# Patient Record
Sex: Female | Born: 1965
Health system: Southern US, Community
[De-identification: ages and names within clinical notes are randomized; demographics above are authoritative.]

## PROBLEM LIST (undated history)

## (undated) DIAGNOSIS — R079 Chest pain, unspecified: Secondary | ICD-10-CM

## (undated) DIAGNOSIS — M722 Plantar fascial fibromatosis: Secondary | ICD-10-CM

## (undated) DIAGNOSIS — N8110 Cystocele, unspecified: Secondary | ICD-10-CM

## (undated) DIAGNOSIS — R011 Cardiac murmur, unspecified: Secondary | ICD-10-CM

## (undated) DIAGNOSIS — G8929 Other chronic pain: Secondary | ICD-10-CM

## (undated) DIAGNOSIS — M549 Dorsalgia, unspecified: Secondary | ICD-10-CM

## (undated) DIAGNOSIS — I10 Essential (primary) hypertension: Secondary | ICD-10-CM

## (undated) DIAGNOSIS — K219 Gastro-esophageal reflux disease without esophagitis: Secondary | ICD-10-CM

## (undated) DIAGNOSIS — N393 Stress incontinence (female) (male): Secondary | ICD-10-CM

## (undated) DIAGNOSIS — F329 Major depressive disorder, single episode, unspecified: Secondary | ICD-10-CM

## (undated) DIAGNOSIS — F419 Anxiety disorder, unspecified: Secondary | ICD-10-CM

## (undated) DIAGNOSIS — D219 Benign neoplasm of connective and other soft tissue, unspecified: Secondary | ICD-10-CM

## (undated) DIAGNOSIS — E876 Hypokalemia: Secondary | ICD-10-CM

## (undated) DIAGNOSIS — Z87898 Personal history of other specified conditions: Secondary | ICD-10-CM

## (undated) DIAGNOSIS — Z5189 Encounter for other specified aftercare: Secondary | ICD-10-CM

## (undated) DIAGNOSIS — R51 Headache: Secondary | ICD-10-CM

## (undated) DIAGNOSIS — Z973 Presence of spectacles and contact lenses: Secondary | ICD-10-CM

## (undated) DIAGNOSIS — N816 Rectocele: Secondary | ICD-10-CM

## (undated) DIAGNOSIS — G4730 Sleep apnea, unspecified: Secondary | ICD-10-CM

## (undated) DIAGNOSIS — M1990 Unspecified osteoarthritis, unspecified site: Secondary | ICD-10-CM

## (undated) DIAGNOSIS — E559 Vitamin D deficiency, unspecified: Principal | ICD-10-CM

## (undated) HISTORY — DX: Anxiety disorder, unspecified: F41.9

## (undated) HISTORY — PX: KNEE SURGERY: SHX244

## (undated) HISTORY — DX: Chest pain, unspecified: R07.9

## (undated) HISTORY — DX: Major depressive disorder, single episode, unspecified: F32.9

## (undated) HISTORY — PX: OTHER SURGICAL HISTORY: SHX169

## (undated) HISTORY — PX: BILATERAL OOPHORECTOMY: SHX1221

## (undated) HISTORY — DX: Encounter for other specified aftercare: Z51.89

## (undated) HISTORY — DX: Benign neoplasm of connective and other soft tissue, unspecified: D21.9

## (undated) HISTORY — PX: HERNIA REPAIR: SHX51

## (undated) HISTORY — DX: Vitamin D deficiency, unspecified: E55.9

---

## 1988-03-03 HISTORY — PX: TUBAL LIGATION: SHX77

## 2000-03-03 HISTORY — PX: UMBILICAL HERNIA REPAIR: SHX196

## 2002-03-03 HISTORY — PX: LAPAROSCOPIC OOPHORECTOMY: SUR783

## 2005-02-19 ENCOUNTER — Inpatient Hospital Stay (HOSPITAL_COMMUNITY): Admission: AD | Admit: 2005-02-19 | Discharge: 2005-02-20 | Payer: Self-pay | Admitting: Obstetrics and Gynecology

## 2005-09-26 ENCOUNTER — Emergency Department (HOSPITAL_COMMUNITY): Admission: EM | Admit: 2005-09-26 | Discharge: 2005-09-26 | Payer: Self-pay | Admitting: Emergency Medicine

## 2006-06-16 ENCOUNTER — Encounter: Admission: RE | Admit: 2006-06-16 | Discharge: 2006-06-16 | Payer: Self-pay | Admitting: Obstetrics and Gynecology

## 2006-07-22 ENCOUNTER — Inpatient Hospital Stay (HOSPITAL_COMMUNITY): Admission: AD | Admit: 2006-07-22 | Discharge: 2006-07-22 | Payer: Self-pay | Admitting: Obstetrics and Gynecology

## 2007-08-16 ENCOUNTER — Emergency Department (HOSPITAL_COMMUNITY): Admission: EM | Admit: 2007-08-16 | Discharge: 2007-08-16 | Payer: Self-pay | Admitting: Emergency Medicine

## 2008-03-03 HISTORY — PX: KNEE ARTHROSCOPY: SUR90

## 2008-12-09 ENCOUNTER — Emergency Department (HOSPITAL_COMMUNITY): Admission: EM | Admit: 2008-12-09 | Discharge: 2008-12-09 | Payer: Self-pay | Admitting: Emergency Medicine

## 2009-01-29 ENCOUNTER — Emergency Department (HOSPITAL_COMMUNITY): Admission: EM | Admit: 2009-01-29 | Discharge: 2009-01-29 | Payer: Self-pay | Admitting: Family Medicine

## 2009-06-06 ENCOUNTER — Emergency Department (HOSPITAL_COMMUNITY): Admission: EM | Admit: 2009-06-06 | Discharge: 2009-06-07 | Payer: Self-pay | Admitting: Emergency Medicine

## 2009-12-21 ENCOUNTER — Emergency Department (HOSPITAL_COMMUNITY): Admission: EM | Admit: 2009-12-21 | Discharge: 2009-12-21 | Payer: Self-pay | Admitting: Family Medicine

## 2010-01-30 ENCOUNTER — Emergency Department (HOSPITAL_COMMUNITY)
Admission: EM | Admit: 2010-01-30 | Discharge: 2010-01-30 | Payer: Self-pay | Source: Home / Self Care | Admitting: Family Medicine

## 2010-03-22 ENCOUNTER — Emergency Department (HOSPITAL_COMMUNITY)
Admission: EM | Admit: 2010-03-22 | Discharge: 2010-03-22 | Payer: Self-pay | Source: Home / Self Care | Admitting: Family Medicine

## 2010-04-26 ENCOUNTER — Inpatient Hospital Stay (INDEPENDENT_AMBULATORY_CARE_PROVIDER_SITE_OTHER)
Admission: RE | Admit: 2010-04-26 | Discharge: 2010-04-26 | Disposition: A | Discharge status: Home / Self Care | Payer: Self-pay | Source: Ambulatory Visit | Attending: Family Medicine | Admitting: Family Medicine

## 2010-04-26 DIAGNOSIS — M519 Unspecified thoracic, thoracolumbar and lumbosacral intervertebral disc disorder: Secondary | ICD-10-CM

## 2010-04-26 DIAGNOSIS — I10 Essential (primary) hypertension: Secondary | ICD-10-CM

## 2010-05-15 LAB — POCT I-STAT, CHEM 8
BUN: 17 mg/dL (ref 6–23)
Chloride: 106 mEq/L (ref 96–112)
Potassium: 4.3 mEq/L (ref 3.5–5.1)
Sodium: 143 mEq/L (ref 135–145)

## 2010-05-22 LAB — COMPREHENSIVE METABOLIC PANEL
ALT: 16 U/L (ref 0–35)
Albumin: 3.4 g/dL — ABNORMAL LOW (ref 3.5–5.2)
Alkaline Phosphatase: 88 U/L (ref 39–117)
BUN: 14 mg/dL (ref 6–23)
Chloride: 102 mEq/L (ref 96–112)
GFR calc Af Amer: 59 mL/min — ABNORMAL LOW (ref >60)
Total Bilirubin: 0.6 mg/dL (ref 0.3–1.2)

## 2010-05-22 LAB — URINALYSIS, ROUTINE W REFLEX MICROSCOPIC
Bilirubin Urine: NEGATIVE
Glucose, UA: NEGATIVE mg/dL
Nitrite: NEGATIVE
pH: 6 (ref 5.0–8.0)

## 2010-05-22 LAB — CBC
HCT: 35.7 % — ABNORMAL LOW (ref 36.0–46.0)
Hemoglobin: 12.1 g/dL (ref 12.0–15.0)
MCHC: 34 g/dL (ref 30.0–36.0)
RBC: 3.64 MIL/uL — ABNORMAL LOW (ref 3.87–5.11)
WBC: 5.6 10*3/uL (ref 4.0–10.5)

## 2010-05-22 LAB — DIFFERENTIAL
Basophils Absolute: 0.3 10*3/uL — ABNORMAL HIGH (ref 0.0–0.1)
Basophils Relative: 5 % — ABNORMAL HIGH (ref 0–1)
Lymphs Abs: 2.4 10*3/uL (ref 0.7–4.0)
Monocytes Absolute: 0.6 10*3/uL (ref 0.1–1.0)
Neutro Abs: 2.2 10*3/uL (ref 1.7–7.7)
Neutrophils Relative %: 39 % — ABNORMAL LOW (ref 43–77)

## 2010-07-20 ENCOUNTER — Inpatient Hospital Stay (INDEPENDENT_AMBULATORY_CARE_PROVIDER_SITE_OTHER)
Admission: RE | Admit: 2010-07-20 | Discharge: 2010-07-20 | Disposition: A | Discharge status: Home / Self Care | Payer: Self-pay | Source: Ambulatory Visit | Attending: Family Medicine | Admitting: Family Medicine

## 2010-07-20 DIAGNOSIS — I10 Essential (primary) hypertension: Secondary | ICD-10-CM

## 2010-07-20 LAB — POCT I-STAT, CHEM 8
BUN: 15 mg/dL (ref 6–23)
Creatinine, Ser: 1 mg/dL (ref 0.4–1.2)
Glucose, Bld: 83 mg/dL (ref 70–99)
HCT: 39 % (ref 36.0–46.0)
Hemoglobin: 13.3 g/dL (ref 12.0–15.0)
TCO2: 27 mmol/L (ref 0–100)

## 2010-08-12 ENCOUNTER — Other Ambulatory Visit (HOSPITAL_COMMUNITY): Payer: Self-pay | Admitting: Family Medicine

## 2010-08-12 DIAGNOSIS — Z1231 Encounter for screening mammogram for malignant neoplasm of breast: Secondary | ICD-10-CM

## 2010-08-20 ENCOUNTER — Ambulatory Visit (HOSPITAL_COMMUNITY)
Admission: RE | Admit: 2010-08-20 | Discharge: 2010-08-20 | Disposition: A | Discharge status: Home / Self Care | Payer: Self-pay | Source: Ambulatory Visit | Attending: Family Medicine | Admitting: Family Medicine

## 2010-08-20 DIAGNOSIS — Z1231 Encounter for screening mammogram for malignant neoplasm of breast: Secondary | ICD-10-CM | POA: Insufficient documentation

## 2011-04-27 ENCOUNTER — Emergency Department (INDEPENDENT_AMBULATORY_CARE_PROVIDER_SITE_OTHER)
Admission: EM | Admit: 2011-04-27 | Discharge: 2011-04-27 | Disposition: A | Discharge status: Home / Self Care | Payer: Self-pay | Source: Home / Self Care | Attending: Family Medicine | Admitting: Family Medicine

## 2011-04-27 ENCOUNTER — Encounter (HOSPITAL_COMMUNITY): Payer: Self-pay | Admitting: *Deleted

## 2011-04-27 DIAGNOSIS — K029 Dental caries, unspecified: Secondary | ICD-10-CM

## 2011-04-27 DIAGNOSIS — K0500 Acute gingivitis, plaque induced: Secondary | ICD-10-CM

## 2011-04-27 HISTORY — DX: Essential (primary) hypertension: I10

## 2011-04-27 MED ORDER — CLINDAMYCIN HCL 150 MG PO CAPS: 300.0000 mg | ORAL_CAPSULE | Freq: Three times a day (TID) | ORAL | Status: AC

## 2011-04-27 MED ORDER — HYDROCODONE-ACETAMINOPHEN 5-325 MG PO TABS: 1.0000 | ORAL_TABLET | Freq: Three times a day (TID) | ORAL | Status: AC | PRN

## 2011-04-27 NOTE — Discharge Instructions (Signed)
Dental Caries  Tooth decay (dental caries, cavities) is the most common of all oral diseases. It occurs in all ages but is more common in children and young adults.  CAUSES  Bacteria in your mouth combine with foods (particularly sugars and starches) to produce plaque. Plaque is a substance that sticks to the hard surfaces of teeth. The bacteria in the plaque produce acids that attack the enamel of teeth. Repeated acid attacks dissolve the enamel and create holes in the teeth. Root surfaces of teeth may also get these holes.  Other contributing factors include:   Frequent snacking and drinking of cavity-producing foods and liquids.   Poor oral hygiene.   Dry mouth.   Substance abuse such as methamphetamine.   Broken or poor fitting dental restorations.   Eating disorders.   Gastroesophageal reflux disease (GERD).   Certain radiation treatments to the head and neck.  SYMPTOMS  At first, dental decay appears as white, chalky areas on the enamel. In this early stage, symptoms are seldom present. As the decay progresses, pits and holes may appear on the enamel surfaces. Progression of the decay will lead to softening of the hard layers of the tooth. At this point you may experience some pain or achy feeling after sweet, hot, or cold foods or drinks are consumed. If left untreated, the decay will reach the internal structures of the tooth and produce severe pain. Extensive dental treatment, such as root canal therapy, may be needed to save the tooth at this late stage of decay development.  DIAGNOSIS  Most cavities will be detected during regular check-ups. A thorough medical and dental history will be taken by the dentist. The dentist will use instruments to check the surfaces of your teeth for any breakdown or discoloration. Some dentists have special instruments, such as lasers, that detect tooth decay. Dental X-rays may also show some cavities that are not visible to the eye (such as between  the contact areas of the teeth). TREATMENT  Treatment involves removal of the tooth decay and replacement with a restorative material such as silver, gold, or composite (white) material. However, if the decay involves a large area of the tooth and there is little remaining healthy tooth structure, a cap (crown) will be fitted over the remaining structure. If the decay involves the center part of the tooth (pulp), root canal treatment will be needed before any type of dental restoration is placed. If the tooth is severely destroyed by the decay process, leaving the remaining tooth structures unrestorable, the tooth will need to be pulled (extracted). Some early tooth decay may be reversed by fluoride treatments and thorough brushing and flossing at home. PREVENTION   Eat healthy foods. Restrict the amount of sugary, starchy foods and liquids you consume. Avoid frequent snacking and drinking of unhealthy foods and liquids.   Sealants can help with prevention of cavities. Sealants are composite resins applied onto the biting surfaces of teeth at risk for decay. They smooth out the pits and grooves and prevent food from being trapped in them. This is done in early childhood before tooth decay has started.   Fluoride tablets may also be prescribed to children between 6 months and 10 years of age if your drinking water is not fluoridated. The fluoride absorbed by the tooth enamel makes teeth less susceptible to decay. Thorough daily cleaning with a toothbrush and dental floss is the best way to prevent cavities. Use of a fluoride toothpaste is highly recommended. Fluoride mouth rinses   may be used in specific cases.   Topical application of fluoride by your dentist is important in children.   Regular visits with a dentist for checkups and cleanings are also important.  SEEK IMMEDIATE DENTAL CARE IF:  You have a fever.   You develop redness and swelling of your face, jaw, or neck.   You develop swelling  around a tooth.   You are unable to open your mouth or cannot swallow.   You have severe pain uncontrolled by pain medicine.  Document Released: 11/09/2001 Document Revised: 10/30/2010 Document Reviewed: 07/25/2010 [facility] Patient Information 2012 [place], [place].Dental Pain Toothache is pain in or around a tooth. It may get worse with chewing or with cold or heat.  HOME CARE  Your dentist may use a numbing medicine during treatment. If so, you may need to avoid eating until the medicine wears off. Ask your dentist about this.   Only take medicine as told by your dentist or doctor.   Avoid chewing food near the painful tooth until after all treatment is done. Ask your dentist about this.  GET HELP RIGHT AWAY IF:   The problem gets worse or new problems appear.   You have a fever.   There is redness and puffiness (swelling) of the face, jaw, or neck.   You cannot open your mouth.   There is pain in the jaw.   There is very bad pain that is not helped by medicine.  MAKE SURE YOU:   Understand these instructions.   Will watch your condition.   Will get help right away if you are not doing well or get worse.  Document Released: 08/06/2007 Document Revised: 10/30/2010 Document Reviewed: 08/06/2007 [facility] Patient Information 2012 [place], [place].Dental Pain A tooth ache may be caused by cavities (tooth decay). Cavities expose the nerve of the tooth to air and hot or cold temperatures. It may come from an infection or abscess (also called a boil or furuncle) around your tooth. It is also often caused by dental caries (tooth decay). This causes the pain you are having. DIAGNOSIS  Your caregiver can diagnose this problem by exam. TREATMENT   If caused by an infection, it may be treated with medications which kill germs (antibiotics) and pain medications as prescribed by your caregiver. Take medications as directed.   Only take over-the-counter or prescription medicines for pain,  discomfort, or fever as directed by your caregiver.   Whether the tooth ache today is caused by infection or dental disease, you should see your dentist as soon as possible for further care.  SEEK MEDICAL CARE IF: The exam and treatment you received today has been provided on an emergency basis only. This is not a substitute for complete medical or dental care. If your problem worsens or new problems (symptoms) appear, and you are unable to meet with your dentist, call or return to this location. SEEK IMMEDIATE MEDICAL CARE IF:   You have a fever.   You develop redness and swelling of your face, jaw, or neck.   You are unable to open your mouth.   You have severe pain uncontrolled by pain medicine.  MAKE SURE YOU:   Understand these instructions.   Will watch your condition.   Will get help right away if you are not doing well or get worse.  Document Released: 02/17/2005 Document Revised: 10/30/2010 Document Reviewed: 10/06/2007 [facility] Patient Information 2012 [place], [place].Gingivitis Gingivitis is a form of gum (periodontal) disease that causes redness, soreness, and  swelling (inflammation) of your gums. CAUSES The most common cause of gingivitis is poor oral hygiene. A sticky substance made of bacteria, mucus, and food particles (plaque), is deposited on the exposed part of teeth. As plaque builds up, it reacts with the saliva in your mouth to form something called  tartar. Tartar is a hard deposit that becomes trapped around the base of the tooth. Plaque and tartar irritate the gums, leading to the formation of gingivitis. Other factors that increase your risk for gingivitis include:   Tobacco use.   Diabetes.   Older age.   Certain medications.   Certain viral or fungal infections.   Dry mouth.   Hormonal changes such as during pregnancy.   Poor nutrition.   Substance abuse.   Poor fitting dental restorations or appliances.  SYMPTOMS You may notice inflammation  of the soft tissue (gingiva) around the teeth. When these tissues become inflamed, they bleed easily, especially during flossing or brushing. The gums may also be:   Tender to the touch.   Bright red, purple red, or have a shiny appearance.   Swollen.   Wearing away from the teeth (receding), which exposes more of the tooth.  Bad breath is often present. Continued infection around teeth can eventually cause cavities and loosen teeth. This may lead to eventual tooth loss. DIAGNOSIS A medical and dental history will be taken. Your mouth, teeth, and gums will be examined. Your dentist will look for soft, swollen purple-red, irritated gums. There may be deposits of plaque and tartar at the base of the teeth. Your gums will be looked at for the degree of redness, puffiness, and bleeding tendencies. Your dentist will see if any of the teeth are loose. X-rays may be taken to see if the inflammation has spread to the supporting structures of the teeth. TREATMENT The goal is to reduce and reverse the inflammation. Proper treatment can usually reverse the symptoms of gingivitis and prevent further progression of the disease. Have your teeth cleaned. During the cleaning, all plaque and tartar will be removed. Instruction for proper home care will be given. You will need regular professional cleanings and check-ups in the future. HOME CARE INSTRUCTIONS  Brush your teeth twice a day and floss at least once per day. When flossing, it is best to floss first then brush.   Limit sugar between meals and maintain a well-balanced diet.   Even the best dental hygiene will not prevent plaque from developing. It is necessary for you to see your dentist on a regular basis for cleaning and regular checkups.   Your dentist can recommend proper oral hygiene and mouth care and suggest special toothpastes or mouth rinses.   Stop smoking.  SEEK DENTAL OR MEDICAL CARE IF:  You have painful, reddened tissue around your  teeth, or you have puffy swollen gums.   You have difficulty chewing.   You notice any loose or infected teeth.   You have swollen glands.   Your gums bleed easily when you brush your teeth or are very tender to the touch.  Document Released: 08/13/2000 Document Revised: 10/30/2010 Document Reviewed: 05/24/2010 [facility] Patient Information 2012 [place], [place].

## 2011-04-27 NOTE — ED Notes (Signed)
Left upper toothache onset Friday night - increasingly worse

## 2011-04-27 NOTE — ED Provider Notes (Signed)
History     CSN: [id number]  Arrival date & time 04/27/11  1029   First MD Initiated Contact with Patient 04/27/11 1046      Chief Complaint  Patient presents with  . Dental Pain    (Consider location/radiation/quality/duration/timing/severity/associated sxs/prior treatment) Patient is a 46 y.o. female presenting with tooth pain. The history is provided by the patient.  Dental PainThe primary symptoms include mouth pain. The symptoms began more than 1 month ago. The symptoms are worsening. The symptoms occur constantly.  Additional symptoms include: dental sensitivity to temperature, gum swelling, gum tenderness, jaw pain, facial swelling, excessive salivation and ear pain. Additional symptoms do not include: trouble swallowing, pain with swallowing, dry mouth, smell disturbance, hearing loss, swollen glands, goiter and fatigue. Medical issues include: periodontal disease. Medical issues do not include: alcohol problem, smoking, chewing tobacco, immunosuppression and cancer.    Past Medical History  Diagnosis Date  . Hypertension     Past Surgical History  Procedure Date  . Cesarean section   . Hernia repair   . Bilateral oophorectomy   . Knee surgery     History reviewed. No pertinent family history.  History  Substance Use Topics  . Smoking status: Never Smoker   . Smokeless tobacco: Not on file  . Alcohol Use: No    OB History    Grav Para Term Preterm Abortions TAB SAB Ect Mult Living                  Review of Systems  Constitutional: Negative for fatigue.  HENT: Positive for ear pain and facial swelling. Negative for hearing loss and trouble swallowing.   All other systems reviewed and are negative.    Allergies  Penicillins  Home Medications   Current Outpatient Rx  Name Route Sig Dispense Refill  . IBUPROFEN 800 MG PO TABS Oral Take 800 mg by mouth every 8 (eight) hours as needed.    Marland Kitchen LISINOPRIL PO Oral Take by mouth.      BP 132/89  Pulse  62  Temp(Src) 98.8 F (37.1 C) (Oral)  Resp 18  SpO2 98%  Physical Exam  Constitutional: She is oriented to person, place, and time. She appears well-nourished.  HENT:  Head: Normocephalic.  Right Ear: External ear normal.  Left Ear: External ear normal.  Mouth/Throat: Oral lesions present. Abnormal dentition. Dental abscesses and dental caries present.         Multiple dental caries in the upper L gum  Neck: Neck supple.  Musculoskeletal: Normal range of motion.  Lymphadenopathy:    She has cervical adenopathy.       Right cervical: Superficial cervical adenopathy present.       Left cervical: Superficial cervical adenopathy present.  Neurological: She is alert and oriented to person, place, and time.  Psychiatric: She has a normal mood and affect.    ED Course  Procedures (including critical care time)   Dental carries w/gingivitis  MDM          Hassan Rowan, MD 04/27/11 2156

## 2011-05-08 ENCOUNTER — Encounter (HOSPITAL_COMMUNITY): Payer: Self-pay

## 2011-05-08 ENCOUNTER — Other Ambulatory Visit: Payer: Self-pay | Admitting: Obstetrics and Gynecology

## 2011-05-09 ENCOUNTER — Encounter (HOSPITAL_COMMUNITY)
Admission: RE | Admit: 2011-05-09 | Discharge: 2011-05-09 | Disposition: A | Discharge status: Home / Self Care | Payer: BC Managed Care – PPO | Source: Ambulatory Visit | Attending: Obstetrics and Gynecology | Admitting: Obstetrics and Gynecology

## 2011-05-09 ENCOUNTER — Other Ambulatory Visit: Payer: Self-pay

## 2011-05-09 ENCOUNTER — Encounter (HOSPITAL_COMMUNITY): Payer: Self-pay

## 2011-05-09 DIAGNOSIS — Z01818 Encounter for other preprocedural examination: Secondary | ICD-10-CM | POA: Insufficient documentation

## 2011-05-09 HISTORY — DX: Encounter for other specified aftercare: Z51.89

## 2011-05-09 HISTORY — DX: Sleep apnea, unspecified: G47.30

## 2011-05-09 HISTORY — DX: Headache: R51

## 2011-05-09 HISTORY — DX: Plantar fascial fibromatosis: M72.2

## 2011-05-09 HISTORY — DX: Unspecified osteoarthritis, unspecified site: M19.90

## 2011-05-09 LAB — CBC
Hemoglobin: 12.1 g/dL (ref 12.0–15.0)
Platelets: 283 10*3/uL (ref 150–400)
RDW: 13 % (ref 11.5–15.5)
WBC: 5.2 10*3/uL (ref 4.0–10.5)

## 2011-05-09 LAB — BASIC METABOLIC PANEL
Calcium: 8.9 mg/dL (ref 8.4–10.5)
Chloride: 100 mEq/L (ref 96–112)
Creatinine, Ser: 0.92 mg/dL (ref 0.50–1.10)
GFR calc non Af Amer: 73 mL/min — ABNORMAL LOW (ref >90)
Potassium: 3.6 mEq/L (ref 3.5–5.1)

## 2011-05-09 NOTE — Pre-Procedure Instructions (Signed)
OK to see patient DOS.  Patient instructed to bring CPAP with her DOS.

## 2011-05-09 NOTE — Patient Instructions (Addendum)
   Your procedure is scheduled on: Friday, March 15th  Enter through the Main Entrance of [facility] at: 11:30am Pick up the phone at the desk and dial [phone] and inform us of your arrival.  Please call this number if you have any problems the morning of surgery: [phone]  Remember: Do not eat food after midnight: Thursday Do not drink clear liquids after: 9am Friday Take these medicines the morning of surgery with a SIP OF WATER: lisinopril-hydrochlorothiazide   Do not wear jewelry, make-up, or FINGER nail polish Do not wear lotions, powders, perfumes or deodorant. Do not shave 48 hours prior to surgery. Do not bring valuables to the hospital. Contacts, dentures or bridgework may not be worn into surgery.   Patients discharged on the day of surgery will not be allowed to drive home.  Home with Sister Valentina Gu or Daughter Candee Furbish    Remember to use your hibiclens as instructed.Please shower with 1/2 bottle the evening before your surgery and the other 1/2 bottle the morning of surgery. Neck down avoiding private area.

## 2011-05-09 NOTE — Pre-Procedure Instructions (Signed)
Informed Sangita in lab at [facility] of patient's history of blood transfusion of 5 units in [place], [place] with C/S surgery-1990.

## 2011-05-16 ENCOUNTER — Encounter (HOSPITAL_COMMUNITY): Admission: RE | Payer: Self-pay | Source: Ambulatory Visit

## 2011-05-16 ENCOUNTER — Ambulatory Visit (HOSPITAL_COMMUNITY)
Admission: RE | Admit: 2011-05-16 | Payer: BC Managed Care – PPO | Source: Ambulatory Visit | Admitting: Obstetrics and Gynecology

## 2011-05-16 SURGERY — DILATATION & CURETTAGE/HYSTEROSCOPY WITH VERSAPOINT RESECTION
Anesthesia: General

## 2011-09-09 ENCOUNTER — Emergency Department (HOSPITAL_COMMUNITY): Payer: BC Managed Care – PPO

## 2011-09-09 ENCOUNTER — Emergency Department (HOSPITAL_COMMUNITY)
Admission: EM | Admit: 2011-09-09 | Discharge: 2011-09-09 | Disposition: A | Discharge status: Home / Self Care | Payer: BC Managed Care – PPO | Attending: Emergency Medicine | Admitting: Emergency Medicine

## 2011-09-09 ENCOUNTER — Encounter (HOSPITAL_COMMUNITY): Payer: Self-pay | Admitting: Emergency Medicine

## 2011-09-09 DIAGNOSIS — I10 Essential (primary) hypertension: Secondary | ICD-10-CM | POA: Insufficient documentation

## 2011-09-09 DIAGNOSIS — M129 Arthropathy, unspecified: Secondary | ICD-10-CM | POA: Insufficient documentation

## 2011-09-09 DIAGNOSIS — R51 Headache: Secondary | ICD-10-CM

## 2011-09-09 DIAGNOSIS — R5383 Other fatigue: Secondary | ICD-10-CM | POA: Insufficient documentation

## 2011-09-09 DIAGNOSIS — Z79899 Other long term (current) drug therapy: Secondary | ICD-10-CM | POA: Insufficient documentation

## 2011-09-09 DIAGNOSIS — R29898 Other symptoms and signs involving the musculoskeletal system: Secondary | ICD-10-CM | POA: Insufficient documentation

## 2011-09-09 DIAGNOSIS — R5381 Other malaise: Secondary | ICD-10-CM | POA: Insufficient documentation

## 2011-09-09 DIAGNOSIS — R209 Unspecified disturbances of skin sensation: Secondary | ICD-10-CM | POA: Insufficient documentation

## 2011-09-09 LAB — COMPREHENSIVE METABOLIC PANEL
AST: 24 U/L (ref 0–37)
Albumin: 3.3 g/dL — ABNORMAL LOW (ref 3.5–5.2)
Alkaline Phosphatase: 83 U/L (ref 39–117)
BUN: 14 mg/dL (ref 6–23)
CO2: 27 mEq/L (ref 19–32)
Chloride: 107 mEq/L (ref 96–112)
GFR calc non Af Amer: 65 mL/min — ABNORMAL LOW (ref >90)
Potassium: 4 mEq/L (ref 3.5–5.1)
Total Bilirubin: 0.3 mg/dL (ref 0.3–1.2)

## 2011-09-09 LAB — CBC WITH DIFFERENTIAL/PLATELET
Basophils Absolute: 0 10*3/uL (ref 0.0–0.1)
Eosinophils Absolute: 0.2 10*3/uL (ref 0.0–0.7)
Eosinophils Relative: 4 % (ref 0–5)
MCH: 31.5 pg (ref 26.0–34.0)
MCV: 95.9 fL (ref 78.0–100.0)
Platelets: 236 10*3/uL (ref 150–400)
RDW: 13.2 % (ref 11.5–15.5)
WBC: 5 10*3/uL (ref 4.0–10.5)

## 2011-09-09 LAB — URINALYSIS, ROUTINE W REFLEX MICROSCOPIC
Ketones, ur: NEGATIVE mg/dL
Leukocytes, UA: NEGATIVE
Nitrite: NEGATIVE
Protein, ur: NEGATIVE mg/dL
Urobilinogen, UA: 0.2 mg/dL (ref 0.0–1.0)

## 2011-09-09 MED ORDER — LISINOPRIL-HYDROCHLOROTHIAZIDE 20-25 MG PO TABS: 1.0000 | ORAL_TABLET | Freq: Every day | ORAL | Status: DC

## 2011-09-09 NOTE — ED Notes (Signed)
Patient states have not taken blood pressure medication because ran out and does not have a primary Doctor.  Developed increased edema bilateral lower extremities +3 pedal pulses +1 bilateral skin warm dry full sensation.  States right upper extremity weakness 3 days ago and a headache today 3/10 achy dull throbbing currently. Clear speech answering and following commands appropriate.  Ax4.

## 2011-09-09 NOTE — ED Provider Notes (Signed)
History     CSN: [id number]  Arrival date & time 09/09/11  1438   First MD Initiated Contact with Patient 09/09/11 1545      Chief Complaint  Patient presents with  . Weakness  . Headache     HPI Pt has been having swelling in her legs for a while and today she developed a headache and noticed some tingling in her right arm.  Pt is concerned because she has not been on her medications because she has not been able to find a primary doctor because of insurance.  The headache was in the front and back.  She has history of headaches.  No history of stroke.  NO fever.  No trouble with her speech or coordination.  The headache has resolved but she still does have some stiffness in her neck.  The leg swelling has been off and on for a week.  No chest pain or shortness of breath.  No history of DVT. She has had trouble with leg swelling in the past.  Past Medical History  Diagnosis Date  . Hypertension   . Sleep apnea     CPAP  . Blood transfusion 1990    In [place], [place] with C/S Surgery - 5 units transfused  . Headache     otc med prn  . Plantar fasciitis of left foot   . Arthritis     osteoarthritis - knees    Past Surgical History  Procedure Date  . Cesarean section 1990    x 1  . Hernia repair   . Bilateral oophorectomy   . Knee surgery     right knee  . Svd     x 2    History reviewed. No pertinent family history.  History  Substance Use Topics  . Smoking status: Never Smoker   . Smokeless tobacco: Never Used  . Alcohol Use: No    OB History    Grav Para Term Preterm Abortions TAB SAB Ect Mult Living                  Review of Systems  Allergies  Penicillins  Home Medications   Current Outpatient Rx  Name Route Sig Dispense Refill  . LISINOPRIL-HYDROCHLOROTHIAZIDE 20-25 MG PO TABS Oral Take 1 tablet by mouth daily.    . MELOXICAM 7.5 MG PO TABS Oral Take 7.5 mg by mouth daily.      BP 133/89  Pulse 66  Temp 98.7 F (37.1 C) (Oral)  Resp 16   SpO2 100%  Physical Exam  Nursing note and vitals reviewed. Constitutional: She is oriented to person, place, and time. She appears well-developed and well-nourished. No distress.  HENT:  Head: Normocephalic and atraumatic.  Right Ear: External ear normal.  Left Ear: External ear normal.  Mouth/Throat: Oropharynx is clear and moist.  Eyes: Conjunctivae are normal. Right eye exhibits no discharge. Left eye exhibits no discharge. No scleral icterus.  Neck: Neck supple. No tracheal deviation present.       Neck supple without meningeal signs  Cardiovascular: Normal rate, regular rhythm and intact distal pulses.   Pulmonary/Chest: Effort normal and breath sounds normal. No stridor. No respiratory distress. She has no wheezes. She has no rales.  Abdominal: Soft. Bowel sounds are normal. She exhibits no distension. There is no tenderness. There is no rebound and no guarding.  Musculoskeletal: She exhibits no edema and no tenderness.  Neurological: She is alert and oriented to person, place, and time. She  has normal strength. No cranial nerve deficit ( no gross defecits noted) or sensory deficit. She exhibits normal muscle tone. She displays no seizure activity. Coordination normal.       No pronator drift bilateral upper extrem, able to hold both legs off bed for 5 seconds, sensation intact in all extremities, no visual field cuts, no left or right sided neglect  Skin: Skin is warm and dry. No rash noted.  Psychiatric: She has a normal mood and affect.    ED Course  Procedures (including critical care time)  Labs Reviewed  COMPREHENSIVE METABOLIC PANEL - Abnormal; Notable for the following:    Albumin 3.3 (*)     GFR calc non Af Amer 65 (*)     GFR calc Af Amer 76 (*)     All other components within normal limits  CBC WITH DIFFERENTIAL - Abnormal; Notable for the following:    RBC 3.68 (*)     Hemoglobin 11.6 (*)     HCT 35.3 (*)     Neutrophils Relative 38 (*)     Lymphocytes Relative  48 (*)     All other components within normal limits  URINALYSIS, ROUTINE W REFLEX MICROSCOPIC   Dg Chest 2 View  09/09/2011  *RADIOLOGY REPORT*  Clinical Data: Weakness and headache  CHEST - 2 VIEW  Comparison: Chest radiograph 06/06/2009  Findings: Normal mediastinum and cardiac silhouette.  Normal pulmonary  vasculature.  No evidence of effusion, infiltrate, or pneumothorax.  No acute bony abnormality.  IMPRESSION: No acute cardiopulmonary process.  Original Report Authenticated By: Genevive Bi, M.D.   Ct Head Wo Contrast  09/09/2011  *RADIOLOGY REPORT*  Clinical Data: Frontal headache.  Intermittent right arm weakness.  CT HEAD WITHOUT CONTRAST  Technique:  Contiguous axial images were obtained from the base of the skull through the vertex without contrast.  Comparison: 08/16/2007 There is no evidence for acute infarction, intracranial hemorrhage, mass lesion, hydrocephalus, or extra-axial fluid.  Findings: There is no evidence for acute infarction, intracranial hemorrhage, mass lesion, hydrocephalus, or extra-axial fluid. There may be slight cerebellar atrophy, nonspecific given the appearance of the superior vermian cistern which appears to be moderately prominent.  No cerebral cortical atrophy.  No significant white matter disease.  Calvarium intact.  Clear sinuses and mastoids.  Similar appearance to 2009.  IMPRESSION: Question slight cerebellar atrophy, otherwise negative.  This appearance is stable from priors.  Original Report Authenticated By: Elsie Stain, M.D.       MDM  Doubt CVA, SAH.  Symptoms are mild.  .  No neuro deficits on exam.  ? Paresthesia or nerve impingement. She does have history of headaches.  Pt is feeling well at this timeWill refill her BP medications.   Referral to PCP.  Pt encouraged to return for worsening symptoms, fever, neurologic symptoms.       Celene Kras, MD 09/09/11 1726

## 2011-09-09 NOTE — ED Notes (Signed)
Called for pt in waiting room for placement in a room with no response 3x.

## 2011-09-09 NOTE — ED Notes (Signed)
Pt c/o frontal HA and pain in neck; pt c/o intermittent right arm weakness x several days; mild weaker grip strength in right arm noted; not other sx noted; pt sts out of htn meds x 2.5 weeks

## 2012-05-26 ENCOUNTER — Other Ambulatory Visit (HOSPITAL_COMMUNITY): Payer: Self-pay | Admitting: Internal Medicine

## 2012-05-26 ENCOUNTER — Other Ambulatory Visit: Payer: Self-pay | Admitting: Physical Therapy

## 2012-05-26 DIAGNOSIS — Z1231 Encounter for screening mammogram for malignant neoplasm of breast: Secondary | ICD-10-CM

## 2012-06-01 ENCOUNTER — Ambulatory Visit (HOSPITAL_COMMUNITY)
Admission: RE | Admit: 2012-06-01 | Discharge: 2012-06-01 | Disposition: A | Discharge status: Home / Self Care | Payer: BC Managed Care – PPO | Source: Ambulatory Visit | Attending: Internal Medicine | Admitting: Internal Medicine

## 2012-06-01 DIAGNOSIS — Z1231 Encounter for screening mammogram for malignant neoplasm of breast: Secondary | ICD-10-CM | POA: Insufficient documentation

## 2012-07-17 ENCOUNTER — Emergency Department (HOSPITAL_COMMUNITY)
Admission: EM | Admit: 2012-07-17 | Discharge: 2012-07-17 | Disposition: A | Discharge status: Home / Self Care | Payer: BC Managed Care – PPO | Attending: Emergency Medicine | Admitting: Emergency Medicine

## 2012-07-17 ENCOUNTER — Encounter (HOSPITAL_COMMUNITY): Payer: Self-pay | Admitting: *Deleted

## 2012-07-17 DIAGNOSIS — IMO0002 Reserved for concepts with insufficient information to code with codable children: Secondary | ICD-10-CM | POA: Insufficient documentation

## 2012-07-17 DIAGNOSIS — Z9889 Other specified postprocedural states: Secondary | ICD-10-CM | POA: Insufficient documentation

## 2012-07-17 DIAGNOSIS — I10 Essential (primary) hypertension: Secondary | ICD-10-CM | POA: Insufficient documentation

## 2012-07-17 DIAGNOSIS — Z79899 Other long term (current) drug therapy: Secondary | ICD-10-CM | POA: Insufficient documentation

## 2012-07-17 DIAGNOSIS — G8929 Other chronic pain: Secondary | ICD-10-CM | POA: Insufficient documentation

## 2012-07-17 DIAGNOSIS — Z8669 Personal history of other diseases of the nervous system and sense organs: Secondary | ICD-10-CM | POA: Insufficient documentation

## 2012-07-17 DIAGNOSIS — Z8739 Personal history of other diseases of the musculoskeletal system and connective tissue: Secondary | ICD-10-CM | POA: Insufficient documentation

## 2012-07-17 DIAGNOSIS — R52 Pain, unspecified: Secondary | ICD-10-CM | POA: Insufficient documentation

## 2012-07-17 DIAGNOSIS — M1710 Unilateral primary osteoarthritis, unspecified knee: Secondary | ICD-10-CM | POA: Insufficient documentation

## 2012-07-17 DIAGNOSIS — G4730 Sleep apnea, unspecified: Secondary | ICD-10-CM | POA: Insufficient documentation

## 2012-07-17 DIAGNOSIS — M549 Dorsalgia, unspecified: Secondary | ICD-10-CM | POA: Insufficient documentation

## 2012-07-17 DIAGNOSIS — Z880 Allergy status to penicillin: Secondary | ICD-10-CM | POA: Insufficient documentation

## 2012-07-17 HISTORY — DX: Dorsalgia, unspecified: M54.9

## 2012-07-17 HISTORY — DX: Other chronic pain: G89.29

## 2012-07-17 MED ORDER — CYCLOBENZAPRINE HCL 10 MG PO TABS
10.0000 mg | ORAL_TABLET | Freq: Two times a day (BID) | ORAL | Status: DC | PRN
Start: 2012-07-17 — End: 2013-09-30

## 2012-07-17 MED ORDER — KETOROLAC TROMETHAMINE 60 MG/2ML IM SOLN
60.0000 mg | Freq: Once | INTRAMUSCULAR | Status: AC
  Administered 2012-07-17: 60 mg via INTRAMUSCULAR
  Filled 2012-07-17: qty 2

## 2012-07-17 MED ORDER — HYDROCODONE-ACETAMINOPHEN 5-325 MG PO TABS
2.0000 | ORAL_TABLET | Freq: Once | ORAL | Status: AC
  Administered 2012-07-17: 2 via ORAL
  Filled 2012-07-17: qty 2

## 2012-07-17 MED ORDER — HYDROCODONE-ACETAMINOPHEN 5-325 MG PO TABS: 2.0000 | ORAL_TABLET | ORAL | Status: DC | PRN

## 2012-07-17 MED ORDER — DIAZEPAM 5 MG/ML IJ SOLN
5.0000 mg | Freq: Once | INTRAMUSCULAR | Status: AC
  Administered 2012-07-17: 5 mg via INTRAMUSCULAR
  Filled 2012-07-17: qty 2

## 2012-07-17 NOTE — ED Notes (Signed)
PT reports increased pain to lower back and Rt hip that started today . Pt has a Hx of chronic back pain due to DDD and bulging  Disc  In back.

## 2012-07-17 NOTE — ED Provider Notes (Signed)
History     CSN: [id number]  Arrival date & time 07/17/12  1238   First MD Initiated Contact with Patient 07/17/12 1242      No chief complaint on file.   (Consider location/radiation/quality/duration/timing/severity/associated sxs/prior treatment) HPI Comments: Patient presents to the ER for evaluation of back pain. Patient reports a history of chronic back pain secondary to "3 bulging discs". She says that she was previously treated by a back surgeon with injections and pain medicine, but lost her job and insurance, has not followed up. Over the last week to week and a half she has had increasing pain in the left side of her back with intermittent spasms. Pain is significantly worsened by movement of the back. Pain does not radiate to the leg. No change in bowel or bladder function. No weakness, numbness or tingling.   Past Medical History  Diagnosis Date  . Hypertension   . Sleep apnea     CPAP  . Blood transfusion 1990    In [place], [place] with C/S Surgery - 5 units transfused  . Headache     otc med prn  . Plantar fasciitis of left foot   . Arthritis     osteoarthritis - knees    Past Surgical History  Procedure Laterality Date  . Cesarean section  1990    x 1  . Hernia repair    . Bilateral oophorectomy    . Knee surgery      right knee  . Svd      x 2    No family history on file.  History  Substance Use Topics  . Smoking status: Never Smoker   . Smokeless tobacco: Never Used  . Alcohol Use: No    OB History   Grav Para Term Preterm Abortions TAB SAB Ect Mult Living                  Review of Systems  Genitourinary: Negative.   Musculoskeletal: Positive for back pain.  Neurological: Negative.     Allergies  Penicillins  Home Medications   Current Outpatient Rx  Name  Route  Sig  Dispense  Refill  . lisinopril-hydrochlorothiazide (PRINZIDE,ZESTORETIC) 20-25 MG per tablet   Oral   Take 1 tablet by mouth daily.   30 tablet   3   .  meloxicam (MOBIC) 7.5 MG tablet   Oral   Take 7.5 mg by mouth daily.           BP 94/65  Pulse 86  Temp(Src) 98 F (36.7 C) (Oral)  Resp 16  SpO2 99%  Physical Exam  Constitutional: She is oriented to person, place, and time. She appears well-developed and well-nourished. No distress.  HENT:  Head: Normocephalic and atraumatic.  Right Ear: Hearing normal.  Left Ear: Hearing normal.  Nose: Nose normal.  Mouth/Throat: Oropharynx is clear and moist and mucous membranes are normal.  Eyes: Conjunctivae and EOM are normal. Pupils are equal, round, and reactive to light.  Neck: Normal range of motion. Neck supple.  Cardiovascular: Regular rhythm, S1 normal and S2 normal.  Exam reveals no gallop and no friction rub.   No murmur heard. Pulmonary/Chest: Effort normal and breath sounds normal. No respiratory distress. She exhibits no tenderness.  Abdominal: Soft. Normal appearance and bowel sounds are normal. There is no hepatosplenomegaly. There is no tenderness. There is no rebound, no guarding, no tenderness at McBurney's point and negative Murphy's sign. No hernia.  Musculoskeletal: Normal range of motion.  Neurological: She is alert and oriented to person, place, and time. She has normal strength. No cranial nerve deficit or sensory deficit. Coordination normal. GCS eye subscore is 4. GCS verbal subscore is 5. GCS motor subscore is 6.  Reflex Scores:      Patellar reflexes are 0 on the right side and 0 on the left side. Skin: Skin is warm, dry and intact. No rash noted. No cyanosis.  Psychiatric: She has a normal mood and affect. Her speech is normal and behavior is normal. Thought content normal.    ED Course  Procedures (including critical care time)  Labs Reviewed - No data to display No results found.   Diagnosis: Acute exacerbation of chronic back pain    MDM  Patient presents to the ER with musculoskeletal back pain. Examination reveals back tenderness without any  associated neurologic findings. Patient's strength, sensation and reflexes were normal. As such, patient did not require any imaging or further studies. Patient was treated with analgesia.   Gilda Crease, MD 07/17/12 1254

## 2013-05-16 ENCOUNTER — Emergency Department (HOSPITAL_COMMUNITY)
Admission: EM | Admit: 2013-05-16 | Discharge: 2013-05-16 | Disposition: A | Discharge status: Home / Self Care | Payer: BC Managed Care – PPO | Attending: Emergency Medicine | Admitting: Emergency Medicine

## 2013-05-16 ENCOUNTER — Encounter (HOSPITAL_COMMUNITY): Payer: Self-pay | Admitting: Emergency Medicine

## 2013-05-16 DIAGNOSIS — Z79899 Other long term (current) drug therapy: Secondary | ICD-10-CM | POA: Insufficient documentation

## 2013-05-16 DIAGNOSIS — G8929 Other chronic pain: Secondary | ICD-10-CM | POA: Insufficient documentation

## 2013-05-16 DIAGNOSIS — Z880 Allergy status to penicillin: Secondary | ICD-10-CM | POA: Insufficient documentation

## 2013-05-16 DIAGNOSIS — Z9889 Other specified postprocedural states: Secondary | ICD-10-CM | POA: Insufficient documentation

## 2013-05-16 DIAGNOSIS — G4730 Sleep apnea, unspecified: Secondary | ICD-10-CM | POA: Insufficient documentation

## 2013-05-16 DIAGNOSIS — IMO0002 Reserved for concepts with insufficient information to code with codable children: Secondary | ICD-10-CM | POA: Insufficient documentation

## 2013-05-16 DIAGNOSIS — Z791 Long term (current) use of non-steroidal anti-inflammatories (NSAID): Secondary | ICD-10-CM | POA: Insufficient documentation

## 2013-05-16 DIAGNOSIS — M5417 Radiculopathy, lumbosacral region: Secondary | ICD-10-CM

## 2013-05-16 DIAGNOSIS — M1710 Unilateral primary osteoarthritis, unspecified knee: Secondary | ICD-10-CM | POA: Insufficient documentation

## 2013-05-16 DIAGNOSIS — I10 Essential (primary) hypertension: Secondary | ICD-10-CM | POA: Insufficient documentation

## 2013-05-16 LAB — URINALYSIS, ROUTINE W REFLEX MICROSCOPIC
Bilirubin Urine: NEGATIVE
Glucose, UA: NEGATIVE mg/dL
Hgb urine dipstick: NEGATIVE
Ketones, ur: NEGATIVE mg/dL
Leukocytes, UA: NEGATIVE
Nitrite: NEGATIVE
Protein, ur: NEGATIVE mg/dL
Specific Gravity, Urine: 1.022 (ref 1.005–1.030)
Urobilinogen, UA: 0.2 mg/dL (ref 0.0–1.0)
pH: 5.5 (ref 5.0–8.0)

## 2013-05-16 MED ORDER — DEXAMETHASONE SODIUM PHOSPHATE 10 MG/ML IJ SOLN
10.0000 mg | Freq: Once | INTRAMUSCULAR | Status: AC
Start: 2013-05-16 — End: 2013-05-16
  Administered 2013-05-16: 10 mg via INTRAMUSCULAR
  Filled 2013-05-16: qty 1

## 2013-05-16 MED ORDER — OXYCODONE-ACETAMINOPHEN 5-325 MG PO TABS: 1.0000 | ORAL_TABLET | ORAL | Status: DC | PRN

## 2013-05-16 MED ORDER — CYCLOBENZAPRINE HCL 10 MG PO TABS: 10.0000 mg | ORAL_TABLET | Freq: Two times a day (BID) | ORAL | Status: DC | PRN

## 2013-05-16 MED ORDER — NAPROXEN 500 MG PO TABS: 500.0000 mg | ORAL_TABLET | Freq: Two times a day (BID) | ORAL | Status: DC

## 2013-05-16 MED ORDER — OXYCODONE-ACETAMINOPHEN 5-325 MG PO TABS
1.0000 | ORAL_TABLET | Freq: Once | ORAL | Status: AC
  Administered 2013-05-16: 1 via ORAL
  Filled 2013-05-16: qty 1

## 2013-05-16 NOTE — ED Notes (Signed)
Per pt, c/o back pain to lower back.  Pt states feels numb to touch.  Pt states no difficulty urinating.

## 2013-05-16 NOTE — ED Provider Notes (Signed)
Medical screening examination/treatment/procedure(s) were performed by non-physician practitioner and as supervising physician I was immediately available for consultation/collaboration.   EKG Interpretation None        Saddie Benders. Dorna Mai, MD 05/16/13 2307

## 2013-05-16 NOTE — ED Provider Notes (Signed)
CSN: [id number]     Arrival date & time 05/16/13  1733 History   First MD Initiated Contact with Patient 05/16/13 2106     Chief Complaint  Patient presents with  . Back Pain     (Consider location/radiation/quality/duration/timing/severity/associated sxs/prior Treatment) HPI Hailey Vance is a 48 y.o. female who presents to emergency department complaining of left lower back pain. Patient states she has history of lower back pain, with 3 herniated discs. She has been seen by her doctor and has been getting injections into her spine for pain management. Last injection was more than 6 months ago. Patient states she does money for the injections to that doctor and they will no longer see her. She reports that in the last several days her pain worsened. She states pain is in the left lower back, it does not radiate, it feels "burning." She states that the skin over that area feels numb. She denies any fever or chills. She denies any urinary symptoms. She denies abdominal pain. There is no numbness or weakness in her extremities. There is no loss of bowels or urinary incontinence or retention. There is no numbness sensation over her perineum. She states she currently does not have any pain medications to take at home. She is requesting a referral to a neurosurgeon.   Past Medical History  Diagnosis Date  . Hypertension   . Sleep apnea     CPAP  . Blood transfusion 1990    In [place], [place] with C/S Surgery - 5 units transfused  . Headache(784.0)     otc med prn  . Plantar fasciitis of left foot   . Arthritis     osteoarthritis - knees  . Chronic back pain    Past Surgical History  Procedure Laterality Date  . Cesarean section  1990    x 1  . Hernia repair    . Bilateral oophorectomy    . Knee surgery      right knee  . Svd      x 2   History reviewed. No pertinent family history. History  Substance Use Topics  . Smoking status: Never Smoker   . Smokeless tobacco: Never Used  .  Alcohol Use: No   OB History   Grav Para Term Preterm Abortions TAB SAB Ect Mult Living                 Review of Systems  Constitutional: Negative for fever and chills.  Respiratory: Negative for cough and chest tightness.   Gastrointestinal: Negative for nausea, vomiting, abdominal pain and diarrhea.  Genitourinary: Negative for dysuria, urgency, frequency, flank pain and pelvic pain.  Musculoskeletal: Positive for back pain. Negative for neck pain.  Skin: Negative for rash.  Neurological: Negative for dizziness, weakness and numbness.  All other systems reviewed and are negative.      Allergies  Penicillins  Home Medications   Current Outpatient Rx  Name  Route  Sig  Dispense  Refill  . aspirin-acetaminophen-caffeine (EXCEDRIN MIGRAINE) 250-250-65 MG per tablet   Oral   Take 2 tablets by mouth every 6 (six) hours as needed for headache or migraine.         . cyclobenzaprine (FLEXERIL) 10 MG tablet   Oral   Take 1 tablet (10 mg total) by mouth 2 (two) times daily as needed for muscle spasms.   20 tablet   0   . ibuprofen (ADVIL,MOTRIN) 200 MG tablet   Oral   Take 800-1,000 mg  by mouth every 8 (eight) hours as needed for moderate pain.         Marland Kitchen lisinopril-hydrochlorothiazide (PRINZIDE,ZESTORETIC) 20-25 MG per tablet   Oral   Take 1 tablet by mouth daily.   30 tablet   3   . meloxicam (MOBIC) 7.5 MG tablet   Oral   Take 7.5 mg by mouth daily.         . Vitamin D, Ergocalciferol, (DRISDOL) 50000 UNITS CAPS   Oral   Take 50,000 Units by mouth 2 (two) times a week. Sunday and Wednesday          BP 147/88  Pulse 66  Temp(Src) 98.1 F (36.7 C) (Oral)  Resp 18  SpO2 98% Physical Exam  Nursing note and vitals reviewed. Constitutional: She appears well-developed and well-nourished. No distress.  HENT:  Head: Normocephalic.  Eyes: Conjunctivae are normal.  Neck: Neck supple.  Cardiovascular: Normal rate, regular rhythm and normal heart sounds.    Dorsal pedal pulses intact bilaterally  Pulmonary/Chest: Effort normal and breath sounds normal. No respiratory distress. She has no wheezes. She has no rales.  Abdominal: Soft. Bowel sounds are normal. She exhibits no distension. There is no tenderness. There is no rebound.  Musculoskeletal: She exhibits no edema.  Tenderness over left lower paravertebral lumbar spine area. Tenderness in the left SI joint. Patient reports decreased sensation but soft touch over the left SI and buttock area. Pain with left straight leg raise. Nonpitting edema of bilateral lower extremities.  Neurological: She is alert.  5/5 and equal lower extremity strength. 2+ and equal patellar reflexes bilaterally. Pt able to dorsiflex bilateral toes and feet with good strength against resistance. Equal sensation bilaterally over thighs and lower legs.   Skin: Skin is warm and dry.  Psychiatric: She has a normal mood and affect. Her behavior is normal.    ED Course  Procedures (including critical care time) Labs Review Labs Reviewed  URINALYSIS, ROUTINE W REFLEX MICROSCOPIC   Imaging Review No results found.   EKG Interpretation None      MDM   Final diagnoses:  Lumbosacral radiculopathy    patient with nontraumatic increased pain in her chronic lower back pain. Her tenderness is mainly in the left SI joint, with some decreased sensation over the skin in that area. There is no red flags to suggest cauda equina at this time. There is no sound of seizure, no urinary retention or changes in bowels. UA obtained and is normal. There is no neuro deficits on her exam except for the decreased sensation. Dorsal pedal pulses intact bilaterally.at this time I believe this is exacerbation of chronic pain. Patient requesting a referral to neurosurgery, will refer. Patient given a Decadron injection in emergency department, will be discharged home with Flexeril, Naprosyn, and Percocet for pain. At this time I do not think any  further imaging is indicated an emergent basis. Followup instructed. Return precautions given.   Filed Vitals:   05/16/13 1833  BP: 147/88  Pulse: 66  Temp: 98.1 F (36.7 C)  TempSrc: Oral  Resp: 18  SpO2: 98%       Renold Genta, PA-C 05/16/13 2303

## 2013-05-16 NOTE — Discharge Instructions (Signed)
Take pain medications as prescribed. Please followup with a primary care doctor or neurosurgery for further treatment of your back pain. Try heating pads. Gentle stretches. Back exercises.  Lumbosacral Radiculopathy Lumbosacral radiculopathy is a pinched nerve or nerves in the low back (lumbosacral area). When this happens you may have weakness in your legs and may not be able to stand on your toes. You may have pain going down into your legs. There may be difficulties with walking normally. There are many causes of this problem. Sometimes this may happen from an injury, or simply from arthritis or boney problems. It may also be caused by other illnesses such as diabetes. If there is no improvement after treatment, further studies may be done to find the exact cause. DIAGNOSIS  X-rays may be needed if the problems become long standing. Electromyograms may be done. This study is one in which the working of nerves and muscles is studied. HOME CARE INSTRUCTIONS   Applications of ice packs may be helpful. Ice can be used in a plastic bag with a towel around it to prevent frostbite to skin. This may be used every 2 hours for 20 to 30 minutes, or as needed, while awake, or as directed by your caregiver.  Only take over-the-counter or prescription medicines for pain, discomfort, or fever as directed by your caregiver.  If physical therapy was prescribed, follow your caregiver's directions. SEEK IMMEDIATE MEDICAL CARE IF:   You have pain not controlled with medications.  You seem to be getting worse rather than better.  You develop increasing weakness in your legs.  You develop loss of bowel or bladder control.  You have difficulty with walking or balance, or develop clumsiness in the use of your legs.  You have a fever. MAKE SURE YOU:   Understand these instructions.  Will watch your condition.  Will get help right away if you are not doing well or get worse. Document Released: 02/17/2005  Document Revised: 05/12/2011 Document Reviewed: 10/08/2007 [facility] Patient Information 2014 [place].  Back Exercises Back exercises help treat and prevent back injuries. The goal of back exercises is to increase the strength of your abdominal and back muscles and the flexibility of your back. These exercises should be started when you no longer have back pain. Back exercises include:  Pelvic Tilt. Lie on your back with your knees bent. Tilt your pelvis until the lower part of your back is against the floor. Hold this position 5 to 10 sec and repeat 5 to 10 times.  Knee to Chest. Pull first 1 knee up against your chest and hold for 20 to 30 seconds, repeat this with the other knee, and then both knees. This may be done with the other leg straight or bent, whichever feels better.  Sit-Ups or Curl-Ups. Bend your knees 90 degrees. Start with tilting your pelvis, and do a partial, slow sit-up, lifting your trunk only 30 to 45 degrees off the floor. Take at least 2 to 3 seconds for each sit-up. Do not do sit-ups with your knees out straight. If partial sit-ups are difficult, simply do the above but with only tightening your abdominal muscles and holding it as directed.  Hip-Lift. Lie on your back with your knees flexed 90 degrees. Push down with your feet and shoulders as you raise your hips a couple inches off the floor; hold for 10 seconds, repeat 5 to 10 times.  Back arches. Lie on your stomach, propping yourself up on bent elbows. Slowly  press on your hands, causing an arch in your low back. Repeat 3 to 5 times. Any initial stiffness and discomfort should lessen with repetition over time.  Shoulder-Lifts. Lie face down with arms beside your body. Keep hips and torso pressed to floor as you slowly lift your head and shoulders off the floor. Do not overdo your exercises, especially in the beginning. Exercises may cause you some mild back discomfort which lasts for a few minutes; however, if the  pain is more severe, or lasts for more than 15 minutes, do not continue exercises until you see your caregiver. Improvement with exercise therapy for back problems is slow.  See your caregivers for assistance with developing a proper back exercise program. Document Released: 03/27/2004 Document Revised: 05/12/2011 Document Reviewed: 12/19/2010 [facility] Patient Information 2014 [place].

## 2013-06-17 ENCOUNTER — Other Ambulatory Visit: Payer: Self-pay | Admitting: Neurosurgery

## 2013-06-17 DIAGNOSIS — M545 Low back pain, unspecified: Secondary | ICD-10-CM

## 2013-06-29 ENCOUNTER — Ambulatory Visit
Admission: RE | Admit: 2013-06-29 | Discharge: 2013-06-29 | Disposition: A | Discharge status: Home / Self Care | Payer: BC Managed Care – PPO | Source: Ambulatory Visit | Attending: Neurosurgery | Admitting: Neurosurgery

## 2013-06-29 DIAGNOSIS — M545 Low back pain, unspecified: Secondary | ICD-10-CM

## 2013-07-13 ENCOUNTER — Other Ambulatory Visit: Payer: Self-pay | Admitting: Neurosurgery

## 2013-07-13 DIAGNOSIS — M545 Low back pain, unspecified: Secondary | ICD-10-CM

## 2013-07-18 ENCOUNTER — Ambulatory Visit
Admission: RE | Admit: 2013-07-18 | Discharge: 2013-07-18 | Disposition: A | Discharge status: Home / Self Care | Payer: BC Managed Care – PPO | Source: Ambulatory Visit | Attending: Neurosurgery | Admitting: Neurosurgery

## 2013-07-18 DIAGNOSIS — M545 Low back pain, unspecified: Secondary | ICD-10-CM

## 2013-09-30 ENCOUNTER — Encounter: Payer: Self-pay | Admitting: Obstetrics and Gynecology

## 2013-09-30 ENCOUNTER — Ambulatory Visit (INDEPENDENT_AMBULATORY_CARE_PROVIDER_SITE_OTHER): Payer: BC Managed Care – PPO | Admitting: Obstetrics and Gynecology

## 2013-09-30 VITALS — BP 122/82 | HR 64 | Resp 18 | Ht 65.0 in | Wt 314.8 lb

## 2013-09-30 DIAGNOSIS — N950 Postmenopausal bleeding: Secondary | ICD-10-CM

## 2013-09-30 MED ORDER — VENLAFAXINE HCL 37.5 MG PO TABS: 37.5000 mg | ORAL_TABLET | Freq: Two times a day (BID) | ORAL | Status: DC

## 2013-09-30 NOTE — Progress Notes (Signed)
Patient ID: Hailey Vance, female   DOB: 09-Jul-1965, 48 y.o.   MRN: [id number] GYNECOLOGY VISIT  PCP: Cooper Render, MD  Referring provider:   HPI: 48 y.o.   Divorced  [place] American  female   [phone] with Patient's last menstrual period was 10/02/2007.   here for evaluation of postmenopausal bleeding.  Began bleeding 08/08/13 got 2 weeks.  Then bled 08/29/13 off and on with occasional clots. Also had bleeding once 18 months ago and saw Dr. Garwin Brothers and was told she had fibroids.  Did an endometrial biopsy as well.  Was planning on a dilation and curettage and did not proceed with this.  ?intracavitary fibroid?  Having mood swings and anger episodes.  Crying episodes. Not sleeping due to hot flashes.  Up to bathroom and then cannot sleep again.  Some depression.  Not suicidal.  No anxiety or anxiety attacks.  Took Paxil in past.  Did not work so she stopped taking it.   Hgb:     Urine:    GYNECOLOGIC HISTORY: Patient's last menstrual period was 10/02/2007. Sexually active:  no Partner preference: female Contraception:  Tubal  Menopausal hormone therapy: no DES exposure:   no Blood transfusions:  1990 after a c-section.  Post partum hemorrhage due to uterine atony.  Sexually transmitted diseases:  no  GYN procedures and prior surgeries: C-section, Tubal ligation, Left oophorectomy and right partial oophorectom(per pt.) - 2004 - [place], [place].  Last mammogram:  06-01-12 wnl:[facility]               Last pap and high risk HPV testing:  06/2012 normal:?HPV testing  History of abnormal pap smear: no    OB History   Grav Para Term Preterm Abortions TAB SAB Ect Mult Living   3 3 3       3        LIFESTYLE: Exercise:    no           Tobacco:   no Alcohol:   no Drug use:  no  Patient Active Problem List   Diagnosis Date Noted  . Chronic back pain     Past Medical History  Diagnosis Date  . Hypertension   . Sleep apnea     CPAP  . Blood transfusion 1990    In [name],  [place] with C/S Surgery - 5 units transfused  . Headache(784.0)     otc med prn  . Plantar fasciitis of left foot   . Arthritis     osteoarthritis - knees  . Chronic back pain   . Blood transfusion without reported diagnosis 1990    after a c/section  . Anxiety   . Depression   . Endometriosis   . Fibroid     Past Surgical History  Procedure Laterality Date  . Cesarean section  1990    x 1  . Hernia repair    . Bilateral oophorectomy    . Knee surgery      right knee  . Svd      x 2  . Tubal ligation  1990    Current Outpatient Prescriptions  Medication Sig Dispense Refill  . aspirin-acetaminophen-caffeine (EXCEDRIN MIGRAINE) 250-250-65 MG per tablet Take 2 tablets by mouth every 6 (six) hours as needed for headache or migraine.      Marland Kitchen ibuprofen (ADVIL,MOTRIN) 200 MG tablet Take 800-1,000 mg by mouth every 8 (eight) hours as needed for moderate pain.      Marland Kitchen lisinopril-hydrochlorothiazide (PRINZIDE,ZESTORETIC) 20-25 MG per tablet  Take 1 tablet by mouth daily.  30 tablet  3  . meloxicam (MOBIC) 7.5 MG tablet Take 7.5 mg by mouth daily.      . Vitamin D, Ergocalciferol, (DRISDOL) 50000 UNITS CAPS Take 50,000 Units by mouth 2 (two) times a week. Sunday and Wednesday      . [DISCONTINUED] LISINOPRIL PO Take by mouth.       No current facility-administered medications for this visit.     ALLERGIES: Penicillins  Family History  Problem Relation Age of Onset  . Lung cancer Mother 62    dec  . Cancer Mother   . Hypertension Mother   . Cancer Father 53    prostate  . Hypertension Father   . Leukemia Sister   . Hypertension Sister   . Hypertension Brother   . Breast cancer Paternal Aunt   . Diabetes Maternal Grandmother   . Stroke Maternal Grandmother   . Stroke Paternal Grandmother   . Multiple sclerosis Sister   . Hypertension Sister   . Hypertension Sister   . Hypertension Brother   . Hypertension Brother     History   Social History  . Marital Status: Divorced     Spouse Name: N/A    Number of Children: N/A  . Years of Education: N/A   Occupational History  . Not on file.   Social History Main Topics  . Smoking status: Never Smoker   . Smokeless tobacco: Never Used  . Alcohol Use: No  . Drug Use: No  . Sexual Activity: Not Currently    Partners: Male    Birth Control/ Protection: Surgical     Comment: Tubal   Other Topics Concern  . Not on file   Social History Narrative  . No narrative on file    ROS:  Pertinent items are noted in HPI.  PHYSICAL EXAMINATION:    BP 122/82  Pulse 64  Resp 18  Ht 5\' 5"  (1.651 m)  Wt 314 lb 12.8 oz (142.792 kg)  BMI 52.39 kg/m2  LMP 10/02/2007   Wt Readings from Last 3 Encounters:  09/30/13 314 lb 12.8 oz (142.792 kg)  05/09/11 282 lb (127.914 kg)     Ht Readings from Last 3 Encounters:  09/30/13 5\' 5"  (1.651 m)  05/09/11 5' 3.75" (1.619 m)    General appearance: alert, cooperative and appears stated age Head: Normocephalic, without obvious abnormality, atraumatic Neck: no adenopathy, supple, symmetrical, trachea midline and thyroid not enlarged, symmetric, no tenderness/mass/nodules Lungs: clear to auscultation bilaterally Breasts: Inspection negative, No nipple retraction or dimpling, No nipple discharge or bleeding, No axillary or supraclavicular adenopathy, Normal to palpation without dominant masses Heart: regular rate and rhythm Abdomen: obese, Pfannenstiel incision, soft, non-tender; no masses,  no organomegaly Extremities: extremities normal, atraumatic, no cyanosis or edema Skin: Skin color, texture, turgor normal. No rashes or lesions Lymph nodes: Cervical, supraclavicular, and axillary nodes normal. No abnormal inguinal nodes palpated Neurologic: Grossly normal  Pelvic: External genitalia:  no lesions              Urethra:  normal appearing urethra with no masses, tenderness or lesions              Bartholins and Skenes: normal                 Vagina: normal appearing  vagina with normal color and discharge, no lesions.  Blood noted.               Cervix:  normal appearance                 Bimanual Exam:  Uterus:  uterus is normal size, shape, consistency and nontender.  Exam limited by body habitus.                                       Adnexa: normal adnexa in size, nontender and no masses                                      Rectovaginal: Confirms                                      Anus:  normal sphincter tone, no lesions  ASSESSMENT  Postmenopausal bleeding.  History of uterine fibroid.  Status post left oophorectomy and partial right oophorectomy.  History of Cesarean Section and postpartum hemorrhage.  Depression.  Anger.  PLAN  Check TSH, prolactin, FSH. LH, estradiol. Get records from Dr. Garwin Brothers and from prior oophorectomy.  Return for sonohysterogram and EMB. Start Effexor 37.5 mg po bid.  See Epic orders. Referral to Marya Amsler, counselor. Patient will call for appointment.   An After Visit Summary was printed and given to the patient.  45 minutes face to face time of which over 50% was spent in counseling.

## 2013-09-30 NOTE — Patient Instructions (Signed)
Venlafaxine tablets What is this medicine? VENLAFAXINE (VEN la fax een) is used to treat depression, anxiety and panic disorder. This medicine may be used for other purposes; ask your health care provider or pharmacist if you have questions. COMMON BRAND NAME(S): Effexor What should I tell my health care provider before I take this medicine? They need to know if you have any of these conditions: -bleeding disorders -glaucoma -heart disease -high blood pressure -high cholesterol -kidney disease -liver disease -low levels of sodium in the blood -mania or bipolar disorder -seizures -suicidal thoughts, plans, or attempt; a previous suicide attempt by you or a family -take medicines that treat or prevent blood clots -thyroid disease -an unusual or allergic reaction to venlafaxine, desvenlafaxine, other medicines, foods, dyes, or preservatives -pregnant or trying to get pregnant -breast-feeding How should I use this medicine? Take this medicine by mouth with a glass of water. Follow the directions on the prescription label. Take it with food. Take your medicine at regular intervals. Do not take your medicine more often than directed. Do not stop taking this medicine suddenly except upon the advice of your doctor. Stopping this medicine too quickly may cause serious side effects or your condition may worsen. A special MedGuide will be given to you by the pharmacist with each prescription and refill. Be sure to read this information carefully each time. Talk to your pediatrician regarding the use of this medicine in children. Special care may be needed. Overdosage: If you think you have taken too much of this medicine contact a poison control center or emergency room at once. NOTE: This medicine is only for you. Do not share this medicine with others. What if I miss a dose? If you miss a dose, take it as soon as you can. If it is almost time for your next dose, take only that dose. Do not take  double or extra doses. What may interact with this medicine? Do not take this medicine with any of the following medications: -certain medicines for fungal infections like fluconazole, itraconazole, ketoconazole, posaconazole, voriconazole -cisapride -desvenlafaxine -dofetilide -dronedarone -duloxetine -levomilnacipran -linezolid -MAOIs like Carbex, Eldepryl, Marplan, Nardil, and Parnate -methylene blue (injected into a vein) -milnacipran -pimozide -thioridazine -ziprasidone This medicine may also interact with the following medications: -aspirin and aspirin-like medicines -certain medicines for depression, anxiety, or psychotic disturbances -certain medicines for migraine headaches like almotriptan, eletriptan, frovatriptan, naratriptan, rizatriptan, sumatriptan, zolmitriptan -certain medicines for sleep -certain medicines that treat or prevent blood clots like dalteparin, enoxaparin, warfarin -cimetidine -clozapine -diuretics -fentanyl -furazolidone -indinavir -isoniazid -lithium -metoprolol -NSAIDS, medicines for pain and inflammation, like ibuprofen or naproxen -other medicines that prolong the QT interval (cause an abnormal heart rhythm) -procarbazine -rasagiline -supplements like St. John's wort, kava kava, valerian -tramadol -tryptophan This list may not describe all possible interactions. Give your health care provider a list of all the medicines, herbs, non-prescription drugs, or dietary supplements you use. Also tell them if you smoke, drink alcohol, or use illegal drugs. Some items may interact with your medicine. What should I watch for while using this medicine? Tell your doctor if your symptoms do not get better or if they get worse. Visit your doctor or health care professional for regular checks on your progress. Because it may take several weeks to see the full effects of this medicine, it is important to continue your treatment as prescribed by your  doctor. Patients and their families should watch out for new or worsening thoughts of suicide or depression. Also watch   out for sudden changes in feelings such as feeling anxious, agitated, panicky, irritable, hostile, aggressive, impulsive, severely restless, overly excited and hyperactive, or not being able to sleep. If this happens, especially at the beginning of treatment or after a change in dose, call your health care professional. This medicine can cause an increase in blood pressure. Check with your doctor for instructions on monitoring your blood pressure while taking this medicine. You may get drowsy or dizzy. Do not drive, use machinery, or do anything that needs mental alertness until you know how this medicine affects you. Do not stand or sit up quickly, especially if you are an older patient. This reduces the risk of dizzy or fainting spells. Alcohol may interfere with the effect of this medicine. Avoid alcoholic drinks. Your mouth may get dry. Chewing sugarless gum, sucking hard candy and drinking plenty of water will help. Contact your doctor if the problem does not go away or is severe. What side effects may I notice from receiving this medicine? Side effects that you should report to your doctor or health care professional as soon as possible: -allergic reactions like skin rash, itching or hives, swelling of the face, lips, or tongue -breathing problems -changes in vision -seizures -suicidal thoughts or other mood changes -trouble passing urine or change in the amount of urine -unusual bleeding or bruising Side effects that usually do not require medical attention (report to your doctor or health care professional if they continue or are bothersome): -change in sex drive or performance -constipation -increased sweating -loss of appetite -nausea -tremors -weight loss This list may not describe all possible side effects. Call your doctor for medical advice about side effects.  You may report side effects to FDA at 1-800-FDA-1088. Where should I keep my medicine? Keep out of the reach of children. Store at a controlled temperature between 20 and 25 degrees C (68 and 77 degrees F), in a dry place. Throw away any unused medicine after the expiration date. NOTE: This sheet is a summary. It may not cover all possible information. If you have questions about this medicine, talk to your doctor, pharmacist, or health care provider.  2015, [employer]vier/Gold Standard. (2012-09-14 12:43:55)  

## 2013-10-01 LAB — PROLACTIN: Prolactin: 3.8 ng/mL

## 2013-10-01 LAB — FSH/LH
FSH: 62.6 m[IU]/mL
LH: 30.2 m[IU]/mL

## 2013-10-01 LAB — TSH: TSH: 1.932 u[IU]/mL (ref 0.350–4.500)

## 2013-10-01 LAB — ESTRADIOL: Estradiol: 28.9 pg/mL

## 2013-10-06 ENCOUNTER — Telehealth: Payer: Self-pay

## 2013-10-06 NOTE — Telephone Encounter (Signed)
Message copied by Lowella Fairy on Thu Oct 06, 2013 10:27 AM ------      Message from: [place], [place]: Mon Oct 03, 2013  9:46 AM       Labs do look like menopause.       TSH is normal.             I recommend proceeding with the sonohysterogram and endometrial biopsy as planned. ------

## 2013-10-06 NOTE — Telephone Encounter (Signed)
LMOVM [phone] to call office (for lab results).

## 2013-10-10 ENCOUNTER — Telehealth: Payer: Self-pay | Admitting: Obstetrics and Gynecology

## 2013-10-10 DIAGNOSIS — N950 Postmenopausal bleeding: Secondary | ICD-10-CM

## 2013-10-10 NOTE — Telephone Encounter (Signed)
Left message for patient to call back. Need to go over benefits and schedule SHGM/EMB °

## 2013-10-11 NOTE — Telephone Encounter (Signed)
Left message for patient to call back  

## 2013-10-11 NOTE — Telephone Encounter (Signed)
Please contact patient regarding benefits for EMB and sonohysterogram.   Thanks.   Cc- Denyse Dago

## 2013-10-11 NOTE — Telephone Encounter (Signed)
Pt informed of labs results. Pt voiced understanding. She would like to proceed with the sonohysterogram and endometrial biopsy.

## 2013-10-12 NOTE — Telephone Encounter (Signed)
Patient notified of lab results--see phone note of 10-11-13.

## 2013-10-13 NOTE — Telephone Encounter (Addendum)
Pt is returning a call to [place] pt is not sure.

## 2013-10-13 NOTE — Telephone Encounter (Signed)
Spoke with patient. Advised that per benefit quote received, she will be responsible for $406.31 when she comes in for SHGM/EMB.  I advised patient that payment is due at the time of service. Patient states that she cannot afford to have this procedure performed.  Is there an alternative procedure?

## 2013-10-18 NOTE — Telephone Encounter (Signed)
Return call to patietn, LMTCB.

## 2013-10-19 NOTE — Telephone Encounter (Signed)
Returned patient's call on [phone] and LMOVM to call me back.  (we did not do pap this year--pt had pap last year which was normal.)

## 2013-10-19 NOTE — Telephone Encounter (Signed)
Via MyChart, patient calls, :Patient thought she had a pap done at her last office visit? Patient is asking if it was done?

## 2013-10-20 NOTE — Telephone Encounter (Signed)
Pelvic ultrasound and endometrial biopsy could be a good option as a starting point.  I agree with your recommendations.

## 2013-10-20 NOTE — Telephone Encounter (Signed)
Call to patient regarding scheduling of recommended PUS/SHGM/Endo BX. Patient states she works part time and this amount of $406 would be her whole check. Advised that estimate is not required to schedule/ just due at time of service. Can refer to [facility] however may not be able to provide much assistance since she does have private insurance. Offered to refer to practice of choice that has payment options. Stressed important that she be evaluated and we are happy to facilitate that if she cant not come here. Advised these tests are essential to rule out precancerous and or cancerous cells as cause for PMB. Stressed that this should be done as soon possible. Discussed starting with PUS and Endo BX. Can discuss with Dr Quincy Simmonds delay of [facility] pending results of PUS and endo BX. Patient would like to know cost difference. Advised will have Sabrina call her with payment. Patient states can leave message.   Dr Quincy Simmonds, is this agreeable? Any other suggestions?

## 2013-10-27 NOTE — Telephone Encounter (Signed)
Spoke with patient. Advised that per benefit quote received, she will be responsible for $258.97 for PUS/EMB. Patient agrees that this is an amount that she should be able to afford. States that she will not be able to do it for the next couple of weeks because something has come up. Scheduled PUS/EMB for 09.17.2015. Advised patient of 72 hour cancellation policy and $553 cancellation fee. Patient agreeable.

## 2013-10-27 NOTE — Telephone Encounter (Signed)
Order entered for PUS/EMB.  Routing to provider for final review. Patient agreeable to disposition. Will close encounter

## 2013-11-17 ENCOUNTER — Other Ambulatory Visit: Payer: BC Managed Care – PPO | Admitting: Obstetrics and Gynecology

## 2013-11-17 ENCOUNTER — Telehealth: Payer: Self-pay | Admitting: Obstetrics and Gynecology

## 2013-11-17 ENCOUNTER — Other Ambulatory Visit: Payer: BC Managed Care – PPO

## 2013-11-17 ENCOUNTER — Encounter: Payer: Self-pay | Admitting: Obstetrics and Gynecology

## 2013-11-17 NOTE — Telephone Encounter (Signed)
I do recommend rescheduling the appointment.  Patient has postmenopausal bleeding.

## 2013-11-17 NOTE — Telephone Encounter (Signed)
Patient dnka her PUS/ENDO BX appointment today, letter mailed, fee applied. Please call patient to reschedule.  To triage to reschedule.

## 2013-11-18 NOTE — Telephone Encounter (Signed)
Left message to call Chanc Kervin at [phone]. 

## 2013-11-28 NOTE — Telephone Encounter (Signed)
Left message to call Hailey Vance at [phone]. 

## 2013-12-06 NOTE — Telephone Encounter (Signed)
Tried to reach patient at number provided [phone]. There was no answer and the voicemail box is currently full.

## 2013-12-09 NOTE — Telephone Encounter (Signed)
Dr. Quincy Simmonds,  Triage has attempted to reach patient x 3 without success.   How to proceed? Letter via [place] mail or certified? Recall?

## 2013-12-11 NOTE — Telephone Encounter (Signed)
I recommend a letter through regular mail and certified letter as well.  Needs follow up for bleeding and starting Effexor. Thank you!

## 2013-12-21 ENCOUNTER — Encounter: Payer: Self-pay | Admitting: *Deleted

## 2013-12-22 NOTE — Telephone Encounter (Signed)
Letter to your desk for review. Please advise.

## 2013-12-23 NOTE — Telephone Encounter (Signed)
Thank you.  I have signed the letter.

## 2013-12-26 NOTE — Telephone Encounter (Signed)
Letter mailed today, certified and regular mail. Will hold for chart for 30 days to see if patient responds. Current encounter closed.

## 2014-01-02 ENCOUNTER — Encounter: Payer: Self-pay | Admitting: Obstetrics and Gynecology

## 2014-05-05 ENCOUNTER — Other Ambulatory Visit: Payer: Self-pay | Admitting: Obstetrics and Gynecology

## 2014-05-15 ENCOUNTER — Emergency Department (HOSPITAL_COMMUNITY)
Admission: EM | Admit: 2014-05-15 | Discharge: 2014-05-15 | Disposition: A | Discharge status: Home / Self Care | Payer: BLUE CROSS/BLUE SHIELD | Attending: Emergency Medicine | Admitting: Emergency Medicine

## 2014-05-15 ENCOUNTER — Encounter (HOSPITAL_COMMUNITY): Payer: Self-pay | Admitting: *Deleted

## 2014-05-15 DIAGNOSIS — I10 Essential (primary) hypertension: Secondary | ICD-10-CM | POA: Insufficient documentation

## 2014-05-15 DIAGNOSIS — R05 Cough: Secondary | ICD-10-CM | POA: Diagnosis present

## 2014-05-15 DIAGNOSIS — Z79899 Other long term (current) drug therapy: Secondary | ICD-10-CM | POA: Diagnosis not present

## 2014-05-15 DIAGNOSIS — Z9981 Dependence on supplemental oxygen: Secondary | ICD-10-CM | POA: Insufficient documentation

## 2014-05-15 DIAGNOSIS — F419 Anxiety disorder, unspecified: Secondary | ICD-10-CM | POA: Insufficient documentation

## 2014-05-15 DIAGNOSIS — G4730 Sleep apnea, unspecified: Secondary | ICD-10-CM | POA: Insufficient documentation

## 2014-05-15 DIAGNOSIS — M170 Bilateral primary osteoarthritis of knee: Secondary | ICD-10-CM | POA: Diagnosis not present

## 2014-05-15 DIAGNOSIS — Z791 Long term (current) use of non-steroidal anti-inflammatories (NSAID): Secondary | ICD-10-CM | POA: Diagnosis not present

## 2014-05-15 DIAGNOSIS — Z8742 Personal history of other diseases of the female genital tract: Secondary | ICD-10-CM | POA: Diagnosis not present

## 2014-05-15 DIAGNOSIS — M542 Cervicalgia: Secondary | ICD-10-CM | POA: Insufficient documentation

## 2014-05-15 DIAGNOSIS — J069 Acute upper respiratory infection, unspecified: Secondary | ICD-10-CM

## 2014-05-15 DIAGNOSIS — G8929 Other chronic pain: Secondary | ICD-10-CM | POA: Diagnosis not present

## 2014-05-15 DIAGNOSIS — Z880 Allergy status to penicillin: Secondary | ICD-10-CM | POA: Insufficient documentation

## 2014-05-15 DIAGNOSIS — F329 Major depressive disorder, single episode, unspecified: Secondary | ICD-10-CM | POA: Diagnosis not present

## 2014-05-15 MED ORDER — ALBUTEROL SULFATE HFA 108 (90 BASE) MCG/ACT IN AERS: 1.0000 | INHALATION_SPRAY | Freq: Four times a day (QID) | RESPIRATORY_TRACT | Status: DC | PRN

## 2014-05-15 MED ORDER — HYDROCOD POLST-CHLORPHEN POLST 10-8 MG/5ML PO LQCR: 5.0000 mL | Freq: Two times a day (BID) | ORAL | Status: DC | PRN

## 2014-05-15 NOTE — ED Provider Notes (Signed)
CSN: [id number]     Arrival date & time 05/15/14  1205 History  This chart was scribed for non-physician practitioner, Comer Locket, PA-C working with Orpah Greek, MD by Einar Pheasant, Medical Scribe. This patient was seen in room WTR7/WTR7 and the patient's care was started at 3:10 PM.    Chief Complaint  Patient presents with  . Cough  . Generalized Body Aches  . Neck Pain   The history is provided by the patient and medical records. No language interpreter was used.   HPI Comments: Hailey Vance is a 49 y.o. female with PMhx of HTN presents to the Emergency Department complaining of gradual onset persistent generalized myalgias that started 1 week ago. Pt is also complaining of associated cough which is worse at night, sinus pressure, decreased appetite, and neck pain that started after stretching to get something. Neck pain is only exacerbated by leaning her head to the right side. She endorses a subjective fever 1 week ago but it resolved on its own after 2 days. Current ED temperature is 98.5. She reports taking theraflu and sudafed with some moderate relief. Pt denies sore throat, visual disturbance, CP, SOB, abdominal pain, nausea, emesis, diarrhea, urinary symptoms, back pain, HA, weakness, numbness and rash as associated symptoms.    Past Medical History  Diagnosis Date  . Hypertension   . Sleep apnea     CPAP  . Blood transfusion 1990    In [place], [place] with C/S Surgery - 5 units transfused  . Headache(784.0)     otc med prn  . Plantar fasciitis of left foot   . Arthritis     osteoarthritis - knees  . Chronic back pain   . Blood transfusion without reported diagnosis 1990    after a c/section  . Anxiety   . Depression   . Endometriosis   . Fibroid    Past Surgical History  Procedure Laterality Date  . Cesarean section  1990    x 1  . Hernia repair    . Bilateral oophorectomy    . Knee surgery      right knee  . Svd      x 2  . Tubal ligation  1990    Family History  Problem Relation Age of Onset  . Lung cancer Mother 12    dec  . Cancer Mother   . Hypertension Mother   . Cancer Father 20    prostate  . Hypertension Father   . Leukemia Sister   . Hypertension Sister   . Hypertension Brother   . Breast cancer Paternal Aunt   . Diabetes Maternal Grandmother   . Stroke Maternal Grandmother   . Stroke Paternal Grandmother   . Multiple sclerosis Sister   . Hypertension Sister   . Hypertension Sister   . Hypertension Brother   . Hypertension Brother    History  Substance Use Topics  . Smoking status: Never Smoker   . Smokeless tobacco: Never Used  . Alcohol Use: No   OB History    Gravida Para Term Preterm AB TAB SAB Ectopic Multiple Living   3 3 3       3      Review of Systems  Constitutional: Positive for fever, chills and appetite change.  HENT: Positive for sinus pressure. Negative for congestion and sore throat.   Eyes: Negative for visual disturbance.  Respiratory: Positive for cough. Negative for shortness of breath.   Cardiovascular: Negative for chest pain and leg  swelling.  Gastrointestinal: Negative for nausea, vomiting, abdominal pain and diarrhea.  Genitourinary: Negative for dysuria.  Musculoskeletal: Positive for myalgias and neck pain. Negative for back pain.  Skin: Negative for rash.  Neurological: Negative for headaches.  All other systems reviewed and are negative.  Allergies  Penicillins  Home Medications   Prior to Admission medications   Medication Sig Start Date End Date Taking? Authorizing Provider  albuterol (PROVENTIL HFA;VENTOLIN HFA) 108 (90 BASE) MCG/ACT inhaler Inhale 1-2 puffs into the lungs every 6 (six) hours as needed for wheezing or shortness of breath. 05/15/14   Comer Locket, PA-C  aspirin-acetaminophen-caffeine (EXCEDRIN MIGRAINE) [phone] MG per tablet Take 2 tablets by mouth every 6 (six) hours as needed for headache or migraine.    Historical Provider, MD   chlorpheniramine-HYDROcodone (TUSSIONEX PENNKINETIC ER) 10-8 MG/5ML LQCR Take 5 mLs by mouth every 12 (twelve) hours as needed for cough (Cough). 05/15/14   Comer Locket, PA-C  ibuprofen (ADVIL,MOTRIN) 200 MG tablet Take 800-1,000 mg by mouth every 8 (eight) hours as needed for moderate pain.    Historical Provider, MD  lisinopril-hydrochlorothiazide (PRINZIDE,ZESTORETIC) 20-25 MG per tablet Take 1 tablet by mouth daily. 09/09/11   Dorie Rank, MD  meloxicam (MOBIC) 7.5 MG tablet Take 7.5 mg by mouth daily.    Historical Provider, MD  venlafaxine (EFFEXOR) 37.5 MG tablet Take 1 tablet (37.5 mg total) by mouth 2 (two) times daily with a meal. 09/30/13   Brook Oletta Lamas, MD  Vitamin D, Ergocalciferol, (DRISDOL) 50000 UNITS CAPS Take 50,000 Units by mouth 2 (two) times a week. Sunday and Wednesday    Historical Provider, MD   BP 142/93 mmHg  Pulse 74  Temp(Src) 98.5 F (36.9 C) (Oral)  SpO2 98%  LMP 10/02/2007  Physical Exam  Constitutional: She is oriented to person, place, and time. She appears well-developed and well-nourished. No distress.  HENT:  Head: Normocephalic and atraumatic.  Mouth/Throat: Oropharynx is clear and moist. No oropharyngeal exudate.  Eyes: Conjunctivae and EOM are normal. Right eye exhibits no discharge. Left eye exhibits no discharge.  Neck: Normal range of motion. Neck supple. No thyromegaly present.  No meningeal signs, no nuchal rigidity  Cardiovascular: Normal rate, regular rhythm, normal heart sounds and intact distal pulses.  Exam reveals no gallop and no friction rub.   No murmur heard. Pulmonary/Chest: Effort normal and breath sounds normal. No respiratory distress. She has no wheezes. She has no rales.  Abdominal: Soft. She exhibits no distension. There is no tenderness.  Musculoskeletal: Normal range of motion. She exhibits no edema or tenderness.  Lymphadenopathy:    She has no cervical adenopathy.  Neurological: She is alert and oriented to  person, place, and time.  Skin: Skin is warm and dry. She is not diaphoretic.  Psychiatric: She has a normal mood and affect. Her behavior is normal. Thought content normal.  Nursing note and vitals reviewed.   ED Course  Procedures (including critical care time)  DIAGNOSTIC STUDIES: Oxygen Saturation is 98% on RA, normal by my interpretation.    COORDINATION OF CARE: 3:19 PM- return precautions discussed with the pt. Pt advised of plan for treatment and pt agrees.  Labs Review Labs Reviewed - No data to display  Imaging Review No results found.   EKG Interpretation None     Meds given in ED:  Medications - No data to display  Discharge Medication List as of 05/15/2014  3:28 PM    START taking these medications  Details  albuterol (PROVENTIL HFA;VENTOLIN HFA) 108 (90 BASE) MCG/ACT inhaler Inhale 1-2 puffs into the lungs every 6 (six) hours as needed for wheezing or shortness of breath., Starting 05/15/2014, Until Discontinued, Print    chlorpheniramine-HYDROcodone (TUSSIONEX PENNKINETIC ER) 10-8 MG/5ML LQCR Take 5 mLs by mouth every 12 (twelve) hours as needed for cough (Cough)., Starting 05/15/2014, Until Discontinued, Print       Filed Vitals:   05/15/14 1233  BP: 142/93  Pulse: 74  Temp: 98.5 F (36.9 C)  TempSrc: Oral  SpO2: 98%    MDM  Vitals stable - WNL -afebrile Pt resting comfortably in ED. PE--benign lung exam, no meningeal signs, no nuchal rigidity.  DDX--likely viral URI. Encouraged continued use of Sudafed. Will give cough suppressants with pain medicines as well as bronchodilators. Instructions to follow-up with PCP for worsening symptoms.  I discussed all relevant lab findings and imaging results with pt and they verbalized understanding. Discussed f/u with PCP within 48 hrs and return precautions, pt very amenable to plan.  Final diagnoses:  URI (upper respiratory infection)    I personally performed the services described in this  documentation, which was scribed in my presence. The recorded information has been reviewed and is accurate.    Comer Locket, PA-C 05/15/14 [place], MD 05/16/14 [phone]

## 2014-05-15 NOTE — ED Notes (Signed)
Pt complains of cough, neck pain pain, body aches for 1 week. Pt states she initially had a fever. Pt states she has tried theraflu, sudafed, which she states has provided moderate relief.

## 2014-05-15 NOTE — Discharge Instructions (Signed)
Cough, Adult ° A cough is a reflex that helps clear your throat and airways. It can help heal the body or may be a reaction to an irritated airway. A cough may only last 2 or 3 weeks (acute) or may last more than 8 weeks (chronic).  °CAUSES °Acute cough: °· Viral or bacterial infections. °Chronic cough: °· Infections. °· Allergies. °· Asthma. °· Post-nasal drip. °· Smoking. °· Heartburn or acid reflux. °· Some medicines. °· Chronic lung problems (COPD). °· Cancer. °SYMPTOMS  °· Cough. °· Fever. °· Chest pain. °· Increased breathing rate. °· High-pitched whistling sound when breathing (wheezing). °· Colored mucus that you cough up (sputum). °TREATMENT  °· A bacterial cough may be treated with antibiotic medicine. °· A viral cough must run its course and will not respond to antibiotics. °· Your caregiver may recommend other treatments if you have a chronic cough. °HOME CARE INSTRUCTIONS  °· Only take over-the-counter or prescription medicines for pain, discomfort, or fever as directed by your caregiver. Use cough suppressants only as directed by your caregiver. °· Use a cold steam vaporizer or humidifier in your bedroom or home to help loosen secretions. °· Sleep in a semi-upright position if your cough is worse at night. °· Rest as needed. °· Stop smoking if you smoke. °SEEK IMMEDIATE MEDICAL CARE IF:  °· You have pus in your sputum. °· Your cough starts to worsen. °· You cannot control your cough with suppressants and are losing sleep. °· You begin coughing up blood. °· You have difficulty breathing. °· You develop pain which is getting worse or is uncontrolled with medicine. °· You have a fever. °MAKE SURE YOU:  °· Understand these instructions. °· Will watch your condition. °· Will get help right away if you are not doing well or get worse. °Document Released: 08/16/2010 Document Revised: 05/12/2011 Document Reviewed: 08/16/2010 °ExitCare® Patient Information ©2015 [employer]. This information is not intended  to replace advice given to you by your health care provider. Make sure you discuss any questions you have with your health care provider. °Upper Respiratory Infection, Adult °An upper respiratory infection (URI) is also sometimes known as the common cold. The upper respiratory tract includes the nose, sinuses, throat, trachea, and bronchi. Bronchi are the airways leading to the lungs. Most people improve within 1 week, but symptoms can last up to 2 weeks. A residual cough may last even longer.  °CAUSES °Many different viruses can infect the tissues lining the upper respiratory tract. The tissues become irritated and inflamed and often become very moist. Mucus production is also common. A cold is contagious. You can easily spread the virus to others by oral contact. This includes kissing, sharing a glass, coughing, or sneezing. Touching your mouth or nose and then touching a surface, which is then touched by another person, can also spread the virus. °SYMPTOMS  °Symptoms typically develop 1 to 3 days after you come in contact with a cold virus. Symptoms vary from person to person. They may include: °· Runny nose. °· Sneezing. °· Nasal congestion. °· Sinus irritation. °· Sore throat. °· Loss of voice (laryngitis). °· Cough. °· Fatigue. °· Muscle aches. °· Loss of appetite. °· Headache. °· Low-grade fever. °DIAGNOSIS  °You might diagnose your own cold based on familiar symptoms, since most people get a cold 2 to 3 times a year. Your caregiver can confirm this based on your exam. Most importantly, your caregiver can check that your symptoms are not due to another disease such   as strep throat, sinusitis, pneumonia, asthma, or epiglottitis. Blood tests, throat tests, and X-rays are not necessary to diagnose a common cold, but they may sometimes be helpful in excluding other more serious diseases. Your caregiver will decide if any further tests are required. °RISKS AND COMPLICATIONS  °You may be at risk for a more severe  case of the common cold if you smoke cigarettes, have chronic heart disease (such as heart failure) or lung disease (such as asthma), or if you have a weakened immune system. The very young and very old are also at risk for more serious infections. Bacterial sinusitis, middle ear infections, and bacterial pneumonia can complicate the common cold. The common cold can worsen asthma and chronic obstructive pulmonary disease (COPD). Sometimes, these complications can require emergency medical care and may be life-threatening. °PREVENTION  °The best way to protect against getting a cold is to practice good hygiene. Avoid oral or hand contact with people with cold symptoms. Wash your hands often if contact occurs. There is no clear evidence that vitamin C, vitamin E, echinacea, or exercise reduces the chance of developing a cold. However, it is always recommended to get plenty of rest and practice good nutrition. °TREATMENT  °Treatment is directed at relieving symptoms. There is no cure. Antibiotics are not effective, because the infection is caused by a virus, not by bacteria. Treatment may include: °· Increased fluid intake. Sports drinks offer valuable electrolytes, sugars, and fluids. °· Breathing heated mist or steam (vaporizer or shower). °· Eating chicken soup or other clear broths, and maintaining good nutrition. °· Getting plenty of rest. °· Using gargles or lozenges for comfort. °· Controlling fevers with ibuprofen or acetaminophen as directed by your caregiver. °· Increasing usage of your inhaler if you have asthma. °Zinc gel and zinc lozenges, taken in the first 24 hours of the common cold, can shorten the duration and lessen the severity of symptoms. Pain medicines may help with fever, muscle aches, and throat pain. A variety of non-prescription medicines are available to treat congestion and runny nose. Your caregiver can make recommendations and may suggest nasal or lung inhalers for other symptoms.  °HOME  CARE INSTRUCTIONS  °· Only take over-the-counter or prescription medicines for pain, discomfort, or fever as directed by your caregiver. °· Use a warm mist humidifier or inhale steam from a shower to increase air moisture. This may keep secretions moist and make it easier to breathe. °· Drink enough water and fluids to keep your urine clear or pale yellow. °· Rest as needed. °· Return to work when your temperature has returned to normal or as your caregiver advises. You may need to stay home longer to avoid infecting others. You can also use a face mask and careful hand washing to prevent spread of the virus. °SEEK MEDICAL CARE IF:  °· After the first few days, you feel you are getting worse rather than better. °· You need your caregiver's advice about medicines to control symptoms. °· You develop chills, worsening shortness of breath, or brown or red sputum. These may be signs of pneumonia. °· You develop yellow or brown nasal discharge or pain in the face, especially when you bend forward. These may be signs of sinusitis. °· You develop a fever, swollen neck glands, pain with swallowing, or white areas in the back of your throat. These may be signs of strep throat. °SEEK IMMEDIATE MEDICAL CARE IF:  °· You have a fever. °· You develop severe or persistent headache, ear   pain, sinus pain, or chest pain. °· You develop wheezing, a prolonged cough, cough up blood, or have a change in your usual mucus (if you have chronic lung disease). °· You develop sore muscles or a stiff neck. °Document Released: 08/13/2000 Document Revised: 05/12/2011 Document Reviewed: 05/25/2013 °ExitCare® Patient Information ©2015 [employer]. This information is not intended to replace advice given to you by your health care provider. Make sure you discuss any questions you have with your health care provider. ° °

## 2014-05-19 ENCOUNTER — Encounter: Payer: Self-pay | Admitting: Obstetrics and Gynecology

## 2014-08-01 ENCOUNTER — Telehealth: Payer: Self-pay | Admitting: Nurse Practitioner

## 2014-08-01 NOTE — Telephone Encounter (Signed)
Patient sent email requesting a new patient appointment. Called patient. Mailbox full, could not leave message.

## 2014-08-13 ENCOUNTER — Emergency Department (HOSPITAL_COMMUNITY)
Admission: EM | Admit: 2014-08-13 | Discharge: 2014-08-13 | Disposition: A | Discharge status: Home / Self Care | Payer: BLUE CROSS/BLUE SHIELD | Attending: Emergency Medicine | Admitting: Emergency Medicine

## 2014-08-13 ENCOUNTER — Encounter (HOSPITAL_COMMUNITY): Payer: Self-pay

## 2014-08-13 DIAGNOSIS — Z79899 Other long term (current) drug therapy: Secondary | ICD-10-CM | POA: Diagnosis not present

## 2014-08-13 DIAGNOSIS — Z9119 Patient's noncompliance with other medical treatment and regimen: Secondary | ICD-10-CM | POA: Insufficient documentation

## 2014-08-13 DIAGNOSIS — Z8742 Personal history of other diseases of the female genital tract: Secondary | ICD-10-CM | POA: Insufficient documentation

## 2014-08-13 DIAGNOSIS — G8929 Other chronic pain: Secondary | ICD-10-CM | POA: Diagnosis not present

## 2014-08-13 DIAGNOSIS — M170 Bilateral primary osteoarthritis of knee: Secondary | ICD-10-CM | POA: Insufficient documentation

## 2014-08-13 DIAGNOSIS — Z9981 Dependence on supplemental oxygen: Secondary | ICD-10-CM | POA: Insufficient documentation

## 2014-08-13 DIAGNOSIS — R05 Cough: Secondary | ICD-10-CM | POA: Insufficient documentation

## 2014-08-13 DIAGNOSIS — I10 Essential (primary) hypertension: Secondary | ICD-10-CM

## 2014-08-13 DIAGNOSIS — Z8679 Personal history of other diseases of the circulatory system: Secondary | ICD-10-CM | POA: Diagnosis not present

## 2014-08-13 DIAGNOSIS — R0602 Shortness of breath: Secondary | ICD-10-CM | POA: Diagnosis not present

## 2014-08-13 DIAGNOSIS — R059 Cough, unspecified: Secondary | ICD-10-CM

## 2014-08-13 DIAGNOSIS — Z86018 Personal history of other benign neoplasm: Secondary | ICD-10-CM | POA: Diagnosis not present

## 2014-08-13 DIAGNOSIS — F419 Anxiety disorder, unspecified: Secondary | ICD-10-CM | POA: Diagnosis not present

## 2014-08-13 DIAGNOSIS — G4730 Sleep apnea, unspecified: Secondary | ICD-10-CM | POA: Diagnosis not present

## 2014-08-13 DIAGNOSIS — R600 Localized edema: Secondary | ICD-10-CM | POA: Diagnosis not present

## 2014-08-13 DIAGNOSIS — F329 Major depressive disorder, single episode, unspecified: Secondary | ICD-10-CM | POA: Diagnosis not present

## 2014-08-13 DIAGNOSIS — M7989 Other specified soft tissue disorders: Secondary | ICD-10-CM | POA: Diagnosis present

## 2014-08-13 DIAGNOSIS — Z880 Allergy status to penicillin: Secondary | ICD-10-CM | POA: Insufficient documentation

## 2014-08-13 LAB — CBC
HEMATOCRIT: 36.8 % (ref 36.0–46.0)
HEMOGLOBIN: 11.7 g/dL — AB (ref 12.0–15.0)
MCH: 31.3 pg (ref 26.0–34.0)
MCHC: 31.8 g/dL (ref 30.0–36.0)
MCV: 98.4 fL (ref 78.0–100.0)
Platelets: 240 10*3/uL (ref 150–400)
RBC: 3.74 MIL/uL — ABNORMAL LOW (ref 3.87–5.11)
RDW: 13.5 % (ref 11.5–15.5)
WBC: 5.6 10*3/uL (ref 4.0–10.5)

## 2014-08-13 LAB — I-STAT TROPONIN, ED: Troponin i, poc: 0 ng/mL (ref 0.00–0.08)

## 2014-08-13 LAB — URINALYSIS, ROUTINE W REFLEX MICROSCOPIC
BILIRUBIN URINE: NEGATIVE
Glucose, UA: NEGATIVE mg/dL
Hgb urine dipstick: NEGATIVE
Ketones, ur: NEGATIVE mg/dL
Leukocytes, UA: NEGATIVE
Nitrite: NEGATIVE
PH: 6.5 (ref 5.0–8.0)
PROTEIN: NEGATIVE mg/dL
Specific Gravity, Urine: 1.024 (ref 1.005–1.030)
UROBILINOGEN UA: 1 mg/dL (ref 0.0–1.0)

## 2014-08-13 LAB — COMPREHENSIVE METABOLIC PANEL
ALBUMIN: 3.7 g/dL (ref 3.5–5.0)
ALT: 21 U/L (ref 14–54)
ANION GAP: 8 (ref 5–15)
AST: 28 U/L (ref 15–41)
Alkaline Phosphatase: 87 U/L (ref 38–126)
BILIRUBIN TOTAL: 0.4 mg/dL (ref 0.3–1.2)
BUN: 17 mg/dL (ref 6–20)
CALCIUM: 9.1 mg/dL (ref 8.9–10.3)
CHLORIDE: 105 mmol/L (ref 101–111)
CO2: 28 mmol/L (ref 22–32)
CREATININE: 1.26 mg/dL — AB (ref 0.44–1.00)
GFR calc Af Amer: 57 mL/min — ABNORMAL LOW (ref >60)
GFR, EST NON AFRICAN AMERICAN: 49 mL/min — AB (ref >60)
GLUCOSE: 96 mg/dL (ref 65–99)
Potassium: 3.9 mmol/L (ref 3.5–5.1)
Sodium: 141 mmol/L (ref 135–145)
Total Protein: 7 g/dL (ref 6.5–8.1)

## 2014-08-13 MED ORDER — HYDROCHLOROTHIAZIDE 12.5 MG PO CAPS
25.0000 mg | ORAL_CAPSULE | Freq: Once | ORAL | Status: AC
  Administered 2014-08-13: 25 mg via ORAL
  Filled 2014-08-13: qty 2

## 2014-08-13 MED ORDER — LISINOPRIL 20 MG PO TABS
20.0000 mg | ORAL_TABLET | Freq: Once | ORAL | Status: AC
  Administered 2014-08-13: 20 mg via ORAL
  Filled 2014-08-13: qty 1

## 2014-08-13 MED ORDER — VENLAFAXINE HCL 37.5 MG PO TABS: 37.5000 mg | ORAL_TABLET | Freq: Two times a day (BID) | ORAL | Status: DC

## 2014-08-13 MED ORDER — LISINOPRIL-HYDROCHLOROTHIAZIDE 20-25 MG PO TABS: 1.0000 | ORAL_TABLET | Freq: Every day | ORAL | Status: DC

## 2014-08-13 NOTE — Discharge Instructions (Signed)
If you were given medicines take as directed.  If you are on coumadin or contraceptives realize their levels and effectiveness is altered by many different medicines.  If you have any reaction (rash, tongues swelling, other) to the medicines stop taking and see a physician.    If your blood pressure was elevated in the ER make sure you follow up for management with a primary doctor or return for chest pain, shortness of breath or stroke symptoms.  Please follow up as directed and return to the ER or see a physician for new or worsening symptoms.  Thank you. Filed Vitals:   08/13/14 1754  BP: 174/92  Pulse: 85  Temp: 98.4 F (36.9 C)  Resp: 16  Height: 5\' 4"  (1.626 m)  Weight: 290 lb (131.543 kg)  SpO2: 99%

## 2014-08-13 NOTE — ED Notes (Signed)
Patient states she has had bilateral leg swelling x 1 1/2 weeks. Patient states that her legs were swelling and the swelling would go down every morning, but now the swelling is all the time for the past 3 days. Patient states she has had increased SOB. Patient also has had vaginal bleeding with clots. Patient states her last period was in 2009.

## 2014-08-13 NOTE — ED Provider Notes (Signed)
CSN: [id number]     Arrival date & time 08/13/14  1734 History   First MD Initiated Contact with Patient 08/13/14 1924     Chief Complaint  Patient presents with  . Leg Swelling  . Hypertension     (Consider location/radiation/quality/duration/timing/severity/associated sxs/prior Treatment) HPI Comments: 49 year old female presents with bilateral leg swelling gradual worsening from [place] [redacted] weeks along with mild shortness of breath. Patient has been out of her blood pressure meds for this time, patient has follow-up appointment scheduled however needs refill of her medications. Currently no shortness of breath. No congestive heart failure history. Mild unknown amount of weight gain. No chest pain no cardiac history. No fevers or chills. No blood clot history, no recent surgery, no active cancer.  The history is provided by the patient.    Past Medical History  Diagnosis Date  . Hypertension   . Sleep apnea     CPAP  . Blood transfusion 1990    In [place], [place] with C/S Surgery - 5 units transfused  . Headache(784.0)     otc med prn  . Plantar fasciitis of left foot   . Arthritis     osteoarthritis - knees  . Chronic back pain   . Blood transfusion without reported diagnosis 1990    after a c/section  . Anxiety   . Depression   . Endometriosis   . Fibroid    Past Surgical History  Procedure Laterality Date  . Cesarean section  1990    x 1  . Hernia repair    . Bilateral oophorectomy    . Knee surgery      right knee  . Svd      x 2  . Tubal ligation  1990   Family History  Problem Relation Age of Onset  . Lung cancer Mother 65    dec  . Cancer Mother   . Hypertension Mother   . Cancer Father 38    prostate  . Hypertension Father   . Leukemia Sister   . Hypertension Sister   . Hypertension Brother   . Breast cancer Paternal Aunt   . Diabetes Maternal Grandmother   . Stroke Maternal Grandmother   . Stroke Paternal Grandmother   . Multiple sclerosis Sister   .  Hypertension Sister   . Hypertension Sister   . Hypertension Brother   . Hypertension Brother    History  Substance Use Topics  . Smoking status: Never Smoker   . Smokeless tobacco: Never Used  . Alcohol Use: No   OB History    Gravida Para Term Preterm AB TAB SAB Ectopic Multiple Living   3 3 3       3      Review of Systems  Constitutional: Negative for fever and chills.  HENT: Negative for congestion.   Eyes: Negative for visual disturbance.  Respiratory: Positive for shortness of breath.   Cardiovascular: Positive for leg swelling. Negative for chest pain.  Gastrointestinal: Negative for vomiting and abdominal pain.  Genitourinary: Negative for dysuria and flank pain.  Musculoskeletal: Negative for back pain, neck pain and neck stiffness.  Skin: Negative for rash.  Neurological: Negative for light-headedness and headaches.      Allergies  Penicillins  Home Medications   Prior to Admission medications   Medication Sig Start Date End Date Taking? Authorizing Provider  albuterol (PROVENTIL HFA;VENTOLIN HFA) 108 (90 BASE) MCG/ACT inhaler Inhale 1-2 puffs into the lungs every 6 (six) hours as needed  for wheezing or shortness of breath. 05/15/14  Yes Comer Locket, PA-C  aspirin-acetaminophen-caffeine (EXCEDRIN MIGRAINE) [phone] MG per tablet Take 2 tablets by mouth every 6 (six) hours as needed for headache or migraine.   Yes Historical Provider, MD  ibuprofen (ADVIL,MOTRIN) 200 MG tablet Take 800-1,000 mg by mouth every 8 (eight) hours as needed for moderate pain.   Yes Historical Provider, MD  lisinopril-hydrochlorothiazide (PRINZIDE,ZESTORETIC) 20-25 MG per tablet Take 1 tablet by mouth daily. 09/09/11  Yes Dorie Rank, MD  venlafaxine [facility]) 37.5 MG tablet Take 1 tablet (37.5 mg total) by mouth 2 (two) times daily with a meal. 09/30/13  Yes Brook E Yisroel Ramming, MD  Vitamin D, Ergocalciferol, (DRISDOL) 50000 UNITS CAPS Take 50,000 Units by mouth 2 (two) times a  week. Sunday and Wednesday   Yes Historical Provider, MD  chlorpheniramine-HYDROcodone (TUSSIONEX PENNKINETIC ER) 10-8 MG/5ML LQCR Take 5 mLs by mouth every 12 (twelve) hours as needed for cough (Cough). Patient not taking: Reported on 08/13/2014 05/15/14   Comer Locket, PA-C  lisinopril-hydrochlorothiazide (PRINZIDE,ZESTORETIC) 20-25 MG per tablet Take 1 tablet by mouth daily. 08/13/14   Elnora Morrison, MD  venlafaxine (EFFEXOR) 37.5 MG tablet Take 1 tablet (37.5 mg total) by mouth 2 (two) times daily with a meal. 08/13/14   Elnora Morrison, MD   BP 181/113 mmHg  Pulse 67  Temp(Src) 98.4 F (36.9 C)  Resp 18  Ht 5\' 4"  (1.626 m)  Wt 290 lb (131.543 kg)  BMI 49.75 kg/m2  SpO2 100%  LMP 10/02/2007 Physical Exam  Constitutional: She is oriented to person, place, and time. She appears well-developed and well-nourished.  HENT:  Head: Normocephalic and atraumatic.  Eyes: Conjunctivae are normal. Right eye exhibits no discharge. Left eye exhibits no discharge.  Neck: Normal range of motion. Neck supple. No tracheal deviation present.  Cardiovascular: Normal rate and regular rhythm.   Pulmonary/Chest: Effort normal and breath sounds normal.  Abdominal: Soft. She exhibits no distension. There is no tenderness. There is no guarding.  Musculoskeletal: She exhibits edema (mild lower extremity is bilateral nontender).  Neurological: She is alert and oriented to person, place, and time. No cranial nerve deficit.  Skin: Skin is warm. No rash noted.  Psychiatric: She has a normal mood and affect.  Nursing note and vitals reviewed.   ED Course  Procedures (including critical care time) Labs Review Labs Reviewed  COMPREHENSIVE METABOLIC PANEL - Abnormal; Notable for the following:    Creatinine, Ser 1.26 (*)    GFR calc non Af Amer 49 (*)    GFR calc Af Amer 57 (*)    All other components within normal limits  CBC - Abnormal; Notable for the following:    RBC 3.74 (*)    Hemoglobin 11.7 (*)     All other components within normal limits  URINALYSIS, ROUTINE W REFLEX MICROSCOPIC (NOT AT [facility])  I-STAT TROPOININ, ED    Imaging Review No results found.   EKG Interpretation   Date/Time:  Sunday August 13 2014 18:02:16 EDT Ventricular Rate:  77 PR Interval:  119 QRS Duration: 77 QT Interval:  412 QTC Calculation: 466 R Axis:   60 Text Interpretation:  Sinus rhythm Borderline short PR interval Low  voltage, precordial leads since last tracing no significant change  Confirmed by Sabra Heck  MD, BRIAN ([id number]) on 08/13/2014 6:11:34 PM Also  confirmed by Reather Converse  MD, Hortense Cantrall ([id number])  on 08/13/2014 8:11:33 PM      MDM   Final diagnoses:  Essential  hypertension  Bilateral leg edema  Cough   Well-appearing patient with uncontrolled high blood pressure due to noncompliance with medications secondary to no primary physician. No signs of acute pulmonary edema on exam. Mild worsening kidney function. EKG no acute findings. Patient comfortable with refill of blood pressure prescription and close follow-up with her new primary doctor. Strict reasons to return given. Currently no chest pain or shortness of breath. Results and differential diagnosis were discussed with the patient/parent/guardian. Close follow up outpatient was discussed, comfortable with the plan.   Medications  lisinopril (PRINIVIL,ZESTRIL) tablet 20 mg (20 mg Oral Given 08/13/14 2133)  hydrochlorothiazide (MICROZIDE) capsule 25 mg (25 mg Oral Given 08/13/14 2131)    Filed Vitals:   08/13/14 1754 08/13/14 2134  BP: 174/92 181/113  Pulse: 85 67  Temp: 98.4 F (36.9 C)   TempSrc:  Oral  Resp: 16 18  Height: 5\' 4"  (1.626 m)   Weight: 290 lb (131.543 kg)   SpO2: 99% 100%    Final diagnoses:  Essential hypertension  Bilateral leg edema  Cough       Elnora Morrison, MD 08/13/14 2346

## 2014-09-01 ENCOUNTER — Telehealth: Payer: Self-pay | Admitting: *Deleted

## 2014-09-01 NOTE — Telephone Encounter (Signed)
Unable to reach patient at time of Pre-Visit Call.  Left message for patient to return call when available.    

## 2014-09-05 ENCOUNTER — Telehealth: Payer: Self-pay | Admitting: Family

## 2014-09-05 ENCOUNTER — Encounter: Payer: Self-pay | Admitting: Family

## 2014-09-05 ENCOUNTER — Ambulatory Visit (INDEPENDENT_AMBULATORY_CARE_PROVIDER_SITE_OTHER): Payer: BLUE CROSS/BLUE SHIELD | Admitting: Family

## 2014-09-05 ENCOUNTER — Telehealth: Payer: Self-pay | Admitting: *Deleted

## 2014-09-05 VITALS — BP 120/84 | HR 67 | Temp 98.3°F | Resp 16 | Ht 64.25 in | Wt 304.8 lb

## 2014-09-05 DIAGNOSIS — R002 Palpitations: Secondary | ICD-10-CM | POA: Diagnosis not present

## 2014-09-05 DIAGNOSIS — I10 Essential (primary) hypertension: Secondary | ICD-10-CM

## 2014-09-05 DIAGNOSIS — N950 Postmenopausal bleeding: Secondary | ICD-10-CM

## 2014-09-05 DIAGNOSIS — R0600 Dyspnea, unspecified: Secondary | ICD-10-CM

## 2014-09-05 DIAGNOSIS — G8929 Other chronic pain: Secondary | ICD-10-CM

## 2014-09-05 DIAGNOSIS — R079 Chest pain, unspecified: Secondary | ICD-10-CM

## 2014-09-05 DIAGNOSIS — F329 Major depressive disorder, single episode, unspecified: Secondary | ICD-10-CM

## 2014-09-05 DIAGNOSIS — E559 Vitamin D deficiency, unspecified: Secondary | ICD-10-CM

## 2014-09-05 DIAGNOSIS — G4733 Obstructive sleep apnea (adult) (pediatric): Secondary | ICD-10-CM

## 2014-09-05 DIAGNOSIS — F419 Anxiety disorder, unspecified: Secondary | ICD-10-CM

## 2014-09-05 DIAGNOSIS — F32A Depression, unspecified: Secondary | ICD-10-CM

## 2014-09-05 DIAGNOSIS — R413 Other amnesia: Secondary | ICD-10-CM

## 2014-09-05 DIAGNOSIS — M549 Dorsalgia, unspecified: Secondary | ICD-10-CM

## 2014-09-05 DIAGNOSIS — F418 Other specified anxiety disorders: Secondary | ICD-10-CM

## 2014-09-05 DIAGNOSIS — M1990 Unspecified osteoarthritis, unspecified site: Secondary | ICD-10-CM

## 2014-09-05 MED ORDER — NYSTATIN 100000 UNIT/GM EX POWD: CUTANEOUS | Status: DC

## 2014-09-05 MED ORDER — VENLAFAXINE HCL ER 75 MG PO CP24: 75.0000 mg | ORAL_CAPSULE | Freq: Every day | ORAL | Status: DC

## 2014-09-05 MED ORDER — MELOXICAM 7.5 MG PO TABS: 7.5000 mg | ORAL_TABLET | Freq: Every day | ORAL | Status: DC

## 2014-09-05 NOTE — Progress Notes (Signed)
Pre visit review using our clinic review tool, if applicable. No additional management support is needed unless otherwise documented below in the visit note. 
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
## 2014-09-05 NOTE — Telephone Encounter (Signed)
Could you please contact the sleep center in [place] and request sleep study report (2009 and 2010).

## 2014-09-05 NOTE — Telephone Encounter (Signed)
Pt reported as she was leaving from office visit today that she forgot to mention memory loss that has been worsening over the last 6 months. Reports that she will be talking to someone she has known for years and suddenly forget their name. Reported concern to PCP and received verbal to add vitamin b12 and RPR lab tests for further evaluation. Lab orders entered.

## 2014-09-05 NOTE — Patient Instructions (Addendum)
Please complete lab work prior to leaving.  Try adding claritin 10mg  once daily.  You will be contacted about your stress test and echocardiogram. Please follow up in 1 month for a complete physical. Welcome to [facility] [place]!

## 2014-09-06 ENCOUNTER — Encounter: Payer: Self-pay | Admitting: Family

## 2014-09-06 DIAGNOSIS — G4733 Obstructive sleep apnea (adult) (pediatric): Secondary | ICD-10-CM

## 2014-09-06 DIAGNOSIS — F411 Generalized anxiety disorder: Secondary | ICD-10-CM | POA: Insufficient documentation

## 2014-09-06 DIAGNOSIS — M1990 Unspecified osteoarthritis, unspecified site: Secondary | ICD-10-CM | POA: Insufficient documentation

## 2014-09-06 DIAGNOSIS — E559 Vitamin D deficiency, unspecified: Secondary | ICD-10-CM

## 2014-09-06 DIAGNOSIS — F419 Anxiety disorder, unspecified: Secondary | ICD-10-CM

## 2014-09-06 DIAGNOSIS — N950 Postmenopausal bleeding: Secondary | ICD-10-CM | POA: Insufficient documentation

## 2014-09-06 DIAGNOSIS — I10 Essential (primary) hypertension: Secondary | ICD-10-CM | POA: Insufficient documentation

## 2014-09-06 DIAGNOSIS — F329 Major depressive disorder, single episode, unspecified: Secondary | ICD-10-CM | POA: Insufficient documentation

## 2014-09-06 HISTORY — DX: Obstructive sleep apnea (adult) (pediatric): G47.33

## 2014-09-06 HISTORY — DX: Vitamin D deficiency, unspecified: E55.9

## 2014-09-06 HISTORY — DX: Generalized anxiety disorder: F41.1

## 2014-09-06 HISTORY — DX: Essential (primary) hypertension: I10

## 2014-09-06 LAB — BASIC METABOLIC PANEL
BUN: 16 mg/dL (ref 6–23)
CO2: 28 meq/L (ref 19–32)
Calcium: 9.4 mg/dL (ref 8.4–10.5)
Chloride: 101 mEq/L (ref 96–112)
Creatinine, Ser: 1.03 mg/dL (ref 0.40–1.20)
GFR: 72.78 mL/min (ref >60.00)
Glucose, Bld: 79 mg/dL (ref 70–99)
Potassium: 3.5 mEq/L (ref 3.5–5.1)
SODIUM: 138 meq/L (ref 135–145)

## 2014-09-06 LAB — CBC WITH DIFFERENTIAL/PLATELET
BASOS PCT: 2.5 % (ref 0.0–3.0)
Basophils Absolute: 0.1 10*3/uL (ref 0.0–0.1)
Eosinophils Absolute: 0.1 10*3/uL (ref 0.0–0.7)
Eosinophils Relative: 2.5 % (ref 0.0–5.0)
HCT: 39.1 % (ref 36.0–46.0)
Hemoglobin: 12.9 g/dL (ref 12.0–15.0)
Lymphocytes Relative: 60.1 % — ABNORMAL HIGH (ref 12.0–46.0)
Lymphs Abs: 2.7 10*3/uL (ref 0.7–4.0)
MCHC: 33 g/dL (ref 30.0–36.0)
MCV: 96.6 fl (ref 78.0–100.0)
MONO ABS: 0.4 10*3/uL (ref 0.1–1.0)
Monocytes Relative: 9.9 % (ref 3.0–12.0)
Neutro Abs: 1.1 10*3/uL — ABNORMAL LOW (ref 1.4–7.7)
Neutrophils Relative %: 25 % — ABNORMAL LOW (ref 43.0–77.0)
PLATELETS: 222 10*3/uL (ref 150.0–400.0)
RBC: 4.05 Mil/uL (ref 3.87–5.11)
RDW: 13.4 % (ref 11.5–15.5)
WBC: 4.5 10*3/uL (ref 4.0–10.5)

## 2014-09-06 LAB — VITAMIN B12: Vitamin B-12: 501 pg/mL (ref 211–911)

## 2014-09-06 LAB — TSH: TSH: 2.03 u[IU]/mL (ref 0.35–4.50)

## 2014-09-06 LAB — VITAMIN D 25 HYDROXY (VIT D DEFICIENCY, FRACTURES): VITD: 15.27 ng/mL — AB (ref 30.00–100.00)

## 2014-09-06 NOTE — Assessment & Plan Note (Signed)
Resume meloxicam.  

## 2014-09-06 NOTE — Assessment & Plan Note (Signed)
Refer to GYN, need to rule out underlying malignancy and I explained this to the patient.

## 2014-09-06 NOTE — Telephone Encounter (Signed)
-----   Message from Synthia Innocent sent at 09/06/2014  9:30 AM EDT ----- Insurance did not approve Stress test, insurance requiring peer to peer review. Please call [facility] at [phone], insurance ID [id number] W. Thanks

## 2014-09-06 NOTE — Assessment & Plan Note (Signed)
BP stable on hctz, continue same, obtain follow up bmet.

## 2014-09-06 NOTE — Telephone Encounter (Signed)
Also, Vitamin D level is low.  Advise patient to begin vit D 50000 units once weekly for 12 weeks, then repeat vit D level (dx Vit D deficiency).    Other labs look good.

## 2014-09-06 NOTE — Telephone Encounter (Signed)
Notified pt of below results and cardiology referral. Pt voices understanding and wants to know what was abnormal about her EKG.  OK to leave detailed message on cell#.  Please advise. Lab appt scheduled for 11/29/14 at 9am and future lab order entered.

## 2014-09-06 NOTE — Assessment & Plan Note (Signed)
Will request previous sleep study and then request home CPAP once I see recommended settings.

## 2014-09-06 NOTE — Assessment & Plan Note (Signed)
rx meloxicam. Discussed importance of weight loss in helping with her OA pain. Discussed regular exercise- stationary bike, water aerobics, diet.

## 2014-09-06 NOTE — Assessment & Plan Note (Signed)
Fair control.  Continue effexor. Consider adding SSRI next visit if she continues to have c/o irritability.

## 2014-09-06 NOTE — Telephone Encounter (Signed)
I reviewed EKG as well, mildly abnormal. I will send to cardiology and will defer further work up/stress test to them.

## 2014-09-07 NOTE — Telephone Encounter (Signed)
Attempted to reach pt on cell# but voice mailbox is full. Will try again later.

## 2014-09-07 NOTE — Telephone Encounter (Signed)
Notified pt and she voices understanding. 

## 2014-09-07 NOTE — Telephone Encounter (Signed)
She is noted to have "short PR" interval which has to do with the electrical conduction in her heart. This is unlikely to be an issue for her but I wanted the cardiologist to review.

## 2014-09-07 NOTE — Telephone Encounter (Signed)
Error

## 2014-09-10 ENCOUNTER — Telehealth: Payer: Self-pay | Admitting: Family

## 2014-09-10 NOTE — Telephone Encounter (Signed)
It looks like the lab did not process rpr. Is it possible to add on please?

## 2014-09-11 NOTE — Telephone Encounter (Signed)
Yes pls

## 2014-09-11 NOTE — Telephone Encounter (Signed)
Test cannot be added as [facility] runs these and they state they never received specimen. Pt has f/u on 10/10/14.  Do we want to do it then?

## 2014-09-14 ENCOUNTER — Telehealth (HOSPITAL_COMMUNITY): Payer: Self-pay

## 2014-09-14 NOTE — Telephone Encounter (Signed)
Unable to Left message on voicemail in reference to upcoming appointment scheduled for 09-19-2014 due to mailbox full. Phone number given for a call back so details instructions can be given. Oletta Lamas, Montrail Mehrer A

## 2014-09-18 ENCOUNTER — Telehealth (HOSPITAL_COMMUNITY): Payer: Self-pay

## 2014-09-18 NOTE — Telephone Encounter (Signed)
Patient given detailed instructions per Myocardial Perfusion Study Information Sheet for test on 09-19-2014 at 11:45. Patient Notified to arrive 15 minutes early, and that it is imperative to arrive on time for appointment to keep from having the test rescheduled. Patient verbalized understanding. Hailey Vance, Antjuan Rothe A

## 2014-09-19 ENCOUNTER — Ambulatory Visit (HOSPITAL_COMMUNITY): Payer: BLUE CROSS/BLUE SHIELD | Attending: Cardiology

## 2014-09-19 DIAGNOSIS — R001 Bradycardia, unspecified: Secondary | ICD-10-CM | POA: Diagnosis not present

## 2014-09-19 DIAGNOSIS — R079 Chest pain, unspecified: Secondary | ICD-10-CM | POA: Insufficient documentation

## 2014-09-19 DIAGNOSIS — I10 Essential (primary) hypertension: Secondary | ICD-10-CM | POA: Insufficient documentation

## 2014-09-19 DIAGNOSIS — R0609 Other forms of dyspnea: Secondary | ICD-10-CM | POA: Diagnosis not present

## 2014-09-19 LAB — MYOCARDIAL PERFUSION IMAGING
CHL CUP NUCLEAR SRS: 1
CHL CUP RESTING HR STRESS: 57 {beats}/min
CSEPPHR: 97 {beats}/min
LV sys vol: 27 mL
LVDIAVOL: 92 mL
NUC STRESS TID: 0.97
RATE: 0.28
SDS: 4
SSS: 5

## 2014-09-19 MED ORDER — REGADENOSON 0.4 MG/5ML IV SOLN
0.4000 mg | Freq: Once | INTRAVENOUS | Status: AC
  Administered 2014-09-19: 0.4 mg via INTRAVENOUS

## 2014-09-19 MED ORDER — TECHNETIUM TC 99M SESTAMIBI GENERIC - CARDIOLITE
31.4000 | Freq: Once | INTRAVENOUS | Status: AC | PRN
  Administered 2014-09-19: 31 via INTRAVENOUS

## 2014-09-19 MED ORDER — TECHNETIUM TC 99M SESTAMIBI GENERIC - CARDIOLITE
10.7000 | Freq: Once | INTRAVENOUS | Status: AC | PRN
  Administered 2014-09-19: 11 via INTRAVENOUS

## 2014-09-20 ENCOUNTER — Telehealth: Payer: Self-pay | Admitting: Family

## 2014-09-20 ENCOUNTER — Ambulatory Visit (HOSPITAL_COMMUNITY): Payer: BLUE CROSS/BLUE SHIELD

## 2014-09-20 NOTE — Telephone Encounter (Signed)
pre visit letter mailed 09/19/14

## 2014-09-21 ENCOUNTER — Encounter: Payer: Self-pay | Admitting: Family

## 2014-10-05 ENCOUNTER — Encounter: Payer: Self-pay | Admitting: Family

## 2014-10-05 DIAGNOSIS — N939 Abnormal uterine and vaginal bleeding, unspecified: Secondary | ICD-10-CM

## 2014-10-05 NOTE — Telephone Encounter (Signed)
See mychart.  

## 2014-10-09 ENCOUNTER — Encounter: Payer: Self-pay | Admitting: *Deleted

## 2014-10-09 ENCOUNTER — Telehealth: Payer: Self-pay | Admitting: *Deleted

## 2014-10-09 NOTE — Telephone Encounter (Signed)
Pre-Visit Call completed with patient and chart updated.   Pre-Visit Info documented in Specialty Comments under SnapShot.    

## 2014-10-10 ENCOUNTER — Ambulatory Visit (INDEPENDENT_AMBULATORY_CARE_PROVIDER_SITE_OTHER): Payer: BLUE CROSS/BLUE SHIELD | Admitting: Family

## 2014-10-10 ENCOUNTER — Encounter: Payer: Self-pay | Admitting: Family

## 2014-10-10 ENCOUNTER — Telehealth: Payer: Self-pay | Admitting: *Deleted

## 2014-10-10 VITALS — BP 124/86 | HR 77 | Temp 98.1°F | Resp 16 | Ht 64.25 in | Wt 303.0 lb

## 2014-10-10 DIAGNOSIS — Z0000 Encounter for general adult medical examination without abnormal findings: Secondary | ICD-10-CM | POA: Diagnosis not present

## 2014-10-10 DIAGNOSIS — Z23 Encounter for immunization: Secondary | ICD-10-CM | POA: Diagnosis not present

## 2014-10-10 LAB — URINALYSIS, ROUTINE W REFLEX MICROSCOPIC
Bilirubin Urine: NEGATIVE
HGB URINE DIPSTICK: NEGATIVE
Ketones, ur: NEGATIVE
Leukocytes, UA: NEGATIVE
Nitrite: NEGATIVE
PH: 6.5 (ref 5.0–8.0)
RBC / HPF: NONE SEEN (ref 0–?)
Specific Gravity, Urine: 1.02 (ref 1.000–1.030)
TOTAL PROTEIN, URINE-UPE24: NEGATIVE
Urine Glucose: NEGATIVE
Urobilinogen, UA: 0.2 (ref 0.0–1.0)

## 2014-10-10 LAB — LIPID PANEL
Cholesterol: 195 mg/dL (ref 0–200)
HDL: 75.4 mg/dL (ref >39.00)
LDL Cholesterol: 106 mg/dL — ABNORMAL HIGH (ref 0–99)
NonHDL: 119.14
TRIGLYCERIDES: 66 mg/dL (ref 0.0–149.0)
Total CHOL/HDL Ratio: 3
VLDL: 13.2 mg/dL (ref 0.0–40.0)

## 2014-10-10 MED ORDER — VITAMIN D (ERGOCALCIFEROL) 1.25 MG (50000 UNIT) PO CAPS: 50000.0000 [IU] | ORAL_CAPSULE | ORAL | Status: DC

## 2014-10-10 NOTE — Telephone Encounter (Signed)
Received fax from [facility] wanting clarification of Vitamin D directions. Rx says take 1 capsule by mouth every 7 days.  Sunday and Wednesday.  Pharmacy wants to know which is correct?

## 2014-10-10 NOTE — Patient Instructions (Addendum)
Download My Fitness Pal App to help you count calories and set up a diet plan with goal weight loss of 1-2 pounds/week.   Follow up in 3 months for vitamin D level (lab visit) Follow up in 6 months.

## 2014-10-10 NOTE — Progress Notes (Signed)
Subjective:    Patient ID: Hailey Vance, female    DOB: 1966/01/26, 49 y.o.   MRN: [id number]  HPI  Hailey Vance is a 49 yr old female who presents today for cpx.    Immunizations: due for Tdap Diet: could be better, eats late Exercise: not exercising due to joint pain- considering pool Colonoscopy: due Pap Smear: 2013- [facility]- believes that she had one last week  Mammogram:  Up to date Wt Readings from Last 3 Encounters:  10/10/14 303 lb (137.44 kg)  09/19/14 304 lb (137.893 kg)  09/05/14 304 lb 12.8 oz (138.256 kg)      Review of Systems  Constitutional: Negative for unexpected weight change.  HENT: Negative for hearing loss and rhinorrhea.   Eyes: Negative for visual disturbance.  Respiratory: Negative for cough.   Cardiovascular:       Mild LE edema  Gastrointestinal: Negative for nausea, diarrhea and constipation.  Genitourinary: Negative for dysuria and frequency.       Menstrual bleeding stopped  Musculoskeletal: Positive for arthralgias. Negative for myalgias.       Bilateral knees, ankles, back pain- chronic  Skin: Negative for rash.  Neurological: Negative for headaches.  Hematological: Negative for adenopathy.  Psychiatric/Behavioral: Negative for dysphoric mood and agitation.   Past Medical History  Diagnosis Date  . Hypertension   . Sleep apnea     CPAP  . Blood transfusion 1990    [place] - 5 units transfused  . Headache(784.0)     otc med prn  . Plantar fasciitis of left foot   . Arthritis     osteoarthritis - knees  . Chronic back pain   . Blood transfusion without reported diagnosis 1990    after a c/section  . Anxiety   . Depression   . Endometriosis   . Fibroid   . Vitamin D deficiency 09/06/2014    History   Social History  . Marital Status: Divorced    Spouse Name: N/A  . Number of Children: N/A  . Years of Education: N/A   Occupational History  . Not on file.   Social History Main Topics  .  Smoking status: Never Smoker   . Smokeless tobacco: Never Used  . Alcohol Use: No  . Drug Use: No  . Sexual Activity:    Partners: Male    Birth Control/ Protection: Surgical     Comment: Tubal   Other Topics Concern  . Not on file   Social History Narrative   Divorced   Lives with son and grand-daughter   Hailey Vance (lives in [place])   Hailey Vance lives with patient   14- Daughter Hailey Vance (lives in [place])   Works at [employer]   Completed some college   Careers adviser       Past Surgical History  Procedure Laterality Date  . Cesarean section  1990    x 1  . Hernia repair    . Bilateral oophorectomy      Complete removal of left ovary and partial removal of right ovary.  . Knee surgery      right knee  . Svd      x 2  . Tubal ligation  1990    Family History  Problem Relation Age of Onset  . Lung cancer Mother 70    dec  . Cancer Mother   . Hypertension Mother   . Arthritis Mother   .  Cancer Father 4    prostate  . Hypertension Father   . Leukemia Sister   . Hypertension Sister   . Arthritis Sister   . Hyperlipidemia Sister   . Hypertension Brother   . Arthritis Brother   . Breast cancer Paternal Aunt   . Diabetes Maternal Grandmother   . Stroke Maternal Grandmother   . Heart attack Paternal Grandmother   . Multiple sclerosis Sister   . Hypertension Sister   . Hypertension Sister   . Hypertension Brother   . Hypertension Brother     Allergies  Allergen Reactions  . Penicillins Itching    Current Outpatient Prescriptions on File Prior to Visit  Medication Sig Dispense Refill  . albuterol (PROVENTIL HFA;VENTOLIN HFA) 108 (90 BASE) MCG/ACT inhaler Inhale 1-2 puffs into the lungs every 6 (six) hours as needed for wheezing or shortness of breath. 1 Inhaler 0  . aspirin-acetaminophen-caffeine (EXCEDRIN MIGRAINE) 818-299-37 MG per tablet Take 2 tablets by mouth every 6 (six) hours as needed for headache or  migraine.    Marland Kitchen ibuprofen (ADVIL,MOTRIN) 200 MG tablet Take 800-1,000 mg by mouth every 8 (eight) hours as needed for moderate pain.    Marland Kitchen lisinopril-hydrochlorothiazide (PRINZIDE,ZESTORETIC) 20-25 MG per tablet Take 1 tablet by mouth daily. 30 tablet 3  . meloxicam (MOBIC) 7.5 MG tablet Take 1 tablet (7.5 mg total) by mouth daily. 30 tablet 2  . nystatin (MYCOSTATIN/NYSTOP) 100000 UNIT/GM POWD Apply twice daily to affected area as needed 30 g 1  . venlafaxine XR (EFFEXOR-XR) 75 MG 24 hr capsule Take 1 capsule (75 mg total) by mouth daily with breakfast. 60 capsule 1  . [DISCONTINUED] LISINOPRIL PO Take by mouth.     No current facility-administered medications on file prior to visit.    BP 124/86 mmHg  Pulse 77  Temp(Src) 98.1 F (36.7 C) (Oral)  Resp 16  Ht 5' 4.25" (1.632 m)  Wt 303 lb (137.44 kg)  BMI 51.60 kg/m2  SpO2 98%  LMP 10/02/2007       Objective:   Physical Exam Physical Exam  Constitutional: She is oriented to person, place, and time. She appears well-developed and well-nourished. No distress.  HENT:  Head: Normocephalic and atraumatic.  Right Ear: Tympanic membrane and ear canal normal.  Left Ear: Tympanic membrane and ear canal normal.  Mouth/Throat: Oropharynx is clear and moist.  Eyes: Pupils are equal, round, and reactive to light. No scleral icterus.  Neck: Normal range of motion. No thyromegaly present.  Cardiovascular: Normal rate and regular rhythm.   No murmur heard. Pulmonary/Chest: Effort normal and breath sounds normal. No respiratory distress. He has no wheezes. She has no rales. She exhibits no tenderness.  Abdominal: Soft. Bowel sounds are normal. He exhibits no distension and no mass. There is no tenderness. There is no rebound and no guarding.  Musculoskeletal: She exhibits no edema.  Lymphadenopathy:    She has no cervical adenopathy.  Neurological: She is alert and oriented to person, place, and time. She exhibits normal muscle tone.  Coordination normal.  Skin: Skin is warm and dry.  Psychiatric: She has a normal mood and affect. Her behavior is normal. Judgment and thought content normal.  Breasts: Examined lying Right: Without masses, retractions, discharge or axillary adenopathy.  Left: Without masses, retractions, discharge or axillary adenopathy.         Assessment & Plan:          Assessment & Plan:

## 2014-10-10 NOTE — Telephone Encounter (Signed)
Once every 7 days please.

## 2014-10-10 NOTE — Telephone Encounter (Signed)
Notified Pam at [facility], med list updated.

## 2014-10-10 NOTE — Assessment & Plan Note (Addendum)
Immunizations reviewed- Tdap today.  Continue healthy diet, exercise.  Obtain routine labs.  Refer for mammogram. Discussed diet/exercise/weight loss.

## 2014-10-10 NOTE — Progress Notes (Signed)
Pre visit review using our clinic review tool, if applicable. No additional management support is needed unless otherwise documented below in the visit note. 

## 2014-10-12 ENCOUNTER — Encounter: Payer: Self-pay | Admitting: Family

## 2014-10-13 ENCOUNTER — Ambulatory Visit (HOSPITAL_BASED_OUTPATIENT_CLINIC_OR_DEPARTMENT_OTHER)
Admission: RE | Admit: 2014-10-13 | Discharge: 2014-10-13 | Disposition: A | Discharge status: Home / Self Care | Payer: BLUE CROSS/BLUE SHIELD | Source: Ambulatory Visit | Attending: Family | Admitting: Family

## 2014-10-13 ENCOUNTER — Ambulatory Visit: Payer: BC Managed Care – PPO | Admitting: Obstetrics and Gynecology

## 2014-10-13 DIAGNOSIS — Z1231 Encounter for screening mammogram for malignant neoplasm of breast: Secondary | ICD-10-CM | POA: Diagnosis present

## 2014-10-13 DIAGNOSIS — Z0000 Encounter for general adult medical examination without abnormal findings: Secondary | ICD-10-CM

## 2014-10-16 MED ORDER — LISINOPRIL-HYDROCHLOROTHIAZIDE 20-25 MG PO TABS: 1.0000 | ORAL_TABLET | Freq: Every day | ORAL | Status: DC

## 2014-10-25 ENCOUNTER — Ambulatory Visit (INDEPENDENT_AMBULATORY_CARE_PROVIDER_SITE_OTHER): Payer: BLUE CROSS/BLUE SHIELD | Admitting: Cardiovascular Disease

## 2014-10-25 ENCOUNTER — Encounter: Payer: Self-pay | Admitting: Cardiovascular Disease

## 2014-10-25 VITALS — BP 122/98 | HR 75 | Ht 64.0 in | Wt 302.8 lb

## 2014-10-25 DIAGNOSIS — I10 Essential (primary) hypertension: Secondary | ICD-10-CM

## 2014-10-25 DIAGNOSIS — R079 Chest pain, unspecified: Secondary | ICD-10-CM

## 2014-10-25 DIAGNOSIS — R0602 Shortness of breath: Secondary | ICD-10-CM | POA: Diagnosis not present

## 2014-10-25 NOTE — Assessment & Plan Note (Addendum)
History of atypical chest pain with a recent Myoview that showed breast attenuation artifact without evidence of ischemia and normal LV function. I reassured Hailey Vance that it's unlikely that her pain is related to her heart.

## 2014-10-25 NOTE — Patient Instructions (Signed)
  We will see you back in follow up only as needed.   Dr Gwenlyn Found has ordered:  Echocardiogram. Echocardiography is a painless test that uses sound waves to create images of your heart. It provides your doctor with information about the size and shape of your heart and how well your heart's chambers and valves are working. This procedure takes approximately one hour. There are no restrictions for this procedure. This test is performed at [address] floor.

## 2014-10-25 NOTE — Progress Notes (Signed)
10/25/2014 Hailey Vance   [birth year]  [id number]  Primary Physician Nance Pear., NP Primary Cardiologist: Lorretta Harp MD Renae Gloss    HPI:  [name] is a 49 year old severely overweight divorced African-American female mother of 3 children who works at the [employer]. She was referred by Dr. Inda Castle for atypical chest pain. Her only cardiovascular risk factor is treated hypertension. She does have obstructive sleep apnea on C Pap in the past. She has developed atypical chest pain which last for hours at a time. She also has some shortness of breath. A recent Myoview stress test performed 09/19/14 showed what appeared to be breast attenuation artifact without evidence of ischemia and normal systolic function.   Current Outpatient Prescriptions  Medication Sig Dispense Refill  . albuterol (PROVENTIL HFA;VENTOLIN HFA) 108 (90 BASE) MCG/ACT inhaler Inhale 1-2 puffs into the lungs every 6 (six) hours as needed for wheezing or shortness of breath. 1 Inhaler 0  . aspirin-acetaminophen-caffeine (EXCEDRIN MIGRAINE) 540-086-76 MG per tablet Take 2 tablets by mouth every 6 (six) hours as needed for headache or migraine.    Marland Kitchen ibuprofen (ADVIL,MOTRIN) 200 MG tablet Take 800-1,000 mg by mouth every 8 (eight) hours as needed for moderate pain.    Marland Kitchen lisinopril-hydrochlorothiazide (PRINZIDE,ZESTORETIC) 20-25 MG per tablet Take 1 tablet by mouth daily. 30 tablet 6  . meloxicam (MOBIC) 7.5 MG tablet Take 1 tablet (7.5 mg total) by mouth daily. 30 tablet 2  . nystatin (MYCOSTATIN/NYSTOP) 100000 UNIT/GM POWD Apply twice daily to affected area as needed 30 g 1  . venlafaxine XR (EFFEXOR-XR) 75 MG 24 hr capsule Take 1 capsule (75 mg total) by mouth daily with breakfast. 60 capsule 1  . Vitamin D, Ergocalciferol, (DRISDOL) 50000 UNITS CAPS capsule Take 1 capsule (50,000 Units total) by mouth every 7 (seven) days. Sunday and Wednesday (Patient taking differently: Take  50,000 Units by mouth every 7 (seven) days. ) 12 capsule 0  . [DISCONTINUED] LISINOPRIL PO Take by mouth.     No current facility-administered medications for this visit.    Allergies  Allergen Reactions  . Penicillins Itching    Social History   Social History  . Marital Status: Divorced    Spouse Name: N/A  . Number of Children: N/A  . Years of Education: N/A   Occupational History  . Not on file.   Social History Main Topics  . Smoking status: Never Smoker   . Smokeless tobacco: Never Used  . Alcohol Use: No  . Drug Use: No  . Sexual Activity:    Partners: Male    Birth Control/ Protection: Surgical     Comment: Tubal   Other Topics Concern  . Not on file   Social History Narrative   Divorced   Lives with son and grand-daughter   [place] (lives in [place])   [place] lives with patient   73- Daughter Hailey Vance (lives in [place])   Works at [employer]   Completed some college   Careers adviser        Review of Systems: General: negative for chills, fever, night sweats or weight changes.  Cardiovascular: negative for chest pain, dyspnea on exertion, edema, orthopnea, palpitations, paroxysmal nocturnal dyspnea or shortness of breath Dermatological: negative for rash Respiratory: negative for cough or wheezing Urologic: negative for hematuria Abdominal: negative for nausea, vomiting, diarrhea, bright red blood per rectum, melena, or hematemesis Neurologic: negative for visual changes, syncope, or dizziness All  other systems reviewed and are otherwise negative except as noted above.    Blood pressure 122/98, pulse 75, height 5\' 4"  (1.626 m), weight 302 lb 12.8 oz (137.349 kg), last menstrual period 10/02/2007.  General appearance: alert and no distress Neck: no adenopathy, no carotid bruit, no JVD, supple, symmetrical, trachea midline and thyroid not enlarged, symmetric, no tenderness/mass/nodules Lungs: clear to  auscultation bilaterally Heart: regular rate and rhythm, S1, S2 normal, no murmur, click, rub or gallop Extremities: extremities normal, atraumatic, no cyanosis or edema  EKG normal sinus rhythm at 75 without ST or T-wave changes. I personally reviewed his EKG  ASSESSMENT AND PLAN:   HTN (hypertension) History of hypertension with blood pressure measured at 122/98. She is on lisinopril and hydrochlorothiazide. She has not taken her medicines for the last 2 days  Chest pain History of atypical chest pain with a recent Myoview that showed breast attenuation artifact without evidence of ischemia and normal LV function. I reassured Mrs. Hage that it's unlikely that her pain is related to her heart.      Lorretta Harp MD FACP,FACC,FAHA, [facility] 10/25/2014 12:57 PM

## 2014-10-25 NOTE — Assessment & Plan Note (Signed)
History of hypertension with blood pressure measured at 122/98. She is on lisinopril and hydrochlorothiazide. She has not taken her medicines for the last 2 days

## 2014-10-31 ENCOUNTER — Other Ambulatory Visit (HOSPITAL_COMMUNITY): Payer: BLUE CROSS/BLUE SHIELD

## 2014-11-22 ENCOUNTER — Other Ambulatory Visit: Payer: Self-pay | Admitting: Family

## 2014-11-29 ENCOUNTER — Other Ambulatory Visit (INDEPENDENT_AMBULATORY_CARE_PROVIDER_SITE_OTHER): Payer: BLUE CROSS/BLUE SHIELD

## 2014-11-29 DIAGNOSIS — E559 Vitamin D deficiency, unspecified: Secondary | ICD-10-CM | POA: Diagnosis not present

## 2014-11-29 LAB — VITAMIN D 25 HYDROXY (VIT D DEFICIENCY, FRACTURES): VITD: 22.94 ng/mL — AB (ref 30.00–100.00)

## 2014-11-30 ENCOUNTER — Telehealth: Payer: Self-pay | Admitting: Family

## 2014-11-30 DIAGNOSIS — E559 Vitamin D deficiency, unspecified: Secondary | ICD-10-CM

## 2014-11-30 NOTE — Telephone Encounter (Signed)
See mychart.  

## 2014-12-01 MED ORDER — VITAMIN D (ERGOCALCIFEROL) 1.25 MG (50000 UNIT) PO CAPS: ORAL_CAPSULE | ORAL | Status: DC

## 2014-12-01 NOTE — Telephone Encounter (Signed)
See above mychart message from pt. I will contact OB/GYN to find out what surgery and when. Pt had CPE with [place] 10/2014.  Will we still need to do an office visit for surgical clearance?

## 2014-12-01 NOTE — Addendum Note (Signed)
Addended by: Kelle Darting A on: 12/01/2014 04:17 PM   Modules accepted: Orders

## 2014-12-01 NOTE — Telephone Encounter (Signed)
Mychart message sent to pt.  I will call GYN [phone]) on Monday for additional information re: surgery.

## 2014-12-01 NOTE — Telephone Encounter (Signed)
Yes, pt will need surgical clearance visit.  Please increase vit D to twice weekly x 12 weeks, then repeat vit D level. Dx vit D def.

## 2014-12-04 ENCOUNTER — Telehealth: Payer: Self-pay | Admitting: Family

## 2014-12-04 NOTE — Telephone Encounter (Signed)
See 11/30/14 patient email.

## 2014-12-04 NOTE — Telephone Encounter (Signed)
Left message with Seth Bake to have nurse return my call re: type of surgery and date?Marland Kitchen

## 2014-12-04 NOTE — Telephone Encounter (Signed)
Attempted to reach [place] at below # and left message to return my call. Asked her to leave detailed information with receptionist if I am unavailable as to the type of surgery pt is needing and date of surgery if already scheduled?    Margot Ables at 12/04/2014 9:04 AM     Status: Signed       Expand All Collapse All   Returning you call for preop on pt.  Please call Caryl Pina @ [phone]

## 2014-12-04 NOTE — Telephone Encounter (Signed)
Returning you call for preop on pt.  Please call Caryl Pina @ [phone] x [phone]

## 2014-12-04 NOTE — Telephone Encounter (Signed)
Received call from [place] at [facility] stating pt is needing a hysteroscopy D & C dor post menopausal bleeding. Date has not been scheduled yet.

## 2014-12-20 ENCOUNTER — Ambulatory Visit (HOSPITAL_BASED_OUTPATIENT_CLINIC_OR_DEPARTMENT_OTHER)
Admission: RE | Admit: 2014-12-20 | Discharge: 2014-12-20 | Disposition: A | Discharge status: Home / Self Care | Payer: BLUE CROSS/BLUE SHIELD | Source: Ambulatory Visit | Attending: Family | Admitting: Family

## 2014-12-20 ENCOUNTER — Encounter: Payer: Self-pay | Admitting: Family

## 2014-12-20 ENCOUNTER — Telehealth: Payer: Self-pay | Admitting: *Deleted

## 2014-12-20 ENCOUNTER — Telehealth: Payer: Self-pay | Admitting: Family

## 2014-12-20 ENCOUNTER — Ambulatory Visit (INDEPENDENT_AMBULATORY_CARE_PROVIDER_SITE_OTHER): Payer: BLUE CROSS/BLUE SHIELD | Admitting: Family

## 2014-12-20 VITALS — BP 132/90 | HR 66 | Temp 98.5°F | Ht 64.0 in | Wt 302.8 lb

## 2014-12-20 DIAGNOSIS — Z01818 Encounter for other preprocedural examination: Secondary | ICD-10-CM

## 2014-12-20 DIAGNOSIS — I10 Essential (primary) hypertension: Secondary | ICD-10-CM | POA: Insufficient documentation

## 2014-12-20 DIAGNOSIS — G4733 Obstructive sleep apnea (adult) (pediatric): Secondary | ICD-10-CM

## 2014-12-20 LAB — BASIC METABOLIC PANEL
BUN: 20 mg/dL (ref 6–23)
CHLORIDE: 103 meq/L (ref 96–112)
CO2: 32 meq/L (ref 19–32)
Calcium: 9.2 mg/dL (ref 8.4–10.5)
Creatinine, Ser: 1.02 mg/dL (ref 0.40–1.20)
GFR: 73.52 mL/min (ref >60.00)
Glucose, Bld: 95 mg/dL (ref 70–99)
POTASSIUM: 3.9 meq/L (ref 3.5–5.1)
SODIUM: 140 meq/L (ref 135–145)

## 2014-12-20 LAB — CBC WITH DIFFERENTIAL/PLATELET
BASOS PCT: 0.6 % (ref 0.0–3.0)
Basophils Absolute: 0 10*3/uL (ref 0.0–0.1)
EOS ABS: 0.2 10*3/uL (ref 0.0–0.7)
EOS PCT: 4.5 % (ref 0.0–5.0)
HEMATOCRIT: 37.7 % (ref 36.0–46.0)
HEMOGLOBIN: 12.3 g/dL (ref 12.0–15.0)
LYMPHS PCT: 54 % — AB (ref 12.0–46.0)
Lymphs Abs: 2.5 10*3/uL (ref 0.7–4.0)
MCHC: 32.8 g/dL (ref 30.0–36.0)
MCV: 97.1 fl (ref 78.0–100.0)
MONOS PCT: 10.9 % (ref 3.0–12.0)
Monocytes Absolute: 0.5 10*3/uL (ref 0.1–1.0)
NEUTROS ABS: 1.4 10*3/uL (ref 1.4–7.7)
Neutrophils Relative %: 30 % — ABNORMAL LOW (ref 43.0–77.0)
PLATELETS: 220 10*3/uL (ref 150.0–400.0)
RBC: 3.88 Mil/uL (ref 3.87–5.11)
RDW: 14.3 % (ref 11.5–15.5)
WBC: 4.6 10*3/uL (ref 4.0–10.5)

## 2014-12-20 LAB — URINALYSIS, ROUTINE W REFLEX MICROSCOPIC
BILIRUBIN URINE: NEGATIVE
Ketones, ur: NEGATIVE
Leukocytes, UA: NEGATIVE
Nitrite: NEGATIVE
Specific Gravity, Urine: >=1.03 — AB (ref 1.000–1.030)
URINE GLUCOSE: NEGATIVE
UROBILINOGEN UA: 0.2 (ref 0.0–1.0)
pH: 5.5 (ref 5.0–8.0)

## 2014-12-20 LAB — PROTIME-INR
INR: 1 ratio (ref 0.8–1.0)
Prothrombin Time: 11.3 s (ref 9.6–13.1)

## 2014-12-20 LAB — HEPATIC FUNCTION PANEL
ALT: 18 U/L (ref 0–35)
AST: 22 U/L (ref 0–37)
Albumin: 3.6 g/dL (ref 3.5–5.2)
Alkaline Phosphatase: 86 U/L (ref 39–117)
BILIRUBIN DIRECT: 0.1 mg/dL (ref 0.0–0.3)
BILIRUBIN TOTAL: 0.4 mg/dL (ref 0.2–1.2)
Total Protein: 7 g/dL (ref 6.0–8.3)

## 2014-12-20 NOTE — Telephone Encounter (Signed)
Opened in error

## 2014-12-20 NOTE — Progress Notes (Addendum)
Subjective:    Patient ID: Hailey Vance, female    DOB: [birth year], 49 y.o.   MRN: [id number]  HPI  Hailey Vance is a 49 yr old female who presents today for pre-operative evaluation.  Dr. Drucie Ip. She is scheduled for D and C with hysteroscopy on 01/11/15 with Dr. Jerelyn Charles.  Pt reports questionable pathology results from her recent ? Endometrial biopsy?Marland Kitchen  Records are currently unavailable.   OSA- used CPAP 3 yrs ago, stopped using due to the moving and some dental issue.  Reports her last sleep study was in 2010. Sleep study was done in [place] on [address].     Review of Systems  Constitutional: Negative for unexpected weight change.  HENT: Negative for hearing loss and rhinorrhea.   Eyes: Negative for visual disturbance.  Respiratory: Negative for cough and shortness of breath.   Cardiovascular: Negative for chest pain.  Gastrointestinal: Negative for diarrhea and constipation.  Genitourinary: Negative for dysuria and frequency.  Musculoskeletal: Negative for myalgias and arthralgias.  Skin: Negative for rash.  Neurological:       Reports HA's often in the AM  Hematological: Negative for adenopathy.  Psychiatric/Behavioral:       Denies depression/anxiety   Past Medical History  Diagnosis Date  . Hypertension   . Sleep apnea     CPAP  . Blood transfusion 1990    In [place], [place] with C/S Surgery - 5 units transfused  . Headache(784.0)     otc med prn  . Plantar fasciitis of left foot   . Arthritis     osteoarthritis - knees  . Chronic back pain   . Blood transfusion without reported diagnosis 1990    after a c/section  . Anxiety   . Depression   . Endometriosis   . Fibroid   . Vitamin D deficiency 09/06/2014  . Chest pain     Social History   Social History  . Marital Status: Divorced    Spouse Name: N/A  . Number of Children: N/A  . Years of Education: N/A   Occupational History  . Not on file.   Social History Main Topics  . Smoking status: Never  Smoker   . Smokeless tobacco: Never Used  . Alcohol Use: No  . Drug Use: No  . Sexual Activity:    Partners: Male    Birth Control/ Protection: Surgical     Comment: Tubal   Other Topics Concern  . Not on file   Social History Narrative   Divorced   Lives with son and grand-daughter   Hailey Vance (lives in [place])   [place] lives with patient   8- Daughter [name] (lives in [place])   Works at [employer]   Completed some college   Careers adviser       Past Surgical History  Procedure Laterality Date  . Cesarean section  1990    x 1  . Hernia repair    . Bilateral oophorectomy      Complete removal of left ovary and partial removal of right ovary.  . Knee surgery      right knee  . Svd      x 2  . Tubal ligation  1990    Family History  Problem Relation Age of Onset  . Lung cancer Mother 39    dec  . Cancer Mother   . Hypertension Mother   . Arthritis Mother   . Cancer  Father 67    prostate  . Hypertension Father   . Leukemia Sister   . Hypertension Sister   . Arthritis Sister   . Hyperlipidemia Sister   . Hypertension Brother   . Arthritis Brother   . Breast cancer Paternal Aunt   . Diabetes Maternal Grandmother   . Stroke Maternal Grandmother   . Heart attack Paternal Grandmother   . Multiple sclerosis Sister   . Hypertension Sister   . Hypertension Sister   . Hypertension Brother   . Hypertension Brother     Allergies  Allergen Reactions  . Penicillins Itching    Current Outpatient Prescriptions on File Prior to Visit  Medication Sig Dispense Refill  . albuterol (PROVENTIL HFA;VENTOLIN HFA) 108 (90 BASE) MCG/ACT inhaler Inhale 1-2 puffs into the lungs every 6 (six) hours as needed for wheezing or shortness of breath. 1 Inhaler 0  . aspirin-acetaminophen-caffeine (EXCEDRIN MIGRAINE) [phone] MG per tablet Take 2 tablets by mouth every 6 (six) hours as needed for headache or migraine.    Marland Kitchen  ibuprofen (ADVIL,MOTRIN) 200 MG tablet Take 800-1,000 mg by mouth every 8 (eight) hours as needed for moderate pain.    Marland Kitchen lisinopril-hydrochlorothiazide (PRINZIDE,ZESTORETIC) 20-25 MG per tablet Take 1 tablet by mouth daily. 30 tablet 6  . meloxicam (MOBIC) 7.5 MG tablet Take 1 tablet (7.5 mg total) by mouth daily. 30 tablet 2  . nystatin (MYCOSTATIN/NYSTOP) 100000 UNIT/GM POWD Apply twice daily to affected area as needed 30 g 1  . venlafaxine XR (EFFEXOR-XR) 75 MG 24 hr capsule Take 1 capsule (75 mg total) by mouth daily with breakfast. 60 capsule 1  . Vitamin D, Ergocalciferol, (DRISDOL) 50000 UNITS CAPS capsule Take 2 capsules by mouth weekly for 12 weeks. 24 capsule 0  . [DISCONTINUED] LISINOPRIL PO Take by mouth.     No current facility-administered medications on file prior to visit.    BP 132/90 mmHg  Pulse 66  Temp(Src) 98.5 F (36.9 C) (Oral)  Ht 5\' 4"  (1.626 m)  Wt 302 lb 12.8 oz (137.349 kg)  BMI 51.95 kg/m2  SpO2 97%  LMP 10/02/2007       Objective:   Physical Exam  Physical Exam  Constitutional: She is oriented to person, place, and time. She appears well-developed and well-nourished. No distress.  HENT:  Head: Normocephalic and atraumatic.  Right Ear: Tympanic membrane and ear canal normal.  Left Ear: Tympanic membrane and ear canal normal.  Mouth/Throat: Oropharynx is clear and moist.  Eyes: Pupils are equal, round, and reactive to light. No scleral icterus.  Neck: Normal range of motion. No thyromegaly present.  Cardiovascular: Normal rate and regular rhythm.   No murmur heard. Pulmonary/Chest: Effort normal and breath sounds normal. No respiratory distress. He has no wheezes. She has no rales. She exhibits no tenderness.  Abdominal: Soft. Bowel sounds are normal. He exhibits no distension and no mass. There is no tenderness. There is no rebound and no guarding.  Musculoskeletal: She exhibits no edema.  Lymphadenopathy:    She has no cervical adenopathy.    Neurological: She is alert and oriented to person, place, and time. Difficulty obtaining patellar reflexes due to overlying adipose tissue She exhibits normal muscle tone. Coordination normal.  Skin: Skin is warm and dry.  Psychiatric: She has a normal mood and affect. Her behavior is normal. Judgment and thought content normal.          Assessment & Plan:  Pre-operative evaluation- will obtain routine lab work, EKG and  CXR today. Advised pt that she will need to resume CPAP. Will request her old sleep study 2010 and will plan to repeat a split night study to obtain optimal settings. She will need close post-operative monitoring due to hx of OSA.  HA's likely due to untreated OSA.        Assessment & Plan:  EKG tracing is personally reviewed.  EKG notes NSR.  No acute changes.

## 2014-12-20 NOTE — Patient Instructions (Addendum)
Please complete lab work prior to leaving. Complete chest x ray on the first floor.  You will be contacted about your referral for sleep study.

## 2014-12-20 NOTE — Telephone Encounter (Signed)
-----   Message from Debbrah Alar, NP sent at 12/20/2014  9:23 AM EDT ----- Pt had sleep study 2010 [address] in [place], ? Sleep center. Could you please request report

## 2014-12-20 NOTE — Progress Notes (Signed)
Pre visit review using our clinic review tool, if applicable. No additional management support is needed unless otherwise documented below in the visit note. 

## 2014-12-20 NOTE — Telephone Encounter (Signed)
Noted  

## 2014-12-20 NOTE — Telephone Encounter (Signed)
[facility] has no record of ever doing sleep study for pt. Left message with [facility] [phone]) to verify if pt had study there.

## 2014-12-20 NOTE — Telephone Encounter (Signed)
[name], please advise:  Per Lauren at [facility], no record of pt having sleep study with their facility.  Also spoke with [facility] and they have no record pt having sleep study with them.

## 2014-12-25 ENCOUNTER — Telehealth: Payer: Self-pay | Admitting: Family

## 2014-12-25 NOTE — Telephone Encounter (Signed)
Caller name: Mickel Baas from Dr. Carlis Abbott / [facility] Call back number: [phone] or fax [phone]    Reason for call:  Dr. Carlis Abbott from [facility] requesting office notes clearing patient for surgery.

## 2014-12-25 NOTE — Telephone Encounter (Signed)
Spoke with Caryl Pina and she states papers have not been received but I will fax them to [phone].

## 2014-12-26 NOTE — Telephone Encounter (Signed)
Received confirmation fax on 12/25/14.

## 2015-01-01 ENCOUNTER — Other Ambulatory Visit: Payer: Self-pay | Admitting: Family

## 2015-01-01 NOTE — Telephone Encounter (Signed)
Last refill 12/01/14.  Please advise:  Name from pharmacy:  In chart as:  MELOXICAM 7.5MG  TAB meloxicam (MOBIC) 7.5 MG tablet     Sig: TAKE ONE TABLET BY MOUTH ONCE DAILY    Dispense: 30 tablet   Refills: 0   Start: 01/01/2015   Class: Normal    Requested on: 09/05/2014    Originally ordered on: 09/05/2014 12/01/2014

## 2015-01-10 ENCOUNTER — Encounter (HOSPITAL_COMMUNITY): Payer: Self-pay | Admitting: Anesthesiology

## 2015-01-10 ENCOUNTER — Other Ambulatory Visit: Payer: BLUE CROSS/BLUE SHIELD

## 2015-01-10 NOTE — Anesthesia Preprocedure Evaluation (Addendum)
Anesthesia Evaluation  Patient identified by MRN, date of birth, ID band Patient awake    Reviewed: Allergy & Precautions, NPO status , Patient's Chart, lab work & pertinent test results  Airway Mallampati: III  TM Distance: >3 FB Neck ROM: Full    Dental no notable dental hx. (+) Teeth Intact   Pulmonary sleep apnea and Continuous Positive Airway Pressure Ventilation ,    Pulmonary exam normal breath sounds clear to auscultation       Cardiovascular hypertension, Pt. on medications Normal cardiovascular exam Rhythm:Regular Rate:Normal     Neuro/Psych  Headaches, PSYCHIATRIC DISORDERS Anxiety Depression    GI/Hepatic negative GI ROS, Neg liver ROS,   Endo/Other  negative endocrine ROS  Renal/GU negative Renal ROS  negative genitourinary   Musculoskeletal  (+) Arthritis ,   Abdominal (+) + obese,   Peds  Hematology negative hematology ROS (+)   Anesthesia Other Findings   Reproductive/Obstetrics PMB                            Anesthesia Physical Anesthesia Plan  ASA: III  Anesthesia Plan: General   Post-op Pain Management:    Induction: Intravenous  Airway Management Planned: LMA  Additional Equipment:   Intra-op Plan:   Post-operative Plan: Extubation in OR  Informed Consent: I have reviewed the patients History and Physical, chart, labs and discussed the procedure including the risks, benefits and alternatives for the proposed anesthesia with the patient or authorized representative who has indicated his/her understanding and acceptance.   Dental advisory given  Plan Discussed with: CRNA, Anesthesiologist and Surgeon  Anesthesia Plan Comments:        Anesthesia Quick Evaluation

## 2015-01-11 ENCOUNTER — Ambulatory Visit (HOSPITAL_COMMUNITY): Payer: BLUE CROSS/BLUE SHIELD | Admitting: Anesthesiology

## 2015-01-11 ENCOUNTER — Encounter (HOSPITAL_COMMUNITY): Payer: Self-pay | Admitting: Obstetrics

## 2015-01-11 ENCOUNTER — Ambulatory Visit (HOSPITAL_COMMUNITY)
Admission: RE | Admit: 2015-01-11 | Discharge: 2015-01-11 | Disposition: A | Discharge status: Home / Self Care | Payer: BLUE CROSS/BLUE SHIELD | Source: Ambulatory Visit | Attending: Obstetrics | Admitting: Obstetrics

## 2015-01-11 ENCOUNTER — Encounter (HOSPITAL_COMMUNITY)
Admission: RE | Disposition: A | Discharge status: Home / Self Care | Payer: Self-pay | Source: Ambulatory Visit | Attending: Obstetrics

## 2015-01-11 DIAGNOSIS — Z880 Allergy status to penicillin: Secondary | ICD-10-CM | POA: Diagnosis not present

## 2015-01-11 DIAGNOSIS — D259 Leiomyoma of uterus, unspecified: Secondary | ICD-10-CM | POA: Insufficient documentation

## 2015-01-11 DIAGNOSIS — N858 Other specified noninflammatory disorders of uterus: Secondary | ICD-10-CM | POA: Insufficient documentation

## 2015-01-11 DIAGNOSIS — F329 Major depressive disorder, single episode, unspecified: Secondary | ICD-10-CM | POA: Insufficient documentation

## 2015-01-11 DIAGNOSIS — M170 Bilateral primary osteoarthritis of knee: Secondary | ICD-10-CM | POA: Diagnosis not present

## 2015-01-11 DIAGNOSIS — N950 Postmenopausal bleeding: Secondary | ICD-10-CM

## 2015-01-11 DIAGNOSIS — G8929 Other chronic pain: Secondary | ICD-10-CM | POA: Diagnosis not present

## 2015-01-11 DIAGNOSIS — M549 Dorsalgia, unspecified: Secondary | ICD-10-CM | POA: Insufficient documentation

## 2015-01-11 DIAGNOSIS — G4730 Sleep apnea, unspecified: Secondary | ICD-10-CM | POA: Insufficient documentation

## 2015-01-11 DIAGNOSIS — I10 Essential (primary) hypertension: Secondary | ICD-10-CM | POA: Insufficient documentation

## 2015-01-11 DIAGNOSIS — Z6841 Body Mass Index (BMI) 40.0 and over, adult: Secondary | ICD-10-CM | POA: Insufficient documentation

## 2015-01-11 DIAGNOSIS — F419 Anxiety disorder, unspecified: Secondary | ICD-10-CM | POA: Insufficient documentation

## 2015-01-11 DIAGNOSIS — E559 Vitamin D deficiency, unspecified: Secondary | ICD-10-CM | POA: Diagnosis not present

## 2015-01-11 HISTORY — PX: HYSTEROSCOPY WITH D & C: SHX1775

## 2015-01-11 SURGERY — DILATATION AND CURETTAGE /HYSTEROSCOPY
Anesthesia: General | Site: Vagina

## 2015-01-11 MED ORDER — LIDOCAINE HCL (CARDIAC) 20 MG/ML IV SOLN
INTRAVENOUS | Status: DC | PRN
  Administered 2015-01-11: 100 mg via INTRAVENOUS

## 2015-01-11 MED ORDER — PROPOFOL 10 MG/ML IV BOLUS
INTRAVENOUS | Status: AC
  Filled 2015-01-11: qty 20

## 2015-01-11 MED ORDER — LACTATED RINGERS IV SOLN
INTRAVENOUS | Status: DC
  Administered 2015-01-11 (×2): via INTRAVENOUS

## 2015-01-11 MED ORDER — MIDAZOLAM HCL 2 MG/2ML IJ SOLN
INTRAMUSCULAR | Status: DC | PRN
  Administered 2015-01-11: 1 mg via INTRAVENOUS

## 2015-01-11 MED ORDER — DEXAMETHASONE SODIUM PHOSPHATE 4 MG/ML IJ SOLN
INTRAMUSCULAR | Status: AC
  Filled 2015-01-11: qty 1

## 2015-01-11 MED ORDER — GLYCINE 1.5 % IR SOLN
Status: DC | PRN
  Administered 2015-01-11: 3000 mL

## 2015-01-11 MED ORDER — LIDOCAINE HCL 1 % IJ SOLN
INTRAMUSCULAR | Status: DC | PRN
  Administered 2015-01-11: 7 mL

## 2015-01-11 MED ORDER — PROPOFOL 10 MG/ML IV BOLUS
INTRAVENOUS | Status: DC | PRN
  Administered 2015-01-11: 200 mg via INTRAVENOUS

## 2015-01-11 MED ORDER — SCOPOLAMINE 1 MG/3DAYS TD PT72
1.0000 | MEDICATED_PATCH | Freq: Once | TRANSDERMAL | Status: DC
  Administered 2015-01-11: 1.5 mg via TRANSDERMAL

## 2015-01-11 MED ORDER — KETOROLAC TROMETHAMINE 30 MG/ML IJ SOLN
INTRAMUSCULAR | Status: AC
  Filled 2015-01-11: qty 1

## 2015-01-11 MED ORDER — EPHEDRINE SULFATE 50 MG/ML IJ SOLN
INTRAMUSCULAR | Status: DC | PRN
  Administered 2015-01-11 (×3): 10 mg via INTRAVENOUS

## 2015-01-11 MED ORDER — LIDOCAINE HCL 1 % IJ SOLN
INTRAMUSCULAR | Status: AC
  Filled 2015-01-11: qty 20

## 2015-01-11 MED ORDER — PHENYLEPHRINE HCL 10 MG/ML IJ SOLN
INTRAMUSCULAR | Status: DC | PRN
  Administered 2015-01-11: 80 ug via INTRAVENOUS
  Administered 2015-01-11: 40 ug via INTRAVENOUS
  Administered 2015-01-11: 80 ug via INTRAVENOUS

## 2015-01-11 MED ORDER — SCOPOLAMINE 1 MG/3DAYS TD PT72
MEDICATED_PATCH | TRANSDERMAL | Status: AC
  Administered 2015-01-11: 1.5 mg via TRANSDERMAL
  Filled 2015-01-11: qty 1

## 2015-01-11 MED ORDER — MIDAZOLAM HCL 2 MG/2ML IJ SOLN
INTRAMUSCULAR | Status: AC
  Filled 2015-01-11: qty 2

## 2015-01-11 MED ORDER — ONDANSETRON HCL 4 MG/2ML IJ SOLN
INTRAMUSCULAR | Status: AC
  Filled 2015-01-11: qty 2

## 2015-01-11 MED ORDER — IBUPROFEN 600 MG PO TABS: 600.0000 mg | ORAL_TABLET | Freq: Four times a day (QID) | ORAL | Status: DC | PRN

## 2015-01-11 MED ORDER — LIDOCAINE HCL (CARDIAC) 20 MG/ML IV SOLN
INTRAVENOUS | Status: AC
  Filled 2015-01-11: qty 5

## 2015-01-11 MED ORDER — EPHEDRINE 5 MG/ML INJ
INTRAVENOUS | Status: AC
  Filled 2015-01-11: qty 10

## 2015-01-11 MED ORDER — HYDROCODONE-ACETAMINOPHEN 5-325 MG PO TABS: 1.0000 | ORAL_TABLET | Freq: Four times a day (QID) | ORAL | Status: DC | PRN

## 2015-01-11 MED ORDER — FENTANYL CITRATE (PF) 100 MCG/2ML IJ SOLN
INTRAMUSCULAR | Status: DC | PRN
  Administered 2015-01-11: 75 ug via INTRAVENOUS
  Administered 2015-01-11: 25 ug via INTRAVENOUS

## 2015-01-11 MED ORDER — FENTANYL CITRATE (PF) 100 MCG/2ML IJ SOLN
INTRAMUSCULAR | Status: AC
  Filled 2015-01-11: qty 2

## 2015-01-11 MED ORDER — DEXAMETHASONE SODIUM PHOSPHATE 10 MG/ML IJ SOLN
INTRAMUSCULAR | Status: DC | PRN
  Administered 2015-01-11: 4 mg via INTRAVENOUS

## 2015-01-11 MED ORDER — ONDANSETRON HCL 4 MG/2ML IJ SOLN
INTRAMUSCULAR | Status: DC | PRN
  Administered 2015-01-11: 4 mg via INTRAVENOUS

## 2015-01-11 MED ORDER — KETOROLAC TROMETHAMINE 30 MG/ML IJ SOLN
INTRAMUSCULAR | Status: DC | PRN
  Administered 2015-01-11: 30 mg via INTRAVENOUS

## 2015-01-11 SURGICAL SUPPLY — 16 items
CATH ROBINSON RED A/P 16FR (CATHETERS) ×2 IMPLANT
CLOTH BEACON ORANGE TIMEOUT ST (SAFETY) ×2 IMPLANT
CONTAINER PREFILL 10% NBF 60ML (FORM) ×4 IMPLANT
GLOVE BIO SURGEON STRL SZ 6 (GLOVE) ×2 IMPLANT
GLOVE BIOGEL PI IND STRL 6.5 (GLOVE) ×1 IMPLANT
GLOVE BIOGEL PI IND STRL 7.0 (GLOVE) ×1 IMPLANT
GLOVE BIOGEL PI INDICATOR 6.5 (GLOVE) ×1
GLOVE BIOGEL PI INDICATOR 7.0 (GLOVE) ×1
GOWN STRL REUS W/TWL LRG LVL3 (GOWN DISPOSABLE) ×4 IMPLANT
LOOP ANGLED CUTTING 22FR (CUTTING LOOP) IMPLANT
PACK VAGINAL MINOR WOMEN LF (CUSTOM PROCEDURE TRAY) ×2 IMPLANT
PAD OB MATERNITY 4.3X12.25 (PERSONAL CARE ITEMS) ×2 IMPLANT
TOWEL OR 17X24 6PK STRL BLUE (TOWEL DISPOSABLE) ×4 IMPLANT
TUBING AQUILEX INFLOW (TUBING) ×2 IMPLANT
TUBING AQUILEX OUTFLOW (TUBING) ×2 IMPLANT
WATER STERILE IRR 1000ML POUR (IV SOLUTION) ×2 IMPLANT

## 2015-01-11 NOTE — Discharge Instructions (Addendum)
Pelvic rest x 2 weeks (no intercourse or tampons).  No tub baths or swimming for two weeks.    Call your doctor if you have heavy vaginal bleeding (soaking through a pad an hour or more for >2 hours in a row), temperature >101F, severe nausea, vomiting, severe or worsening abdominal pain, dizziness, shortness of breath, chest pain or any other concerns.  Please take motrin every 6 hours.  Do not take motrin with mobic (meloxicam).  Add norco if your pain is not controlled by motrin alone. Marland Kitchen   Hysteroscopy Hysteroscopy is a procedure used for looking inside the womb (uterus). It may be done for various reasons, including:  To evaluate abnormal bleeding, fibroid (benign, noncancerous) tumors, polyps, scar tissue (adhesions), and possibly cancer of the uterus.  To look for lumps (tumors) and other uterine growths.  To look for causes of why a woman cannot get pregnant (infertility), causes of recurrent loss of pregnancy (miscarriages), or a lost intrauterine device (IUD).  To perform a sterilization by blocking the fallopian tubes from inside the uterus. In this procedure, a thin, flexible tube with a tiny light and camera on the end of it (hysteroscope) is used to look inside the uterus. A hysteroscopy should be done right after a menstrual period to be sure you are not pregnant. LET [facility] CARE PROVIDER KNOW ABOUT:   Any allergies you have.  All medicines you are taking, including vitamins, herbs, eye drops, creams, and over-the-counter medicines.  Previous problems you or members of your family have had with the use of anesthetics.  Any blood disorders you have.  Previous surgeries you have had.  Medical conditions you have. RISKS AND COMPLICATIONS  Generally, this is a safe procedure. However, as with any procedure, complications can occur. Possible complications include:  Putting a hole in the uterus.  Excessive bleeding.  Infection.  Damage to the cervix.  Injury to  other organs.  Allergic reaction to medicines.  Too much fluid used in the uterus for the procedure. BEFORE THE PROCEDURE   Ask your health care provider about changing or stopping any regular medicines.  Do not take aspirin or blood thinners for 1 week before the procedure, or as directed by your health care provider. These can cause bleeding.  If you smoke, do not smoke for 2 weeks before the procedure.  In some cases, a medicine is placed in the cervix the day before the procedure. This medicine makes the cervix have a larger opening (dilate). This makes it easier for the instrument to be inserted into the uterus during the procedure.  Do not eat or drink anything for at least 8 hours before the surgery.  Arrange for someone to take you home after the procedure. PROCEDURE   You may be given a medicine to relax you (sedative). You may also be given one of the following:  A medicine that numbs the area around the cervix (local anesthetic).  A medicine that makes you sleep through the procedure (general anesthetic).  The hysteroscope is inserted through the vagina into the uterus. The camera on the hysteroscope sends a picture to a TV screen. This gives the surgeon a good view inside the uterus.  During the procedure, air or a liquid is put into the uterus, which allows the surgeon to see better.  Sometimes, tissue is gently scraped from inside the uterus. These tissue samples are sent to a lab for testing. AFTER THE PROCEDURE   If you had a general  anesthetic, you may be groggy for a couple hours after the procedure.  If you had a local anesthetic, you will be able to go home as soon as you are stable and feel ready.  You may have some cramping. This normally lasts for a couple days.  You may have bleeding, which varies from light spotting for a few days to menstrual-like bleeding for 3-7 days. This is normal.  If your test results are not back during the visit, make an  appointment with your health care provider to find out the results.   This information is not intended to replace advice given to you by your health care provider. Make sure you discuss any questions you have with your health care provider.   Document Released: 05/26/2000 Document Revised: 12/08/2012 Document Reviewed: 09/16/2012 Elsevier Interactive Patient Education 2016 [facility] Instructions  Activity: Get plenty of rest for the remainder of the day. A responsible adult should stay with you for 24 hours following the procedure.  For the next 24 hours, DO NOT: -Drive a car -Paediatric nurse -Drink alcoholic beverages -Take any medication unless instructed by your physician -Make any legal decisions or sign important papers.  Meals: Start with liquid foods such as gelatin or soup. Progress to regular foods as tolerated. Avoid greasy, spicy, heavy foods. If nausea and/or vomiting occur, drink only clear liquids until the nausea and/or vomiting subsides. Call your physician if vomiting continues.  Special Instructions/Symptoms: Your throat may feel dry or sore from the anesthesia or the breathing tube placed in your throat during surgery. If this causes discomfort, gargle with warm salt water. The discomfort should disappear within 24 hours.  If you had a scopolamine patch placed behind your ear for the management of post- operative nausea and/or vomiting:  1. The medication in the patch is effective for 72 hours, after which it should be removed.  Wrap patch in a tissue and discard in the trash. Wash hands thoroughly with soap and water. 2. You may remove the patch earlier than 72 hours if you experience unpleasant side effects which may include dry mouth, dizziness or visual disturbances. 3. Avoid touching the patch. Wash your hands with soap and water after contact with the patch.

## 2015-01-11 NOTE — Anesthesia Procedure Notes (Signed)
Procedure Name: LMA Insertion Date/Time: 01/11/2015 12:13 PM Performed by: Casimer Lanius A Pre-anesthesia Checklist: Patient being monitored, Patient identified, Emergency Drugs available and Suction available Patient Re-evaluated:Patient Re-evaluated prior to inductionOxygen Delivery Method: Circle system utilized Preoxygenation: Pre-oxygenation with 100% oxygen Intubation Type: IV induction and Inhalational induction Ventilation: Mask ventilation without difficulty LMA: LMA inserted LMA Size: 4.0 Number of attempts: 1 Dental Injury: Teeth and Oropharynx as per pre-operative assessment

## 2015-01-11 NOTE — Brief Op Note (Signed)
01/11/2015  12:40 PM  PATIENT:  Hailey Vance  49 y.o. female  PRE-OPERATIVE DIAGNOSIS:  Postmenopausal BLEEDING  POST-OPERATIVE DIAGNOSIS:  Postmenopausal BLEEDING  PROCEDURE:  Procedure(s): DILATATION AND CURETTAGE /HYSTEROSCOPY (N/A)  SURGEON:  Surgeon(s) and Role:    * Jerelyn Charles, MD - Primary  ANESTHESIA:   general  EBL:  Total I/O In: -  Out: 30 [Urine:30]  BLOOD ADMINISTERED:none  DRAINS: none   LOCAL MEDICATIONS USED:  LIDOCAINE   SPECIMEN:  Source of Specimen:  endometrial curettings  DISPOSITION OF SPECIMEN:  PATHOLOGY  COUNTS:  YES  TOURNIQUET:  * No tourniquets in log *  DICTATION: .Note written in EPIC  PLAN OF CARE: Discharge to home after PACU  PATIENT DISPOSITION:  PACU - hemodynamically stable.   Delay start of Pharmacological VTE agent (>24hrs) due to surgical blood loss or risk of bleeding: not applicable

## 2015-01-11 NOTE — Anesthesia Postprocedure Evaluation (Signed)
  Anesthesia Post-op Note  Patient: Hailey Vance  Procedure(s) Performed: Procedure(s) (LRB): DILATATION AND CURETTAGE /HYSTEROSCOPY (N/A)  Patient Location: PACU  Anesthesia Type: General  Level of Consciousness: awake and alert   Airway and Oxygen Therapy: Patient Spontanous Breathing  Post-op Pain: mild  Post-op Assessment: Post-op Vital signs reviewed, Patient's Cardiovascular Status Stable, Respiratory Function Stable, Patent Airway and No signs of Nausea or vomiting  Last Vitals:  Filed Vitals:   01/11/15 1300  BP: 119/72  Pulse: 83  Temp:   Resp: 18    Post-op Vital Signs: stable   Complications: No apparent anesthesia complications

## 2015-01-11 NOTE — H&P (Signed)
49 y.o. [id number] morbidly obese female presents for hysteroscopy, dilation and curettage for post menopausal bleeding.  She presented with a 2 year history of intermittent PMB.  She reported multiple episodes of bleeding, sometimes heavy.  She had a history of uterine fibroids.  She had an endometrial biopsy performed in the office that showed benign endometrium; however, minimal tissue was obtained.  [place] with 6 cm submucosal fibroid.  Given morbid obesity and high risk of endometrial hyperplasia or cancer, she agrees to better sampling in the operating room.   Past Medical History  Diagnosis Date  . Hypertension   . Sleep apnea     CPAP  . Blood transfusion 1990    In [place], [place] with C/S Surgery - 5 units transfused  . Headache(784.0)     otc med prn  . Plantar fasciitis of left foot   . Arthritis     osteoarthritis - knees  . Chronic back pain   . Blood transfusion without reported diagnosis 1990    after a c/section  . Anxiety   . Depression   . Fibroid   . Vitamin D deficiency 09/06/2014  . Chest pain     Past Surgical History  Procedure Laterality Date  . Cesarean section  1990    x 1  . Hernia repair    . Bilateral oophorectomy      Complete removal of left ovary and partial removal of right ovary.  . Knee surgery      right knee  . Tubal ligation  1990    OB History  Gravida Para Term Preterm AB SAB TAB Ectopic Multiple Living  3 3 3       3     # Outcome Date GA Lbr Len/2nd Weight Sex Delivery Anes PTL Lv  3 Term           2 Term           1 Term               Social History   Social History  . Marital Status: Divorced    Spouse Name: N/A  . Number of Children: N/A  . Years of Education: N/A   Occupational History  . Not on file.   Social History Main Topics  . Smoking status: Never Smoker   . Smokeless tobacco: Never Used  . Alcohol Use: No  . Drug Use: No  . Sexual Activity:    Partners: Male    Birth Control/ Protection: Surgical     Comment:  Tubal   Other Topics Concern  . 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  [id number]  Morbidly obese female with persistent post menopausal bleeding.   EMB in office with poor sampling of the cavity.  We discussed more complete sampling in to the operating room with hysteroscopy and dilation and curettage.   Patient has seen her PCP and is felt  to be medically optimized for this procedure.   We discussed the risks to include infection, bleeding, damage to surrounding structures (including but not limited to vagina, cervix, bladder, uterus), uterine perforation, need for additional procedures.  All questions answered and patient elects to proceed.    [place]

## 2015-01-11 NOTE — Transfer of Care (Signed)
Immediate Anesthesia Transfer of Care Note  Patient: Hailey Vance  Procedure(s) Performed: Procedure(s): DILATATION AND CURETTAGE /HYSTEROSCOPY (N/A)  Patient Location: PACU  Anesthesia Type:General  Level of Consciousness: sedated  Airway & Oxygen Therapy: Patient Spontanous Breathing and Patient connected to nasal cannula oxygen  Post-op Assessment: Report given to RN  Post vital signs: Reviewed and stable  Last Vitals:  Filed Vitals:   01/11/15 1042  BP: 139/88  Pulse: 68  Temp: 36.7 C  Resp: 16    Complications: No apparent anesthesia complications

## 2015-01-12 ENCOUNTER — Encounter (HOSPITAL_COMMUNITY): Payer: Self-pay | Admitting: Obstetrics

## 2015-01-12 NOTE — Op Note (Signed)
Operative Note  Pre-operative Diagnosis: postmenopausal bleeding  Post-operative Diagnosis: postmenopausal bleeding  Surgeon: Jerelyn Charles, MD  Assistants: n/a  Procedure: hysteroscopy, dilation and curettage  Anesthesia: general  Estimated Blood Loss: Minimal         Drains: none         Specimens: endometrial curettings to pathology         Findings: Uterus sounded to 10 cm.  The anterior and left aspect of the endometrial cavity were distorted by a large fibroid.  The endometrium was otherwise thin and atrophic   Description of Procedure:         After adequate anesthesia was achieved, the patient placed in the dorsal lithotomy position in [place].  She was prepped and draped in the usual sterile fashion.  The bladder was drained with a catheter.  A bimanual exam revealed an anteverted uterus.  The speculum was placed in the vagina and the anterior lip of the cervix grasped with a single-tooth tenaculum. 10 cc of 1% lidocaine were injected for a paracervical block.  The cervix was serially dilated with Hanks dilators to 5mm.  Under direct visualization, the hysteroscope was advanced.  The uterine cavity was surveyed. The anterior and left aspect of the endometrial cavity were distorted by a large fibroid.  The left tubal ostia was unable to be identified.  The endometrium was otherwise thin and atrophic.  The hysteroscope was removed.  A gentle sharp curettage was performed in all four quadrants.  The specimen was sent to pathology.  All the vaginal instruments were removed.  Counts were correct.  The patient was awakened from anesthesia and transferred to PACU in stable condition.    [place], [facility]

## 2015-01-24 ENCOUNTER — Encounter (HOSPITAL_BASED_OUTPATIENT_CLINIC_OR_DEPARTMENT_OTHER): Payer: BLUE CROSS/BLUE SHIELD

## 2015-02-14 ENCOUNTER — Other Ambulatory Visit: Payer: Self-pay

## 2015-02-14 ENCOUNTER — Other Ambulatory Visit: Payer: Self-pay | Admitting: Family

## 2015-02-14 NOTE — Telephone Encounter (Signed)
Please advise below request:   Name from pharmacy:  In chart as:  MELOXICAM 7.5MG  TAB meloxicam (MOBIC) 7.5 MG tablet    The source prescription has been discontinued.    Sig: TAKE ONE TABLET BY MOUTH ONCE DAILY    Dispense: 30 tablet   Refills: 0   Start: 02/14/2015   Class: Normal    Requested on: 01/02/2015    Originally ordered on: 09/05/2014 01/02/2015

## 2015-03-07 ENCOUNTER — Other Ambulatory Visit: Payer: Self-pay | Admitting: Family

## 2015-03-07 NOTE — Telephone Encounter (Signed)
Left detailed message on pt's cell# that she must repeat her vitamin D level before we can send refills to pharmacy. Future lab order is still in system. Will await Vitamin D result before sending refill.

## 2015-03-09 NOTE — Telephone Encounter (Signed)
My chart message sent to pt.

## 2015-04-09 ENCOUNTER — Ambulatory Visit (HOSPITAL_BASED_OUTPATIENT_CLINIC_OR_DEPARTMENT_OTHER): Payer: BLUE CROSS/BLUE SHIELD | Attending: Family | Admitting: Radiology

## 2015-04-09 DIAGNOSIS — R51 Headache: Secondary | ICD-10-CM | POA: Diagnosis not present

## 2015-04-09 DIAGNOSIS — R5383 Other fatigue: Secondary | ICD-10-CM | POA: Diagnosis not present

## 2015-04-09 DIAGNOSIS — G4733 Obstructive sleep apnea (adult) (pediatric): Secondary | ICD-10-CM | POA: Diagnosis not present

## 2015-04-09 DIAGNOSIS — Z6841 Body Mass Index (BMI) 40.0 and over, adult: Secondary | ICD-10-CM | POA: Diagnosis not present

## 2015-04-09 DIAGNOSIS — E669 Obesity, unspecified: Secondary | ICD-10-CM | POA: Insufficient documentation

## 2015-04-09 DIAGNOSIS — I10 Essential (primary) hypertension: Secondary | ICD-10-CM | POA: Diagnosis not present

## 2015-04-09 DIAGNOSIS — R0683 Snoring: Secondary | ICD-10-CM | POA: Diagnosis not present

## 2015-04-09 DIAGNOSIS — G4719 Other hypersomnia: Secondary | ICD-10-CM | POA: Insufficient documentation

## 2015-04-11 ENCOUNTER — Ambulatory Visit: Payer: BLUE CROSS/BLUE SHIELD | Admitting: Family

## 2015-04-17 ENCOUNTER — Ambulatory Visit: Payer: BLUE CROSS/BLUE SHIELD | Admitting: Family

## 2015-04-18 ENCOUNTER — Telehealth: Payer: Self-pay | Admitting: Family

## 2015-04-18 DIAGNOSIS — G4733 Obstructive sleep apnea (adult) (pediatric): Secondary | ICD-10-CM | POA: Diagnosis not present

## 2015-04-18 NOTE — Progress Notes (Signed)
Patient Name: Hailey Vance, Hailey Vance Study Date: 04/09/2015 Gender: Female D.O.B: 01/31/1966 Age (years): 49 Referring Provider: [name] OSullivan Height (inches): 64 Interpreting Physician: Rakesh Alva MD, ABSM Weight (lbs): 304 RPSGT: Spruill, Vicki BMI: 52 MRN: [id number] Neck Size: 14.00   CLINICAL INFORMATION Sleep Study Type: Split Night CPAP Indication for sleep study: Excessive Daytime Sleepiness, Fatigue, Hypertension, Morning Headaches, Obesity, OSA, Snoring Epworth Sleepiness Score: 14   SLEEP STUDY TECHNIQUE As per the AASM Manual for the Scoring of Sleep and Associated Events v2.3 (April 2016) with a hypopnea requiring 4% desaturations. The channels recorded and monitored were frontal, central and occipital EEG, electrooculogram (EOG), submentalis EMG (chin), nasal and oral airflow, thoracic and abdominal wall motion, anterior tibialis EMG, snore microphone, electrocardiogram, and pulse oximetry. Continuous positive airway pressure (CPAP) was initiated when the patient met split night criteria and was titrated according to treat sleep-disordered breathing.   MEDICATIONS Medications administered by patient during sleep study : No sleep medicine administered.   RESPIRATORY PARAMETERS Diagnostic Total AHI (/hr): 55.0 RDI (/hr): 57.0 OA Index (/hr): - CA Index (/hr): 0.0 REM AHI (/hr): N/A NREM AHI (/hr): 55.0 Supine AHI (/hr): 31.0 Non-supine AHI (/hr): 69.14 Min O2 Sat (%): 81.00 Mean O2 (%): 92.88 Time below 88% (min): 15.9   Titration Optimal Pressure (cm): 13 AHI at Optimal Pressure (/hr): 0.0 Min O2 at Optimal Pressure (%): 96.0 Supine % at Optimal (%): 0 Sleep % at Optimal (%): 98     SLEEP ARCHITECTURE The recording time for the entire night was 375.5 minutes. During a baseline period of 212.7 minutes, the patient slept for 125.4 minutes in REM and nonREM, yielding a sleep efficiency of 58.9%. Sleep onset after lights out was 34.3 minutes with a REM latency of N/A  minutes. The patient spent 9.57% of the night in stage N1 sleep, 90.43% in stage N2 sleep, 0.00% in stage N3 and 0.00% in REM. During the titration period of 162.0 minutes, the patient slept for 130.8 minutes in REM and nonREM, yielding a sleep efficiency of 80.8%. Sleep onset after CPAP initiation was 0.0 minutes with a REM latency of 74.3 minutes. The patient spent 17.58% of the night in stage N1 sleep, 67.13% in stage N2 sleep, 0.00% in stage N3 and 15.29% in REM.   CARDIAC DATA The 2 lead EKG demonstrated sinus rhythm. The mean heart rate was 69.65 beats per minute. Other EKG findings include: None.   LEG MOVEMENT DATA The total Periodic Limb Movements of Sleep (PLMS) were 51. The PLMS index was 11.91 .   IMPRESSIONS - Severe obstructive sleep apnea occurred during the diagnostic portion of the study (AHI = 55.0/hour). An optimal PAP pressure was selected for this patient ( 13 cm of water) - No significant central sleep apnea occurred during the diagnostic portion of the study (CAI = 0.0/hour). - Severe oxygen desaturation was noted during the diagnostic portion of the study (Min O2 = 81.00%). - The patient snored with Moderate snoring volume during the diagnostic portion of the study. - No cardiac abnormalities were noted during this study. - Mild periodic limb movements of sleep occurred during the study.   DIAGNOSIS - Obstructive Sleep Apnea (327.23 [G47.33 ICD-10])   RECOMMENDATIONS - Trial of CPAP therapy on 13 cm H2O with a Medium size Resmed Full Face Mask AirFit F20 mask and heated humidification. - Avoid alcohol, sedatives and other CNS depressants that may worsen sleep apnea and disrupt normal sleep architecture. - Sleep hygiene should be reviewed to assess factors   that may improve sleep quality. - Weight management and regular exercise should be initiated or continued. - Return to [facility] for re-evaluation after 4 weeks of therapy   Rakesh Alva MD. FCCP. [place]  [facility]    

## 2015-04-18 NOTE — Telephone Encounter (Signed)
Please advise pt that sleep study does show OSA.  I would recommend that she start CPAP- we will arrange.  Follow up 1 month after starting

## 2015-04-18 NOTE — Telephone Encounter (Signed)
Attempted to reach pt by phone and unable to leave message as mailbox is full. Sent [employer].

## 2015-05-02 ENCOUNTER — Encounter: Payer: Self-pay | Admitting: Family

## 2015-05-02 ENCOUNTER — Ambulatory Visit (INDEPENDENT_AMBULATORY_CARE_PROVIDER_SITE_OTHER): Payer: BLUE CROSS/BLUE SHIELD | Admitting: Family

## 2015-05-02 VITALS — BP 100/70 | HR 69 | Temp 98.5°F | Resp 18 | Ht 64.25 in | Wt 302.6 lb

## 2015-05-02 DIAGNOSIS — Z0000 Encounter for general adult medical examination without abnormal findings: Secondary | ICD-10-CM

## 2015-05-02 DIAGNOSIS — E559 Vitamin D deficiency, unspecified: Secondary | ICD-10-CM

## 2015-05-02 DIAGNOSIS — F419 Anxiety disorder, unspecified: Secondary | ICD-10-CM

## 2015-05-02 DIAGNOSIS — F32A Depression, unspecified: Secondary | ICD-10-CM

## 2015-05-02 DIAGNOSIS — I10 Essential (primary) hypertension: Secondary | ICD-10-CM

## 2015-05-02 DIAGNOSIS — Z1159 Encounter for screening for other viral diseases: Secondary | ICD-10-CM

## 2015-05-02 DIAGNOSIS — F418 Other specified anxiety disorders: Secondary | ICD-10-CM | POA: Diagnosis not present

## 2015-05-02 DIAGNOSIS — F329 Major depressive disorder, single episode, unspecified: Secondary | ICD-10-CM

## 2015-05-02 LAB — BASIC METABOLIC PANEL
BUN: 19 mg/dL (ref 6–23)
CALCIUM: 9.3 mg/dL (ref 8.4–10.5)
CHLORIDE: 101 meq/L (ref 96–112)
CO2: 32 meq/L (ref 19–32)
Creatinine, Ser: 1 mg/dL (ref 0.40–1.20)
GFR: 75.11 mL/min (ref >60.00)
GLUCOSE: 95 mg/dL (ref 70–99)
Potassium: 3.3 mEq/L — ABNORMAL LOW (ref 3.5–5.1)
SODIUM: 139 meq/L (ref 135–145)

## 2015-05-02 LAB — VITAMIN D 25 HYDROXY (VIT D DEFICIENCY, FRACTURES): VITD: 20.51 ng/mL — ABNORMAL LOW (ref 30.00–100.00)

## 2015-05-02 LAB — HEPATITIS C ANTIBODY: HCV Ab: NEGATIVE

## 2015-05-02 MED ORDER — MELOXICAM 7.5 MG PO TABS: 7.5000 mg | ORAL_TABLET | Freq: Every day | ORAL | Status: DC

## 2015-05-02 MED ORDER — LISINOPRIL-HYDROCHLOROTHIAZIDE 20-25 MG PO TABS: 0.5000 | ORAL_TABLET | Freq: Every day | ORAL | Status: DC

## 2015-05-02 NOTE — Progress Notes (Signed)
Pre visit review using our clinic review tool, if applicable. No additional management support is needed unless otherwise documented below in the visit note. 

## 2015-05-02 NOTE — Progress Notes (Addendum)
Subjective:    Patient ID: Hailey Vance, female    DOB: 08-02-1965, 50 y.o.   MRN: [id number]  HPI  Hailey Vance is a 50 yr old female who presents today for follow up.  1) HTN- on lisinopril-hctz.   Denies dizziness.  Does not check pressure at home.  BP Readings from Last 3 Encounters:  05/02/15 100/70  01/11/15 141/91  12/20/14 132/90   2) Anxiety/Depression- Pt's GYN stopped venlafaxine. Reports that she had spinning sensation after she ran out x 1 week. She was placed pt on prozac. She reports mood and anxiety is OK.   4) Vit D deficiency- not currently on vit D supplement.   Review of Systems  Respiratory: Negative for shortness of breath.   Cardiovascular: Negative for chest pain and leg swelling.      see HPI  Past Medical History  Diagnosis Date  . Hypertension   . Sleep apnea     CPAP  . Blood transfusion 1990    In [place], [place] with C/S Surgery - 5 units transfused  . Headache(784.0)     otc med prn  . Plantar fasciitis of left foot   . Arthritis     osteoarthritis - knees  . Chronic back pain   . Blood transfusion without reported diagnosis 1990    after a c/section  . Anxiety   . Depression   . Fibroid   . Vitamin D deficiency 09/06/2014  . Chest pain     Social History   Social History  . Marital Status: Divorced    Spouse Name: N/A  . Number of Children: N/A  . Years of Education: N/A   Occupational History  . Not on file.   Social History Main Topics  . Smoking status: Never Smoker   . Smokeless tobacco: Never Used  . Alcohol Use: No  . Drug Use: No  . Sexual Activity:    Partners: Male    Birth Control/ Protection: Surgical     Comment: Tubal   Other Topics Concern  . Not on file   Social History Narrative   Divorced   Lives with son and grand-daughter   [place] (lives in [place])   [place] lives with patient   16- Daughter [name] (lives in [place])   Works at [employer]   Completed  some college   Careers adviser       Past Surgical History  Procedure Laterality Date  . Cesarean section  1990    x 1  . Hernia repair    . Bilateral oophorectomy      Complete removal of left ovary and partial removal of right ovary.  . Knee surgery      right knee  . Tubal ligation  1990  . Hysteroscopy w/d&c N/A 01/11/2015    Procedure: DILATATION AND CURETTAGE /HYSTEROSCOPY;  Surgeon: Jerelyn Charles, MD;  Location: [facility];  Service: Gynecology;  Laterality: N/A;    Family History  Problem Relation Age of Onset  . Lung cancer Mother 63    dec  . Cancer Mother   . Hypertension Mother   . Arthritis Mother   . Cancer Father 10    prostate  . Hypertension Father   . Leukemia Sister   . Hypertension Sister   . Arthritis Sister   . Hyperlipidemia Sister   . Hypertension Brother   . Arthritis Brother   . Breast cancer Paternal Aunt   . Diabetes  Maternal Grandmother   . Stroke Maternal Grandmother   . Heart attack Paternal Grandmother   . Multiple sclerosis Sister   . Hypertension Sister   . Hypertension Sister   . Hypertension Brother   . Hypertension Brother     Allergies  Allergen Reactions  . Penicillins Itching  . Venlafaxine Other (See Comments)    "felt spacey"    Current Outpatient Prescriptions on File Prior to Visit  Medication Sig Dispense Refill  . albuterol (PROVENTIL HFA;VENTOLIN HFA) 108 (90 BASE) MCG/ACT inhaler Inhale 1-2 puffs into the lungs every 6 (six) hours as needed for wheezing or shortness of breath. 1 Inhaler 0  . aspirin-acetaminophen-caffeine (EXCEDRIN MIGRAINE) [id number] MG per tablet Take 2 tablets by mouth every 6 (six) hours as needed for headache or migraine.    Mar[place] Kitchen HYDROcodone-acetaminophen (NORCO) 5-325 MG tablet Take 1 tablet by mouth every 6 (six) hours as needed for severe pain. 10 tablet 0  . ibuprofen (ADVIL,MOTRIN) 600 MG tablet Take 1 tablet (600 mg total) by mouth every 6 (six) hours as needed for moderate pain or  cramping. 30 tablet 0  . lisinopril-hydrochlorothiazide (PRINZIDE,ZESTORETIC) 20-25 MG per tablet Take 1 tablet by mouth daily. 30 tablet 6  . meloxicam (MOBIC) 7.5 MG tablet TAKE ONE TABLET BY MOUTH ONCE DAILY 30 tablet 0  . nystatin (MYCOSTATIN/NYSTOP) 100000 UNIT/GM POWD Apply twice daily to affected area as needed 30 g 1  . Vitamin D, Ergocalciferol, (DRISDOL) 50000 UNITS CAPS capsule Take 2 capsules by mouth weekly for 12 weeks. 24 capsule 0  . [DISCONTINUED] LISINOPRIL PO Take by mouth.     No current facility-administered medications on file prior to visit.    BP 100/70 mmHg  Pulse 69  Temp(Src) 98.5 F (36.9 C) (Oral)  Resp 18  Ht 5' 4.25" (1.632 m)  Wt 302 lb 9.6 oz (137.258 kg)  BMI 51.53 kg/m2  SpO2 100%  LMP 10/02/2007    Objective:   Physical Exam  Constitutional: She is oriented to person, place, and time. She appears well-developed and well-nourished.  Eyes: No scleral icterus.  Cardiovascular: Normal rate, regular rhythm and normal heart sounds.   No murmur heard. Pulmonary/Chest: Effort normal and breath sounds normal. No respiratory distress. She has no wheezes.  Musculoskeletal: She exhibits no edema.  Neurological: She is alert and oriented to person, place, and time.  Psychiatric: She has a normal mood and affect. Her behavior is normal. Judgment and thought content normal.          Assessment & Plan:

## 2015-05-02 NOTE — Patient Instructions (Signed)
Please complete lab work prior to leaving. Cut lisinopril hctz in half and take 1/2 tab once daily.

## 2015-05-03 ENCOUNTER — Other Ambulatory Visit: Payer: Self-pay | Admitting: Family

## 2015-05-03 DIAGNOSIS — E876 Hypokalemia: Secondary | ICD-10-CM

## 2015-05-03 DIAGNOSIS — E559 Vitamin D deficiency, unspecified: Secondary | ICD-10-CM

## 2015-05-03 NOTE — Telephone Encounter (Signed)
Vitamin D level is low.  Advise patient to begin vit D 50000 units once weekly for 12 weeks, then repeat vit D level (dx Vit D deficiency).    K+ is low.  Add Kdur 45meq  once daily.  Repeat bmet in 1 week.

## 2015-05-04 MED ORDER — VITAMIN D (ERGOCALCIFEROL) 1.25 MG (50000 UNIT) PO CAPS: ORAL_CAPSULE | ORAL | Status: DC

## 2015-05-04 MED ORDER — POTASSIUM CHLORIDE CRYS ER 20 MEQ PO TBCR: 20.0000 meq | EXTENDED_RELEASE_TABLET | Freq: Every day | ORAL | Status: DC

## 2015-05-04 NOTE — Assessment & Plan Note (Signed)
Follow up vit D level is low.  Start weekly vit D 50000 x 12 weeks, see phone note.

## 2015-05-04 NOTE — Assessment & Plan Note (Signed)
Appears overtreated. Decrease lisinopril-hctz, cut in half.  Follow up for nurse visit BP recheck.

## 2015-05-04 NOTE — Telephone Encounter (Signed)
Detailed message left on pt's cell#, Rxs sent to [facility] and advised pt to call on Monday to schedule below lab appts. Future orders entered.

## 2015-05-04 NOTE — Assessment & Plan Note (Signed)
Stable on prozac, continue same. 

## 2015-05-08 ENCOUNTER — Other Ambulatory Visit: Payer: Self-pay | Admitting: Obstetrics and Gynecology

## 2015-05-10 ENCOUNTER — Encounter: Payer: Self-pay | Admitting: Gastroenterology

## 2015-05-16 ENCOUNTER — Other Ambulatory Visit: Payer: BLUE CROSS/BLUE SHIELD

## 2015-05-16 ENCOUNTER — Ambulatory Visit (INDEPENDENT_AMBULATORY_CARE_PROVIDER_SITE_OTHER): Payer: BLUE CROSS/BLUE SHIELD | Admitting: Family

## 2015-05-16 VITALS — BP 118/74 | HR 85

## 2015-05-16 DIAGNOSIS — E876 Hypokalemia: Secondary | ICD-10-CM

## 2015-05-16 DIAGNOSIS — I10 Essential (primary) hypertension: Secondary | ICD-10-CM | POA: Diagnosis not present

## 2015-05-16 NOTE — Progress Notes (Signed)
Reviewed

## 2015-05-16 NOTE — Progress Notes (Signed)
Pre visit review using our clinic review tool, if applicable. No additional management support is needed unless otherwise documented below in the visit note.  Patient presents in office today for a blood pressure check per OV note 05/02/15. Verified medication with the patient. Reading was as follow: BP 118/74 P 85.  Per Inda Castle, NP: Continue with current medication regimen and have BMET lab drawn today. Informed patient of the provider's instructions. She understood and did not have any concerns prior to leaving the nurse visit.

## 2015-05-16 NOTE — Patient Instructions (Signed)
Per Inda Castle, NP: Continue with current medication regimen and have BMET lab drawn today.

## 2015-05-17 ENCOUNTER — Other Ambulatory Visit: Payer: BLUE CROSS/BLUE SHIELD

## 2015-05-17 ENCOUNTER — Encounter (HOSPITAL_COMMUNITY): Payer: Self-pay

## 2015-05-17 ENCOUNTER — Encounter (HOSPITAL_COMMUNITY)
Admission: RE | Admit: 2015-05-17 | Discharge: 2015-05-17 | Disposition: A | Discharge status: Home / Self Care | Payer: BLUE CROSS/BLUE SHIELD | Source: Ambulatory Visit | Attending: Obstetrics and Gynecology | Admitting: Obstetrics and Gynecology

## 2015-05-17 DIAGNOSIS — Z01812 Encounter for preprocedural laboratory examination: Secondary | ICD-10-CM | POA: Diagnosis not present

## 2015-05-17 HISTORY — DX: Hypokalemia: E87.6

## 2015-05-17 LAB — BASIC METABOLIC PANEL
ANION GAP: 6 (ref 5–15)
BUN: 22 mg/dL (ref 6–23)
BUN: 20 mg/dL (ref 6–20)
CALCIUM: 9.2 mg/dL (ref 8.4–10.5)
CALCIUM: 8.8 mg/dL — AB (ref 8.9–10.3)
CO2: 30 mEq/L (ref 19–32)
CO2: 27 mmol/L (ref 22–32)
Chloride: 103 mEq/L (ref 96–112)
Chloride: 105 mmol/L (ref 101–111)
Creatinine, Ser: 1.16 mg/dL (ref 0.40–1.20)
Creatinine, Ser: 1.02 mg/dL — ABNORMAL HIGH (ref 0.44–1.00)
GFR: 63.28 mL/min (ref >60.00)
GLUCOSE: 100 mg/dL — AB (ref 70–99)
GLUCOSE: 104 mg/dL — AB (ref 65–99)
POTASSIUM: 3.5 meq/L (ref 3.5–5.1)
POTASSIUM: 3.8 mmol/L (ref 3.5–5.1)
SODIUM: 140 meq/L (ref 135–145)
SODIUM: 138 mmol/L (ref 135–145)

## 2015-05-17 LAB — ABO/RH: ABO/RH(D): O POS

## 2015-05-17 LAB — TYPE AND SCREEN
ABO/RH(D): O POS
ANTIBODY SCREEN: NEGATIVE

## 2015-05-17 LAB — CBC
HCT: 37.1 % (ref 36.0–46.0)
Hemoglobin: 12.3 g/dL (ref 12.0–15.0)
MCH: 31.7 pg (ref 26.0–34.0)
MCHC: 33.2 g/dL (ref 30.0–36.0)
MCV: 95.6 fL (ref 78.0–100.0)
PLATELETS: 219 10*3/uL (ref 150–400)
RBC: 3.88 MIL/uL (ref 3.87–5.11)
RDW: 13.7 % (ref 11.5–15.5)
WBC: 5.2 10*3/uL (ref 4.0–10.5)

## 2015-05-17 NOTE — Patient Instructions (Signed)
Your procedure is scheduled on:  Wednesday, May 23, 2015  Enter through the [employer] [facility] at:  6:00 AM  Pick up the phone at the desk and dial [phone].  Call this number if you have problems the morning of surgery: [phone].  Remember: Do NOT eat food or drink after:  Midnight Tuesday  Take these medicines the morning of surgery with a SIP OF WATER:  Lisinopril, Potassium, Prozac  Bring Asthma inhaler day of surgery.  Do NOT wear jewelry (body piercing), metal hair clips/bobby pins, make-up, or nail polish. Do NOT wear lotions, powders, or perfumes.  You may wear deoderant. Do NOT shave for 48 hours prior to surgery. Do NOT bring valuables to the hospital. Contacts, dentures, or bridgework may not be worn into surgery.  Leave suitcase in car.  After surgery it may be brought to your room.  For patients admitted to the hospital, checkout time is 11:00 AM the day of discharge.

## 2015-05-18 ENCOUNTER — Telehealth: Payer: Self-pay

## 2015-05-18 NOTE — Telephone Encounter (Signed)
Attempt home and work numbers. Home number not working and vm not available on cell number. PV and procedure canceled due to BMI 51.6. Pt needs new appt date and time.

## 2015-05-18 NOTE — Telephone Encounter (Signed)
Pt has a BMI of 51.6 and is scheduled for a colonoscopy at [facility] 06-14-15. Do you prefer an OV 1st or a direct colon at [facility]? Please advise.  Thanks, Terrius Gentile-PV

## 2015-05-18 NOTE — Telephone Encounter (Signed)
Mailed patient letter explaining new appointment date and time.

## 2015-05-18 NOTE — Telephone Encounter (Signed)
Please schedule OV next available. Thanks

## 2015-05-21 NOTE — Telephone Encounter (Signed)
Left message for patient to phone in to schedule office visit. Informed both PV and colonoscopy appointments had been cancelled.

## 2015-05-22 MED ORDER — CIPROFLOXACIN IN D5W 400 MG/200ML IV SOLN
400.0000 mg | INTRAVENOUS | Status: AC
  Administered 2015-05-23: 400 mg via INTRAVENOUS
  Filled 2015-05-22: qty 200

## 2015-05-22 MED ORDER — METRONIDAZOLE IN NACL 5-0.79 MG/ML-% IV SOLN
500.0000 mg | INTRAVENOUS | Status: AC
  Administered 2015-05-23: 500 mg via INTRAVENOUS
  Filled 2015-05-22: qty 100

## 2015-05-22 NOTE — Telephone Encounter (Signed)
-----   Message from Darlina Guys sent at 05/22/2015 10:38 AM EDT ----- Good morning.  Wanted to let you know that we have attempted to contact patient multiple times to set up CPAP. We've also mailed a letter to her home with our contact information.  If we can do anything else, please let me know.  Thank you Lenna Sciara  [phone]

## 2015-05-22 NOTE — Anesthesia Preprocedure Evaluation (Addendum)
Anesthesia Evaluation  Patient identified by MRN, date of birth, ID band Patient awake    Reviewed: Allergy & Precautions, NPO status , Patient's Chart, lab work & pertinent test results  Airway Mallampati: III  TM Distance: >3 FB Neck ROM: Full    Dental no notable dental hx. (+) Teeth Intact   Pulmonary sleep apnea and Continuous Positive Airway Pressure Ventilation ,    Pulmonary exam normal breath sounds clear to auscultation       Cardiovascular hypertension, Pt. on medications Normal cardiovascular exam Rhythm:Regular Rate:Normal  09/2014 stress test with EF 65%, low risk stress study   Neuro/Psych  Headaches, PSYCHIATRIC DISORDERS Anxiety Depression    GI/Hepatic negative GI ROS, Neg liver ROS,   Endo/Other  negative endocrine ROS  Renal/GU negative Renal ROS  negative genitourinary   Musculoskeletal  (+) Arthritis ,   Abdominal (+) + obese,   Peds  Hematology negative hematology ROS (+)   Anesthesia Other Findings   Reproductive/Obstetrics PMB                            Anesthesia Physical  Anesthesia Plan  ASA: III  Anesthesia Plan: General   Post-op Pain Management:    Induction: Intravenous  Airway Management Planned: Oral ETT  Additional Equipment:   Intra-op Plan:   Post-operative Plan: Extubation in OR  Informed Consent: I have reviewed the patients History and Physical, chart, labs and discussed the procedure including the risks, benefits and alternatives for the proposed anesthesia with the patient or authorized representative who has indicated his/her understanding and acceptance.   Dental advisory given  Plan Discussed with: CRNA, Anesthesiologist and Surgeon  Anesthesia Plan Comments:         Anesthesia Quick Evaluation

## 2015-05-22 NOTE — H&P (Signed)
50 y.o. yo complains of continued bleeding after D&C, hysteroscopy and a fibroid uterus.  Pt had been off and on bleeding since Novasure. EM was benign on D&C.  Pt  Had hernia repair in naval- no mesh. She had not been on hydrocodone in a while and was not using meloxicam right now- out. Off effexor since out as well. I refilled effexor and meloxicam while waiting for surgery.  She was only having SUI with full bladder. Only leaking if urge in am or during day if can't get to bathroom. Now having hot flashes and night sweats. She had her LO removed for "a spot." Pt has breast ca in family; no ov or endometrial ca. Pt has hx of endometriosis and is having pain as well as continued heavy bleeding.   Past Medical History  Diagnosis Date  . Hypertension   . Sleep apnea     CPAP  . Blood transfusion 1990    In [place], [place] with C/S Surgery - 5 units transfused  . Headache(784.0)     otc med prn  . Plantar fasciitis of left foot   . Arthritis     osteoarthritis - knees  . Chronic back pain   . Blood transfusion without reported diagnosis 1990    after a c/section  . Anxiety   . Depression   . Fibroid   . Vitamin D deficiency 09/06/2014  . Chest pain   . Low blood potassium    Past Surgical History  Procedure Laterality Date  . Cesarean section  1990    x 1  . Hernia repair    . Bilateral oophorectomy      Complete removal of left ovary and partial removal of right ovary.  . Knee surgery      right knee  . Tubal ligation  1990  . Hysteroscopy w/d&c N/A 01/11/2015    Procedure: DILATATION AND CURETTAGE /HYSTEROSCOPY;  Surgeon: Jerelyn Charles, MD;  Location: [facility];  Service: Gynecology;  Laterality: N/A;    Social History   Social History  . Marital Status: Divorced    Spouse Name: N/A  . Number of Children: N/A  . Years of Education: N/A   Occupational History  . Not on file.   Social History Main Topics  . Smoking status: Never Smoker   . Smokeless tobacco: Never Used  .  Alcohol Use: No  . Drug Use: No  . Sexual Activity:    Partners: Male    Birth Control/ Protection: Surgical     Comment: Tubal   Other Topics Concern  . Not on file   Social History Narrative   Divorced   Lives with son and grand-daughter   [place] (lives in [place])   [place] lives with patient   50- Daughter [name] (lives in [place])   Works at [employer]   Completed some college   Careers adviser       No current facility-administered medications on file prior to encounter.   Current Outpatient Prescriptions on File Prior to Encounter  Medication Sig Dispense Refill  . aspirin-acetaminophen-caffeine (EXCEDRIN MIGRAINE) 250-250-65 MG per tablet Take 2 tablets by mouth every 6 (six) hours as needed for headache or migraine.    Marland Kitchen albuterol (PROVENTIL HFA;VENTOLIN HFA) 108 (90 BASE) MCG/ACT inhaler Inhale 1-2 puffs into the lungs every 6 (six) hours as needed for wheezing or shortness of breath. 1 Inhaler 0  . HYDROcodone-acetaminophen (NORCO) 5-325 MG tablet Take 1  tablet by mouth every 6 (six) hours as needed for severe pain. (Patient not taking: Reported on 05/17/2015) 10 tablet 0  . ibuprofen (ADVIL,MOTRIN) 600 MG tablet Take 1 tablet (600 mg total) by mouth every 6 (six) hours as needed for moderate pain or cramping. (Patient not taking: Reported on 05/17/2015) 30 tablet 0  . nystatin (MYCOSTATIN/NYSTOP) 100000 UNIT/GM POWD Apply twice daily to affected area as needed 30 g 1  . [DISCONTINUED] LISINOPRIL PO Take by mouth.      Allergies  Allergen Reactions  . Penicillins Itching    Has patient had a PCN reaction causing immediate rash, facial/tongue/throat swelling, SOB or lightheadedness with hypotension: No Has patient had a PCN reaction causing severe rash involving mucus membranes or skin necrosis: No Has patient had a PCN reaction that required hospitalization No Has patient had a PCN reaction occurring within the last 10  years: No If all of the above answers are "NO", then may proceed with Cephalosporin use.   . Venlafaxine Other (See Comments)    "felt spacey"    @VITALS2 @  Lungs: clear to ascultation Cor:  RRR Abdomen:  soft, nontender, nondistended. Ex:  no cords, erythema Pelvic:  Normal EFG, normal s/s mobile uterus without masses.  [place] 10x6x6 with 6 cm anterior fibroid.    A:  50 yo with symptomatic fibroid uterus, perimenopausal bleeding, some pain and risk of future endometrial hyperplasia secondary habitus.  For Robotic TLH/RSO/salpingectomy/cautery of endometriosis and cystoscopy.   P: All risks, benefits and alternatives d/w patient and she desires to proceed.  Patient has undergone a modified bowel prep and will receive preop antibiotics and SCDs during the operation.     Hailey Vance A

## 2015-05-22 NOTE — Telephone Encounter (Signed)
Is out of work until 07-02-2015 and wanted to have procedure done while she was out on Medical Leave. Upset her appointment for today was cancelled and she was not told. I advised her we tried many times to reach her unsuccesfully. Want to speak to nurse about moving appointment sooner.

## 2015-05-23 ENCOUNTER — Encounter (HOSPITAL_COMMUNITY): Payer: Self-pay | Admitting: Emergency Medicine

## 2015-05-23 ENCOUNTER — Encounter (HOSPITAL_COMMUNITY)
Admission: RE | Disposition: A | Discharge status: Home / Self Care | Payer: Self-pay | Source: Ambulatory Visit | Attending: Obstetrics and Gynecology

## 2015-05-23 ENCOUNTER — Ambulatory Visit (HOSPITAL_COMMUNITY)
Admission: RE | Admit: 2015-05-23 | Discharge: 2015-05-24 | Disposition: A | Discharge status: Home / Self Care | Payer: BLUE CROSS/BLUE SHIELD | Source: Ambulatory Visit | Attending: Obstetrics and Gynecology | Admitting: Obstetrics and Gynecology

## 2015-05-23 ENCOUNTER — Ambulatory Visit (HOSPITAL_COMMUNITY): Payer: BLUE CROSS/BLUE SHIELD | Admitting: Anesthesiology

## 2015-05-23 DIAGNOSIS — F418 Other specified anxiety disorders: Secondary | ICD-10-CM | POA: Insufficient documentation

## 2015-05-23 DIAGNOSIS — I10 Essential (primary) hypertension: Secondary | ICD-10-CM | POA: Diagnosis not present

## 2015-05-23 DIAGNOSIS — M170 Bilateral primary osteoarthritis of knee: Secondary | ICD-10-CM | POA: Insufficient documentation

## 2015-05-23 DIAGNOSIS — Z7982 Long term (current) use of aspirin: Secondary | ICD-10-CM | POA: Insufficient documentation

## 2015-05-23 DIAGNOSIS — D259 Leiomyoma of uterus, unspecified: Secondary | ICD-10-CM | POA: Diagnosis present

## 2015-05-23 DIAGNOSIS — Z9889 Other specified postprocedural states: Secondary | ICD-10-CM

## 2015-05-23 DIAGNOSIS — N800 Endometriosis of uterus: Secondary | ICD-10-CM | POA: Insufficient documentation

## 2015-05-23 DIAGNOSIS — Z880 Allergy status to penicillin: Secondary | ICD-10-CM | POA: Diagnosis not present

## 2015-05-23 DIAGNOSIS — M79605 Pain in left leg: Secondary | ICD-10-CM

## 2015-05-23 DIAGNOSIS — E559 Vitamin D deficiency, unspecified: Secondary | ICD-10-CM | POA: Insufficient documentation

## 2015-05-23 DIAGNOSIS — G4730 Sleep apnea, unspecified: Secondary | ICD-10-CM | POA: Diagnosis not present

## 2015-05-23 DIAGNOSIS — N950 Postmenopausal bleeding: Secondary | ICD-10-CM | POA: Diagnosis not present

## 2015-05-23 DIAGNOSIS — G8918 Other acute postprocedural pain: Secondary | ICD-10-CM

## 2015-05-23 HISTORY — PX: ROBOTIC ASSISTED TOTAL HYSTERECTOMY WITH SALPINGECTOMY: SHX6679

## 2015-05-23 HISTORY — PX: CYSTOSCOPY: SHX5120

## 2015-05-23 LAB — PREGNANCY, URINE: PREG TEST UR: NEGATIVE

## 2015-05-23 SURGERY — ROBOTIC ASSISTED TOTAL HYSTERECTOMY WITH SALPINGECTOMY
Anesthesia: General

## 2015-05-23 MED ORDER — MIDAZOLAM HCL 2 MG/2ML IJ SOLN
INTRAMUSCULAR | Status: AC
  Filled 2015-05-23: qty 2

## 2015-05-23 MED ORDER — HYDROMORPHONE HCL 1 MG/ML IJ SOLN
INTRAMUSCULAR | Status: AC
  Filled 2015-05-23: qty 1

## 2015-05-23 MED ORDER — POTASSIUM CHLORIDE CRYS ER 20 MEQ PO TBCR
20.0000 meq | EXTENDED_RELEASE_TABLET | Freq: Every day | ORAL | Status: DC
  Administered 2015-05-23 – 2015-05-24 (×2): 20 meq via ORAL
  Filled 2015-05-23 (×4): qty 1

## 2015-05-23 MED ORDER — EPHEDRINE SULFATE 50 MG/ML IJ SOLN
INTRAMUSCULAR | Status: DC | PRN
  Administered 2015-05-23: 5 mg via INTRAVENOUS
  Administered 2015-05-23: 10 mg via INTRAVENOUS
  Administered 2015-05-23 (×3): 5 mg via INTRAVENOUS

## 2015-05-23 MED ORDER — SUGAMMADEX SODIUM 200 MG/2ML IV SOLN
INTRAVENOUS | Status: DC | PRN
  Administered 2015-05-23: 275.8 mg via INTRAVENOUS

## 2015-05-23 MED ORDER — STERILE WATER FOR IRRIGATION IR SOLN
Status: DC | PRN
  Administered 2015-05-23: 1000 mL

## 2015-05-23 MED ORDER — SUGAMMADEX SODIUM 500 MG/5ML IV SOLN
INTRAVENOUS | Status: AC
  Filled 2015-05-23: qty 5

## 2015-05-23 MED ORDER — PROPOFOL 10 MG/ML IV BOLUS
INTRAVENOUS | Status: DC | PRN
  Administered 2015-05-23: 200 mg via INTRAVENOUS

## 2015-05-23 MED ORDER — ROCURONIUM BROMIDE 100 MG/10ML IV SOLN
INTRAVENOUS | Status: DC | PRN
  Administered 2015-05-23: 50 mg via INTRAVENOUS
  Administered 2015-05-23 (×2): 10 mg via INTRAVENOUS
  Administered 2015-05-23: 50 mg via INTRAVENOUS

## 2015-05-23 MED ORDER — EPHEDRINE 5 MG/ML INJ
INTRAVENOUS | Status: AC
  Filled 2015-05-23: qty 10

## 2015-05-23 MED ORDER — ROPIVACAINE HCL 5 MG/ML IJ SOLN
INTRAMUSCULAR | Status: AC
  Filled 2015-05-23: qty 30

## 2015-05-23 MED ORDER — ARTIFICIAL TEARS OP OINT
TOPICAL_OINTMENT | OPHTHALMIC | Status: AC
  Filled 2015-05-23: qty 3.5

## 2015-05-23 MED ORDER — FENTANYL CITRATE (PF) 250 MCG/5ML IJ SOLN
INTRAMUSCULAR | Status: AC
  Filled 2015-05-23: qty 5

## 2015-05-23 MED ORDER — FENTANYL CITRATE (PF) 100 MCG/2ML IJ SOLN
INTRAMUSCULAR | Status: DC | PRN
  Administered 2015-05-23: 100 ug via INTRAVENOUS
  Administered 2015-05-23: 150 ug via INTRAVENOUS

## 2015-05-23 MED ORDER — KETOROLAC TROMETHAMINE 30 MG/ML IJ SOLN: 30.0000 mg | Freq: Four times a day (QID) | INTRAMUSCULAR | Status: DC

## 2015-05-23 MED ORDER — OXYCODONE HCL 5 MG PO TABS: 5.0000 mg | ORAL_TABLET | Freq: Once | ORAL | Status: DC | PRN

## 2015-05-23 MED ORDER — ALBUTEROL SULFATE (2.5 MG/3ML) 0.083% IN NEBU: 3.0000 mL | INHALATION_SOLUTION | Freq: Four times a day (QID) | RESPIRATORY_TRACT | Status: DC | PRN

## 2015-05-23 MED ORDER — LISINOPRIL-HYDROCHLOROTHIAZIDE 20-25 MG PO TABS: 0.5000 | ORAL_TABLET | Freq: Every day | ORAL | Status: DC

## 2015-05-23 MED ORDER — DEXAMETHASONE SODIUM PHOSPHATE 10 MG/ML IJ SOLN
INTRAMUSCULAR | Status: DC | PRN
  Administered 2015-05-23: 4 mg via INTRAVENOUS

## 2015-05-23 MED ORDER — MENTHOL 3 MG MT LOZG: 1.0000 | LOZENGE | OROMUCOSAL | Status: DC | PRN

## 2015-05-23 MED ORDER — MIDAZOLAM HCL 2 MG/2ML IJ SOLN
INTRAMUSCULAR | Status: DC | PRN
  Administered 2015-05-23: 2 mg via INTRAVENOUS

## 2015-05-23 MED ORDER — NEOSTIGMINE METHYLSULFATE 10 MG/10ML IV SOLN
INTRAVENOUS | Status: AC
Start: 2015-05-23 — End: 2015-05-23
  Filled 2015-05-23: qty 1

## 2015-05-23 MED ORDER — HYDROCHLOROTHIAZIDE 12.5 MG PO CAPS
12.5000 mg | ORAL_CAPSULE | Freq: Every day | ORAL | Status: DC
  Filled 2015-05-23 (×2): qty 1

## 2015-05-23 MED ORDER — HYDROMORPHONE HCL 1 MG/ML IJ SOLN
0.2500 mg | INTRAMUSCULAR | Status: DC | PRN
  Administered 2015-05-23 (×4): 0.25 mg via INTRAVENOUS

## 2015-05-23 MED ORDER — IBUPROFEN 800 MG PO TABS
800.0000 mg | ORAL_TABLET | Freq: Three times a day (TID) | ORAL | Status: DC | PRN
  Administered 2015-05-24: 800 mg via ORAL
  Filled 2015-05-23: qty 1

## 2015-05-23 MED ORDER — HYDROMORPHONE HCL 1 MG/ML IJ SOLN
INTRAMUSCULAR | Status: AC
  Administered 2015-05-23: 0.25 mg via INTRAVENOUS
  Filled 2015-05-23: qty 1

## 2015-05-23 MED ORDER — KETOROLAC TROMETHAMINE 30 MG/ML IJ SOLN
INTRAMUSCULAR | Status: DC | PRN
  Administered 2015-05-23: 30 mg via INTRAVENOUS

## 2015-05-23 MED ORDER — ROPIVACAINE HCL 5 MG/ML IJ SOLN
INTRAMUSCULAR | Status: DC | PRN
  Administered 2015-05-23: 60 mL

## 2015-05-23 MED ORDER — PROMETHAZINE HCL 25 MG/ML IJ SOLN: 6.2500 mg | INTRAMUSCULAR | Status: DC | PRN

## 2015-05-23 MED ORDER — LACTATED RINGERS IR SOLN
Status: DC | PRN
  Administered 2015-05-23: 3000 mL

## 2015-05-23 MED ORDER — ONDANSETRON HCL 4 MG/2ML IJ SOLN
INTRAMUSCULAR | Status: DC | PRN
  Administered 2015-05-23: 4 mg via INTRAVENOUS

## 2015-05-23 MED ORDER — SCOPOLAMINE 1 MG/3DAYS TD PT72
1.0000 | MEDICATED_PATCH | Freq: Once | TRANSDERMAL | Status: DC
  Administered 2015-05-23: 1.5 mg via TRANSDERMAL

## 2015-05-23 MED ORDER — ONDANSETRON HCL 4 MG PO TABS: 4.0000 mg | ORAL_TABLET | Freq: Four times a day (QID) | ORAL | Status: DC | PRN

## 2015-05-23 MED ORDER — ONDANSETRON HCL 4 MG/2ML IJ SOLN
INTRAMUSCULAR | Status: AC
  Filled 2015-05-23: qty 2

## 2015-05-23 MED ORDER — SODIUM CHLORIDE 0.9 % IJ SOLN
INTRAMUSCULAR | Status: AC
Start: 2015-05-23 — End: 2015-05-23
  Filled 2015-05-23: qty 50

## 2015-05-23 MED ORDER — PROPOFOL 10 MG/ML IV BOLUS
INTRAVENOUS | Status: AC
  Filled 2015-05-23: qty 20

## 2015-05-23 MED ORDER — GLYCOPYRROLATE 0.2 MG/ML IJ SOLN
INTRAMUSCULAR | Status: AC
  Filled 2015-05-23: qty 3

## 2015-05-23 MED ORDER — OXYCODONE-ACETAMINOPHEN 5-325 MG PO TABS
1.0000 | ORAL_TABLET | ORAL | Status: DC | PRN
  Administered 2015-05-23: 1 via ORAL
  Administered 2015-05-24: 2 via ORAL
  Filled 2015-05-23: qty 2
  Filled 2015-05-23: qty 1

## 2015-05-23 MED ORDER — LACTATED RINGERS IV SOLN
INTRAVENOUS | Status: DC
  Administered 2015-05-23: 125 mL/h via INTRAVENOUS
  Administered 2015-05-23: 07:00:00 via INTRAVENOUS

## 2015-05-23 MED ORDER — KETOROLAC TROMETHAMINE 30 MG/ML IJ SOLN
INTRAMUSCULAR | Status: AC
  Filled 2015-05-23: qty 1

## 2015-05-23 MED ORDER — DEXAMETHASONE SODIUM PHOSPHATE 4 MG/ML IJ SOLN
INTRAMUSCULAR | Status: AC
  Filled 2015-05-23: qty 1

## 2015-05-23 MED ORDER — LIDOCAINE HCL (CARDIAC) 20 MG/ML IV SOLN
INTRAVENOUS | Status: DC | PRN
  Administered 2015-05-23: 100 mg via INTRAVENOUS

## 2015-05-23 MED ORDER — SCOPOLAMINE 1 MG/3DAYS TD PT72
MEDICATED_PATCH | TRANSDERMAL | Status: AC
  Administered 2015-05-23: 1.5 mg via TRANSDERMAL
  Filled 2015-05-23: qty 1

## 2015-05-23 MED ORDER — ROCURONIUM BROMIDE 100 MG/10ML IV SOLN
INTRAVENOUS | Status: AC
  Filled 2015-05-23: qty 1

## 2015-05-23 MED ORDER — ALBUMIN HUMAN 5 % IV SOLN
INTRAVENOUS | Status: DC | PRN
Start: 2015-05-23 — End: 2015-05-23
  Administered 2015-05-23: 11:00:00 via INTRAVENOUS

## 2015-05-23 MED ORDER — FLUOXETINE HCL 20 MG PO CAPS
20.0000 mg | ORAL_CAPSULE | Freq: Every day | ORAL | Status: DC
  Administered 2015-05-24: 20 mg via ORAL
  Filled 2015-05-23 (×2): qty 1

## 2015-05-23 MED ORDER — OXYCODONE HCL 5 MG/5ML PO SOLN: 5.0000 mg | Freq: Once | ORAL | Status: DC | PRN

## 2015-05-23 MED ORDER — LISINOPRIL 10 MG PO TABS
10.0000 mg | ORAL_TABLET | Freq: Every day | ORAL | Status: DC
  Filled 2015-05-23 (×2): qty 1

## 2015-05-23 MED ORDER — LIDOCAINE HCL (CARDIAC) 20 MG/ML IV SOLN
INTRAVENOUS | Status: AC
  Filled 2015-05-23: qty 5

## 2015-05-23 MED ORDER — ACETAMINOPHEN 10 MG/ML IV SOLN
1000.0000 mg | Freq: Once | INTRAVENOUS | Status: AC
  Administered 2015-05-23: 1000 mg via INTRAVENOUS
  Filled 2015-05-23: qty 100

## 2015-05-23 MED ORDER — ONDANSETRON HCL 4 MG/2ML IJ SOLN: 4.0000 mg | Freq: Four times a day (QID) | INTRAMUSCULAR | Status: DC | PRN

## 2015-05-23 SURGICAL SUPPLY — 73 items
APL SKNCLS STERI-STRIP NONHPOA (GAUZE/BANDAGES/DRESSINGS) ×2
APL SRG 38 LTWT LNG FL B (MISCELLANEOUS) ×2
APPLICATOR ARISTA FLEXITIP XL (MISCELLANEOUS) ×1 IMPLANT
BARRIER ADHS 3X4 INTERCEED (GAUZE/BANDAGES/DRESSINGS) ×3 IMPLANT
BENZOIN TINCTURE PRP APPL 2/3 (GAUZE/BANDAGES/DRESSINGS) ×3 IMPLANT
BRR ADH 4X3 ABS CNTRL BYND (GAUZE/BANDAGES/DRESSINGS) ×2
CLOTH BEACON ORANGE TIMEOUT ST (SAFETY) ×3 IMPLANT
CONT PATH 16OZ SNAP LID 3702 (MISCELLANEOUS) ×3 IMPLANT
COVER BACK TABLE 60X90IN (DRAPES) ×6 IMPLANT
COVER TIP SHEARS 8 DVNC (MISCELLANEOUS) ×2 IMPLANT
COVER TIP SHEARS 8MM DA VINCI (MISCELLANEOUS) ×1
DECANTER SPIKE VIAL GLASS SM (MISCELLANEOUS) ×4 IMPLANT
DRSG OPSITE POSTOP 3X4 (GAUZE/BANDAGES/DRESSINGS) ×1 IMPLANT
DURAPREP 26ML APPLICATOR (WOUND CARE) ×3 IMPLANT
ELECT REM PT RETURN 9FT ADLT (ELECTROSURGICAL) ×3
ELECTRODE REM PT RTRN 9FT ADLT (ELECTROSURGICAL) ×2 IMPLANT
GAUZE VASELINE 3X9 (GAUZE/BANDAGES/DRESSINGS) IMPLANT
GLOVE BIO SURGEON STRL SZ 6.5 (GLOVE) ×3 IMPLANT
GLOVE BIO SURGEON STRL SZ7 (GLOVE) ×7 IMPLANT
GLOVE BIOGEL PI IND STRL 6.5 (GLOVE) ×2 IMPLANT
GLOVE BIOGEL PI IND STRL 7.0 (GLOVE) ×6 IMPLANT
GLOVE BIOGEL PI INDICATOR 6.5 (GLOVE) ×2
GLOVE BIOGEL PI INDICATOR 7.0 (GLOVE) ×3
GLOVE ECLIPSE 6.5 STRL STRAW (GLOVE) ×10 IMPLANT
GRASPER BIPOLAR FEN DA VINCI (INSTRUMENTS)
GRASPER BPLR FEN DVNC (INSTRUMENTS) IMPLANT
GYRUS RUMI II 2.5CM BLUE (DISPOSABLE) ×3
GYRUS RUMI II 3.5CM BLUE (DISPOSABLE)
GYRUS RUMI II 4.0CM BLUE (DISPOSABLE)
HEMOSTAT ARISTA ABSORB 3G PWDR (MISCELLANEOUS) ×1 IMPLANT
KIT ACCESSORY DA VINCI DISP (KITS) ×1
KIT ACCESSORY DVNC DISP (KITS) ×2 IMPLANT
LEGGING LITHOTOMY PAIR STRL (DRAPES) ×3 IMPLANT
LIQUID BAND (GAUZE/BANDAGES/DRESSINGS) ×3 IMPLANT
MANIPULATOR UTERINE 4.5 ZUMI (MISCELLANEOUS) IMPLANT
NEEDLE INSUFFLATION 120MM (ENDOMECHANICALS) ×2 IMPLANT
NEEDLE INSUFFLATION 150MM (ENDOMECHANICALS) ×1 IMPLANT
OCCLUDER COLPOPNEUMO (BALLOONS) IMPLANT
PACK ROBOT WH (CUSTOM PROCEDURE TRAY) ×3 IMPLANT
PACK ROBOTIC GOWN (GOWN DISPOSABLE) ×3 IMPLANT
PAD PREP 24X48 CUFFED NSTRL (MISCELLANEOUS) ×6 IMPLANT
PAD TRENDELENBURG POSITION (MISCELLANEOUS) ×3 IMPLANT
RUMI II 3.0CM BLUE KOH-EFFICIE (DISPOSABLE) IMPLANT
RUMI II GYRUS 2.5CM BLUE (DISPOSABLE) IMPLANT
RUMI II GYRUS 3.5CM BLUE (DISPOSABLE) IMPLANT
RUMI II GYRUS 4.0CM BLUE (DISPOSABLE) IMPLANT
SET CYSTO W/LG BORE CLAMP LF (SET/KITS/TRAYS/PACK) ×1 IMPLANT
SET IRRIG TUBING LAPAROSCOPIC (IRRIGATION / IRRIGATOR) ×4 IMPLANT
SET TRI-LUMEN FLTR TB AIRSEAL (TUBING) IMPLANT
STRIP CLOSURE SKIN 1/2X4 (GAUZE/BANDAGES/DRESSINGS) ×3 IMPLANT
SUT DVC VLOC 180 0 12IN GS21 (SUTURE)
SUT VIC AB 0 CT1 27 (SUTURE)
SUT VIC AB 0 CT1 27XBRD ANBCTR (SUTURE) IMPLANT
SUT VIC AB 2-0 CT2 27 (SUTURE) ×6 IMPLANT
SUT VIC AB 2-0 UR6 27 (SUTURE) ×3 IMPLANT
SUT VIC AB 3-0 PS2 18 (SUTURE) ×1 IMPLANT
SUT VICRYL RAPIDE 3 0 (SUTURE) ×6 IMPLANT
SUT VLOC 180 0 9IN  GS21 (SUTURE) ×1
SUT VLOC 180 0 9IN GS21 (SUTURE) IMPLANT
SUTURE DVC VLC 180 0 12IN GS21 (SUTURE) IMPLANT
SYR 50ML LL SCALE MARK (SYRINGE) ×3 IMPLANT
TIP RUMI ORANGE 6.7MMX12CM (TIP) IMPLANT
TIP UTERINE 5.1X6CM LAV DISP (MISCELLANEOUS) IMPLANT
TIP UTERINE 6.7X10CM GRN DISP (MISCELLANEOUS) IMPLANT
TIP UTERINE 6.7X6CM WHT DISP (MISCELLANEOUS) IMPLANT
TIP UTERINE 6.7X8CM BLUE DISP (MISCELLANEOUS) ×1 IMPLANT
TOWEL OR 17X24 6PK STRL BLUE (TOWEL DISPOSABLE) ×6 IMPLANT
TRAY FOLEY CATH SILVER 14FR (SET/KITS/TRAYS/PACK) ×3 IMPLANT
TROCAR DILATING TIP 12MM 150MM (ENDOMECHANICALS) ×3 IMPLANT
TROCAR DISP BLADELESS 8 DVNC (TROCAR) ×2 IMPLANT
TROCAR DISP BLADELESS 8MM (TROCAR) ×1
TROCAR PORT AIRSEAL 5X120 (TROCAR) IMPLANT
WATER STERILE IRR 1000ML POUR (IV SOLUTION) ×8 IMPLANT

## 2015-05-23 NOTE — Progress Notes (Signed)
There has been no change in the patients history, status or exam since the history and physical.  Filed Vitals:   05/23/15 0634  BP: 141/86  Pulse: 71  Temp: 98.8 F (37.1 C)  TempSrc: Oral  Resp: 16  SpO2: 97%    Lab Results  Component Value Date   WBC 5.2 05/17/2015   HGB 12.3 05/17/2015   HCT 37.1 05/17/2015   MCV 95.6 05/17/2015   PLT 219 05/17/2015    Fateh Kindle A

## 2015-05-23 NOTE — Brief Op Note (Signed)
05/23/2015  12:09 PM  PATIENT:  Hailey Vance  50 y.o. female  PRE-OPERATIVE DIAGNOSIS:  POST MENOPAUSAL BLEEDING  POST-OPERATIVE DIAGNOSIS:  POST MENOPAUSAL BLEEDING  PROCEDURE:  Procedure(s): ROBOTIC ASSISTED TOTAL HYSTERECTOMY WITH SALPINGECTOMYwith right oophorectomy (Bilateral) CYSTOSCOPY (N/A)  SURGEON:  Surgeon(s) and Role:    * Bobbye Charleston, MD - Primary    * Jerelyn Charles, MD - Assisting   ANESTHESIA:   general  EBL:  Total I/O In: [id number] [I.V.:1200; IV Piggyback:250] Out: 900 [Urine:400; Blood:500]  LOCAL MEDICATIONS USED:  BUPIVICAINE, arista  SPECIMEN:  Source of Specimen:  uterus, cervix, bilateral tubes, R Ovary  DISPOSITION OF SPECIMEN:  PATHOLOGY  COUNTS:  YES  TOURNIQUET:  * No tourniquets in log *  DICTATION: .Note written in EPIC  PLAN OF CARE: Admit for overnight observation  PATIENT DISPOSITION:  PACU - hemodynamically stable.   Delay start of Pharmacological VTE agent (>24hrs) due to surgical blood loss or risk of bleeding: not applicable  Complications:  None.  Findings:  12 weeks size boggy uterus.  R ovary was normal.  L ovary was absent.  A portion of the L tube was still attached to uterus.  The ureters were identified during multiple points of the case and were always out of the field of dissection.  On cystoscopy, the bladder was intact and bilateral spill was seen from each ureteral orriface.    Medications: Flagyl and clinda.  Bupivicaine.  Arista.  Technique:  After adequate anesthesia was achieved the patient was positioned, prepped and draped in usual sterile fashion.  A speculum was placed in the vagina and the cervix dilated with pratt dilators.  The 8 cm Rumi and 2.5 cm Koh ring were assembled and placed in proper fashion.  The  Speculum was removed and the bladder catheterized with a foley.    Attention was turned to the abdomen where a 1 cm incision was made 1 cm above the umbilicus.  The veress needle was introduced without  aspiration of bowel contents or blood and the abdomen insufflated. The long 12 mm trocar was placed and the other three trocar sites were marked out, all approximately 10 cm from each other and the camera.  Two 8.5 mm trocars were placed on either side of the camera port and a 5 mm assistant port was placed 3 cm above the line of the other trocars.  All trocars were inserted under direct visualization of the camera.  The patient was placed in trendelenburg and then the Robot docked.  The Vessel sealer was placed on arm 2 and the Hot shears on arm 1 and introduced under direct visualization of the camera.  There was omentum densely and widely adhesed to the anterior abdominal wall.  Some areas lower were able to be removed with the Vessel sealer cautery.  I then broke scrub and sat down at the console.  The above findings were noted and the ureters identified well out of the field of dissection. Some areas lower were able to be removed with the Vessel sealer cautery. The right ovary and  tube were isolated and he Utero-ovarian ligament was then divided with the vessel sealer.  The posterior broad ligament was then divided with the hot shears until the uterosacral ligament.  The Broad and cardinal ligaments were then cauterized against the cervix to the level of the Koh ring, securing the uterine artery.  Each pedicle was then incised with the shears.  The anterior leaf was then incised at the reflection  of the vessico-uterine junction and the lateral bladder retracted inferiorly after the round ligament had been divided with the PK forceps.  The left IP ligament was identified still going to uterus and this was divided with the vessel sealer.  The round ligaments were very thick and were divided with multiple bites of the vessel sealer; the posterior leaf of the broad ligament then divided with the hot shears. The broad and cardinal ligaments were then cauterized on the left in the same way.   At the level of the  internal os, the uterine arteries were bilaterally cauterized with the PK.  The ureters were identified well out of the field of dissection.  .   The bladder was then able to be retracted inferiorly and the vesico-uterine fascia was incised in the midline until the bladder was removed one cm below the Koh ring.  The hot shears then circumferentially incised the vagina at the level of the reflection on the [facility] ring.  Once the uterus and cervix were amputated, cautery was used to insure hemostasis of the cuff.  Once hemostasis was achieved, the instruments were changed to the mega needle driver and mega suture cut needle driver and the cuff was closed with a running stitches of 0-vicryl V loc.   Cautery was used to ensure hemostasis of the right pedicles very superficially.   The Robot was then undocked and I scrubbed back in.  The needle was removed and Bupivicaine was introduced into the pelvis, the arista sprayed over the cuff and pedicles since they were oozing slightly.  The camera and trocars were removed and the abdomen was desufflated.  The fascia of the 12 mm trocar was closed with two figure of 8 stitches of 0 vicryl.  The skin incisions were closed with subcuticular stitches of 3-0 vicryl Rapide and Dermabond.  All instruments were removed from the vagina and cystoscopy performed, revealing an intact bladder and vigourous spill of urine from each ureteral orifice.  The cystoscope was removed and the patient taken to the recovery room in stable condition.  Emerald Shor A

## 2015-05-23 NOTE — Anesthesia Postprocedure Evaluation (Signed)
Anesthesia Post Note  Patient: Hailey Vance  Procedure(s) Performed: Procedure(s) (LRB): ROBOTIC ASSISTED TOTAL HYSTERECTOMY WITH SALPINGECTOMYwith right oophorectomy (Bilateral) CYSTOSCOPY (N/A)  Patient location during evaluation: PACU Anesthesia Type: General Level of consciousness: awake and alert Pain management: pain level controlled Vital Signs Assessment: post-procedure vital signs reviewed and stable Respiratory status: spontaneous breathing, nonlabored ventilation, respiratory function stable and patient connected to nasal cannula oxygen Cardiovascular status: blood pressure returned to baseline and stable Postop Assessment: no signs of nausea or vomiting Anesthetic complications: no    Last Vitals:  Filed Vitals:   05/23/15 1430 05/23/15 1447  BP: 105/74 105/64  Pulse: 81 76  Temp: 36.3 C 36.5 C  Resp: 19 18    Last Pain:  Filed Vitals:   05/23/15 1456  PainSc: 3                  Tarrence Enck JENNETTE

## 2015-05-23 NOTE — Transfer of Care (Signed)
Immediate Anesthesia Transfer of Care Note  Patient: Hailey Vance  Procedure(s) Performed: Procedure(s): ROBOTIC ASSISTED TOTAL HYSTERECTOMY WITH SALPINGECTOMYwith right oophorectomy (Bilateral) CYSTOSCOPY (N/A)  Patient Location: PACU  Anesthesia Type:General  Level of Consciousness: awake, alert  and oriented  Airway & Oxygen Therapy: Patient Spontanous Breathing and Patient connected to nasal cannula oxygen  Post-op Assessment: Report given to RN, Post -op Vital signs reviewed and stable and Patient moving all extremities  Post vital signs: Reviewed and stable  Last Vitals:  Filed Vitals:   05/23/15 0634  BP: 141/86  Pulse: 71  Temp: 37.1 C  Resp: 16    Complications: No apparent anesthesia complications

## 2015-05-23 NOTE — Anesthesia Procedure Notes (Signed)
Procedure Name: Intubation Date/Time: 05/23/2015 7:30 AM Performed by: Hewitt Blade Pre-anesthesia Checklist: Patient identified, Emergency Drugs available, Suction available and Patient being monitored Patient Re-evaluated:Patient Re-evaluated prior to inductionOxygen Delivery Method: Circle system utilized Preoxygenation: Pre-oxygenation with 100% oxygen Intubation Type: IV induction Ventilation: Mask ventilation without difficulty Laryngoscope Size: Mac and 3 Grade View: Grade I Tube type: Oral Tube size: 7.0 mm Number of attempts: 1 Airway Equipment and Method: Stylet Placement Confirmation: ETT inserted through vocal cords under direct vision,  positive ETCO2 and breath sounds checked- equal and bilateral Secured at: 21 cm Tube secured with: Tape Dental Injury: Teeth and Oropharynx as per pre-operative assessment

## 2015-05-23 NOTE — Op Note (Signed)
05/23/2015  12:09 PM  PATIENT:  Hailey Vance  50 y.o. female  PRE-OPERATIVE DIAGNOSIS:  POST MENOPAUSAL BLEEDING  POST-OPERATIVE DIAGNOSIS:  POST MENOPAUSAL BLEEDING  PROCEDURE:  Procedure(s): ROBOTIC ASSISTED TOTAL HYSTERECTOMY WITH SALPINGECTOMYwith right oophorectomy (Bilateral) CYSTOSCOPY (N/A)  SURGEON:  Surgeon(s) and Role:    * Bobbye Charleston, MD - Primary    * Jerelyn Charles, MD - Assisting   ANESTHESIA:   general  EBL:  Total I/O In: [id number] [I.V.:1200; IV Piggyback:250] Out: 900 [Urine:400; Blood:500]  LOCAL MEDICATIONS USED:  BUPIVICAINE, arista  SPECIMEN:  Source of Specimen:  uterus, cervix, bilateral tubes, R Ovary  DISPOSITION OF SPECIMEN:  PATHOLOGY  COUNTS:  YES  TOURNIQUET:  * No tourniquets in log *  DICTATION: .Note written in EPIC  PLAN OF CARE: Admit for overnight observation  PATIENT DISPOSITION:  PACU - hemodynamically stable.   Delay start of Pharmacological VTE agent (>24hrs) due to surgical blood loss or risk of bleeding: not applicable  Complications:  None.  Findings:  12 weeks size boggy uterus.  R ovary was normal.  L ovary was absent.  A portion of the L tube was still attached to uterus.  The ureters were identified during multiple points of the case and were always out of the field of dissection.  On cystoscopy, the bladder was intact and bilateral spill was seen from each ureteral orriface.    Medications: Flagyl and clinda.  Bupivicaine.  Arista.  Technique:  After adequate anesthesia was achieved the patient was positioned, prepped and draped in usual sterile fashion.  A speculum was placed in the vagina and the cervix dilated with pratt dilators.  The 8 cm Rumi and 2.5 cm Koh ring were assembled and placed in proper fashion.  The  Speculum was removed and the bladder catheterized with a foley.    Attention was turned to the abdomen where a 1 cm incision was made 1 cm above the umbilicus.  The veress needle was introduced without  aspiration of bowel contents or blood and the abdomen insufflated. The long 12 mm trocar was placed and the other three trocar sites were marked out, all approximately 10 cm from each other and the camera.  Two 8.5 mm trocars were placed on either side of the camera port and a 5 mm assistant port was placed 3 cm above the line of the other trocars.  All trocars were inserted under direct visualization of the camera.  The patient was placed in trendelenburg and then the Robot docked.  The Vessel sealer was placed on arm 2 and the Hot shears on arm 1 and introduced under direct visualization of the camera.  There was omentum densely and widely adhesed to the anterior abdominal wall.  Some areas lower were able to be removed with the Vessel sealer cautery.  I then broke scrub and sat down at the console.  The above findings were noted and the ureters identified well out of the field of dissection. Some areas lower were able to be removed with the Vessel sealer cautery. The right ovary and  tube were isolated and he Utero-ovarian ligament was then divided with the vessel sealer.  The posterior broad ligament was then divided with the hot shears until the uterosacral ligament.  The Broad and cardinal ligaments were then cauterized against the cervix to the level of the Koh ring, securing the uterine artery.  Each pedicle was then incised with the shears.  The anterior leaf was then incised at the reflection  of the vessico-uterine junction and the lateral bladder retracted inferiorly after the round ligament had been divided with the PK forceps.  The left IP ligament was identified still going to uterus and this was divided with the vessel sealer.  The round ligaments were very thick and were divided with multiple bites of the vessel sealer; the posterior leaf of the broad ligament then divided with the hot shears. The broad and cardinal ligaments were then cauterized on the left in the same way.   At the level of the  internal os, the uterine arteries were bilaterally cauterized with the PK.  The ureters were identified well out of the field of dissection.  .   The bladder was then able to be retracted inferiorly and the vesico-uterine fascia was incised in the midline until the bladder was removed one cm below the Koh ring.  The hot shears then circumferentially incised the vagina at the level of the reflection on the [facility] ring.  Once the uterus and cervix were amputated, cautery was used to insure hemostasis of the cuff.  Once hemostasis was achieved, the instruments were changed to the vessel sealer and mega suture cut needle driver and the cuff was closed with a running stitches of 0-vicryl V loc.   Cautery was used to ensure hemostasis of the left pedicles very superficially. The cuff had some oozing that we decided to apply arista to.   The Robot was then undocked and I scrubbed back in.  The needle was removed and Bupivicaine was introduced into the pelvis, the arista sprayed over the cuff and pedicles since they were oozing slightly.  The camera and trocars were removed and the abdomen was desufflated.  The fascia of the 12 mm trocar was closed with two figure of 8 stitches of 0 vicryl.  The skin incisions were closed with subcuticular stitches of 3-0 vicryl Rapide and Dermabond.  All instruments were removed from the vagina and cystoscopy performed, revealing an intact bladder and vigourous spill of urine from each ureteral orifice.  The cystoscope was removed and the patient taken to the recovery room in stable condition.  Lawrencia Mauney A

## 2015-05-24 ENCOUNTER — Ambulatory Visit (HOSPITAL_COMMUNITY)
Admission: RE | Admit: 2015-05-24 | Payer: BLUE CROSS/BLUE SHIELD | Source: Ambulatory Visit | Admitting: Obstetrics and Gynecology

## 2015-05-24 ENCOUNTER — Encounter (HOSPITAL_COMMUNITY): Payer: Self-pay | Admitting: Obstetrics and Gynecology

## 2015-05-24 ENCOUNTER — Ambulatory Visit (HOSPITAL_BASED_OUTPATIENT_CLINIC_OR_DEPARTMENT_OTHER): Payer: BLUE CROSS/BLUE SHIELD

## 2015-05-24 DIAGNOSIS — N950 Postmenopausal bleeding: Secondary | ICD-10-CM | POA: Diagnosis not present

## 2015-05-24 DIAGNOSIS — G8918 Other acute postprocedural pain: Secondary | ICD-10-CM

## 2015-05-24 LAB — CBC
HCT: 28.4 % — ABNORMAL LOW (ref 36.0–46.0)
Hemoglobin: 9.4 g/dL — ABNORMAL LOW (ref 12.0–15.0)
MCH: 32.2 pg (ref 26.0–34.0)
MCHC: 33.1 g/dL (ref 30.0–36.0)
MCV: 97.3 fL (ref 78.0–100.0)
PLATELETS: 206 10*3/uL (ref 150–400)
RBC: 2.92 MIL/uL — ABNORMAL LOW (ref 3.87–5.11)
RDW: 13.9 % (ref 11.5–15.5)
WBC: 9.7 10*3/uL (ref 4.0–10.5)

## 2015-05-24 MED ORDER — OXYCODONE-ACETAMINOPHEN 5-325 MG PO TABS
1.0000 | ORAL_TABLET | ORAL | Status: DC | PRN
Start: 2015-05-24 — End: 2015-08-06

## 2015-05-24 MED ORDER — IBUPROFEN 800 MG PO TABS: 800.0000 mg | ORAL_TABLET | Freq: Three times a day (TID) | ORAL | Status: DC | PRN

## 2015-05-24 NOTE — Progress Notes (Signed)
Discharge teaching complete. Pt understood all instructions and did not have any questions. Pt pushed via wheelchair and discharged home to family. 

## 2015-05-24 NOTE — Progress Notes (Signed)
VASCULAR LAB PRELIMINARY  PRELIMINARY  PRELIMINARY  PRELIMINARY  Right lower extremity venous duplex completed.    Preliminary report:  Right:  No obvious evidence of DVT, superficial thrombosis, or Baker's cyst. Unable to visualize the femoral vein.  Technically limited by body habitus.  Marcine Gadway, RVT 05/24/2015, 11:14 AM

## 2015-05-24 NOTE — Progress Notes (Signed)
Patient is eating, ambulating, and voiding.  Pain control is good.  Although pain is better in back of knee, she feels that the pain has extended up her thigh.  She feels that her L breast is bigger as well.    BP 106/56 mmHg  Pulse 68  Temp(Src) 97.6 F (36.4 C) (Oral)  Resp 20  Ht 5' 4.25" (1.632 m)  Wt 137.893 kg (304 lb)  BMI 51.77 kg/m2  SpO2 99%  LMP 10/02/2007  lungs:   clear to auscultation cor:    RRR Abdomen:  soft, appropriate tenderness, incisions intact and without erythema or exudate. ex:    no cords, increased swelling or erythema.  Behind both knees are no masses or cords- L back of knee is more tender than R knee.  Swelling is worse on L side, but only in foot.    Lab Results  Component Value Date   WBC 5.2 05/17/2015   HGB 12.3 05/17/2015   HCT 37.1 05/17/2015   MCV 95.6 05/17/2015   PLT 219 05/17/2015    A/P  POD #1 robotic TLH, RSO.  Will get CBC and dopplers of L LE to make sure no deep clot.  Expect DC after that if negative.

## 2015-05-24 NOTE — Anesthesia Postprocedure Evaluation (Signed)
Anesthesia Post Note  Patient: Rue Krabbenhoft  Procedure(s) Performed: Procedure(s) (LRB): ROBOTIC ASSISTED TOTAL HYSTERECTOMY WITH SALPINGECTOMYwith right oophorectomy (Bilateral) CYSTOSCOPY (N/A)  Patient location during evaluation: [facility] Anesthesia Type: General Pain management: pain level controlled Vital Signs Assessment: post-procedure vital signs reviewed and stable Cardiovascular status: stable Postop Assessment: adequate PO intake and no signs of nausea or vomiting Anesthetic complications: no    Last Vitals:  Filed Vitals:   05/24/15 0355 05/24/15 0524  BP:  106/56  Pulse: 77 68  Temp:  36.4 C  Resp: 20 20    Last Pain:  Filed Vitals:   05/24/15 0535  PainSc: 4                  Venetta Knee, Velvet Bathe

## 2015-05-24 NOTE — Progress Notes (Signed)
Pt c/o tenderness, tightness in LLE ( area behind the knee, slightly to outside of leg).  Area palpated and tender to touch. Examined closely and slight bruising noted at site.  Circumference obtained - 22 inches.  SCD's removed.   No redness or warmth noted.  Report to Dr. Rogue Bussing (on call tonight) and instructed to call Dr. Philis Pique (who performed the surgery).  Called Dr. Philis Pique - no answer.  Called Dr. Rogue Bussing back and rec'd orders for Doppler scan in AM and Lovenox.  Dr. Philis Pique called back immediately after hanging up with Dr. Rogue Bussing and POC, orders discussed.  Instructed to apply ice to area and reevaluate.  Hold off on Lovenox and will reevaluate in AM for further testing unless change in condition tonight.  Discussed POC of care with patient and family.  Ice applied to LLE.  Understanding and agreement verbalized per patient.

## 2015-05-24 NOTE — Addendum Note (Signed)
Addendum  created 05/24/15 0601 by Genevie Ann, CRNA   Modules edited: Clinical Notes   Clinical Notes:  File: [id number]

## 2015-05-24 NOTE — Discharge Summary (Signed)
Physician Discharge Summary  Patient ID: Hailey Vance MRN: [id number] DOB/AGE: [birth year] 52 y.o.  Admit date: 05/23/2015 Discharge date: 05/24/2015  Admission Diagnoses:  Discharge Diagnoses:  Active Problems:   Postoperative state   Discharged Condition: good  Hospital Course: Uncomplicated Robotic TLH/RSO/Cysto.  Pt c/o posterior knee pain, initially thought to be a bruise but pod 1 no bruise seen.  Pain then moved to thigh.  Dopplers done and were negative.  Consults: None  Significant Diagnostic Studies: labs:    Treatments: surgery: Robotic TLH/RSO/cysto  Discharge Exam: Blood pressure 106/56, pulse 68, temperature 97.6 F (36.4 C), temperature source Oral, resp. rate 20, height 5' 4.25" (1.632 m), weight 137.893 kg (304 lb), last menstrual period 10/02/2007, SpO2 99 %.   Disposition: 01-Home or Self Care  Discharge Instructions    Call MD for:  temperature >100.4    Complete by:  As directed      Diet - low sodium heart healthy    Complete by:  As directed      Discharge instructions    Complete by:  As directed   No driving on narcotics, no sexual activity for 2 weeks.     Increase activity slowly    Complete by:  As directed      May shower / Bathe    Complete by:  As directed   Shower, no bath for 2 weeks.     Remove dressing in 24 hours    Complete by:  As directed      Sexual Activity Restrictions    Complete by:  As directed   No sexual activity for 2 weeks.            Medication List    TAKE these medications        albuterol 108 (90 Base) MCG/ACT inhaler  Commonly known as:  PROVENTIL HFA;VENTOLIN HFA  Inhale 1-2 puffs into the lungs every 6 (six) hours as needed for wheezing or shortness of breath.     aspirin-acetaminophen-caffeine 250-250-65 MG tablet  Commonly known as:  EXCEDRIN MIGRAINE  Take 2 tablets by mouth every 6 (six) hours as needed for headache or migraine.     FLUoxetine 20 MG capsule  Commonly known as:  PROZAC  Take 20  mg by mouth daily.     HYDROcodone-acetaminophen 5-325 MG tablet  Commonly known as:  NORCO  Take 1 tablet by mouth every 6 (six) hours as needed for severe pain.     ibuprofen 600 MG tablet  Commonly known as:  ADVIL,MOTRIN  Take 1 tablet (600 mg total) by mouth every 6 (six) hours as needed for moderate pain or cramping.     ibuprofen 800 MG tablet  Commonly known as:  ADVIL,MOTRIN  Take 1 tablet (800 mg total) by mouth every 8 (eight) hours as needed (mild pain).     lisinopril-hydrochlorothiazide 20-25 MG tablet  Commonly known as:  PRINZIDE,ZESTORETIC  Take 0.5 tablets by mouth daily.     meloxicam 7.5 MG tablet  Commonly known as:  MOBIC  Take 1 tablet (7.5 mg total) by mouth daily.     nystatin 100000 UNIT/GM Powd  Apply twice daily to affected area as needed     oxyCODONE-acetaminophen 5-325 MG tablet  Commonly known as:  PERCOCET/ROXICET  Take 1-2 tablets by mouth every 4 (four) hours as needed for severe pain (moderate to severe pain (when tolerating fluids)).     potassium chloride SA 20 MEQ tablet  Commonly known as:  K-DUR,KLOR-CON  Take 1 tablet (20 mEq total) by mouth daily.     Vitamin D (Ergocalciferol) 50000 units Caps capsule  Commonly known as:  DRISDOL  Take 2 capsules by mouth weekly for 12 weeks.           Follow-up Information    Follow up with Kalvin Buss A, MD In 2 weeks.   Specialty:  Obstetrics and Gynecology   Contact information:   [place] [address] [phone]       Signed: Verneda Hollopeter A 05/24/2015, 8:18 AM

## 2015-06-14 ENCOUNTER — Encounter: Payer: BLUE CROSS/BLUE SHIELD | Admitting: Gastroenterology

## 2015-06-29 ENCOUNTER — Ambulatory Visit (INDEPENDENT_AMBULATORY_CARE_PROVIDER_SITE_OTHER): Payer: BLUE CROSS/BLUE SHIELD | Admitting: Gastroenterology

## 2015-06-29 ENCOUNTER — Encounter: Payer: Self-pay | Admitting: Gastroenterology

## 2015-06-29 VITALS — BP 100/72 | HR 77 | Ht 64.0 in | Wt 296.0 lb

## 2015-06-29 DIAGNOSIS — Z1211 Encounter for screening for malignant neoplasm of colon: Secondary | ICD-10-CM

## 2015-06-29 DIAGNOSIS — Z6841 Body Mass Index (BMI) 40.0 and over, adult: Secondary | ICD-10-CM | POA: Diagnosis not present

## 2015-06-29 NOTE — Patient Instructions (Signed)
You have been scheduled for your colonoscopy on 08/30/2015 at 11:30am at [facility] instructions given

## 2015-07-05 NOTE — Progress Notes (Signed)
Towanna Koelbl    [id number]    1965/06/20  Primary Care Physician:O'SULLIVAN,[name] S., NP  Referring Physician: Debbrah Alar, NP [address] [place], [place] [id number]  Chief complaint:  Screening colonoscopy  HPI: 50 year old female with history of obesity, hypertension, obstructive sleep apnea here to discuss screening colonoscopy. She is about the cut off for BMI to schedule at [facility]. Denies any nausea, vomiting, abdominal pain, melena or bright red blood per rectum. She feels well overall and denies any change in bowel habits    Outpatient Encounter Prescriptions as of 06/29/2015  Medication Sig  . aspirin-acetaminophen-caffeine (EXCEDRIN MIGRAINE) 250-250-65 MG per tablet Take 2 tablets by mouth every 6 (six) hours as needed for headache or migraine.  Marland Kitchen FLUoxetine (PROZAC) 20 MG capsule Take 20 mg by mouth daily.  Marland Kitchen ibuprofen (ADVIL,MOTRIN) 600 MG tablet Take 1 tablet (600 mg total) by mouth every 6 (six) hours as needed for moderate pain or cramping.  Marland Kitchen ibuprofen (ADVIL,MOTRIN) 800 MG tablet Take 1 tablet (800 mg total) by mouth every 8 (eight) hours as needed (mild pain).  Marland Kitchen lisinopril-hydrochlorothiazide (PRINZIDE,ZESTORETIC) 20-25 MG tablet Take 0.5 tablets by mouth daily.  . meloxicam (MOBIC) 7.5 MG tablet Take 1 tablet (7.5 mg total) by mouth daily.  Marland Kitchen nystatin (MYCOSTATIN/NYSTOP) 100000 UNIT/GM POWD Apply twice daily to affected area as needed  . oxyCODONE-acetaminophen (PERCOCET/ROXICET) 5-325 MG tablet Take 1-2 tablets by mouth every 4 (four) hours as needed for severe pain (moderate to severe pain (when tolerating fluids)).  Marland Kitchen potassium chloride SA (K-DUR,KLOR-CON) 20 MEQ tablet Take 1 tablet (20 mEq total) by mouth daily.  . Vitamin D, Ergocalciferol, (DRISDOL) 50000 units CAPS capsule Take 2 capsules by mouth weekly for 12 weeks.  . [DISCONTINUED] albuterol (PROVENTIL HFA;VENTOLIN HFA) 108 (90 BASE) MCG/ACT inhaler Inhale 1-2 puffs into  the lungs every 6 (six) hours as needed for wheezing or shortness of breath.  . [DISCONTINUED] HYDROcodone-acetaminophen (NORCO) 5-325 MG tablet Take 1 tablet by mouth every 6 (six) hours as needed for severe pain. (Patient not taking: Reported on 05/17/2015)   No facility-administered encounter medications on file as of 06/29/2015.    Allergies as of 06/29/2015 - Review Complete 06/29/2015  Allergen Reaction Noted  . Penicillins Itching 04/27/2011  . Venlafaxine Other (See Comments) 05/02/2015    Past Medical History  Diagnosis Date  . Hypertension   . Sleep apnea     CPAP  . Blood transfusion 1990    In [place], [place] with C/S Surgery - 5 units transfused  . Headache(784.0)     otc med prn  . Plantar fasciitis of left foot   . Arthritis     osteoarthritis - knees  . Chronic back pain   . Blood transfusion without reported diagnosis 1990    after a c/section  . Anxiety   . Depression   . Fibroid   . Vitamin D deficiency 09/06/2014  . Chest pain   . Low blood potassium     Past Surgical History  Procedure Laterality Date  . Cesarean section  1990    x 1  . Hernia repair    . Bilateral oophorectomy      Complete removal of left ovary and partial removal of right ovary.  . Knee surgery      right knee  . Tubal ligation  1990  . Hysteroscopy w/d&c N/A 01/11/2015    Procedure: DILATATION AND CURETTAGE /HYSTEROSCOPY;  Surgeon: Jerelyn Charles, MD;  Location: [facility];  Service: Gynecology;  Laterality: N/A;  . Robotic assisted total hysterectomy with salpingectomy Bilateral 05/23/2015    Procedure: ROBOTIC ASSISTED TOTAL HYSTERECTOMY WITH SALPINGECTOMYwith right oophorectomy;  Surgeon: Bobbye Charleston, MD;  Location: [facility];  Service: Gynecology;  Laterality: Bilateral;  . Cystoscopy N/A 05/23/2015    Procedure: CYSTOSCOPY;  Surgeon: Bobbye Charleston, MD;  Location: [facility];  Service: Gynecology;  Laterality: N/A;    Family History  Problem Relation Age of Onset  . Lung cancer  Mother 30    dec  . Cancer Mother   . Hypertension Mother   . Arthritis Mother   . Cancer Father 77    prostate  . Hypertension Father   . Leukemia Sister   . Hypertension Sister   . Arthritis Sister   . Hyperlipidemia Sister   . Hypertension Brother   . Arthritis Brother   . Breast cancer Paternal Aunt   . Diabetes Maternal Grandmother   . Stroke Maternal Grandmother   . Heart attack Paternal Grandmother   . Multiple sclerosis Sister   . Hypertension Sister   . Hypertension Sister   . Hypertension Brother   . Hypertension Brother     Social History   Social History  . Marital Status: Divorced    Spouse Name: N/A  . Number of Children: N/A  . Years of Education: N/A   Occupational History  . Not on file.   Social History Main Topics  . Smoking status: Never Smoker   . Smokeless tobacco: Never Used  . Alcohol Use: No  . Drug Use: No  . Sexual Activity:    Partners: Male    Birth Control/ Protection: Surgical     Comment: Tubal   Other Topics Concern  . Not on file   Social History Narrative   Divorced   Lives with son and grand-daughter   [place] (lives in [place])   [place] lives with patient   58- Daughter [name] (lives in [place])   Works at [employer]   Completed some college   Careers adviser         Review of systems: Review of Systems  Constitutional: Negative for fever and chills.  HENT: Negative.   Eyes: Negative for blurred vision.  Respiratory: Negative for cough, shortness of breath and wheezing.   Cardiovascular: Negative for chest pain and palpitations.  Gastrointestinal: as per HPI Genitourinary: Negative for dysuria, urgency, frequency and hematuria.  Musculoskeletal: Negative for myalgias, back pain and joint pain.  Skin: Negative for itching and rash.  Neurological: Negative for dizziness, tremors, focal weakness, seizures and loss of consciousness.  Endo/Heme/Allergies: Negative  for environmental allergies.  Psychiatric/Behavioral: Negative for depression, suicidal ideas and hallucinations.  All other systems reviewed and are negative.   Physical Exam: Filed Vitals:   06/29/15 0855  BP: 100/72  Pulse: 77   Gen:      No acute distress, obese HEENT:  EOMI, sclera anicteric Neck:     No masses; no thyromegaly Lungs:    Clear to auscultation bilaterally; normal respiratory effort CV:         Regular rate and rhythm; no murmurs Abd:      + bowel sounds; soft, non-tender; no palpable masses, no distension Ext:    No edema; adequate peripheral perfusion Skin:      Warm and dry; no rash Neuro: alert and oriented x 3 Psych: normal mood and affect  Data Reviewed:  Reviewed chart in epic   Assessment and Plan/Recommendations:  50 year old female with morbid obesity and obstructive sleep apnea here to discuss screening colonoscopy We'll schedule for colonoscopy at hospital given BMI over 50 Discussed diet and excised for weight loss The risks and benefits as well as alternatives of endoscopic procedure(s) have been discussed and reviewed. All questions answered. The patient agrees to proceed. Return as needed  K. Denzil Magnuson , MD [phone] Mon-Fri 8a-5p [phone] after 5p, weekends, holidays

## 2015-08-02 ENCOUNTER — Other Ambulatory Visit (INDEPENDENT_AMBULATORY_CARE_PROVIDER_SITE_OTHER): Payer: BLUE CROSS/BLUE SHIELD

## 2015-08-02 DIAGNOSIS — E559 Vitamin D deficiency, unspecified: Secondary | ICD-10-CM

## 2015-08-02 LAB — VITAMIN D 25 HYDROXY (VIT D DEFICIENCY, FRACTURES): VITD: 31.48 ng/mL (ref 30.00–100.00)

## 2015-08-03 ENCOUNTER — Other Ambulatory Visit: Payer: Self-pay | Admitting: Family

## 2015-08-03 ENCOUNTER — Encounter: Payer: Self-pay | Admitting: Family

## 2015-08-03 MED ORDER — VITAMIN D3 75 MCG (3000 UT) PO TABS: 1.0000 | ORAL_TABLET | Freq: Every day | ORAL | Status: DC

## 2015-08-06 ENCOUNTER — Telehealth: Payer: Self-pay | Admitting: Family

## 2015-08-06 ENCOUNTER — Telehealth: Payer: Self-pay | Admitting: Gastroenterology

## 2015-08-06 ENCOUNTER — Ambulatory Visit: Payer: BLUE CROSS/BLUE SHIELD | Admitting: Family

## 2015-08-06 ENCOUNTER — Other Ambulatory Visit: Payer: Self-pay

## 2015-08-06 ENCOUNTER — Encounter: Payer: Self-pay | Admitting: Family

## 2015-08-06 ENCOUNTER — Ambulatory Visit (INDEPENDENT_AMBULATORY_CARE_PROVIDER_SITE_OTHER): Payer: BLUE CROSS/BLUE SHIELD | Admitting: Family

## 2015-08-06 VITALS — BP 131/87 | HR 66 | Temp 98.4°F | Resp 16 | Ht 64.25 in | Wt 305.4 lb

## 2015-08-06 DIAGNOSIS — M25551 Pain in right hip: Secondary | ICD-10-CM | POA: Diagnosis not present

## 2015-08-06 MED ORDER — MELOXICAM 7.5 MG PO TABS: 7.5000 mg | ORAL_TABLET | Freq: Every day | ORAL | Status: DC

## 2015-08-06 MED ORDER — NA SULFATE-K SULFATE-MG SULF 17.5-3.13-1.6 GM/177ML PO SOLN: ORAL | Status: DC

## 2015-08-06 NOTE — Progress Notes (Signed)
Pre visit review using our clinic review tool, if applicable. No additional management support is needed unless otherwise documented below in the visit note. 

## 2015-08-06 NOTE — Progress Notes (Signed)
Subjective:    Patient ID: Hailey Vance, female    DOB: September 07, 1965, 50 y.o.   MRN: [id number]  HPI  Hailey Vance is a 50 yr old female who presents today with chief complaint of hip pain.  Pain is located in the right hip and radiates down the right anterior leg. Has been present since the end of April. Pt fell into her bottom.  Then developed "locking" of the right hip about 1 week after the fall.  Reports + pain with walking and movement.   Review of Systems See HPI  Past Medical History  Diagnosis Date  . Hypertension   . Sleep apnea     CPAP  . Blood transfusion 1990    In [place], [place] with C/S Surgery - 5 units transfused  . Headache(784.0)     otc med prn  . Plantar fasciitis of left foot   . Arthritis     osteoarthritis - knees  . Chronic back pain   . Blood transfusion without reported diagnosis 1990    after a c/section  . Anxiety   . Depression   . Fibroid   . Vitamin D deficiency 09/06/2014  . Chest pain   . Low blood potassium      Social History   Social History  . Marital Status: Divorced    Spouse Name: N/A  . Number of Children: N/A  . Years of Education: N/A   Occupational History  . Not on file.   Social History Main Topics  . Smoking status: Never Smoker   . Smokeless tobacco: Never Used  . Alcohol Use: No  . Drug Use: No  . Sexual Activity:    Partners: Male    Birth Control/ Protection: Surgical     Comment: Tubal   Other Topics Concern  . Not on file   Social History Narrative   Divorced   Lives with son and grand-daughter   [place] (lives in [place])   [place] lives with patient   7- Daughter [name] (lives in [place])   Works at [employer]   Completed some college   Careers adviser       Past Surgical History  Procedure Laterality Date  . Cesarean section  1990    x 1  . Hernia repair    . Bilateral oophorectomy      Complete removal of left ovary and partial removal of right  ovary.  . Knee surgery      right knee  . Tubal ligation  1990  . Hysteroscopy w/d&c N/A 01/11/2015    Procedure: DILATATION AND CURETTAGE /HYSTEROSCOPY;  Surgeon: Jerelyn Charles, MD;  Location: [facility];  Service: Gynecology;  Laterality: N/A;  . Robotic assisted total hysterectomy with salpingectomy Bilateral 05/23/2015    Procedure: ROBOTIC ASSISTED TOTAL HYSTERECTOMY WITH SALPINGECTOMYwith right oophorectomy;  Surgeon: Bobbye Charleston, MD;  Location: [facility];  Service: Gynecology;  Laterality: Bilateral;  . Cystoscopy N/A 05/23/2015    Procedure: CYSTOSCOPY;  Surgeon: Bobbye Charleston, MD;  Location: [facility];  Service: Gynecology;  Laterality: N/A;    Family History  Problem Relation Age of Onset  . Lung cancer Mother 14    dec  . Cancer Mother   . Hypertension Mother   . Arthritis Mother   . Cancer Father 42    prostate  . Hypertension Father   . Leukemia Sister   . Hypertension Sister   . Arthritis Sister   . Hyperlipidemia  Sister   . Hypertension Brother   . Arthritis Brother   . Breast cancer Paternal Aunt   . Diabetes Maternal Grandmother   . Stroke Maternal Grandmother   . Heart attack Paternal Grandmother   . Multiple sclerosis Sister   . Hypertension Sister   . Hypertension Sister   . Hypertension Brother   . Hypertension Brother     Allergies  Allergen Reactions  . Penicillins Itching    Has patient had a PCN reaction causing immediate rash, facial/tongue/throat swelling, SOB or lightheadedness with hypotension: No Has patient had a PCN reaction causing severe rash involving mucus membranes or skin necrosis: No Has patient had a PCN reaction that required hospitalization No Has patient had a PCN reaction occurring within the last 10 years: No If all of the above answers are "NO", then may proceed with Cephalosporin use.   . Venlafaxine Other (See Comments)    "felt spacey"    Current Outpatient Prescriptions on File Prior to Visit  Medication Sig Dispense  Refill  . aspirin-acetaminophen-caffeine (EXCEDRIN MIGRAINE) 250-250-65 MG per tablet Take 2 tablets by mouth every 6 (six) hours as needed for headache or migraine.    . Cholecalciferol (VITAMIN D3) 3000 units TABS Take 1 tablet by mouth daily. 30 tablet   . FLUoxetine (PROZAC) 20 MG capsule Take 20 mg by mouth daily.    Marland Kitchen ibuprofen (ADVIL,MOTRIN) 600 MG tablet Take 1 tablet (600 mg total) by mouth every 6 (six) hours as needed for moderate pain or cramping. 30 tablet 0  . ibuprofen (ADVIL,MOTRIN) 800 MG tablet Take 1 tablet (800 mg total) by mouth every 8 (eight) hours as needed (mild pain). 30 tablet 0  . lisinopril-hydrochlorothiazide (PRINZIDE,ZESTORETIC) 20-25 MG tablet Take 0.5 tablets by mouth daily. 45 tablet 1  . meloxicam (MOBIC) 7.5 MG tablet Take 1 tablet (7.5 mg total) by mouth daily. 30 tablet 2  . nystatin (MYCOSTATIN/NYSTOP) 100000 UNIT/GM POWD Apply twice daily to affected area as needed 30 g 1  . potassium chloride SA (K-DUR,KLOR-CON) 20 MEQ tablet Take 1 tablet (20 mEq total) by mouth daily. 30 tablet 3  . [DISCONTINUED] LISINOPRIL PO Take by mouth.     No current facility-administered medications on file prior to visit.    BP 131/87 mmHg  Pulse 66  Temp(Src) 98.4 F (36.9 C) (Oral)  Resp 16  Ht 5' 4.25" (1.632 m)  Wt 305 lb 6.4 oz (138.529 kg)  BMI 52.01 kg/m2  SpO2 100%  LMP 10/02/2007       Objective:   Physical Exam  Constitutional: She is oriented to person, place, and time. She appears well-developed and well-nourished. No distress.  Musculoskeletal:  Full ROM right hip, + pain with hip extension  Neurological: She is alert and oriented to person, place, and time.  Psychiatric: She has a normal mood and affect. Her behavior is normal. Judgment and thought content normal.          Assessment & Plan:  R Hip pain- rx with meloxicam, refer to sports medicine for further evaluation.

## 2015-08-06 NOTE — Patient Instructions (Signed)
Please stop ibuprofen, you may use meloxicam once daily as needed.  You will be contacted about your referral to Sports medicine for further evaluation of your hip pain.

## 2015-08-06 NOTE — Telephone Encounter (Signed)
No charge. 

## 2015-08-06 NOTE — Telephone Encounter (Signed)
Pt called in stating she cannot be here for 9:45am appt. Her grandchild has fever and having to take her to doctor this morning. She is coming in later today. Charge or no charge?

## 2015-08-06 NOTE — Telephone Encounter (Signed)
Rx sent per her request.

## 2015-08-08 ENCOUNTER — Ambulatory Visit (HOSPITAL_BASED_OUTPATIENT_CLINIC_OR_DEPARTMENT_OTHER)
Admission: RE | Admit: 2015-08-08 | Discharge: 2015-08-08 | Disposition: A | Discharge status: Home / Self Care | Payer: BLUE CROSS/BLUE SHIELD | Source: Ambulatory Visit | Attending: Family Medicine | Admitting: Family Medicine

## 2015-08-08 ENCOUNTER — Encounter: Payer: Self-pay | Admitting: Family Medicine

## 2015-08-08 ENCOUNTER — Ambulatory Visit (INDEPENDENT_AMBULATORY_CARE_PROVIDER_SITE_OTHER): Payer: BLUE CROSS/BLUE SHIELD | Admitting: Family Medicine

## 2015-08-08 VITALS — BP 131/86 | HR 89 | Ht 64.0 in | Wt 304.0 lb

## 2015-08-08 DIAGNOSIS — M25551 Pain in right hip: Secondary | ICD-10-CM | POA: Insufficient documentation

## 2015-08-08 NOTE — Patient Instructions (Signed)
I'm concerned you have a labral tear in this hip in addition to hip flexor strain. Most people respond to conservative treatment for this. Get x-rays downstairs as you leave today - we will contact you with these results. We will set up an injection as well assuming the x-rays look good. Continue with your mobic - we could try diclofenac instead in the future. Start physical therapy for 2-3 total visits - do exercises they show you every day. Follow up with me in 4 weeks but can call me sooner if you're struggling.

## 2015-08-13 DIAGNOSIS — M25551 Pain in right hip: Secondary | ICD-10-CM | POA: Insufficient documentation

## 2015-08-13 NOTE — Assessment & Plan Note (Signed)
independently reviewed radiographs - very mild arthritis - no evidence impingement.  I'm concerned she has a labral tear in this hip.  Start with physical therapy, continue mobic.  Given level of pain she would like to try an intraarticular cortisone injection - will arrange this.  F/u in 4 weeks.

## 2015-08-13 NOTE — Progress Notes (Signed)
PCP and consultation requested by: Nance Pear., NP  Subjective:   HPI: Patient is a 50 y.o. female here for right hip pain.  Patient reports she's had pain in right hip since late April when she slipped and fell down onto her buttocks. No swelling, bruising. Had some soreness then. Now having pain when turning over in bed, pain more anterior. Pain level is 5/10, sharp. Worse driving also. Taking meloxicam which helps. No skin changes, numbness. No back pain.  Past Medical History  Diagnosis Date  . Hypertension   . Sleep apnea     CPAP  . Blood transfusion 1990    In [place], [place] with C/S Surgery - 5 units transfused  . Headache(784.0)     otc med prn  . Plantar fasciitis of left foot   . Arthritis     osteoarthritis - knees  . Chronic back pain   . Blood transfusion without reported diagnosis 1990    after a c/section  . Anxiety   . Depression   . Fibroid   . Vitamin D deficiency 09/06/2014  . Chest pain   . Low blood potassium     Current Outpatient Prescriptions on File Prior to Visit  Medication Sig Dispense Refill  . aspirin-acetaminophen-caffeine (EXCEDRIN MIGRAINE) 250-250-65 MG per tablet Take 2 tablets by mouth every 6 (six) hours as needed for headache or migraine.    . Cholecalciferol (VITAMIN D3) 3000 units TABS Take 1 tablet by mouth daily. 30 tablet   . FLUoxetine (PROZAC) 20 MG capsule Take 20 mg by mouth daily.    Marland Kitchen lisinopril-hydrochlorothiazide (PRINZIDE,ZESTORETIC) 20-25 MG tablet Take 0.5 tablets by mouth daily. 45 tablet 1  . meloxicam (MOBIC) 7.5 MG tablet Take 1 tablet (7.5 mg total) by mouth daily. 30 tablet 2  . Na Sulfate-K Sulfate-Mg Sulf 17.5-3.13-1.6 GM/180ML SOLN Use once per instructions 354 mL 0  . nystatin (MYCOSTATIN/NYSTOP) 100000 UNIT/GM POWD Apply twice daily to affected area as needed 30 g 1  . potassium chloride SA (K-DUR,KLOR-CON) 20 MEQ tablet Take 1 tablet (20 mEq total) by mouth daily. 30 tablet 3  . [DISCONTINUED]  LISINOPRIL PO Take by mouth.     No current facility-administered medications on file prior to visit.    Past Surgical History  Procedure Laterality Date  . Cesarean section  1990    x 1  . Hernia repair    . Bilateral oophorectomy      Complete removal of left ovary and partial removal of right ovary.  . Knee surgery      right knee  . Tubal ligation  1990  . Hysteroscopy w/d&c N/A 01/11/2015    Procedure: DILATATION AND CURETTAGE /HYSTEROSCOPY;  Surgeon: Jerelyn Charles, MD;  Location: [facility];  Service: Gynecology;  Laterality: N/A;  . Robotic assisted total hysterectomy with salpingectomy Bilateral 05/23/2015    Procedure: ROBOTIC ASSISTED TOTAL HYSTERECTOMY WITH SALPINGECTOMYwith right oophorectomy;  Surgeon: Bobbye Charleston, MD;  Location: [facility];  Service: Gynecology;  Laterality: Bilateral;  . Cystoscopy N/A 05/23/2015    Procedure: CYSTOSCOPY;  Surgeon: Bobbye Charleston, MD;  Location: [facility];  Service: Gynecology;  Laterality: N/A;    Allergies  Allergen Reactions  . Penicillins Itching    Has patient had a PCN reaction causing immediate rash, facial/tongue/throat swelling, SOB or lightheadedness with hypotension: No Has patient had a PCN reaction causing severe rash involving mucus membranes or skin necrosis: No Has patient had a PCN reaction that required hospitalization No Has patient had a PCN  reaction occurring within the last 10 years: No If all of the above answers are "NO", then may proceed with Cephalosporin use.   . Venlafaxine Other (See Comments)    "felt spacey"    Social History   Social History  . Marital Status: Divorced    Spouse Name: N/A  . Number of Children: N/A  . Years of Education: N/A   Occupational History  . Not on file.   Social History Main Topics  . Smoking status: Never Smoker   . Smokeless tobacco: Never Used  . Alcohol Use: No  . Drug Use: No  . Sexual Activity:    Partners: Male    Birth Control/ Protection: Surgical      Comment: Tubal   Other Topics Concern  . Not on file   Social History Narrative   Divorced   Lives with son and grand-daughter   [place] (lives in [place])   [place] lives with patient   59- Daughter [name] (lives in [place])   Works at [employer] center   Completed some college   Careers adviser       Family History  Problem Relation Age of Onset  . Lung cancer Mother 50    dec  . Cancer Mother   . Hypertension Mother   . Arthritis Mother   . Cancer Father 27    prostate  . Hypertension Father   . Leukemia Sister   . Hypertension Sister   . Arthritis Sister   . Hyperlipidemia Sister   . Hypertension Brother   . Arthritis Brother   . Breast cancer Paternal Aunt   . Diabetes Maternal Grandmother   . Stroke Maternal Grandmother   . Heart attack Paternal Grandmother   . Multiple sclerosis Sister   . Hypertension Sister   . Hypertension Sister   . Hypertension Brother   . Hypertension Brother     BP 131/86 mmHg  Pulse 89  Ht 5\' 4"  (1.626 m)  Wt 304 lb (137.893 kg)  BMI 52.16 kg/m2  LMP 10/02/2007  Review of Systems: See HPI above.    Objective:  Physical Exam:  Gen: NAD, comfortable in exam room  Back/Right hip: No gross deformity, scoliosis. Mild TTP ant hip.  No back, other tenderness.  No midline or bony TTP. FROM with pain on internal and external rotation of hip. Strength LEs 5/5 all muscle groups.   2+ MSRs in patellar and achilles tendons, equal bilaterally. Negative SLRs. Sensation intact to light touch bilaterally. Positive right hip logroll. Negative fabers and piriformis stretches.    Assessment & Plan:  1. Right hip pain - independently reviewed radiographs - very mild arthritis - no evidence impingement.  I'm concerned she has a labral tear in this hip.  Start with physical therapy, continue mobic.  Given level of pain she would like to try an intraarticular cortisone injection - will arrange this.   F/u in 4 weeks.

## 2015-08-14 ENCOUNTER — Other Ambulatory Visit: Payer: Self-pay | Admitting: Family Medicine

## 2015-08-14 DIAGNOSIS — M25551 Pain in right hip: Secondary | ICD-10-CM

## 2015-08-21 ENCOUNTER — Telehealth: Payer: Self-pay | Admitting: Gastroenterology

## 2015-08-21 NOTE — Telephone Encounter (Signed)
Hailey Vance, You said send these to your right? Patient needing a suprep

## 2015-08-22 NOTE — Telephone Encounter (Signed)
Voicemail for home and mobile full.  Could not get through work number.  I am leaving a sample Suprep up front for patient to pick up.

## 2015-08-29 NOTE — Progress Notes (Signed)
Unable to reach pt by phone. Left pre-op instructions on voicemail  Pt has hx of Sleep Apnea with Cpap use. EKG - 12/20/14 - in EPIC Stress - 09/19/14 - in EPIC

## 2015-08-30 ENCOUNTER — Ambulatory Visit (HOSPITAL_COMMUNITY): Payer: BLUE CROSS/BLUE SHIELD | Admitting: Anesthesiology

## 2015-08-30 ENCOUNTER — Ambulatory Visit (HOSPITAL_COMMUNITY)
Admission: RE | Admit: 2015-08-30 | Discharge: 2015-08-30 | Disposition: A | Discharge status: Home / Self Care | Payer: BLUE CROSS/BLUE SHIELD | Source: Ambulatory Visit | Attending: Gastroenterology | Admitting: Gastroenterology

## 2015-08-30 ENCOUNTER — Encounter (HOSPITAL_COMMUNITY): Payer: Self-pay | Admitting: *Deleted

## 2015-08-30 ENCOUNTER — Ambulatory Visit
Admission: RE | Admit: 2015-08-30 | Discharge: 2015-08-30 | Disposition: A | Discharge status: Home / Self Care | Payer: BLUE CROSS/BLUE SHIELD | Source: Ambulatory Visit | Attending: Family Medicine | Admitting: Family Medicine

## 2015-08-30 ENCOUNTER — Encounter (HOSPITAL_COMMUNITY)
Admission: RE | Disposition: A | Discharge status: Home / Self Care | Payer: Self-pay | Source: Ambulatory Visit | Attending: Gastroenterology

## 2015-08-30 DIAGNOSIS — M25551 Pain in right hip: Secondary | ICD-10-CM

## 2015-08-30 DIAGNOSIS — G4730 Sleep apnea, unspecified: Secondary | ICD-10-CM | POA: Diagnosis not present

## 2015-08-30 DIAGNOSIS — E559 Vitamin D deficiency, unspecified: Secondary | ICD-10-CM | POA: Diagnosis not present

## 2015-08-30 DIAGNOSIS — F329 Major depressive disorder, single episode, unspecified: Secondary | ICD-10-CM | POA: Diagnosis not present

## 2015-08-30 DIAGNOSIS — Z1211 Encounter for screening for malignant neoplasm of colon: Secondary | ICD-10-CM | POA: Insufficient documentation

## 2015-08-30 DIAGNOSIS — Z6841 Body Mass Index (BMI) 40.0 and over, adult: Secondary | ICD-10-CM | POA: Insufficient documentation

## 2015-08-30 DIAGNOSIS — I10 Essential (primary) hypertension: Secondary | ICD-10-CM | POA: Diagnosis not present

## 2015-08-30 DIAGNOSIS — Z791 Long term (current) use of non-steroidal anti-inflammatories (NSAID): Secondary | ICD-10-CM | POA: Diagnosis not present

## 2015-08-30 DIAGNOSIS — Z79899 Other long term (current) drug therapy: Secondary | ICD-10-CM | POA: Insufficient documentation

## 2015-08-30 DIAGNOSIS — E66813 Obesity, class 3: Secondary | ICD-10-CM | POA: Insufficient documentation

## 2015-08-30 HISTORY — PX: COLONOSCOPY WITH PROPOFOL: SHX5780

## 2015-08-30 SURGERY — COLONOSCOPY WITH PROPOFOL
Anesthesia: Monitor Anesthesia Care

## 2015-08-30 MED ORDER — PROPOFOL 500 MG/50ML IV EMUL
INTRAVENOUS | Status: DC | PRN
  Administered 2015-08-30: 75 ug/kg/min via INTRAVENOUS

## 2015-08-30 MED ORDER — PROPOFOL 10 MG/ML IV BOLUS
INTRAVENOUS | Status: DC | PRN
  Administered 2015-08-30 (×3): 20 mg via INTRAVENOUS

## 2015-08-30 MED ORDER — IOPAMIDOL (ISOVUE-M 200) INJECTION 41%
1.0000 mL | Freq: Once | INTRAMUSCULAR | Status: AC
  Administered 2015-08-30: 1 mL via INTRA_ARTICULAR

## 2015-08-30 MED ORDER — LACTATED RINGERS IV SOLN
INTRAVENOUS | Status: DC
  Administered 2015-08-30: 1000 mL via INTRAVENOUS
  Administered 2015-08-30: 11:00:00 via INTRAVENOUS

## 2015-08-30 MED ORDER — METHYLPREDNISOLONE ACETATE 40 MG/ML INJ SUSP (RADIOLOG
120.0000 mg | Freq: Once | INTRAMUSCULAR | Status: AC
  Administered 2015-08-30: 120 mg via INTRA_ARTICULAR

## 2015-08-30 MED ORDER — LIDOCAINE HCL (CARDIAC) 20 MG/ML IV SOLN
INTRAVENOUS | Status: DC | PRN
  Administered 2015-08-30: 100 mg via INTRATRACHEAL

## 2015-08-30 MED ORDER — SODIUM CHLORIDE 0.9 % IV SOLN: INTRAVENOUS | Status: DC

## 2015-08-30 NOTE — Discharge Instructions (Signed)
Colonoscopy, Care After °Refer to this sheet in the next few weeks. These instructions provide you with information on caring for yourself after your procedure. Your health care provider may also give you more specific instructions. Your treatment has been planned according to current medical practices, but problems sometimes occur. Call your health care provider if you have any problems or questions after your procedure. °WHAT TO EXPECT AFTER THE PROCEDURE  °After your procedure, it is typical to have the following: °· A small amount of blood in your stool. °· Moderate amounts of gas and mild abdominal cramping or bloating. °HOME CARE INSTRUCTIONS °· Do not drive, operate machinery, or sign important documents for 24 hours. °· You may shower and resume your regular physical activities, but move at a slower pace for the first 24 hours. °· Take frequent rest periods for the first 24 hours. °· Walk around or put a warm pack on your abdomen to help reduce abdominal cramping and bloating. °· Drink enough fluids to keep your urine clear or pale yellow. °· You may resume your normal diet as instructed by your health care provider. Avoid heavy or fried foods that are hard to digest. °· Avoid drinking alcohol for 24 hours or as instructed by your health care provider. °· Only take over-the-counter or prescription medicines as directed by your health care provider. °· If a tissue sample (biopsy) was taken during your procedure: °¨ Do not take aspirin or blood thinners for 7 days, or as instructed by your health care provider. °¨ Do not drink alcohol for 7 days, or as instructed by your health care provider. °¨ Eat soft foods for the first 24 hours. °SEEK MEDICAL CARE IF: °You have persistent spotting of blood in your stool 2-3 days after the procedure. °SEEK IMMEDIATE MEDICAL CARE IF: °· You have more than a small spotting of blood in your stool. °· You pass large blood clots in your stool. °· Your abdomen is swollen  (distended). °· You have nausea or vomiting. °· You have a fever. °· You have increasing abdominal pain that is not relieved with medicine. °  °This information is not intended to replace advice given to you by your health care provider. Make sure you discuss any questions you have with your health care provider. °  °Document Released: 10/02/2003 Document Revised: 12/08/2012 Document Reviewed: 10/25/2012 °Elsevier Interactive Patient Education ©2016 [employer] ° °

## 2015-08-30 NOTE — H&P (Signed)
[facility] History and Physical   Primary Care Physician:  Nance Pear., NP   Reason for Procedure:  Screening for colorectal cancer Plan:    Screening colonsocopy     HPI: Hailey Vance is a 50 y.o. female here for screening colonoscopy. Denies any nausea, vomiting, abdominal pain, melena or bright red blood per rectum    Past Medical History  Diagnosis Date  . Hypertension   . Sleep apnea     CPAP  . Blood transfusion 1990    In [place], [place] with C/S Surgery - 5 units transfused  . Headache(784.0)     otc med prn  . Plantar fasciitis of left foot   . Arthritis     osteoarthritis - knees  . Chronic back pain   . Blood transfusion without reported diagnosis 1990    after a c/section  . Anxiety   . Depression   . Fibroid   . Vitamin D deficiency 09/06/2014  . Chest pain   . Low blood potassium     Past Surgical History  Procedure Laterality Date  . Cesarean section  1990    x 1  . Hernia repair    . Bilateral oophorectomy      Complete removal of left ovary and partial removal of right ovary.  . Knee surgery      right knee  . Tubal ligation  1990  . Hysteroscopy w/d&c N/A 01/11/2015    Procedure: DILATATION AND CURETTAGE /HYSTEROSCOPY;  Surgeon: Jerelyn Charles, MD;  Location: [facility];  Service: Gynecology;  Laterality: N/A;  . Robotic assisted total hysterectomy with salpingectomy Bilateral 05/23/2015    Procedure: ROBOTIC ASSISTED TOTAL HYSTERECTOMY WITH SALPINGECTOMYwith right oophorectomy;  Surgeon: Bobbye Charleston, MD;  Location: [facility];  Service: Gynecology;  Laterality: Bilateral;  . Cystoscopy N/A 05/23/2015    Procedure: CYSTOSCOPY;  Surgeon: Bobbye Charleston, MD;  Location: [facility];  Service: Gynecology;  Laterality: N/A;    Prior to Admission medications   Medication Sig Start Date End Date Taking? Authorizing Provider  aspirin-acetaminophen-caffeine (EXCEDRIN MIGRAINE) [phone] MG per tablet Take 2 tablets by mouth every 6 (six)  hours as needed for headache or migraine.   Yes Historical Provider, MD  FLUoxetine (PROZAC) 20 MG capsule Take 20 mg by mouth daily.   Yes Historical Provider, MD  lisinopril-hydrochlorothiazide (PRINZIDE,ZESTORETIC) 20-25 MG tablet Take 0.5 tablets by mouth daily. 05/02/15  Yes Debbrah Alar, NP  meloxicam (MOBIC) 7.5 MG tablet Take 1 tablet (7.5 mg total) by mouth daily. 08/06/15  Yes Debbrah Alar, NP  nystatin (MYCOSTATIN/NYSTOP) 100000 UNIT/GM POWD Apply twice daily to affected area as needed 09/05/14  Yes Debbrah Alar, NP  potassium chloride SA (K-DUR,KLOR-CON) 20 MEQ tablet Take 1 tablet (20 mEq total) by mouth daily. 05/04/15  Yes Debbrah Alar, NP  Cholecalciferol (VITAMIN D3) 3000 units TABS Take 1 tablet by mouth daily. 08/03/15   Debbrah Alar, NP  Na Sulfate-K Sulfate-Mg Sulf 17.5-3.13-1.6 GM/180ML SOLN Use once per instructions 08/06/15   Mauri Pole, MD    Current Facility-Administered Medications  Medication Dose Route Frequency Provider Last Rate Last Dose  . 0.9 %  sodium chloride infusion   Intravenous Continuous Mauri Pole, MD      . lactated ringers infusion   Intravenous Continuous Mauri Pole, MD 10 mL/hr at 08/30/15 1105 1,000 mL at 08/30/15 1105    Allergies as of 06/29/2015 - Review Complete 06/29/2015  Allergen Reaction Noted  . Penicillins Itching 04/27/2011  . Venlafaxine Other (  See Comments) 05/02/2015    Family History  Problem Relation Age of Onset  . Lung cancer Mother 30    dec  . Cancer Mother   . Hypertension Mother   . Arthritis Mother   . Cancer Father 46    prostate  . Hypertension Father   . Leukemia Sister   . Hypertension Sister   . Arthritis Sister   . Hyperlipidemia Sister   . Hypertension Brother   . Arthritis Brother   . Breast cancer Paternal Aunt   . Diabetes Maternal Grandmother   . Stroke Maternal Grandmother   . Heart attack Paternal Grandmother   . Multiple sclerosis Sister   .  Hypertension Sister   . Hypertension Sister   . Hypertension Brother   . Hypertension Brother     Social History   Social History  . Marital Status: Divorced    Spouse Name: N/A  . Number of Children: N/A  . Years of Education: N/A   Occupational History  . Not on file.   Social History Main Topics  . Smoking status: Never Smoker   . Smokeless tobacco: Never Used  . Alcohol Use: No  . Drug Use: No  . Sexual Activity:    Partners: Male    Birth Control/ Protection: Surgical     Comment: Tubal   Other Topics Concern  . Not on file   Social History Narrative   Divorced   Lives with son and grand-daughter   [place] (lives in [place])   [place] lives with patient   55- Daughter [name] (lives in [place])   Works at [employer]   Completed some college   Careers adviser       Review of Systems:  All other review of systems negative except as mentioned in the HPI.  Physical Exam: Vital signs in last 24 hours: Temp:  [98 F (36.7 C)] 98 F (36.7 C) (06/29 1044) Pulse Rate:  [74] 74 (06/29 1044) Resp:  [15] 15 (06/29 1044) BP: (132)/(75) 132/75 mmHg (06/29 1044) SpO2:  [99 %] 99 % (06/29 1044) Weight:  [304 lb (137.893 kg)] 304 lb (137.893 kg) (06/29 1044)   General:   Alert,  Well-developed, well-nourished, pleasant and cooperative in NAD Lungs:  Clear throughout to auscultation.   Heart:  Regular rate and rhythm; no murmurs, clicks, rubs,  or gallops. Abdomen:  Soft, nontender and nondistended. Normal bowel sounds.   Neuro/Psych:  Alert and cooperative. Normal mood and affect. A and O x 3   @K .Denzil Magnuson, MD [facility] [phone] (pager) 08/30/2015 11:21 AM@

## 2015-08-30 NOTE — Anesthesia Preprocedure Evaluation (Signed)
Anesthesia Evaluation  Patient identified by MRN, date of birth, ID band Patient awake    Reviewed: Allergy & Precautions, NPO status , Patient's Chart, lab work & pertinent test results  Airway Mallampati: II  TM Distance: >3 FB Neck ROM: Full    Dental no notable dental hx.    Pulmonary sleep apnea and Continuous Positive Airway Pressure Ventilation ,    Pulmonary exam normal breath sounds clear to auscultation       Cardiovascular hypertension, Pt. on medications Normal cardiovascular exam Rhythm:Regular Rate:Normal     Neuro/Psych negative neurological ROS  negative psych ROS   GI/Hepatic negative GI ROS, Neg liver ROS,   Endo/Other  Morbid obesity  Renal/GU negative Renal ROS  negative genitourinary   Musculoskeletal negative musculoskeletal ROS (+)   Abdominal   Peds negative pediatric ROS (+)  Hematology negative hematology ROS (+)   Anesthesia Other Findings   Reproductive/Obstetrics negative OB ROS                             Anesthesia Physical Anesthesia Plan  ASA: III  Anesthesia Plan: MAC   Post-op Pain Management:    Induction: Intravenous  Airway Management Planned: Simple Face Mask  Additional Equipment:   Intra-op Plan:   Post-operative Plan:   Informed Consent: I have reviewed the patients History and Physical, chart, labs and discussed the procedure including the risks, benefits and alternatives for the proposed anesthesia with the patient or authorized representative who has indicated his/her understanding and acceptance.   Dental advisory given  Plan Discussed with: CRNA and Surgeon  Anesthesia Plan Comments:         Anesthesia Quick Evaluation

## 2015-08-30 NOTE — Transfer of Care (Signed)
Immediate Anesthesia Transfer of Care Note  Patient: Hailey Vance  Procedure(s) Performed: Procedure(s): COLONOSCOPY WITH PROPOFOL (N/A)  Patient Location: PACU and Endoscopy Unit  Anesthesia Type:MAC  Level of Consciousness: awake, alert , oriented and patient cooperative  Airway & Oxygen Therapy: Patient Spontanous Breathing  Post-op Assessment: Report given to RN, Post -op Vital signs reviewed and stable and Patient moving all extremities  Post vital signs: Reviewed and stable  Last Vitals:  Filed Vitals:   08/30/15 1149 08/30/15 1150  BP: 124/68 124/68  Pulse: 75 76  Temp: 36.4 C   Resp: 21 21    Last Pain: There were no vitals filed for this visit.       Complications: No apparent anesthesia complications

## 2015-08-30 NOTE — Anesthesia Postprocedure Evaluation (Signed)
Anesthesia Post Note  Patient: Hailey Vance  Procedure(s) Performed: Procedure(s) (LRB): COLONOSCOPY WITH PROPOFOL (N/A)  Patient location during evaluation: PACU Anesthesia Type: MAC Level of consciousness: awake and alert Pain management: pain level controlled Vital Signs Assessment: post-procedure vital signs reviewed and stable Respiratory status: spontaneous breathing, nonlabored ventilation, respiratory function stable and patient connected to nasal cannula oxygen Cardiovascular status: blood pressure returned to baseline and stable Postop Assessment: no signs of nausea or vomiting Anesthetic complications: no    Last Vitals:  Filed Vitals:   08/30/15 1149 08/30/15 1150  BP: 124/68 124/68  Pulse: 75 76  Temp: 36.4 C   Resp: 21 21    Last Pain: There were no vitals filed for this visit.               Miyo Aina S

## 2015-08-30 NOTE — Op Note (Signed)
[facility] Patient Name: Hailey Vance Procedure Date : 08/30/2015 MRN: [id number] Attending MD: Mauri Pole , MD Date of Birth: [birth year] CSN: [id number] Age: 50 Admit Type: Outpatient Procedure:                Colonoscopy Indications:              Screening for colorectal malignant neoplasm, This                            is the patient's first colonoscopy Providers:                Mauri Pole, MD, Vista Lawman, RN, Cletis Athens, Technician Referring MD:              Medicines:                Monitored Anesthesia Care Complications:            No immediate complications. Estimated Blood Loss:     Estimated blood loss: none. Procedure:                Pre-Anesthesia Assessment:                           - Prior to the procedure, a History and Physical                            was performed, and patient medications and                            allergies were reviewed. The patient's tolerance of                            previous anesthesia was also reviewed. The risks                            and benefits of the procedure and the sedation                            options and risks were discussed with the patient.                            All questions were answered, and informed consent                            was obtained. Prior Anticoagulants: The patient has                            taken no previous anticoagulant or antiplatelet                            agents. ASA Grade Assessment: III - A patient with  severe systemic disease. After reviewing the risks                            and benefits, the patient was deemed in                            satisfactory condition to undergo the procedure.                           After obtaining informed consent, the colonoscope                            was passed under direct vision. Throughout the                            procedure,  the patient's blood pressure, pulse, and                            oxygen saturations were monitored continuously. The                            [id number]) scope was introduced through                            the anus and advanced to the the terminal ileum,                            with identification of the appendiceal orifice and                            IC valve. The colonoscopy was performed without                            difficulty. The patient tolerated the procedure                            well. The quality of the bowel preparation was                            excellent. The ileocecal valve, appendiceal                            orifice, and rectum were photographed. The quality                            of the bowel preparation was excellent. The                            terminal ileum, ileocecal valve, appendiceal                            orifice, and rectum were photographed. Scope In: 11:25:42 AM Scope Out: 11:40:58 AM Scope Withdrawal Time: 0 hours 9 minutes 5 seconds  Total Procedure Duration: 0 hours  15 minutes 16 seconds  Findings:      The perianal and digital rectal examinations were normal.      The entire examined colon appeared normal. Impression:               - The entire examined colon is normal.                           - No specimens collected. Moderate Sedation:      N/A Recommendation:           - Patient has a contact number available for                            emergencies. The signs and symptoms of potential                            delayed complications were discussed with the                            patient. Return to normal activities tomorrow.                            Written discharge instructions were provided to the                            patient.                           - Resume previous diet.                           - Continue present medications.                           - Repeat colonoscopy  in 10 years for screening                            purposes.                           - Return to GI clinic PRN. Procedure Code(s):        --- Professional ---                           [id number], Colorectal cancer screening; colonoscopy on                            individual not meeting criteria for high risk Diagnosis Code(s):        --- Professional ---                           Z12.11, Encounter for screening for malignant                            neoplasm of colon CPT copyright 2016 American Medical Association. All rights reserved. The codes documented in this report are preliminary and upon coder review may  be revised to meet current compliance requirements. Mauri Pole, MD 08/30/2015 12:04:47 PM This report has been signed electronically. Number of Addenda: 0

## 2015-09-03 ENCOUNTER — Ambulatory Visit: Payer: BLUE CROSS/BLUE SHIELD | Admitting: Family

## 2015-09-03 DIAGNOSIS — Z0289 Encounter for other administrative examinations: Secondary | ICD-10-CM

## 2015-09-05 ENCOUNTER — Encounter: Payer: Self-pay | Admitting: Family

## 2015-09-05 ENCOUNTER — Ambulatory Visit: Payer: BLUE CROSS/BLUE SHIELD | Admitting: Family Medicine

## 2015-09-05 ENCOUNTER — Telehealth: Payer: Self-pay | Admitting: Family

## 2015-09-05 NOTE — Telephone Encounter (Signed)
Yes pls

## 2015-09-05 NOTE — Telephone Encounter (Signed)
Patient was No Show on 09/03/2015. Previous No Show 08/06/2015  Charge or No Charge?

## 2015-09-11 ENCOUNTER — Ambulatory Visit (INDEPENDENT_AMBULATORY_CARE_PROVIDER_SITE_OTHER): Payer: BLUE CROSS/BLUE SHIELD | Admitting: Family

## 2015-09-11 ENCOUNTER — Ambulatory Visit (INDEPENDENT_AMBULATORY_CARE_PROVIDER_SITE_OTHER): Payer: BLUE CROSS/BLUE SHIELD | Admitting: Family Medicine

## 2015-09-11 ENCOUNTER — Encounter: Payer: Self-pay | Admitting: Family

## 2015-09-11 ENCOUNTER — Encounter: Payer: Self-pay | Admitting: Family Medicine

## 2015-09-11 VITALS — BP 133/87 | HR 72 | Ht 64.0 in | Wt 304.0 lb

## 2015-09-11 VITALS — BP 120/70 | HR 62 | Temp 98.1°F | Ht 64.0 in | Wt 300.8 lb

## 2015-09-11 DIAGNOSIS — M25551 Pain in right hip: Secondary | ICD-10-CM | POA: Diagnosis not present

## 2015-09-11 DIAGNOSIS — F329 Major depressive disorder, single episode, unspecified: Secondary | ICD-10-CM

## 2015-09-11 DIAGNOSIS — R05 Cough: Secondary | ICD-10-CM | POA: Diagnosis not present

## 2015-09-11 DIAGNOSIS — F32A Depression, unspecified: Secondary | ICD-10-CM

## 2015-09-11 DIAGNOSIS — R059 Cough, unspecified: Secondary | ICD-10-CM

## 2015-09-11 DIAGNOSIS — I10 Essential (primary) hypertension: Secondary | ICD-10-CM | POA: Diagnosis not present

## 2015-09-11 LAB — BASIC METABOLIC PANEL
BUN: 15 mg/dL (ref 6–23)
CALCIUM: 9.1 mg/dL (ref 8.4–10.5)
CO2: 30 meq/L (ref 19–32)
Chloride: 104 mEq/L (ref 96–112)
Creatinine, Ser: 0.91 mg/dL (ref 0.40–1.20)
GFR: 83.63 mL/min (ref >60.00)
Glucose, Bld: 89 mg/dL (ref 70–99)
Potassium: 3.7 mEq/L (ref 3.5–5.1)
SODIUM: 139 meq/L (ref 135–145)

## 2015-09-11 MED ORDER — LORATADINE 10 MG PO TABS: 10.0000 mg | ORAL_TABLET | Freq: Every day | ORAL | Status: DC

## 2015-09-11 MED ORDER — VENLAFAXINE HCL ER 37.5 MG PO CP24: ORAL_CAPSULE | ORAL | Status: DC

## 2015-09-11 NOTE — Progress Notes (Signed)
   Subjective:    Patient ID: Hailey Vance, female    DOB: 02/13/1966, 50 y.o.   MRN: [id number]  HPI  Hailey Vance is a 50 yr old female who presents today for follow up.  1) HTN- on zestoretic. BP Readings from Last 3 Encounters:  09/11/15 120/70  09/11/15 133/87  08/30/15 143/82   2) Cough-  Reports that she had a cold about 6 weeks ago with sinus pressure.  Reports that symptoms then resolved.  Now she has a mild cough, productive of clear phlegm.  denies refux symptoms.  3) Depression- patient is maintained on prozac.  Gets easily frustrated, angry. Mood is up and down.  Reports that her family is noticing her mood changes.  She feels that her symptoms have worsened since her TAH/BSO.     Review of Systems  Respiratory: Negative for shortness of breath.   Cardiovascular: Negative for chest pain and leg swelling.       Objective:   Physical Exam  Constitutional: She appears well-developed and well-nourished.  HENT:  Head: Normocephalic and atraumatic.  Right Ear: Tympanic membrane and ear canal normal.  Left Ear: Tympanic membrane and ear canal normal.  Mouth/Throat: No oropharyngeal exudate, posterior oropharyngeal edema or posterior oropharyngeal erythema.  Cardiovascular: Normal rate, regular rhythm and normal heart sounds.   No murmur heard. Pulmonary/Chest: Effort normal and breath sounds normal. No respiratory distress. She has no wheezes.  Psychiatric: She has a normal mood and affect. Her behavior is normal. Judgment and thought content normal.          Assessment & Plan:  Cough- likely allergy related. Advised pt to begin claritin. If this does not help may need to trial off of ACE.  Depression- uncontrolled.  Reports that she only felt "spacey" on effexor previously when she abruptly discontinued. Wil have pt continue prozac and add back in the effexor. Advised her that this may also help with her hot flashes.   HTN- BP stable on zestoretic. Continue same,  obtain follow up bmet.

## 2015-09-11 NOTE — Patient Instructions (Signed)
Please complete lab work prior to leaving. For cough- please begin claritin 10mg  once daily. Let me know if cough worsens or does not improve. For depression- continue your current dose of prozac. Add effexor 1 tab once daily for 3 days, then increase to 2 tabs once daily. Follow up with Dr. Philis Pique to discuss your hot flashes and consideration for estrogen replacement therapy.   Follow up in 1 month.

## 2015-09-11 NOTE — Patient Instructions (Signed)
I'm concerned you have a labral tear in this hip in addition to hip flexor strain. Most people respond to conservative treatment for this. Take mobic only if needed. Start physical therapy. Do home exercises on days you don't go to therapy. Follow up with me in 6 weeks.

## 2015-09-11 NOTE — Progress Notes (Signed)
Pre visit review using our clinic review tool, if applicable. No additional management support is needed unless otherwise documented below in the visit note. 

## 2015-09-12 NOTE — Assessment & Plan Note (Signed)
independently reviewed radiographs previously - very mild arthritis - no evidence impingement.  Still concerned about labral tear, possible flap component flipping into joint and causing catching/locking.  She hasn't started physical therapy so will do this.  Mobic as needed.  Home exercises as well.  F/u in 6 weeks.  Consider MR arthrogram if not improving.

## 2015-09-12 NOTE — Progress Notes (Signed)
PCP and consultation requested by: Nance Pear., NP  Subjective:   HPI: Patient is a 50 y.o. female here for right hip pain.  6/7: Patient reports she's had pain in right hip since late April when she slipped and fell down onto her buttocks. No swelling, bruising. Had some soreness then. Now having pain when turning over in bed, pain more anterior. Pain level is 5/10, sharp. Worse driving also. Taking meloxicam which helps. No skin changes, numbness. No back pain.  7/11: Patient reports no changes since last visit. Pain is 0/10 at rest, still can be up to 5/10 and sharp anteriorly. Still getting catching, locking at times. Injection did not help. Has not started physical therapy yet. Worse with turning over, walking. No skin changes, numbness. No radiation.  Past Medical History  Diagnosis Date  . Hypertension   . Sleep apnea     CPAP  . Blood transfusion 1990    In [place], [place] with C/S Surgery - 5 units transfused  . Headache(784.0)     otc med prn  . Plantar fasciitis of left foot   . Arthritis     osteoarthritis - knees  . Chronic back pain   . Blood transfusion without reported diagnosis 1990    after a c/section  . Anxiety   . Depression   . Fibroid   . Vitamin D deficiency 09/06/2014  . Chest pain   . Low blood potassium     Current Outpatient Prescriptions on File Prior to Visit  Medication Sig Dispense Refill  . aspirin-acetaminophen-caffeine (EXCEDRIN MIGRAINE) 250-250-65 MG per tablet Take 2 tablets by mouth every 6 (six) hours as needed for headache or migraine.    . Cholecalciferol (VITAMIN D3) 3000 units TABS Take 1 tablet by mouth daily. (Patient not taking: Reported on 09/11/2015) 30 tablet   . FLUoxetine (PROZAC) 20 MG capsule Take 20 mg by mouth daily.    Marland Kitchen lisinopril-hydrochlorothiazide (PRINZIDE,ZESTORETIC) 20-25 MG tablet Take 0.5 tablets by mouth daily. 45 tablet 1  . meloxicam (MOBIC) 7.5 MG tablet Take 1 tablet (7.5 mg total) by  mouth daily. 30 tablet 2  . nystatin (MYCOSTATIN/NYSTOP) 100000 UNIT/GM POWD Apply twice daily to affected area as needed 30 g 1  . potassium chloride SA (K-DUR,KLOR-CON) 20 MEQ tablet Take 1 tablet (20 mEq total) by mouth daily. 30 tablet 3  . [DISCONTINUED] LISINOPRIL PO Take by mouth.     No current facility-administered medications on file prior to visit.    Past Surgical History  Procedure Laterality Date  . Cesarean section  1990    x 1  . Hernia repair    . Bilateral oophorectomy      Complete removal of left ovary and partial removal of right ovary.  . Knee surgery      right knee  . Tubal ligation  1990  . Hysteroscopy w/d&c N/A 01/11/2015    Procedure: DILATATION AND CURETTAGE /HYSTEROSCOPY;  Surgeon: Jerelyn Charles, MD;  Location: [facility];  Service: Gynecology;  Laterality: N/A;  . Robotic assisted total hysterectomy with salpingectomy Bilateral 05/23/2015    Procedure: ROBOTIC ASSISTED TOTAL HYSTERECTOMY WITH SALPINGECTOMYwith right oophorectomy;  Surgeon: Bobbye Charleston, MD;  Location: [facility];  Service: Gynecology;  Laterality: Bilateral;  . Cystoscopy N/A 05/23/2015    Procedure: CYSTOSCOPY;  Surgeon: Bobbye Charleston, MD;  Location: [facility];  Service: Gynecology;  Laterality: N/A;  . Colonoscopy with propofol N/A 08/30/2015    Procedure: COLONOSCOPY WITH PROPOFOL;  Surgeon: Mauri Pole, MD;  Location: [facility];  Service: Endoscopy;  Laterality: N/A;    Allergies  Allergen Reactions  . Penicillins Itching    Has patient had a PCN reaction causing immediate rash, facial/tongue/throat swelling, SOB or lightheadedness with hypotension: No Has patient had a PCN reaction causing severe rash involving mucus membranes or skin necrosis: No Has patient had a PCN reaction that required hospitalization No Has patient had a PCN reaction occurring within the last 10 years: No If all of the above answers are "NO", then may proceed with Cephalosporin use.   . Venlafaxine  Other (See Comments)    "felt spacey"    Social History   Social History  . Marital Status: Divorced    Spouse Name: N/A  . Number of Children: N/A  . Years of Education: N/A   Occupational History  . Not on file.   Social History Main Topics  . Smoking status: Never Smoker   . Smokeless tobacco: Never Used  . Alcohol Use: No  . Drug Use: No  . Sexual Activity:    Partners: Male    Birth Control/ Protection: Surgical     Comment: Tubal   Other Topics Concern  . Not on file   Social History Narrative   Divorced   Lives with son and grand-daughter   [place] (lives in [place])   [place] lives with patient   77- Daughter [name] (lives in [place])   Works at [employer]   Completed some college   Careers adviser       Family History  Problem Relation Age of Onset  . Lung cancer Mother 87    dec  . Cancer Mother   . Hypertension Mother   . Arthritis Mother   . Cancer Father 65    prostate  . Hypertension Father   . Leukemia Sister   . Hypertension Sister   . Arthritis Sister   . Hyperlipidemia Sister   . Hypertension Brother   . Arthritis Brother   . Breast cancer Paternal Aunt   . Diabetes Maternal Grandmother   . Stroke Maternal Grandmother   . Heart attack Paternal Grandmother   . Multiple sclerosis Sister   . Hypertension Sister   . Hypertension Sister   . Hypertension Brother   . Hypertension Brother     BP 133/87 mmHg  Pulse 72  Ht 5\' 4"  (1.626 m)  Wt 304 lb (137.893 kg)  BMI 52.16 kg/m2  LMP 10/02/2007  Review of Systems: See HPI above.    Objective:  Physical Exam:  Gen: NAD, comfortable in exam room  Back/Right hip: No gross deformity, scoliosis. Mild TTP ant hip.  No back, other tenderness.  No midline or bony TTP. FROM with minimal pain on internal and external rotation of hip. Strength LEs 5/5 all muscle groups.   2+ MSRs in patellar and achilles tendons, equal  bilaterally. Negative SLRs. Sensation intact to light touch bilaterally. Minimal pain right logroll now. Negative fabers and piriformis stretches.    Assessment & Plan:  1. Right hip pain - independently reviewed radiographs previously - very mild arthritis - no evidence impingement.  Still concerned about labral tear, possible flap component flipping into joint and causing catching/locking.  She hasn't started physical therapy so will do this.  Mobic as needed.  Home exercises as well.  F/u in 6 weeks.  Consider MR arthrogram if not improving.

## 2015-09-12 NOTE — Addendum Note (Signed)
Addended by: Sherrie George F on: 09/12/2015 03:27 PM   Modules accepted: Orders

## 2015-10-03 ENCOUNTER — Telehealth: Payer: Self-pay | Admitting: Family

## 2015-10-04 NOTE — Telephone Encounter (Signed)
Completed.

## 2015-10-16 ENCOUNTER — Other Ambulatory Visit: Payer: Self-pay | Admitting: Family

## 2015-10-16 ENCOUNTER — Ambulatory Visit: Payer: BLUE CROSS/BLUE SHIELD | Admitting: Family

## 2015-10-17 NOTE — Telephone Encounter (Signed)
[name]-- please advise vitamin D request.  Last vitamin D level 08/2015 and was 31.48.  Refill sent for nystatin. Lisinopril HCTZ 20/25 rx denied as directions were for 1 tablet daily. Pt currently taking 1/2 tablet daily and per Alvie Heidelberg at [facility], Rx filled on 08/10/15 for 1/2 tablet daily. Advised her to d/c today's request.

## 2015-10-18 NOTE — Telephone Encounter (Signed)
Instead of weekly vitamin D, I would like her to begin vit d 3000 units once daily

## 2015-10-23 ENCOUNTER — Other Ambulatory Visit: Payer: Self-pay | Admitting: Family

## 2015-11-02 ENCOUNTER — Other Ambulatory Visit: Payer: Self-pay | Admitting: Family

## 2015-12-04 ENCOUNTER — Ambulatory Visit: Payer: Self-pay | Admitting: Family

## 2015-12-10 ENCOUNTER — Ambulatory Visit (INDEPENDENT_AMBULATORY_CARE_PROVIDER_SITE_OTHER): Payer: BLUE CROSS/BLUE SHIELD | Admitting: Family

## 2015-12-10 ENCOUNTER — Telehealth: Payer: Self-pay | Admitting: Family

## 2015-12-10 DIAGNOSIS — Z0289 Encounter for other administrative examinations: Secondary | ICD-10-CM

## 2015-12-10 MED ORDER — MELOXICAM 7.5 MG PO TABS: 7.5000 mg | ORAL_TABLET | Freq: Every day | ORAL | 2 refills | Status: DC

## 2015-12-10 MED ORDER — LISINOPRIL-HYDROCHLOROTHIAZIDE 20-25 MG PO TABS: 0.5000 | ORAL_TABLET | Freq: Every day | ORAL | 1 refills | Status: DC

## 2015-12-10 NOTE — Telephone Encounter (Signed)
Caller name: Jaydenn Relation to pt: sekf Call back number:[phone] Pharmacy:[facility] [address]   Reason for call: Pt is requesting refills on Lisinopril Hydrochlorothiazide (tab) (Prinzide,Zestoretic) 20-25-mg and Meloxicam (tab) (Mobic) 7.5 mg. Please advise.

## 2015-12-10 NOTE — Telephone Encounter (Signed)
Refills sent. Please do not charge pt the no show fee from today. Thanks!

## 2015-12-11 ENCOUNTER — Encounter: Payer: Self-pay | Admitting: Family

## 2015-12-11 NOTE — Telephone Encounter (Signed)
LVM that prescription was sent to pharmacy for pt to get at pharmacy.

## 2015-12-21 ENCOUNTER — Encounter: Payer: Self-pay | Admitting: Family

## 2015-12-21 ENCOUNTER — Telehealth: Payer: Self-pay | Admitting: Family

## 2015-12-21 ENCOUNTER — Ambulatory Visit (INDEPENDENT_AMBULATORY_CARE_PROVIDER_SITE_OTHER): Payer: BLUE CROSS/BLUE SHIELD | Admitting: Family

## 2015-12-21 VITALS — BP 122/82 | HR 67 | Temp 98.5°F | Ht 64.0 in | Wt 311.8 lb

## 2015-12-21 DIAGNOSIS — F419 Anxiety disorder, unspecified: Secondary | ICD-10-CM

## 2015-12-21 DIAGNOSIS — F418 Other specified anxiety disorders: Secondary | ICD-10-CM

## 2015-12-21 DIAGNOSIS — F329 Major depressive disorder, single episode, unspecified: Secondary | ICD-10-CM

## 2015-12-21 DIAGNOSIS — R350 Frequency of micturition: Secondary | ICD-10-CM

## 2015-12-21 DIAGNOSIS — F32A Depression, unspecified: Secondary | ICD-10-CM

## 2015-12-21 LAB — POC URINALSYSI DIPSTICK (AUTOMATED)
Bilirubin, UA: NEGATIVE
Blood, UA: NEGATIVE
GLUCOSE UA: NEGATIVE
Ketones, UA: NEGATIVE
LEUKOCYTES UA: NEGATIVE
NITRITE UA: NEGATIVE
Protein, UA: NEGATIVE
UROBILINOGEN UA: 0.2
pH, UA: 6

## 2015-12-21 MED ORDER — FLUOXETINE HCL 20 MG PO CAPS: 20.0000 mg | ORAL_CAPSULE | Freq: Every day | ORAL | 5 refills | Status: DC

## 2015-12-21 MED ORDER — POTASSIUM CHLORIDE CRYS ER 20 MEQ PO TBCR: 20.0000 meq | EXTENDED_RELEASE_TABLET | Freq: Every day | ORAL | 3 refills | Status: DC

## 2015-12-21 MED ORDER — VENLAFAXINE HCL ER 37.5 MG PO CP24: ORAL_CAPSULE | ORAL | 5 refills | Status: DC

## 2015-12-21 NOTE — Patient Instructions (Addendum)
We will contact you with your urine culture results.  Continue current medications.

## 2015-12-21 NOTE — Assessment & Plan Note (Signed)
Improved. Continue effexor and prozac.

## 2015-12-21 NOTE — Progress Notes (Signed)
Pre visit review using our clinic review tool, if applicable. No additional management support is needed unless otherwise documented below in the visit note. 

## 2015-12-21 NOTE — Progress Notes (Signed)
Subjective:    Patient ID: Hailey Vance, female    DOB: [birth year], 50 y.o.   MRN: [id number]  HPI  Hailey Vance is a 50 yr old female who presents today for follow up.  1) Depression-  She was last seen in July of this year. At that time she reported that she can become easily frustrated/angry.  Mood was up and down and family had been noticing her mood changes.  She was started on on effexor and she was continued on prozac.  She reports feeling better since she started the effexor.  She reports that hot flashes are not as severe since she started the effexor. No longer having night sweats.  2) urinary frequency- Reports that that she is having urinary urgency/frequency.  She has had some urinary incontinence.   Review of Systems See HPI  Past Medical History:  Diagnosis Date  . Anxiety   . Arthritis    osteoarthritis - knees  . Blood transfusion 1990   In [place], [place] with C/S Surgery - 5 units transfused  . Blood transfusion without reported diagnosis 1990   after a c/section  . Chest pain   . Chronic back pain   . Depression   . Fibroid   . Headache(784.0)    otc med prn  . Hypertension   . Low blood potassium   . Plantar fasciitis of left foot   . Sleep apnea    CPAP  . Vitamin D deficiency 09/06/2014     Social History   Social History  . Marital status: Divorced    Spouse name: N/A  . Number of children: N/A  . Years of education: N/A   Occupational History  . Not on file.   Social History Main Topics  . Smoking status: Never Smoker  . Smokeless tobacco: Never Used  . Alcohol use No  . Drug use: No  . Sexual activity: Not Currently    Partners: Male    Birth control/ protection: Surgical     Comment: Tubal   Other Topics Concern  . Not on file   Social History Narrative   Divorced   Lives with son and grand-daughter   [place] (lives in [place])   [place] lives with patient   80- Daughter Hailey Vance (lives in [place])   Works at  [employer]   Completed some college   Careers adviser       Past Surgical History:  Procedure Laterality Date  . BILATERAL OOPHORECTOMY     Complete removal of left ovary and partial removal of right ovary.  . CESAREAN SECTION  1990   x 1  . COLONOSCOPY WITH PROPOFOL N/A 08/30/2015   Procedure: COLONOSCOPY WITH PROPOFOL;  Surgeon: Mauri Pole, MD;  Location: [facility];  Service: Endoscopy;  Laterality: N/A;  . CYSTOSCOPY N/A 05/23/2015   Procedure: CYSTOSCOPY;  Surgeon: Bobbye Charleston, MD;  Location: [facility];  Service: Gynecology;  Laterality: N/A;  . HERNIA REPAIR    . HYSTEROSCOPY W/D&C N/A 01/11/2015   Procedure: DILATATION AND CURETTAGE /HYSTEROSCOPY;  Surgeon: Jerelyn Charles, MD;  Location: [facility];  Service: Gynecology;  Laterality: N/A;  . KNEE SURGERY     right knee  . ROBOTIC ASSISTED TOTAL HYSTERECTOMY WITH SALPINGECTOMY Bilateral 05/23/2015   Procedure: ROBOTIC ASSISTED TOTAL HYSTERECTOMY WITH SALPINGECTOMYwith right oophorectomy;  Surgeon: Bobbye Charleston, MD;  Location: [facility];  Service: Gynecology;  Laterality: Bilateral;  . [place]  Family History  Problem Relation Age of Onset  . Lung cancer Mother 41    dec  . Cancer Mother   . Hypertension Mother   . Arthritis Mother   . Cancer Father 61    prostate  . Hypertension Father   . Leukemia Sister   . Hypertension Sister   . Arthritis Sister   . Hyperlipidemia Sister   . Hypertension Brother   . Arthritis Brother   . Multiple sclerosis Sister   . Hypertension Sister   . Hypertension Sister   . Hypertension Brother   . Hypertension Brother   . Breast cancer Paternal Aunt   . Diabetes Maternal Grandmother   . Stroke Maternal Grandmother   . Heart attack Paternal Grandmother     Allergies  Allergen Reactions  . Penicillins Itching    Has patient had a PCN reaction causing immediate rash, facial/tongue/throat swelling, SOB or lightheadedness with  hypotension: No Has patient had a PCN reaction causing severe rash involving mucus membranes or skin necrosis: No Has patient had a PCN reaction that required hospitalization No Has patient had a PCN reaction occurring within the last 10 years: No If all of the above answers are "NO", then may proceed with Cephalosporin use.   . Venlafaxine Other (See Comments)    "felt spacey"    Current Outpatient Prescriptions on File Prior to Visit  Medication Sig Dispense Refill  . aspirin-acetaminophen-caffeine (EXCEDRIN MIGRAINE) 250-250-65 MG per tablet Take 2 tablets by mouth every 6 (six) hours as needed for headache or migraine.    . Cholecalciferol (VITAMIN D3) 3000 units TABS Take 1 tablet by mouth daily. 30 tablet   . FLUoxetine (PROZAC) 20 MG capsule Take 20 mg by mouth daily.    Marland Kitchen lisinopril-hydrochlorothiazide (PRINZIDE,ZESTORETIC) 20-25 MG tablet Take 0.5 tablets by mouth daily. 45 tablet 1  . loratadine (CLARITIN) 10 MG tablet Take 1 tablet (10 mg total) by mouth daily. 30 tablet 11  . meloxicam (MOBIC) 7.5 MG tablet Take 1 tablet (7.5 mg total) by mouth daily. 30 tablet 2  . nystatin (MYCOSTATIN/NYSTOP) powder APPLY  POWDER TOPICALLY TWICE DAILY TO  AFFECTED  AREA  AS  NEEDED 30 g 1  . potassium chloride SA (K-DUR,KLOR-CON) 20 MEQ tablet Take 1 tablet (20 mEq total) by mouth daily. 30 tablet 3  . venlafaxine XR (EFFEXOR-XR) 37.5 MG 24 hr capsule TAKE ONE CAPSULE BY MOUTH ONCE DAILY FOR 3 DAYS THEN  INCREASE  TO  TWO  CAPSULES  ONCE  DAILY 60 capsule 0  . [DISCONTINUED] LISINOPRIL PO Take by mouth.     No current facility-administered medications on file prior to visit.     BP 122/82 (BP Location: Left Arm, Patient Position: Sitting, Cuff Size: Large)   Pulse 67   Temp 98.5 F (36.9 C) (Oral)   Ht 5\' 4"  (1.626 m)   Wt (!) 311 lb 12.8 oz (141.4 kg)   LMP 10/02/2007 Comment: but had bleeding 18 months ago for short period of time and began again 08/2013  SpO2 97%   BMI 53.52 kg/m        Objective:   Physical Exam  Constitutional: She is oriented to person, place, and time. She appears well-developed and well-nourished.  HENT:  Head: Normocephalic and atraumatic.  Cardiovascular: Normal rate, regular rhythm and normal heart sounds.   No murmur heard. Pulmonary/Chest: Effort normal and breath sounds normal. No respiratory distress. She has no wheezes.  Musculoskeletal: She exhibits no edema.  Neurological: She  is alert and oriented to person, place, and time.  Psychiatric: She has a normal mood and affect. Her behavior is normal. Judgment and thought content normal.          Assessment & Plan:  Declines flu shot .   Urinary frequency-  UA unremarkable. Will send urine for culture. If culture is negative, consider rx for overactive bladder.

## 2015-12-28 NOTE — Telephone Encounter (Signed)
Error

## 2016-03-26 ENCOUNTER — Ambulatory Visit (INDEPENDENT_AMBULATORY_CARE_PROVIDER_SITE_OTHER): Payer: BLUE CROSS/BLUE SHIELD | Admitting: Family

## 2016-03-26 ENCOUNTER — Encounter: Payer: Self-pay | Admitting: Family

## 2016-03-26 VITALS — BP 127/81 | HR 71 | Temp 98.4°F | Resp 16 | Ht 64.0 in | Wt 320.0 lb

## 2016-03-26 DIAGNOSIS — R609 Edema, unspecified: Secondary | ICD-10-CM | POA: Diagnosis not present

## 2016-03-26 MED ORDER — FUROSEMIDE 20 MG PO TABS: 20.0000 mg | ORAL_TABLET | Freq: Every day | ORAL | 3 refills | Status: DC | PRN

## 2016-03-26 MED ORDER — MELOXICAM 7.5 MG PO TABS: 7.5000 mg | ORAL_TABLET | Freq: Every day | ORAL | 2 refills | Status: DC

## 2016-03-26 NOTE — Progress Notes (Signed)
Subjective:    Patient ID: Hailey Vance, female    DOB: 09-Feb-1966, 51 y.o.   MRN: [id number]  HPI   Hailey Vance is a 51 yr old female who presents today with chief complaint of edema- Pt reports bilateral foot/ hand swelling last week. She has gained 20 pounds since the summer. Notes that last week she could not even get on regular shoes.  She placed compression stockings over her feet which helped. Possibly standing more.  She reports that she denies SOB unless she is climbing stairs with bags.    She reports that she is not exercising much due to her chronic right hip pain and back pain.  Wants to look for a water aerobics.   Wt Readings from Last 3 Encounters:  03/26/16 (!) 320 lb (145.2 kg)  12/21/15 (!) 311 lb 12.8 oz (141.4 kg)  09/11/15 (!) 300 lb 12.8 oz (136.4 kg)   Review of Systems    see HPI  Past Medical History:  Diagnosis Date  . Anxiety   . Arthritis    osteoarthritis - knees  . Blood transfusion 1990   In [place], [place] with C/S Surgery - 5 units transfused  . Blood transfusion without reported diagnosis 1990   after a c/section  . Chest pain   . Chronic back pain   . Depression   . Fibroid   . Headache(784.0)    otc med prn  . Hypertension   . Low blood potassium   . Plantar fasciitis of left foot   . Sleep apnea    CPAP  . Vitamin D deficiency 09/06/2014     Social History   Social History  . Marital status: Divorced    Spouse name: N/A  . Number of children: N/A  . Years of education: N/A   Occupational History  . Not on file.   Social History Main Topics  . Smoking status: Never Smoker  . Smokeless tobacco: Never Used  . Alcohol use No  . Drug use: No  . Sexual activity: Not Currently    Partners: Male    Birth control/ protection: Surgical     Comment: Tubal   Other Topics Concern  . Not on file   Social History Narrative   Divorced   Lives with son and grand-daughter   [place] (lives in [place])   [place] lives  with patient   39- Daughter Hailey Vance (lives in [place])   Works at [employer]   Completed some college   Careers adviser       Past Surgical History:  Procedure Laterality Date  . BILATERAL OOPHORECTOMY     Complete removal of left ovary and partial removal of right ovary.  . CESAREAN SECTION  1990   x 1  . COLONOSCOPY WITH PROPOFOL N/A 08/30/2015   Procedure: COLONOSCOPY WITH PROPOFOL;  Surgeon: Mauri Pole, MD;  Location: [facility];  Service: Endoscopy;  Laterality: N/A;  . CYSTOSCOPY N/A 05/23/2015   Procedure: CYSTOSCOPY;  Surgeon: Bobbye Charleston, MD;  Location: [facility];  Service: Gynecology;  Laterality: N/A;  . HERNIA REPAIR    . HYSTEROSCOPY W/D&C N/A 01/11/2015   Procedure: DILATATION AND CURETTAGE /HYSTEROSCOPY;  Surgeon: Jerelyn Charles, MD;  Location: [facility];  Service: Gynecology;  Laterality: N/A;  . KNEE SURGERY     right knee  . ROBOTIC ASSISTED TOTAL HYSTERECTOMY WITH SALPINGECTOMY Bilateral 05/23/2015   Procedure: ROBOTIC ASSISTED TOTAL HYSTERECTOMY WITH SALPINGECTOMYwith right oophorectomy;  Surgeon: Bobbye Charleston, MD;  Location: [facility];  Service: Gynecology;  Laterality: Bilateral;  . TUBAL LIGATION  1990    Family History  Problem Relation Age of Onset  . Lung cancer Mother 18    dec  . Cancer Mother   . Hypertension Mother   . Arthritis Mother   . Cancer Father 23    prostate  . Hypertension Father   . Leukemia Sister   . Hypertension Sister   . Arthritis Sister   . Hyperlipidemia Sister   . Hypertension Brother   . Arthritis Brother   . Multiple sclerosis Sister   . Hypertension Sister   . Hypertension Sister   . Hypertension Brother   . Hypertension Brother   . Breast cancer Paternal Aunt   . Diabetes Maternal Grandmother   . Stroke Maternal Grandmother   . Heart attack Paternal Grandmother     Allergies  Allergen Reactions  . Penicillins Itching    Has patient had a PCN reaction causing immediate  rash, facial/tongue/throat swelling, SOB or lightheadedness with hypotension: No Has patient had a PCN reaction causing severe rash involving mucus membranes or skin necrosis: No Has patient had a PCN reaction that required hospitalization No Has patient had a PCN reaction occurring within the last 10 years: No If all of the above answers are "NO", then may proceed with Cephalosporin use.   . Venlafaxine Other (See Comments)    "felt spacey"    Current Outpatient Prescriptions on File Prior to Visit  Medication Sig Dispense Refill  . aspirin-acetaminophen-caffeine (EXCEDRIN MIGRAINE) 250-250-65 MG per tablet Take 2 tablets by mouth every 6 (six) hours as needed for headache or migraine.    . Cholecalciferol (VITAMIN D3) 3000 units TABS Take 1 tablet by mouth daily. 30 tablet   . FLUoxetine (PROZAC) 20 MG capsule Take 1 capsule (20 mg total) by mouth daily. 30 capsule 5  . lisinopril-hydrochlorothiazide (PRINZIDE,ZESTORETIC) 20-25 MG tablet Take 0.5 tablets by mouth daily. 45 tablet 1  . loratadine (CLARITIN) 10 MG tablet Take 1 tablet (10 mg total) by mouth daily. 30 tablet 11  . meloxicam (MOBIC) 7.5 MG tablet Take 1 tablet (7.5 mg total) by mouth daily. 30 tablet 2  . nystatin (MYCOSTATIN/NYSTOP) powder APPLY  POWDER TOPICALLY TWICE DAILY TO  AFFECTED  AREA  AS  NEEDED 30 g 1  . potassium chloride SA (K-DUR,KLOR-CON) 20 MEQ tablet Take 1 tablet (20 mEq total) by mouth daily. 30 tablet 3  . venlafaxine XR (EFFEXOR-XR) 37.5 MG 24 hr capsule TAKE ONE CAPSULE BY MOUTH ONCE DAILY FOR 3 DAYS THEN  INCREASE  TO  TWO  CAPSULES  ONCE  DAILY 60 capsule 5  . [DISCONTINUED] LISINOPRIL PO Take by mouth.     No current facility-administered medications on file prior to visit.     BP 127/81 (BP Location: Right Arm, Cuff Size: Large)   Pulse 71   Temp 98.4 F (36.9 C) (Oral)   Resp 16   Ht 5\' 4"  (1.626 m)   Wt (!) 320 lb (145.2 kg)   LMP 10/02/2007 Comment: but had bleeding 18 months ago for short  period of time and began again 08/2013  SpO2 99% Comment: room air  BMI 54.93 kg/m    Objective:   Physical Exam  Constitutional: She appears well-developed and well-nourished.  Cardiovascular: Normal rate, regular rhythm and normal heart sounds.   No murmur heard. Pulmonary/Chest: Effort normal and breath sounds normal. No respiratory distress. She has no  wheezes.  Musculoskeletal:  2-3+ bilateral LE edema, trace hand swelling.   Psychiatric: She has a normal mood and affect. Her behavior is normal. Judgment and thought content normal.          Assessment & Plan:  Edema- new. will rx with lasix 20mg  once daily as needed. She is advised to continue potassium. Plan follow up in 2 weeks for follow up. Will need a follow up bmet at that time.  Morbid obesity- advised pt as follows:   Work on adding regular exercise such a water aerobics or water walking. Start counting calories (try to keep calories to 1500/day).

## 2016-03-26 NOTE — Progress Notes (Signed)
Pre visit review using our clinic review tool, if applicable. No additional management support is needed unless otherwise documented below in the visit note. 

## 2016-03-26 NOTE — Patient Instructions (Signed)
Please add lasix once daily as needed for swelling. Work on adding regular exercise such a water aerobics or water walking. Start counting calories (try to keep calories to 1500/day).

## 2016-04-08 ENCOUNTER — Ambulatory Visit: Payer: BLUE CROSS/BLUE SHIELD | Admitting: Family

## 2016-04-09 ENCOUNTER — Encounter: Payer: Self-pay | Admitting: Family

## 2016-05-19 ENCOUNTER — Other Ambulatory Visit: Payer: Self-pay | Admitting: Family

## 2016-05-19 NOTE — Telephone Encounter (Signed)
Pt is due for follow up please call and schedule appointment.  

## 2016-05-19 NOTE — Telephone Encounter (Signed)
Called pt. lvm to call our office to schedule a F/U

## 2016-07-23 ENCOUNTER — Other Ambulatory Visit: Payer: Self-pay | Admitting: Family

## 2016-08-06 ENCOUNTER — Telehealth: Payer: Self-pay | Admitting: Family

## 2016-08-06 ENCOUNTER — Encounter: Payer: Self-pay | Admitting: Family

## 2016-08-06 ENCOUNTER — Ambulatory Visit (INDEPENDENT_AMBULATORY_CARE_PROVIDER_SITE_OTHER): Payer: BLUE CROSS/BLUE SHIELD | Admitting: Family

## 2016-08-06 VITALS — BP 152/86 | HR 72 | Temp 98.0°F | Resp 16 | Ht 64.0 in | Wt 331.6 lb

## 2016-08-06 DIAGNOSIS — G4733 Obstructive sleep apnea (adult) (pediatric): Secondary | ICD-10-CM

## 2016-08-06 DIAGNOSIS — R350 Frequency of micturition: Secondary | ICD-10-CM | POA: Diagnosis not present

## 2016-08-06 DIAGNOSIS — M2550 Pain in unspecified joint: Secondary | ICD-10-CM

## 2016-08-06 DIAGNOSIS — R609 Edema, unspecified: Secondary | ICD-10-CM

## 2016-08-06 DIAGNOSIS — R238 Other skin changes: Secondary | ICD-10-CM | POA: Diagnosis not present

## 2016-08-06 DIAGNOSIS — R233 Spontaneous ecchymoses: Secondary | ICD-10-CM

## 2016-08-06 LAB — SEDIMENTATION RATE: SED RATE: 39 mm/h — AB (ref 0–30)

## 2016-08-06 LAB — CBC WITH DIFFERENTIAL/PLATELET
BASOS PCT: 0.8 % (ref 0.0–3.0)
Basophils Absolute: 0 10*3/uL (ref 0.0–0.1)
EOS PCT: 3.1 % (ref 0.0–5.0)
Eosinophils Absolute: 0.2 10*3/uL (ref 0.0–0.7)
HCT: 38.1 % (ref 36.0–46.0)
HEMOGLOBIN: 12.6 g/dL (ref 12.0–15.0)
LYMPHS ABS: 2.4 10*3/uL (ref 0.7–4.0)
Lymphocytes Relative: 42.1 % (ref 12.0–46.0)
MCHC: 32.9 g/dL (ref 30.0–36.0)
MCV: 99.1 fl (ref 78.0–100.0)
MONO ABS: 0.5 10*3/uL (ref 0.1–1.0)
Monocytes Relative: 9.2 % (ref 3.0–12.0)
NEUTROS ABS: 2.5 10*3/uL (ref 1.4–7.7)
Neutrophils Relative %: 44.8 % (ref 43.0–77.0)
Platelets: 232 10*3/uL (ref 150.0–400.0)
RBC: 3.85 Mil/uL — ABNORMAL LOW (ref 3.87–5.11)
RDW: 13.4 % (ref 11.5–15.5)
WBC: 5.6 10*3/uL (ref 4.0–10.5)

## 2016-08-06 NOTE — Telephone Encounter (Signed)
See my chart message

## 2016-08-06 NOTE — Assessment & Plan Note (Signed)
Never started cpap last time we ordered it. I really think untreated OSA is contributing to her symptoms and needs treatment. She is agreeable to treatment. Since it has bene >1 year since her sleep study, home health is requesting a repeat sleep study for insurance purposes.  See phone note.

## 2016-08-06 NOTE — Telephone Encounter (Signed)
Hailey Vance-- Pt has appointment with [place] today.

## 2016-08-06 NOTE — Patient Instructions (Signed)
Try to take lasix every day even if you need to take the dose later in the day.  We will work on getting your cpap set up. Start antibiotic per your GYN for possible urinary tract infection.  Complete blood work prior to leaving.

## 2016-08-07 LAB — ANA: Anti Nuclear Antibody(ANA): NEGATIVE

## 2016-08-07 LAB — RHEUMATOID FACTOR: Rhuematoid fact SerPl-aCnc: <14 IU/mL (ref <14)

## 2016-08-07 NOTE — Telephone Encounter (Signed)
Home Sleep Study not approved by insurance. Pt will likely need to see sleep specialist at [facility] and have study repeated in laboratory setting for approval.

## 2016-08-07 NOTE — Progress Notes (Signed)
HPI  Hailey Vance is a 51 yr old female who presents today with several concerns.   Joint pain- notes pain/stiffness. Notes in bilateral knees, left ankle.  Occasional shoulder  Urinary frequency- frequency/urgency. Reports dysuria started on Sunday. Started AZO  LE edema- only taking lasix 3-4 times a week due to not being able to take at work.   OSA-  Never started cpap  Falls- reports that she wakes up falling out of bed in herself.  Sometimes she feels a bit confused when she wakes up.  Reports that her right leg feels weaker than her left leg. Reports that she had surgery on t    Review of Systems  Hematological: Bruises/bleeds easily.      see HPI      Past Medical History:  Diagnosis Date  . Anxiety   . Arthritis    osteoarthritis - knees  . Blood transfusion 1990   In [place], [place] with C/S Surgery - 5 units transfused  . Blood transfusion without reported diagnosis 1990   after a c/section  . Chest pain   . Chronic back pain   . Depression   . Fibroid   . Headache(784.0)    otc med prn  . Hypertension   . Low blood potassium   . Plantar fasciitis of left foot   . Sleep apnea    CPAP  . Vitamin D deficiency 09/06/2014     Social History        Social History  . Marital status: Divorced    Spouse name: N/A  . Number of children: N/A  . Years of education: N/A      Occupational History  . Not on file.         Social History Main Topics  . Smoking status: Never Smoker  . Smokeless tobacco: Never Used  . Alcohol use No  . Drug use: No  . Sexual activity: Not Currently    Partners: Male    Birth control/ protection: Surgical     Comment: Tubal       Other Topics Concern  . Not on file      Social History Narrative   Divorced   Lives with son and grand-daughter   [place] (lives in [place])   [place] lives with patient   69- Daughter [name] (lives in [place])   Works at [employer]   Completed some college   Careers adviser            Past Surgical History:  Procedure Laterality Date  . BILATERAL OOPHORECTOMY     Complete removal of left ovary and partial removal of right ovary.  . CESAREAN SECTION  1990   x 1  . COLONOSCOPY WITH PROPOFOL N/A 08/30/2015   Procedure: COLONOSCOPY WITH PROPOFOL;  Surgeon: Mauri Pole, MD;  Location: [facility];  Service: Endoscopy;  Laterality: N/A;  . CYSTOSCOPY N/A 05/23/2015   Procedure: CYSTOSCOPY;  Surgeon: Bobbye Charleston, MD;  Location: [facility];  Service: Gynecology;  Laterality: N/A;  . HERNIA REPAIR    . HYSTEROSCOPY W/D&C N/A 01/11/2015   Procedure: DILATATION AND CURETTAGE /HYSTEROSCOPY;  Surgeon: Jerelyn Charles, MD;  Location: [facility];  Service: Gynecology;  Laterality: N/A;  . KNEE SURGERY     right knee  . ROBOTIC ASSISTED TOTAL HYSTERECTOMY WITH SALPINGECTOMY Bilateral 05/23/2015   Procedure: ROBOTIC ASSISTED TOTAL HYSTERECTOMY WITH SALPINGECTOMYwith right oophorectomy;  Surgeon: Bobbye Charleston, MD;  Location: [facility];  Service: Gynecology;  Laterality: Bilateral;  . TUBAL LIGATION  1990         Family History  Problem Relation Age of Onset  . Lung cancer Mother 75       dec  . Cancer Mother   . Hypertension Mother   . Arthritis Mother   . Cancer Father 38       prostate  . Hypertension Father   . Leukemia Sister   . Hypertension Sister   . Arthritis Sister   . Hyperlipidemia Sister   . Hypertension Brother   . Arthritis Brother   . Multiple sclerosis Sister   . Hypertension Sister   . Hypertension Sister   . Hypertension Brother   . Hypertension Brother   . Breast cancer Paternal Aunt   . Diabetes Maternal Grandmother   . Stroke Maternal Grandmother   . Heart attack Paternal Grandmother          Allergies  Allergen Reactions  . Penicillins Itching    Has patient had a PCN reaction causing immediate rash,  facial/tongue/throat swelling, SOB or lightheadedness with hypotension: No Has patient had a PCN reaction causing severe rash involving mucus membranes or skin necrosis: No Has patient had a PCN reaction that required hospitalization No Has patient had a PCN reaction occurring within the last 10 years: No If all of the above answers are "NO", then may proceed with Cephalosporin use.   . Venlafaxine Other (See Comments)    "felt spacey"          Current Outpatient Prescriptions on File Prior to Visit  Medication Sig Dispense Refill  . aspirin-acetaminophen-caffeine (EXCEDRIN MIGRAINE) 250-250-65 MG per tablet Take 2 tablets by mouth every 6 (six) hours as needed for headache or migraine.    . Cholecalciferol (VITAMIN D3) 3000 units TABS Take 1 tablet by mouth daily. 30 tablet   . FLUoxetine (PROZAC) 20 MG capsule Take 1 capsule (20 mg total) by mouth daily. 30 capsule 5  . furosemide (LASIX) 20 MG tablet Take 1 tablet (20 mg total) by mouth daily as needed. For swelling 30 tablet 3  . lisinopril-hydrochlorothiazide (PRINZIDE,ZESTORETIC) 20-25 MG tablet TAKE 1/2 (ONE-HALF) TABLET BY MOUTH ONCE DAILY 45 tablet 0  . loratadine (CLARITIN) 10 MG tablet Take 1 tablet (10 mg total) by mouth daily. 30 tablet 11  . meloxicam (MOBIC) 7.5 MG tablet Take 1 tablet (7.5 mg total) by mouth daily. 30 tablet 2  . NYSTATIN powder APPLY  POWDER TOPICALLY TO AFFECTED AREA TWICE DAILY AS NEEDED 45 g 1  . potassium chloride SA (K-DUR,KLOR-CON) 20 MEQ tablet Take 1 tablet (20 mEq total) by mouth daily. 30 tablet 3  . venlafaxine XR (EFFEXOR-XR) 37.5 MG 24 hr capsule TAKE ONE CAPSULE BY MOUTH ONCE DAILY FOR 3 DAYS THEN  INCREASE  TO  TWO  CAPSULES  ONCE  DAILY 60 capsule 5  . [DISCONTINUED] LISINOPRIL PO Take by mouth.     No current facility-administered medications on file prior to visit.     BP (!) 152/86 (BP Location: Left Arm) Comment (Cuff Size): thigh  Pulse 72   Temp 98 F (36.7 C) (Oral)    Resp 16   Ht '5\' 4"'  (1.626 m)   Wt (!) 331 lb 9.6 oz (150.4 kg)   LMP 10/02/2007 Comment: but had bleeding 18 months ago for short period of time and began again 08/2013  SpO2 100%   BMI 56.92 kg/m    Objective:   Physical Exam  Constitutional: She appears well-developed and well-nourished.  Cardiovascular: Normal rate, regular rhythm and normal heart sounds.   No murmur heard. Pulmonary/Chest: Effort normal and breath sounds normal. No respiratory distress. She has no wheezes.  Neurological:  Bilateral LE strength is 5/5  Psychiatric: She has a normal mood and affect. Her behavior is normal. Judgment and thought content normal.          Assessment & Plan:  Easy Bruising- check cbc.   Joint pain- check ANA/ESR, RA to evaluated for possible underlying autoimmune etiology.   Urinary frequency- was given abx per gyn today for possible UTI. She is advised to let me know if symptoms do not improve.  Edema- advised pt to try to take lasix daily as tolerated- ok to try taking dose later in the day.

## 2016-08-08 NOTE — Addendum Note (Signed)
Addended by: Debbrah Alar on: 08/08/2016 07:49 AM   Modules accepted: Orders

## 2016-08-09 ENCOUNTER — Encounter: Payer: Self-pay | Admitting: Family

## 2016-08-13 ENCOUNTER — Encounter: Payer: Self-pay | Admitting: Family

## 2016-08-25 ENCOUNTER — Inpatient Hospital Stay (HOSPITAL_COMMUNITY)
Admission: AD | Admit: 2016-08-25 | Discharge: 2016-08-25 | Disposition: A | Discharge status: Home / Self Care | Payer: BLUE CROSS/BLUE SHIELD | Source: Ambulatory Visit | Attending: Obstetrics & Gynecology | Admitting: Obstetrics & Gynecology

## 2016-08-25 DIAGNOSIS — N3001 Acute cystitis with hematuria: Secondary | ICD-10-CM | POA: Diagnosis not present

## 2016-08-25 DIAGNOSIS — N390 Urinary tract infection, site not specified: Secondary | ICD-10-CM

## 2016-08-25 DIAGNOSIS — R319 Hematuria, unspecified: Secondary | ICD-10-CM | POA: Diagnosis present

## 2016-08-25 DIAGNOSIS — B379 Candidiasis, unspecified: Secondary | ICD-10-CM | POA: Diagnosis not present

## 2016-08-25 LAB — URINALYSIS, ROUTINE W REFLEX MICROSCOPIC
Bacteria, UA: NONE SEEN
Bilirubin Urine: NEGATIVE
Glucose, UA: NEGATIVE mg/dL
Ketones, ur: NEGATIVE mg/dL
Nitrite: NEGATIVE
Protein, ur: 100 mg/dL — AB
Specific Gravity, Urine: 1.019 (ref 1.005–1.030)
pH: 5 (ref 5.0–8.0)

## 2016-08-25 MED ORDER — SULFAMETHOXAZOLE-TRIMETHOPRIM 800-160 MG PO TABS: 1.0000 | ORAL_TABLET | Freq: Two times a day (BID) | ORAL | 0 refills | Status: AC

## 2016-08-25 MED ORDER — FLUCONAZOLE 150 MG PO TABS: 150.0000 mg | ORAL_TABLET | Freq: Once | ORAL | 0 refills | Status: AC

## 2016-08-25 MED ORDER — PHENAZOPYRIDINE HCL 200 MG PO TABS: 200.0000 mg | ORAL_TABLET | Freq: Three times a day (TID) | ORAL | 0 refills | Status: DC

## 2016-08-25 NOTE — MAU Provider Note (Signed)
History     CSN: [id number]  Arrival date and time: 08/25/16 1739   None     Chief Complaint  Patient presents with  . Hematuria   HPI   Ms.Hailey Vance is a 51 y.o. female [phone] here with hematuria. She first noticed it this morning. She was recently treated for a UTI in the office, she started antibiotics on 6/6, she took 2 pills per day for 7 days. She feels that the dysuria improved however now she has pain and she is seeing blood. States this last UTI was the first one she had in 38 years.   OB History    Gravida Para Term Preterm AB Living   3 3 3     3    SAB TAB Ectopic Multiple Live Births                  Past Medical History:  Diagnosis Date  . Anxiety   . Arthritis    osteoarthritis - knees  . Blood transfusion 1990   In [place], [place] with C/S Surgery - 5 units transfused  . Blood transfusion without reported diagnosis 1990   after a c/section  . Chest pain   . Chronic back pain   . Depression   . Fibroid   . Headache(784.0)    otc med prn  . Hypertension   . Low blood potassium   . Plantar fasciitis of left foot   . Sleep apnea    CPAP  . Vitamin D deficiency 09/06/2014    Past Surgical History:  Procedure Laterality Date  . BILATERAL OOPHORECTOMY     Complete removal of left ovary and partial removal of right ovary.  . CESAREAN SECTION  1990   x 1  . COLONOSCOPY WITH PROPOFOL N/A 08/30/2015   Procedure: COLONOSCOPY WITH PROPOFOL;  Surgeon: Mauri Pole, MD;  Location: [facility];  Service: Endoscopy;  Laterality: N/A;  . CYSTOSCOPY N/A 05/23/2015   Procedure: CYSTOSCOPY;  Surgeon: Bobbye Charleston, MD;  Location: [facility];  Service: Gynecology;  Laterality: N/A;  . HERNIA REPAIR    . HYSTEROSCOPY W/D&C N/A 01/11/2015   Procedure: DILATATION AND CURETTAGE /HYSTEROSCOPY;  Surgeon: Jerelyn Charles, MD;  Location: [facility];  Service: Gynecology;  Laterality: N/A;  . KNEE SURGERY     right knee  . ROBOTIC ASSISTED TOTAL HYSTERECTOMY WITH  SALPINGECTOMY Bilateral 05/23/2015   Procedure: ROBOTIC ASSISTED TOTAL HYSTERECTOMY WITH SALPINGECTOMYwith right oophorectomy;  Surgeon: Bobbye Charleston, MD;  Location: [facility];  Service: Gynecology;  Laterality: Bilateral;  . TUBAL LIGATION  1990    Family History  Problem Relation Age of Onset  . Lung cancer Mother 3       dec  . Cancer Mother   . Hypertension Mother   . Arthritis Mother   . Cancer Father 74       prostate  . Hypertension Father   . Leukemia Sister   . Hypertension Sister   . Arthritis Sister   . Hyperlipidemia Sister   . Hypertension Brother   . Arthritis Brother   . Multiple sclerosis Sister   . Hypertension Sister   . Hypertension Sister   . Hypertension Brother   . Hypertension Brother   . Breast cancer Paternal Aunt   . Diabetes Maternal Grandmother   . Stroke Maternal Grandmother   . Heart attack Paternal Grandmother     Social History  Substance Use Topics  . Smoking status: Never Smoker  . Smokeless tobacco: Never Used  .  Alcohol use No    Allergies:  Allergies  Allergen Reactions  . Penicillins Itching    Has patient had a PCN reaction causing immediate rash, facial/tongue/throat swelling, SOB or lightheadedness with hypotension: No Has patient had a PCN reaction causing severe rash involving mucus membranes or skin necrosis: No Has patient had a PCN reaction that required hospitalization No Has patient had a PCN reaction occurring within the last 10 years: No If all of the above answers are "NO", then may proceed with Cephalosporin use.   . Venlafaxine Other (See Comments)    "felt spacey"    Prescriptions Prior to Admission  Medication Sig Dispense Refill Last Dose  . aspirin-acetaminophen-caffeine (EXCEDRIN MIGRAINE) 250-250-65 MG per tablet Take 2 tablets by mouth every 6 (six) hours as needed for headache or migraine.   Taking  . Cholecalciferol (VITAMIN D3) 3000 units TABS Take 1 tablet by mouth daily. 30 tablet  Taking  .  FLUoxetine (PROZAC) 20 MG capsule Take 1 capsule (20 mg total) by mouth daily. 30 capsule 5 Taking  . furosemide (LASIX) 20 MG tablet Take 1 tablet (20 mg total) by mouth daily as needed. For swelling 30 tablet 3 Taking  . lisinopril-hydrochlorothiazide (PRINZIDE,ZESTORETIC) 20-25 MG tablet TAKE 1/2 (ONE-HALF) TABLET BY MOUTH ONCE DAILY 45 tablet 0 Taking  . loratadine (CLARITIN) 10 MG tablet Take 1 tablet (10 mg total) by mouth daily. 30 tablet 11 Taking  . meloxicam (MOBIC) 7.5 MG tablet Take 1 tablet (7.5 mg total) by mouth daily. 30 tablet 2 Taking  . NYSTATIN powder APPLY  POWDER TOPICALLY TO AFFECTED AREA TWICE DAILY AS NEEDED 45 g 1 Taking  . potassium chloride SA (K-DUR,KLOR-CON) 20 MEQ tablet Take 1 tablet (20 mEq total) by mouth daily. 30 tablet 3 Taking  . venlafaxine XR (EFFEXOR-XR) 37.5 MG 24 hr capsule TAKE ONE CAPSULE BY MOUTH ONCE DAILY FOR 3 DAYS THEN  INCREASE  TO  TWO  CAPSULES  ONCE  DAILY 60 capsule 5 Taking   Results for orders placed or performed during the hospital encounter of 08/25/16 (from the past 48 hour(s))  Urinalysis, Routine w reflex microscopic     Status: Abnormal   Collection Time: 08/25/16  6:10 PM  Result Value Ref Range   Color, Urine YELLOW YELLOW   APPearance HAZY (A) CLEAR   Specific Gravity, Urine 1.019 1.005 - 1.030   pH 5.0 5.0 - 8.0   Glucose, UA NEGATIVE NEGATIVE mg/dL   Hgb urine dipstick LARGE (A) NEGATIVE   Bilirubin Urine NEGATIVE NEGATIVE   Ketones, ur NEGATIVE NEGATIVE mg/dL   Protein, ur 100 (A) NEGATIVE mg/dL   Nitrite NEGATIVE NEGATIVE   Leukocytes, UA MODERATE (A) NEGATIVE   RBC / HPF TOO NUMEROUS TO COUNT 0 - 5 RBC/hpf   WBC, UA TOO NUMEROUS TO COUNT 0 - 5 WBC/hpf   Bacteria, UA NONE SEEN NONE SEEN   Squamous Epithelial / LPF 0-5 (A) NONE SEEN   Budding Yeast PRESENT    Review of Systems  Constitutional: Negative for fever.  Genitourinary: Positive for dysuria, frequency and urgency.  Musculoskeletal: Positive for back pain  (Lower back pain. ).   Physical Exam   Blood pressure (!) 153/96, pulse 87, temperature 97.9 F (36.6 C), resp. rate 16, weight (!) 326 lb (147.9 kg), last menstrual period 10/02/2007.  Physical Exam  Constitutional: She is oriented to person, place, and time. She appears well-developed and well-nourished. No distress.  HENT:  Head: Normocephalic.  Musculoskeletal: Normal range of  motion.  Neurological: She is alert and oriented to person, place, and time.  Skin: Skin is warm. She is not diaphoretic.  Psychiatric: Her behavior is normal.   MAU Course  Procedures  none  MDM  CBC  Orthostatic vitals Urine culture sent due to recent treatment with resolution of symptoms.  Discussed with Dr. Alwyn Pea.  [place] for discharge.   Assessment and Plan   A:  1. Hematuria due to acute cystitis   2. Acute UTI   3. Yeast infection     P:  Discharge home in stable condition Follow up in the office this week Urine culture pending Rx: Diflucan, Keflex, pyridium  Return to MAU if symptoms worsen    Rasch, Artist Pais, NP 08/25/2016 7:57 PM

## 2016-08-25 NOTE — Discharge Instructions (Signed)

## 2016-08-25 NOTE — MAU Note (Signed)
Pt presents to MAU with complaints of blood in her urine with painful urination. PT states she was treated for  UTI a week ago and seemed ok but she is experiencing the same symptoms again

## 2016-08-27 LAB — CULTURE, OB URINE: Special Requests: NORMAL

## 2016-08-29 ENCOUNTER — Encounter: Payer: Self-pay | Admitting: Family

## 2016-09-05 ENCOUNTER — Other Ambulatory Visit: Payer: Self-pay | Admitting: Family

## 2016-09-10 ENCOUNTER — Encounter: Payer: Self-pay | Admitting: Family

## 2016-09-10 ENCOUNTER — Ambulatory Visit (INDEPENDENT_AMBULATORY_CARE_PROVIDER_SITE_OTHER): Payer: BLUE CROSS/BLUE SHIELD | Admitting: Family

## 2016-09-10 VITALS — BP 115/83 | HR 74 | Temp 98.2°F | Resp 18 | Ht 64.0 in | Wt 326.6 lb

## 2016-09-10 DIAGNOSIS — G4733 Obstructive sleep apnea (adult) (pediatric): Secondary | ICD-10-CM

## 2016-09-10 DIAGNOSIS — M25562 Pain in left knee: Secondary | ICD-10-CM | POA: Diagnosis not present

## 2016-09-10 DIAGNOSIS — M25561 Pain in right knee: Secondary | ICD-10-CM

## 2016-09-10 DIAGNOSIS — R609 Edema, unspecified: Secondary | ICD-10-CM

## 2016-09-10 DIAGNOSIS — R319 Hematuria, unspecified: Secondary | ICD-10-CM

## 2016-09-10 LAB — URINALYSIS, ROUTINE W REFLEX MICROSCOPIC
BILIRUBIN URINE: NEGATIVE
HGB URINE DIPSTICK: NEGATIVE
Ketones, ur: NEGATIVE
Leukocytes, UA: NEGATIVE
NITRITE: NEGATIVE
RBC / HPF: NONE SEEN (ref 0–?)
Specific Gravity, Urine: >=1.03 — AB (ref 1.000–1.030)
Total Protein, Urine: NEGATIVE
Urine Glucose: NEGATIVE
Urobilinogen, UA: 0.2 (ref 0.0–1.0)
pH: 5.5 (ref 5.0–8.0)

## 2016-09-10 LAB — BASIC METABOLIC PANEL
BUN: 20 mg/dL (ref 6–23)
CHLORIDE: 103 meq/L (ref 96–112)
CO2: 27 mEq/L (ref 19–32)
Calcium: 9.4 mg/dL (ref 8.4–10.5)
Creatinine, Ser: 1 mg/dL (ref 0.40–1.20)
GFR: 74.71 mL/min (ref >60.00)
Glucose, Bld: 107 mg/dL — ABNORMAL HIGH (ref 70–99)
POTASSIUM: 3.6 meq/L (ref 3.5–5.1)
Sodium: 137 mEq/L (ref 135–145)

## 2016-09-10 NOTE — Patient Instructions (Addendum)
Please call with our pulmonology at the following number to schedule your consult for sleep apnea:  [phone].  You will be contacted about your referral to ortho for your knee pain.

## 2016-09-10 NOTE — Progress Notes (Signed)
Subjective:    Patient ID: Hailey Vance, female    DOB: 07-16-1965, 51 y.o.   MRN: [id number]  HPI  Patient presents today for follow-up. She was evaluated in the emergency department on June 25 with hematuria. ER record is reviewed. There are no indicated that they felt her symptoms were secondary to acute cystitis. She was also treated for urinary tract infection. She was sent home with Diflucan, Keflex, and Pyridium. Urine culture grew multiple species. Reports that her symptoms are resolved. No further visible blood in her urine.  She sees Dr. Philis Pique.    Obstructive sleep apnea-a referral was placed for home sleep study on 08/06/2016. Her insurance did not approve the sleep study. A referral was made to pulmonary/sleep medicine for further evaluation. They attempted to reach the patient multiple times according to the note but did not receive a call back.  Edema- last visit she noted edema. I advised her to try taking Lasix daily as tolerated.  She also reported that she been having some falls out of bed at night. She reports that she has not fallen out of bed since her last visit.  She is having daily AM headaches.    Bilateral knee pain- Reports that she has seen or so in the remote past. She cannot remember who she saw. Reports that her knee pain has worsened recently she would like to return to orthopedics.  Review of Systems See HPI  Past Medical History:  Diagnosis Date  . Anxiety   . Arthritis    osteoarthritis - knees  . Blood transfusion 1990   In [place], [place] with C/S Surgery - 5 units transfused  . Blood transfusion without reported diagnosis 1990   after a c/section  . Chest pain   . Chronic back pain   . Depression   . Fibroid   . Headache(784.0)    otc med prn  . Hypertension   . Low blood potassium   . Plantar fasciitis of left foot   . Sleep apnea    CPAP  . Vitamin D deficiency 09/06/2014     Social History   Social History  . Marital status:  Divorced    Spouse name: N/A  . Number of children: N/A  . Years of education: N/A   Occupational History  . Not on file.   Social History Main Topics  . Smoking status: Never Smoker  . Smokeless tobacco: Never Used  . Alcohol use No  . Drug use: No  . Sexual activity: Not Currently    Partners: Male    Birth control/ protection: Surgical     Comment: Tubal   Other Topics Concern  . Not on file   Social History Narrative   Divorced   Lives with son and grand-daughter   [place] (lives in [place])   [place] lives with patient   46- Daughter [name] (lives in [place])   Works at [employer]   Completed some college   Careers adviser       Past Surgical History:  Procedure Laterality Date  . BILATERAL OOPHORECTOMY     Complete removal of left ovary and partial removal of right ovary.  . CESAREAN SECTION  1990   x 1  . COLONOSCOPY WITH PROPOFOL N/A 08/30/2015   Procedure: COLONOSCOPY WITH PROPOFOL;  Surgeon: Mauri Pole, MD;  Location: [facility];  Service: Endoscopy;  Laterality: N/A;  . CYSTOSCOPY N/A 05/23/2015   Procedure: CYSTOSCOPY;  Surgeon: Bobbye Charleston, MD;  Location: [facility];  Service: Gynecology;  Laterality: N/A;  . HERNIA REPAIR    . HYSTEROSCOPY W/D&C N/A 01/11/2015   Procedure: DILATATION AND CURETTAGE /HYSTEROSCOPY;  Surgeon: Jerelyn Charles, MD;  Location: [facility];  Service: Gynecology;  Laterality: N/A;  . KNEE SURGERY     right knee  . ROBOTIC ASSISTED TOTAL HYSTERECTOMY WITH SALPINGECTOMY Bilateral 05/23/2015   Procedure: ROBOTIC ASSISTED TOTAL HYSTERECTOMY WITH SALPINGECTOMYwith right oophorectomy;  Surgeon: Bobbye Charleston, MD;  Location: [facility];  Service: Gynecology;  Laterality: Bilateral;  . TUBAL LIGATION  1990    Family History  Problem Relation Age of Onset  . Lung cancer Mother 36       dec  . Cancer Mother   . Hypertension Mother   . Arthritis Mother   . Cancer Father 72        prostate  . Hypertension Father   . Leukemia Sister   . Hypertension Sister   . Arthritis Sister   . Hyperlipidemia Sister   . Hypertension Brother   . Arthritis Brother   . Multiple sclerosis Sister   . Hypertension Sister   . Hypertension Sister   . Hypertension Brother   . Hypertension Brother   . Breast cancer Paternal Aunt   . Diabetes Maternal Grandmother   . Stroke Maternal Grandmother   . Heart attack Paternal Grandmother     Allergies  Allergen Reactions  . Penicillins Itching    Has patient had a PCN reaction causing immediate rash, facial/tongue/throat swelling, SOB or lightheadedness with hypotension: No Has patient had a PCN reaction causing severe rash involving mucus membranes or skin necrosis: No Has patient had a PCN reaction that required hospitalization No Has patient had a PCN reaction occurring within the last 10 years: No If all of the above answers are "NO", then may proceed with Cephalosporin use.   . Venlafaxine Other (See Comments)    "felt spacey"    Current Outpatient Prescriptions on File Prior to Visit  Medication Sig Dispense Refill  . aspirin-acetaminophen-caffeine (EXCEDRIN MIGRAINE) 250-250-65 MG per tablet Take 2 tablets by mouth every 6 (six) hours as needed for headache or migraine.    . Cholecalciferol (VITAMIN D3) 3000 units TABS Take 1 tablet by mouth daily. 30 tablet   . FLUoxetine (PROZAC) 20 MG capsule Take 1 capsule (20 mg total) by mouth daily. 30 capsule 5  . furosemide (LASIX) 20 MG tablet Take 1 tablet (20 mg total) by mouth daily as needed. For swelling 30 tablet 3  . lisinopril-hydrochlorothiazide (PRINZIDE,ZESTORETIC) 20-25 MG tablet TAKE 1/2 (ONE-HALF) TABLET BY MOUTH ONCE DAILY 45 tablet 0  . loratadine (CLARITIN) 10 MG tablet Take 1 tablet (10 mg total) by mouth daily. 30 tablet 11  . meloxicam (MOBIC) 7.5 MG tablet TAKE ONE TABLET BY MOUTH ONCE DAILY 30 tablet 2  . NYSTATIN powder APPLY  POWDER TOPICALLY TO AFFECTED AREA  TWICE DAILY AS NEEDED 45 g 1  . potassium chloride SA (K-DUR,KLOR-CON) 20 MEQ tablet Take 1 tablet (20 mEq total) by mouth daily. 30 tablet 3  . venlafaxine XR (EFFEXOR-XR) 37.5 MG 24 hr capsule TAKE ONE CAPSULE BY MOUTH ONCE DAILY FOR 3 DAYS THEN  INCREASE  TO  TWO  CAPSULES  ONCE  DAILY 60 capsule 5  . [DISCONTINUED] LISINOPRIL PO Take by mouth.     No current facility-administered medications on file prior to visit.     Pulse 78   Temp 98.2 F (36.8 C) (  Oral)   Resp 18   Ht 5\' 4"  (1.626 m)   Wt (!) 326 lb 9.6 oz (148.1 kg)   LMP 10/02/2007 Comment: but had bleeding 18 months ago for short period of time and began again 08/2013  SpO2 97%   BMI 56.06 kg/m       Objective:   Physical Exam  Constitutional: She is oriented to person, place, and time. She appears well-developed and well-nourished.  HENT:  Head: Normocephalic and atraumatic.  Cardiovascular: Normal rate, regular rhythm and normal heart sounds.   No murmur heard. Pulmonary/Chest: Effort normal and breath sounds normal. No respiratory distress. She has no wheezes.  Musculoskeletal:  3+ bilateral lower extremity  Neurological: She is alert and oriented to person, place, and time.  Psychiatric: She has a normal mood and affect. Her behavior is normal. Judgment and thought content normal.          Assessment & Plan:  Hematuria- New. status post treatment for UTI follow-up urinalysis is negative for red blood cells.  Obstructive sleep apnea-I have advised her to contact Dr. Lavone Orn office to schedule her consult for sleep apnea. I believe that her untreated sleep apnea is contributing to her  fatigue, daily headaches, as well as her lower extremity edema.  Bilateral knee pain-will refer to orthopedics for further evaluation. We also discussed the importance of weight loss in helping her knee pain.

## 2016-09-11 LAB — URINE CULTURE: Organism ID, Bacteria: NO GROWTH

## 2016-09-18 ENCOUNTER — Other Ambulatory Visit: Payer: Self-pay | Admitting: Family

## 2016-09-20 ENCOUNTER — Emergency Department (HOSPITAL_COMMUNITY)
Admission: EM | Admit: 2016-09-20 | Discharge: 2016-09-20 | Disposition: A | Discharge status: Home / Self Care | Payer: BLUE CROSS/BLUE SHIELD | Attending: Emergency Medicine | Admitting: Emergency Medicine

## 2016-09-20 ENCOUNTER — Encounter (HOSPITAL_COMMUNITY): Payer: Self-pay | Admitting: Emergency Medicine

## 2016-09-20 DIAGNOSIS — H5711 Ocular pain, right eye: Secondary | ICD-10-CM | POA: Diagnosis present

## 2016-09-20 DIAGNOSIS — H1031 Unspecified acute conjunctivitis, right eye: Secondary | ICD-10-CM | POA: Diagnosis not present

## 2016-09-20 DIAGNOSIS — F329 Major depressive disorder, single episode, unspecified: Secondary | ICD-10-CM | POA: Insufficient documentation

## 2016-09-20 DIAGNOSIS — I10 Essential (primary) hypertension: Secondary | ICD-10-CM | POA: Diagnosis not present

## 2016-09-20 DIAGNOSIS — Z79899 Other long term (current) drug therapy: Secondary | ICD-10-CM | POA: Diagnosis not present

## 2016-09-20 DIAGNOSIS — F419 Anxiety disorder, unspecified: Secondary | ICD-10-CM | POA: Insufficient documentation

## 2016-09-20 DIAGNOSIS — H109 Unspecified conjunctivitis: Secondary | ICD-10-CM

## 2016-09-20 MED ORDER — ERYTHROMYCIN 5 MG/GM OP OINT: TOPICAL_OINTMENT | OPHTHALMIC | 0 refills | Status: DC

## 2016-09-20 MED ORDER — FLUORESCEIN SODIUM 0.6 MG OP STRP
1.0000 | ORAL_STRIP | Freq: Once | OPHTHALMIC | Status: DC
  Filled 2016-09-20: qty 1

## 2016-09-20 MED ORDER — TETRACAINE HCL 0.5 % OP SOLN
1.0000 [drp] | Freq: Once | OPHTHALMIC | Status: DC
  Filled 2016-09-20: qty 4

## 2016-09-20 NOTE — ED Triage Notes (Signed)
Patient reports that she has had itching and burning to her right eye.  Patient states that she noticed redness and discharge today.  No meds PTA.  NAD noted.

## 2016-09-20 NOTE — Discharge Instructions (Signed)
Apply your antibiotic eye ointment as prescribed for the next 5 days. I recommend washing her hands with warm water and antibacterial soap anytime you touch her eye or clean around her eye to prevent spread of infection. Follow-up with the ophthalmology clinic listed below if your symptoms are not improved over the next 4-5 days. Please return to the Emergency Department if symptoms worsen or new onset of fever, facial swelling, loss of vision.

## 2016-09-20 NOTE — ED Provider Notes (Signed)
[facility] DEPT Provider Note   CSN: [id number] Arrival date & time: 09/20/16  2053  By signing my name below, I, Ny'Kea Lewis, attest that this documentation has been prepared under the direction and in the presence of Harlene Ramus, PA-C. Electronically Signed: Lise Auer, ED Scribe. 09/20/16. 9:58 PM.  History   Chief Complaint Chief Complaint  Patient presents with  . Conjunctivitis   The history is provided by the patient. No language interpreter was used.    HPI Comments: Hailey Vance is a 51 y.o. female with a history of anxiety, depression, and hypertension, who presents to the Emergency Department complaining of sudden onset, persistent right eye pain that began yesterday. She notes associated right eye itching, burning sensation, discharge, and redness. Pt reports this morning when she awoke there was an "eye booger with green stuff" in the inner corner of her right eye. Pt's grandson was seen in ED prior to her visit with the same symptoms and she states other parties in the home have the same symptom. She does not wear contacts or glasses. Denies fever, facial swelling, visual changes, chills, or rhinorrhea.  Past Medical History:  Diagnosis Date  . Anxiety   . Arthritis    osteoarthritis - knees  . Blood transfusion 1990   In [place], [place] with C/S Surgery - 5 units transfused  . Blood transfusion without reported diagnosis 1990   after a c/section  . Chest pain   . Chronic back pain   . Depression   . Fibroid   . Headache(784.0)    otc med prn  . Hypertension   . Low blood potassium   . Plantar fasciitis of left foot   . Sleep apnea    CPAP  . Vitamin D deficiency 09/06/2014    Patient Active Problem List   Diagnosis Date Noted  . BMI 50.0-59.9, adult ([place])   . Preventative health care 10/10/2014  . HTN (hypertension) 09/06/2014  . Osteoarthritis 09/06/2014  . Post-menopausal bleeding 09/06/2014  . OSA (obstructive sleep apnea) 09/06/2014  . Anxiety  and depression 09/06/2014  . Vitamin D deficiency 09/06/2014  . Chronic back pain     Past Surgical History:  Procedure Laterality Date  . BILATERAL OOPHORECTOMY     Complete removal of left ovary and partial removal of right ovary.  . CESAREAN SECTION  1990   x 1  . COLONOSCOPY WITH PROPOFOL N/A 08/30/2015   Procedure: COLONOSCOPY WITH PROPOFOL;  Surgeon: Mauri Pole, MD;  Location: [facility];  Service: Endoscopy;  Laterality: N/A;  . CYSTOSCOPY N/A 05/23/2015   Procedure: CYSTOSCOPY;  Surgeon: Bobbye Charleston, MD;  Location: [facility];  Service: Gynecology;  Laterality: N/A;  . HERNIA REPAIR    . HYSTEROSCOPY W/D&C N/A 01/11/2015   Procedure: DILATATION AND CURETTAGE /HYSTEROSCOPY;  Surgeon: Jerelyn Charles, MD;  Location: [facility];  Service: Gynecology;  Laterality: N/A;  . KNEE SURGERY     right knee  . ROBOTIC ASSISTED TOTAL HYSTERECTOMY WITH SALPINGECTOMY Bilateral 05/23/2015   Procedure: ROBOTIC ASSISTED TOTAL HYSTERECTOMY WITH SALPINGECTOMYwith right oophorectomy;  Surgeon: Bobbye Charleston, MD;  Location: [facility];  Service: Gynecology;  Laterality: Bilateral;  . TUBAL LIGATION  1990    OB History    Gravida Para Term Preterm AB Living   3 3 3     3    SAB TAB Ectopic Multiple Live Births                 Home Medications    Prior  to Admission medications   Medication Sig Start Date End Date Taking? Authorizing Provider  aspirin-acetaminophen-caffeine (EXCEDRIN MIGRAINE) [phone] MG per tablet Take 2 tablets by mouth every 6 (six) hours as needed for headache or migraine.    [provider]  Cholecalciferol (VITAMIN D3) 3000 units TABS Take 1 tablet by mouth daily. 08/03/15   Debbrah Alar, NP  erythromycin ophthalmic ointment Place a 1/2 inch ribbon of ointment into the lower eyelid 4 times daily for 5 days. 09/20/16   Nona Dell, PA-C  FLUoxetine (PROZAC) 20 MG capsule TAKE ONE CAPSULE BY MOUTH ONCE DAILY 09/19/16   Debbrah Alar, NP    furosemide (LASIX) 20 MG tablet Take 1 tablet (20 mg total) by mouth daily as needed. For swelling 03/26/16   Debbrah Alar, NP  lisinopril-hydrochlorothiazide (PRINZIDE,ZESTORETIC) 20-25 MG tablet TAKE 1/2 (ONE-HALF) TABLET BY MOUTH ONCE DAILY 09/19/16   Debbrah Alar, NP  loratadine (CLARITIN) 10 MG tablet Take 1 tablet (10 mg total) by mouth daily. 09/11/15   Debbrah Alar, NP  meloxicam (MOBIC) 7.5 MG tablet TAKE ONE TABLET BY MOUTH ONCE DAILY 09/07/16   Debbrah Alar, NP  NYSTATIN powder APPLY  POWDER TOPICALLY TO AFFECTED AREA TWICE DAILY AS NEEDED 07/23/16   Debbrah Alar, NP  potassium chloride SA (K-DUR,KLOR-CON) 20 MEQ tablet Take 1 tablet (20 mEq total) by mouth daily. 12/21/15   Debbrah Alar, NP  venlafaxine XR (EFFEXOR-XR) 37.5 MG 24 hr capsule TAKE ONE CAPSULE BY MOUTH ONCE DAILY FOR 3 DAYS THEN TAKE TWO  CAPSULES BY MOUTH ONCE DAILY 09/19/16   Debbrah Alar, NP   Family History Family History  Problem Relation Age of Onset  . Lung cancer Mother 53       dec  . Cancer Mother   . Hypertension Mother   . Arthritis Mother   . Cancer Father 61       prostate  . Hypertension Father   . Leukemia Sister   . Hypertension Sister   . Arthritis Sister   . Hyperlipidemia Sister   . Hypertension Brother   . Arthritis Brother   . Multiple sclerosis Sister   . Hypertension Sister   . Hypertension Sister   . Hypertension Brother   . Hypertension Brother   . Breast cancer Paternal Aunt   . Diabetes Maternal Grandmother   . Stroke Maternal Grandmother   . Heart attack Paternal Grandmother     Social History Social History  Substance Use Topics  . Smoking status: Never Smoker  . Smokeless tobacco: Never Used  . Alcohol use No   Allergies   Penicillins and Venlafaxine  Review of Systems Review of Systems  Constitutional: Negative for chills and fever.  HENT: Negative for rhinorrhea.   Eyes: Positive for pain, discharge, redness and  itching.   Physical Exam Updated Vital Signs BP 113/83 (BP Location: Left Arm)   Pulse 86   Temp 98.7 F (37.1 C) (Oral)   Resp 16   Wt (!) 152 kg (335 lb)   LMP 10/02/2007 Comment: but had bleeding 18 months ago for short period of time and began again 08/2013  SpO2 95%   BMI 57.50 kg/m   Physical Exam  Constitutional: She is oriented to person, place, and time. She appears well-developed and well-nourished. No distress.  HENT:  Head: Normocephalic and atraumatic.  Mouth/Throat: Oropharynx is clear and moist. No oropharyngeal exudate.  Eyes: Pupils are equal, round, and reactive to light. EOM and lids are normal. Lids are everted and  swept, no foreign bodies found. Right eye exhibits discharge. Right eye exhibits no chemosis, no exudate and no hordeolum. No foreign body present in the right eye. Left eye exhibits no discharge. Right conjunctiva is injected. Right conjunctiva has no hemorrhage. No scleral icterus.  Neck: Normal range of motion. Neck supple.  Cardiovascular: Normal rate.   Pulmonary/Chest: Effort normal.  Neurological: She is alert and oriented to person, place, and time.  Skin: Skin is warm and dry. She is not diaphoretic.  Nursing note and vitals reviewed.    ED Treatments / Results  DIAGNOSTIC STUDIES: Oxygen Saturation is 95% on RA, adequate by my interpretation.   COORDINATION OF CARE: 9:58 PM-Discussed next steps with pt. Pt verbalized understanding and is agreeable with the plan.   Labs (all labs ordered are listed, but only abnormal results are displayed) Labs Reviewed - No data to display  EKG  EKG Interpretation None       Radiology No results found.  Procedures Procedures (including critical care time)  Medications Ordered in ED Medications - No data to display   Initial Impression / Assessment and Plan / ED Course  I have reviewed the triage vital signs and the nursing notes.  Pertinent labs & imaging results that were  available during my care of the patient were reviewed by me and considered in my medical decision making (see chart for details).     Patient presentation consistent with bacterial conjunctivitis. Patient reports multiple other family members having similar symptoms over the past week and notes her grieving kids were recently prescribed antibiotics for pinkeye. Denies fever, facial swelling, visual changes. Denies use of contacts.  Pt rx erythromycin ophthalmic ointment, discussed symptomatic tx. Personal hygiene and frequent handwashing discussed.  Patient advised to followup with ophthalmologist if symptoms persist or worsen in any way including vision change or purulent discharge.  Patient verbalizes understanding and is agreeable with discharge.   Final Clinical Impressions(s) / ED Diagnoses   Final diagnoses:  Bacterial conjunctivitis of right eye   New Prescriptions New Prescriptions   ERYTHROMYCIN OPHTHALMIC OINTMENT    Place a 1/2 inch ribbon of ointment into the lower eyelid 4 times daily for 5 days.   I personally performed the services described in this documentation, which was scribed in my presence. The recorded information has been reviewed and is accurate.     Nona Dell, PA-C 09/20/16 2207    Alfonzo Beers, MD 09/20/16 2237

## 2016-09-22 ENCOUNTER — Telehealth: Payer: Self-pay | Admitting: Family

## 2016-09-22 NOTE — Telephone Encounter (Signed)
Pt dropped off document to be filled out Development worker, international aid of Disability Parking Placard). Pt would like to be called when ready to pick up at [facility] [phone]. Document put at front office tray.

## 2016-09-23 ENCOUNTER — Ambulatory Visit (INDEPENDENT_AMBULATORY_CARE_PROVIDER_SITE_OTHER): Payer: BLUE CROSS/BLUE SHIELD | Admitting: Family Medicine

## 2016-09-23 ENCOUNTER — Encounter: Payer: Self-pay | Admitting: Family Medicine

## 2016-09-23 VITALS — BP 122/80 | HR 88 | Temp 99.2°F | Resp 16 | Ht 64.0 in | Wt 329.4 lb

## 2016-09-23 DIAGNOSIS — H109 Unspecified conjunctivitis: Secondary | ICD-10-CM | POA: Diagnosis not present

## 2016-09-23 DIAGNOSIS — B9689 Other specified bacterial agents as the cause of diseases classified elsewhere: Secondary | ICD-10-CM

## 2016-09-23 MED ORDER — MOXIFLOXACIN HCL 0.5 % OP SOLN: 1.0000 [drp] | Freq: Three times a day (TID) | OPHTHALMIC | 0 refills | Status: DC

## 2016-09-23 NOTE — Telephone Encounter (Signed)
Received documentation sheet attached to [employer] [place] stating that patient needs "Intermittent FMLA for the excessive swelling in legs & feet" and that she had missed [2] days of work "because she couldn't get any shoe on her feet". Patient is scheduled to see Dr. Carollee Herter today at 6:00pm for "right eye hurting, red & swollen"; patient was seen in ED on 09/20/16 for Bacterial Conjunctivitis of Right eye. We need clarification on today's appointment, just to be clear for provider as to what patient expects to be seen for this evening, and also; patient needs to understand that it is her responsibility to contact her employer HR office to have FMLA paperwork forwarded to our office for her missed time from work d/t swelling in legs & feet.Could you please contact patient and verify that she is aware of these things. Thanks. Will ask covering provider [Dr. Carollee Herter if she will sign off on application for parking placard/SLS 07/24

## 2016-09-23 NOTE — Progress Notes (Signed)
Patient ID: Hailey Vance, female   DOB: August 02, 1965, 51 y.o.   MRN: [id number]     Subjective:  I acted as a Education administrator for Dr. Carollee Herter.  Guerry Bruin, [place]   Patient ID: Hailey Vance, female    DOB: August 05, 1965, 51 y.o.   MRN: [id number]  Chief Complaint  Patient presents with  . Eye Pain    HPI  Patient is in today for follow up from ER.  She was seen for bacterial conjunctivitis of her right eye.  She has been using erythromycin ointment.    Patient Care Team: Debbrah Alar, NP as PCP - General (Internal Medicine) Kary Kos, MD as Consulting Physician (Neurosurgery) Nunzio Cobbs, MD as Consulting Physician (Obstetrics and Gynecology)   Past Medical History:  Diagnosis Date  . Anxiety   . Arthritis    osteoarthritis - knees  . Blood transfusion 1990   In [place], [place] with C/S Surgery - 5 units transfused  . Blood transfusion without reported diagnosis 1990   after a c/section  . Chest pain   . Chronic back pain   . Depression   . Fibroid   . Headache(784.0)    otc med prn  . Hypertension   . Low blood potassium   . Plantar fasciitis of left foot   . Sleep apnea    CPAP  . Vitamin D deficiency 09/06/2014    Past Surgical History:  Procedure Laterality Date  . BILATERAL OOPHORECTOMY     Complete removal of left ovary and partial removal of right ovary.  . CESAREAN SECTION  1990   x 1  . COLONOSCOPY WITH PROPOFOL N/A 08/30/2015   Procedure: COLONOSCOPY WITH PROPOFOL;  Surgeon: Mauri Pole, MD;  Location: [facility];  Service: Endoscopy;  Laterality: N/A;  . CYSTOSCOPY N/A 05/23/2015   Procedure: CYSTOSCOPY;  Surgeon: Bobbye Charleston, MD;  Location: [facility];  Service: Gynecology;  Laterality: N/A;  . HERNIA REPAIR    . HYSTEROSCOPY W/D&C N/A 01/11/2015   Procedure: DILATATION AND CURETTAGE /HYSTEROSCOPY;  Surgeon: Jerelyn Charles, MD;  Location: [facility];  Service: Gynecology;  Laterality: N/A;  . KNEE SURGERY     right knee  . ROBOTIC ASSISTED  TOTAL HYSTERECTOMY WITH SALPINGECTOMY Bilateral 05/23/2015   Procedure: ROBOTIC ASSISTED TOTAL HYSTERECTOMY WITH SALPINGECTOMYwith right oophorectomy;  Surgeon: Bobbye Charleston, MD;  Location: [facility];  Service: Gynecology;  Laterality: Bilateral;  . TUBAL LIGATION  1990    Family History  Problem Relation Age of Onset  . Lung cancer Mother 59       dec  . Cancer Mother   . Hypertension Mother   . Arthritis Mother   . Cancer Father 40       prostate  . Hypertension Father   . Leukemia Sister   . Hypertension Sister   . Arthritis Sister   . Hyperlipidemia Sister   . Hypertension Brother   . Arthritis Brother   . Multiple sclerosis Sister   . Hypertension Sister   . Hypertension Sister   . Hypertension Brother   . Hypertension Brother   . Breast cancer Paternal Aunt   . Diabetes Maternal Grandmother   . Stroke Maternal Grandmother   . Heart attack Paternal Grandmother     Social History   Social History  . Marital status: Divorced    Spouse name: N/A  . Number of children: N/A  . Years of education: N/A   Occupational History  . Not on file.   Social History  Main Topics  . Smoking status: Never Smoker  . Smokeless tobacco: Never Used  . Alcohol use No  . Drug use: No  . Sexual activity: Not Currently    Partners: Male    Birth control/ protection: Surgical     Comment: Tubal   Other Topics Concern  . Not on file   Social History Narrative   Divorced   Lives with son and grand-daughter   [place] (lives in [place])   [place] lives with patient   42- Daughter [name] (lives in [place])   Works at [employer]   Completed some college   Careers adviser       Outpatient Medications Prior to Visit  Medication Sig Dispense Refill  . aspirin-acetaminophen-caffeine (EXCEDRIN MIGRAINE) 250-250-65 MG per tablet Take 2 tablets by mouth every 6 (six) hours as needed for headache or migraine.    . Cholecalciferol  (VITAMIN D3) 3000 units TABS Take 1 tablet by mouth daily. 30 tablet   . erythromycin ophthalmic ointment Place a 1/2 inch ribbon of ointment into the lower eyelid 4 times daily for 5 days. 1 g 0  . FLUoxetine (PROZAC) 20 MG capsule TAKE ONE CAPSULE BY MOUTH ONCE DAILY 30 capsule 5  . furosemide (LASIX) 20 MG tablet Take 1 tablet (20 mg total) by mouth daily as needed. For swelling 30 tablet 3  . lisinopril-hydrochlorothiazide (PRINZIDE,ZESTORETIC) 20-25 MG tablet TAKE 1/2 (ONE-HALF) TABLET BY MOUTH ONCE DAILY 45 tablet 1  . loratadine (CLARITIN) 10 MG tablet Take 1 tablet (10 mg total) by mouth daily. 30 tablet 11  . meloxicam (MOBIC) 7.5 MG tablet TAKE ONE TABLET BY MOUTH ONCE DAILY 30 tablet 2  . NYSTATIN powder APPLY  POWDER TOPICALLY TO AFFECTED AREA TWICE DAILY AS NEEDED 45 g 1  . potassium chloride SA (K-DUR,KLOR-CON) 20 MEQ tablet Take 1 tablet (20 mEq total) by mouth daily. 30 tablet 3  . venlafaxine XR (EFFEXOR-XR) 37.5 MG 24 hr capsule TAKE ONE CAPSULE BY MOUTH ONCE DAILY FOR 3 DAYS THEN TAKE TWO  CAPSULES BY MOUTH ONCE DAILY 60 capsule 5   No facility-administered medications prior to visit.     Allergies  Allergen Reactions  . Penicillins Itching    Has patient had a PCN reaction causing immediate rash, facial/tongue/throat swelling, SOB or lightheadedness with hypotension: No Has patient had a PCN reaction causing severe rash involving mucus membranes or skin necrosis: No Has patient had a PCN reaction that required hospitalization No Has patient had a PCN reaction occurring within the last 10 years: No If all of the above answers are "NO", then may proceed with Cephalosporin use.   . Venlafaxine Other (See Comments)    "felt spacey"    Review of Systems  Constitutional: Negative for fever and malaise/fatigue.  HENT: Negative for congestion.   Eyes: Positive for pain, discharge and redness. Negative for blurred vision.       Right eye.   Respiratory: Negative for cough  and shortness of breath.   Cardiovascular: Negative for chest pain, palpitations and leg swelling.  Gastrointestinal: Negative for vomiting.  Musculoskeletal: Negative for back pain.  Skin: Negative for rash.  Neurological: Negative for loss of consciousness and headaches.       Objective:    Physical Exam  Eyes: Pupils are equal, round, and reactive to light. EOM are normal. Right eye exhibits chemosis, discharge and exudate. Left eye exhibits chemosis and discharge. Right conjunctiva is injected. Left conjunctiva  is injected.  Nursing note and vitals reviewed.   BP 122/80 (BP Location: Left Arm, Cuff Size: Large)   Pulse 88   Temp 99.2 F (37.3 C) (Oral)   Resp 16   Ht 5\' 4"  (1.626 m)   Wt (!) 329 lb 6.4 oz (149.4 kg)   LMP 10/02/2007 Comment: but had bleeding 18 months ago for short period of time and began again 08/2013  SpO2 96%   BMI 56.54 kg/m  Wt Readings from Last 3 Encounters:  09/23/16 (!) 329 lb 6.4 oz (149.4 kg)  09/20/16 (!) 335 lb (152 kg)  09/10/16 (!) 326 lb 9.6 oz (148.1 kg)   BP Readings from Last 3 Encounters:  09/23/16 122/80  09/20/16 113/83  09/10/16 115/83     Immunization History  Administered Date(s) Administered  . Influenza-Unspecified 12/28/2014  . Tdap 10/10/2014    Health Maintenance  Topic Date Due  . INFLUENZA VACCINE  12/20/2016 (Originally 10/01/2016)  . MAMMOGRAM  10/12/2016  . PAP SMEAR  10/01/2017  . TETANUS/TDAP  10/09/2024  . COLONOSCOPY  08/29/2025  . Hepatitis C Screening  Completed  . HIV Screening  Completed    Lab Results  Component Value Date   WBC 5.6 08/06/2016   HGB 12.6 08/06/2016   HCT 38.1 08/06/2016   PLT 232.0 08/06/2016   GLUCOSE 107 (H) 09/10/2016   CHOL 195 10/10/2014   TRIG 66.0 10/10/2014   HDL 75.40 10/10/2014   LDLCALC 106 (H) 10/10/2014   ALT 18 12/20/2014   AST 22 12/20/2014   NA 137 09/10/2016   K 3.6 09/10/2016   CL 103 09/10/2016   CREATININE 1.00 09/10/2016   BUN 20 09/10/2016   CO2  27 09/10/2016   TSH 2.03 09/05/2014   INR 1.0 12/20/2014    Lab Results  Component Value Date   TSH 2.03 09/05/2014   Lab Results  Component Value Date   WBC 5.6 08/06/2016   HGB 12.6 08/06/2016   HCT 38.1 08/06/2016   MCV 99.1 08/06/2016   PLT 232.0 08/06/2016   Lab Results  Component Value Date   NA 137 09/10/2016   K 3.6 09/10/2016   CO2 27 09/10/2016   GLUCOSE 107 (H) 09/10/2016   BUN 20 09/10/2016   CREATININE 1.00 09/10/2016   BILITOT 0.4 12/20/2014   ALKPHOS 86 12/20/2014   AST 22 12/20/2014   ALT 18 12/20/2014   PROT 7.0 12/20/2014   ALBUMIN 3.6 12/20/2014   CALCIUM 9.4 09/10/2016   ANIONGAP 6 05/17/2015   GFR 74.71 09/10/2016   Lab Results  Component Value Date   CHOL 195 10/10/2014   Lab Results  Component Value Date   HDL 75.40 10/10/2014   Lab Results  Component Value Date   LDLCALC 106 (H) 10/10/2014   Lab Results  Component Value Date   TRIG 66.0 10/10/2014   Lab Results  Component Value Date   CHOLHDL 3 10/10/2014   No results found for: HGBA1C       Assessment & Plan:   Problem List Items Addressed This Visit    None    Visit Diagnoses    Bacterial conjunctivitis of both eyes    -  Primary   Relevant Medications   moxifloxacin (VIGAMOX) 0.5 % ophthalmic solution   Other Relevant Orders   Ambulatory referral to Ophthalmology      I am having Ms. Weekly start on moxifloxacin. I am also having her maintain her aspirin-acetaminophen-caffeine, Vitamin D3, loratadine, potassium chloride SA, furosemide,  nystatin, meloxicam, lisinopril-hydrochlorothiazide, FLUoxetine, venlafaxine XR, and erythromycin.  Meds ordered this encounter  Medications  . moxifloxacin (VIGAMOX) 0.5 % ophthalmic solution    Sig: Place 1 drop into both eyes 3 (three) times daily.    Dispense:  3 mL    Refill:  0    CMA served as scribe during this visit. History, Physical and Plan performed by medical provider. Documentation and orders reviewed and  attested to.  Ann Held, DO

## 2016-09-23 NOTE — Patient Instructions (Signed)

## 2016-09-23 NOTE — Telephone Encounter (Signed)
Handicap placard given to patient in office at office visit.

## 2016-09-23 NOTE — Telephone Encounter (Signed)
I will sign handicap placard but fmla will need to be done by pcp

## 2016-10-12 ENCOUNTER — Emergency Department (HOSPITAL_COMMUNITY): Payer: BLUE CROSS/BLUE SHIELD

## 2016-10-12 ENCOUNTER — Emergency Department (HOSPITAL_COMMUNITY)
Admission: EM | Admit: 2016-10-12 | Discharge: 2016-10-12 | Disposition: A | Discharge status: Home / Self Care | Payer: BLUE CROSS/BLUE SHIELD | Attending: Emergency Medicine | Admitting: Emergency Medicine

## 2016-10-12 ENCOUNTER — Emergency Department (HOSPITAL_BASED_OUTPATIENT_CLINIC_OR_DEPARTMENT_OTHER)
Admit: 2016-10-12 | Discharge: 2016-10-12 | Disposition: A | Discharge status: Home / Self Care | Payer: BLUE CROSS/BLUE SHIELD | Attending: Emergency Medicine | Admitting: Emergency Medicine

## 2016-10-12 ENCOUNTER — Encounter (HOSPITAL_COMMUNITY): Payer: Self-pay | Admitting: Emergency Medicine

## 2016-10-12 DIAGNOSIS — M25512 Pain in left shoulder: Secondary | ICD-10-CM | POA: Insufficient documentation

## 2016-10-12 DIAGNOSIS — I10 Essential (primary) hypertension: Secondary | ICD-10-CM | POA: Diagnosis not present

## 2016-10-12 DIAGNOSIS — Z79899 Other long term (current) drug therapy: Secondary | ICD-10-CM | POA: Insufficient documentation

## 2016-10-12 MED ORDER — IBUPROFEN 600 MG PO TABS: 600.0000 mg | ORAL_TABLET | Freq: Four times a day (QID) | ORAL | 0 refills | Status: DC | PRN

## 2016-10-12 MED ORDER — METHOCARBAMOL 500 MG PO TABS: 500.0000 mg | ORAL_TABLET | Freq: Two times a day (BID) | ORAL | 0 refills | Status: DC

## 2016-10-12 MED ORDER — LIDOCAINE 5 % EX PTCH: 1.0000 | MEDICATED_PATCH | CUTANEOUS | 0 refills | Status: DC

## 2016-10-12 NOTE — Discharge Instructions (Signed)
Take it easy, but do not lay around too much as this may make any stiffness worse.  Antiinflammatory medications: Take 600 mg of ibuprofen every 6 hours or 440 mg (over the counter dose) to 500 mg (prescription dose) of naproxen every 12 hours or for the next 3 days. After this time, these medications may be used as needed for pain. Take these medications with food to avoid upset stomach. Choose only one of these medications, do not take them together.  Tylenol: Should you continue to have additional pain while taking the ibuprofen or naproxen, you may add in tylenol as needed. Your daily total maximum amount of tylenol from all sources should be limited to 4000mg /day for persons without liver problems, or 2000mg /day for those with liver problems. Muscle relaxer: Robaxin is a muscle relaxer and may help loosen stiff muscles. Do not take the Robaxin while driving or performing other dangerous activities.  Lidocaine patches: These are available via either prescription or over-the-counter. The over-the-counter option may be more economical one and are likely just as effective. There are multiple over-the-counter brands, such as Salonpas. Exercises: Be sure to perform the attached exercises starting with three times a week and working up to performing them daily. This is an essential part of preventing long term problems.   Follow up with the orthopedic specialist on this issue. Call the number provided to set up an appointment.

## 2016-10-12 NOTE — Progress Notes (Signed)
VASCULAR LAB PRELIMINARY  PRELIMINARY  PRELIMINARY  PRELIMINARY  Left upper extremity venous duplex completed.    Preliminary report:  There is no DVT or SVT noted in the left upper extremity.  Gave report to Kinsey, RVT 10/12/2016, 1:25 PM

## 2016-10-12 NOTE — ED Provider Notes (Signed)
[facility] DEPT Provider Note   CSN: [id number] Arrival date & time: 10/12/16  7893  By signing my name below, I, Ephriam Jenkins, attest that this documentation has been prepared under the direction and in the presence of Ailsa Mireles PA-C.  Electronically Signed: Ephriam Jenkins, ED Scribe. 10/12/16. 9:09 AM.  History   Chief Complaint Chief Complaint  Patient presents with  . Mass   HPI HPI Comments: Hailey Vance is a 51 y.o. female with Hx of arthritis, who presents to the Emergency Department complaining of gradual onset, constant left shoulder pain that started approximately one week ago. Pain has been steady, but not worsening since onset. Pt states that she started to feel a "knot" in her left shoulder. Pt reports decreased ROM of the left shoulder secondary to pain. Pt describes the pain as a "wringing out sensation", exacerbated with movement of the left shoulder, unrelieved by taking Aleve. Pain is moderate. Pt also describes a feeling of tightness and pressure in the left shoulder. She also reports intermittent tingling in her left arm. Denies fever/chills, falls/trauma, weakness, chest pain, shortness of breath, or any other complaints. Denies history of STDs, HIV, IV drug use, instrumentation to the shoulder, recent surgeries, recent trauma, previous DVT/PE.     The history is provided by the patient. No language interpreter was used.    Past Medical History:  Diagnosis Date  . Anxiety   . Arthritis    osteoarthritis - knees  . Blood transfusion 1990   In [place], [place] with C/S Surgery - 5 units transfused  . Blood transfusion without reported diagnosis 1990   after a c/section  . Chest pain   . Chronic back pain   . Depression   . Fibroid   . Headache(784.0)    otc med prn  . Hypertension   . Low blood potassium   . Plantar fasciitis of left foot   . Sleep apnea    CPAP  . Vitamin D deficiency 09/06/2014    Patient Active Problem List   Diagnosis Date Noted  . BMI  50.0-59.9, adult ([facility])   . Preventative health care 10/10/2014  . HTN (hypertension) 09/06/2014  . Osteoarthritis 09/06/2014  . Post-menopausal bleeding 09/06/2014  . OSA (obstructive sleep apnea) 09/06/2014  . Anxiety and depression 09/06/2014  . Vitamin D deficiency 09/06/2014  . Chronic back pain     Past Surgical History:  Procedure Laterality Date  . BILATERAL OOPHORECTOMY     Complete removal of left ovary and partial removal of right ovary.  . CESAREAN SECTION  1990   x 1  . COLONOSCOPY WITH PROPOFOL N/A 08/30/2015   Procedure: COLONOSCOPY WITH PROPOFOL;  Surgeon: Mauri Pole, MD;  Location: [facility];  Service: Endoscopy;  Laterality: N/A;  . CYSTOSCOPY N/A 05/23/2015   Procedure: CYSTOSCOPY;  Surgeon: Bobbye Charleston, MD;  Location: [facility];  Service: Gynecology;  Laterality: N/A;  . HERNIA REPAIR    . HYSTEROSCOPY W/D&C N/A 01/11/2015   Procedure: DILATATION AND CURETTAGE /HYSTEROSCOPY;  Surgeon: Jerelyn Charles, MD;  Location: [facility];  Service: Gynecology;  Laterality: N/A;  . KNEE SURGERY     right knee  . ROBOTIC ASSISTED TOTAL HYSTERECTOMY WITH SALPINGECTOMY Bilateral 05/23/2015   Procedure: ROBOTIC ASSISTED TOTAL HYSTERECTOMY WITH SALPINGECTOMYwith right oophorectomy;  Surgeon: Bobbye Charleston, MD;  Location: [facility];  Service: Gynecology;  Laterality: Bilateral;  . TUBAL LIGATION  1990    OB History    Gravida Para Term Preterm AB Living   3  3 3     3    SAB TAB Ectopic Multiple Live Births                   Home Medications    Prior to Admission medications   Medication Sig Start Date End Date Taking? Authorizing Provider  aspirin-acetaminophen-caffeine (EXCEDRIN MIGRAINE) [phone] MG per tablet Take 2 tablets by mouth every 6 (six) hours as needed for headache or migraine.    [provider]  Cholecalciferol (VITAMIN D3) 3000 units TABS Take 1 tablet by mouth daily. 08/03/15   Debbrah Alar, NP  erythromycin ophthalmic ointment Place a  1/2 inch ribbon of ointment into the lower eyelid 4 times daily for 5 days. 09/20/16   Nona Dell, PA-C  FLUoxetine (PROZAC) 20 MG capsule TAKE ONE CAPSULE BY MOUTH ONCE DAILY 09/19/16   Debbrah Alar, NP  furosemide (LASIX) 20 MG tablet Take 1 tablet (20 mg total) by mouth daily as needed. For swelling 03/26/16   Debbrah Alar, NP  ibuprofen (ADVIL,MOTRIN) 600 MG tablet Take 1 tablet (600 mg total) by mouth every 6 (six) hours as needed. 10/12/16   Jilberto Vanderwall C, PA-C  lidocaine (LIDODERM) 5 % Place 1 patch onto the skin daily. Remove & Discard patch within 12 hours or as directed by MD 10/12/16   Hulen Mandler, Helane Gunther, PA-C  lisinopril-hydrochlorothiazide (PRINZIDE,ZESTORETIC) 20-25 MG tablet TAKE 1/2 (ONE-HALF) TABLET BY MOUTH ONCE DAILY 09/19/16   Debbrah Alar, NP  loratadine (CLARITIN) 10 MG tablet Take 1 tablet (10 mg total) by mouth daily. 09/11/15   Debbrah Alar, NP  meloxicam (MOBIC) 7.5 MG tablet TAKE ONE TABLET BY MOUTH ONCE DAILY 09/07/16   Debbrah Alar, NP  methocarbamol (ROBAXIN) 500 MG tablet Take 1 tablet (500 mg total) by mouth 2 (two) times daily. 10/12/16   Suhani Stillion C, PA-C  moxifloxacin (VIGAMOX) 0.5 % ophthalmic solution Place 1 drop into both eyes 3 (three) times daily. 09/23/16   Carollee Herter, Yvonne R, DO  NYSTATIN powder APPLY  POWDER TOPICALLY TO AFFECTED AREA TWICE DAILY AS NEEDED 07/23/16   Debbrah Alar, NP  potassium chloride SA (K-DUR,KLOR-CON) 20 MEQ tablet Take 1 tablet (20 mEq total) by mouth daily. 12/21/15   Debbrah Alar, NP  venlafaxine XR (EFFEXOR-XR) 37.5 MG 24 hr capsule TAKE ONE CAPSULE BY MOUTH ONCE DAILY FOR 3 DAYS THEN TAKE TWO  CAPSULES BY MOUTH ONCE DAILY 09/19/16   Debbrah Alar, NP    Family History Family History  Problem Relation Age of Onset  . Lung cancer Mother 36       dec  . Cancer Mother   . Hypertension Mother   . Arthritis Mother   . Cancer Father 26       prostate  . Hypertension Father     . Leukemia Sister   . Hypertension Sister   . Arthritis Sister   . Hyperlipidemia Sister   . Hypertension Brother   . Arthritis Brother   . Multiple sclerosis Sister   . Hypertension Sister   . Hypertension Sister   . Hypertension Brother   . Hypertension Brother   . Breast cancer Paternal Aunt   . Diabetes Maternal Grandmother   . Stroke Maternal Grandmother   . Heart attack Paternal Grandmother     Social History Social History  Substance Use Topics  . Smoking status: Never Smoker  . Smokeless tobacco: Never Used  . Alcohol use No     Allergies   Penicillins and Venlafaxine  Review of Systems Review of Systems  Constitutional: Negative for chills and fever.  Musculoskeletal: Positive for arthralgias and joint swelling. Negative for back pain.     Physical Exam Updated Vital Signs BP (!) 136/108 (BP Location: Right Arm)   Pulse 86   Temp 98.6 F (37 C) (Oral)   Resp 16   Ht 5\' 4"  (1.626 m)   Wt (!) 330 lb (149.7 kg)   LMP 10/02/2007 Comment: but had bleeding 18 months ago for short period of time and began again 08/2013  SpO2 97%   BMI 56.64 kg/m   Physical Exam  Constitutional: She is oriented to person, place, and time. She appears well-developed and well-nourished. No distress.  HENT:  Head: Normocephalic and atraumatic.  Eyes: Conjunctivae are normal.  Neck: Normal range of motion. Neck supple.  Cardiovascular: Normal rate, regular rhythm and intact distal pulses.   Pulmonary/Chest: Effort normal. She exhibits no tenderness.  Musculoskeletal: She exhibits tenderness. She exhibits no edema or deformity.  Firm swelling to the anterior left shoulder extends to include the superior deltoid. Area of swelling is not to be fluctuant. It measures approximately 12 x 12 cm. Mildly tender to the touch. No noted erythema or increased warmth. No pain out of proportion. Active ROM limited to 90 degrees abduction due to pain. Patient allows full passive  manipulation of the joint.  Neurological: She is alert and oriented to person, place, and time.  No known sensory deficits in the upper extremities. Strength 5/5 in the bilateral grips, elbows, and shoulders.  Skin: Skin is warm and dry. Capillary refill takes less than 2 seconds. She is not diaphoretic. No erythema.  Psychiatric: She has a normal mood and affect. Her behavior is normal. Judgment normal.  Nursing note and vitals reviewed.    ED Treatments / Results  DIAGNOSTIC STUDIES: Oxygen Saturation is 97% on RA, normal by my interpretation.  COORDINATION OF CARE: 9:41 AM-Discussed treatment plan with pt at bedside and pt agreed to plan.   Labs (all labs ordered are listed, but only abnormal results are displayed) Labs Reviewed - No data to display  EKG  EKG Interpretation None       Radiology Ct Shoulder Left Wo Contrast  Result Date: 10/12/2016 CLINICAL DATA:  Left shoulder pain for 10 days. Palpable knot in the left axilla. EXAM: CT OF THE UPPER LEFT EXTREMITY WITHOUT CONTRAST TECHNIQUE: Multidetector CT imaging of the upper left extremity was performed according to the standard protocol. COMPARISON:  Radiographs 10/12/2016 FINDINGS: There is a large fatty lesion just deep to the deltoid musculature and surrounding the humerus. It measures 11.0 x 11.0 x 5.0 cm. There are few septations but this is homogeneous fat and consistent with a benign lipoma. The rotator cuff tendons appear intact. The rotator cuff muscles appear normal. The bony structures are intact. No fracture or osteochondral lesion. [facility] joint and glenohumeral joints are maintained. Minimal degenerative changes. No findings for bony impingement. There is an accessory muscle noted near the left axilla. This is deep to the clavicle and is likely a subclavius muscle. The visualized left lung is grossly clear. No worrisome lung lesions. The heart appears enlarged. IMPRESSION: 1. 11 x 11 x 5 cm fatty mass deep to the  deltoid musculature. This has CT imaging features of a benign lipoma. 2. No significant bony findings involving the left shoulder or chest wall musculature. Electronically Signed   By: Marijo Sanes M.D.   On: 10/12/2016 15:23   Dg  Shoulder Left  Result Date: 10/12/2016 CLINICAL DATA:  Left shoulder pain for months EXAM: LEFT SHOULDER - 2+ VIEW COMPARISON:  None. FINDINGS: No acute fracture. No dislocation.  Unremarkable soft tissues. IMPRESSION: No acute bony pathology. Electronically Signed   By: Marybelle Killings M.D.   On: 10/12/2016 10:58    Procedures Aspiration of blood/fluid Date/Time: 10/12/2016 4:05 PM Performed by: Lorayne Bender Authorized by: Arlean Hopping C  Consent: Verbal consent obtained. Risks and benefits: risks, benefits and alternatives were discussed Consent given by: patient Patient understanding: patient states understanding of the procedure being performed Patient consent: the patient's understanding of the procedure matches consent given Procedure consent: procedure consent matches procedure scheduled Patient identity confirmed: verbally with patient Time out: Immediately prior to procedure a "time out" was called to verify the correct patient, procedure, equipment, support staff and site/side marked as required. Preparation: Patient was prepped and draped in the usual sterile fashion. Local anesthesia used: yes Anesthesia: local infiltration  Anesthesia: Local anesthesia used: yes Local Anesthetic: lidocaine 2% with epinephrine  Sedation: Patient sedated: no Patient tolerance: Patient tolerated the procedure well with no immediate complications Comments: Aspiration of subcutaneous mass identified as possible lipoma on CT. Aspiration revealed no fluid or blood.    (including critical care time)  EMERGENCY DEPARTMENT US SOFT TISSUE INTERPRETATION "Study: Limited Soft Tissue Ultrasound"  INDICATIONS: Pain Multiple views of the body part were obtained in real-time  with a multi-frequency linear probe  PERFORMED BY: Myself IMAGES ARCHIVED?: Yes SIDE:Left BODY PART:Upper extremity INTERPRETATION:  No abcess noted and No cellulitis noted  Medications Ordered in ED Medications - No data to display   Initial Impression / Assessment and Plan / ED Course  I have reviewed the triage vital signs and the nursing notes.  Pertinent labs & imaging results that were available during my care of the patient were reviewed by me and considered in my medical decision making (see chart for details).  Clinical Course as of Oct 12 1729  Nancy Fetter Oct 12, 2016  1340 Discussed xray and [place] results with patient. Patient states her pain is still manageable. CT discussed with patient. Patient agrees to this exam. Patient was again offered pain management, but declined.  [SJ]    Clinical Course User Index [SJ] Maleya Leever C, PA-C     Patient presents with an area of firm swelling to the left anterior shoulder. I highly doubt septic joint in this patient based on her reported symptoms and physical exam findings. Identified as possible lipoma on CT. Shared decision-making was used regarding aspiration for further analysis versus simply following up with orthopedic surgeon. Patient opted for aspiration prior to discharge. No fluid able to be aspirated from mass. Orthopedic follow-up. The patient was given instructions for home care as well as return precautions. Patient voices understanding of these instructions, accepts the plan, and is comfortable with discharge.  Findings and plan of care discussed with Isla Pence, MD. Dr. Gilford Raid personally evaluated and examined this patient.    Final Clinical Impressions(s) / ED Diagnoses   Final diagnoses:  Acute pain of left shoulder    New Prescriptions New Prescriptions   IBUPROFEN (ADVIL,MOTRIN) 600 MG TABLET    Take 1 tablet (600 mg total) by mouth every 6 (six) hours as needed.   LIDOCAINE (LIDODERM) 5 %    Place 1 patch  onto the skin daily. Remove & Discard patch within 12 hours or as directed by MD   METHOCARBAMOL (ROBAXIN) 500 MG TABLET  Take 1 tablet (500 mg total) by mouth 2 (two) times daily.    I personally performed the services described in this documentation, which was scribed in my presence. The recorded information has been reviewed and is accurate.    Lorayne Bender, PA-C 10/12/16 Belen, Julie, MD 10/12/16 1745

## 2016-10-12 NOTE — ED Triage Notes (Signed)
Pt has a bump under her left armpit that is hard and painful to palpation. No redness or drainage noted.

## 2016-10-16 ENCOUNTER — Ambulatory Visit: Payer: BLUE CROSS/BLUE SHIELD | Admitting: Family Medicine

## 2016-10-16 DIAGNOSIS — Z0289 Encounter for other administrative examinations: Secondary | ICD-10-CM

## 2016-10-20 ENCOUNTER — Encounter: Payer: Self-pay | Admitting: Family Medicine

## 2016-10-20 ENCOUNTER — Ambulatory Visit: Payer: BLUE CROSS/BLUE SHIELD | Admitting: Family Medicine

## 2016-10-24 ENCOUNTER — Encounter (INDEPENDENT_AMBULATORY_CARE_PROVIDER_SITE_OTHER): Payer: Self-pay | Admitting: Orthopaedic Surgery

## 2016-10-24 ENCOUNTER — Ambulatory Visit (INDEPENDENT_AMBULATORY_CARE_PROVIDER_SITE_OTHER): Payer: Self-pay

## 2016-10-24 ENCOUNTER — Ambulatory Visit (INDEPENDENT_AMBULATORY_CARE_PROVIDER_SITE_OTHER): Payer: BLUE CROSS/BLUE SHIELD | Admitting: Orthopaedic Surgery

## 2016-10-24 VITALS — BP 148/89 | HR 69 | Resp 18 | Ht 64.0 in | Wt 326.0 lb

## 2016-10-24 DIAGNOSIS — G8929 Other chronic pain: Secondary | ICD-10-CM

## 2016-10-24 DIAGNOSIS — M25562 Pain in left knee: Secondary | ICD-10-CM

## 2016-10-24 DIAGNOSIS — M25512 Pain in left shoulder: Secondary | ICD-10-CM

## 2016-10-24 DIAGNOSIS — R2230 Localized swelling, mass and lump, unspecified upper limb: Secondary | ICD-10-CM | POA: Diagnosis not present

## 2016-10-24 DIAGNOSIS — M25561 Pain in right knee: Secondary | ICD-10-CM | POA: Diagnosis not present

## 2016-10-24 NOTE — Progress Notes (Signed)
Office Visit Note   Patient: Hailey Vance           Date of Birth: April 29, 1965           MRN: [id number] Visit Date: 10/24/2016              Requested by: Debbrah Alar, NP [address] [place], [place] [address] PCP: Debbrah Alar, NP   Assessment & Plan: Visit Diagnoses:  1. Left shoulder pain, unspecified chronicity   2. Chronic pain of right knee   3. Chronic pain of left knee   4. Mass of shoulder region   End-stage osteoarthritis right and left knee. Probable benign large lipoma left shoulder by CT scan  Plan: MRI scan left shoulder to supplement CT scan and further delineate mass. Bariatric clinic evaluation for weight loss related to BMI of 56. Spent at least 45 minutes discussing bilateral knee pain need for weight loss and need for MRI scan left shoulder to further delineate mass. Hailey Vance has only been experiencing symptoms in the last several weeks.  Follow-Up Instructions: Return after MRI left shoulder.   Orders:  Orders Placed This Encounter  Procedures  . XR KNEE 3 VIEW RIGHT  . XR KNEE 3 VIEW LEFT  . MR SHOULDER LEFT WO CONTRAST   No orders of the defined types were placed in this encounter.     Procedures: No procedures performed   Clinical Data: No additional findings.   Subjective: Chief Complaint  Patient presents with  . Left Upper Arm - Mass    SHe went to ED 2 weeks ago for  . Left Knee - Pain, Edema    Hailey Vance is a 51 y o that presents with chronic Bilateral knee pain x years. BMI 55.93  . Right Knee - Pain, Edema  Hailey Vance relates a long history of bilateral knee pain. She has seen Dr. Gladstone Lighter years ago with an right knee arthroscopy and subsequent Visco supplementation. She notes that Over time she's had more and more problems with her knees associated with aching, swelling and stiffness. His becoming a "harder and harder for her to bear weight. She recently was evaluated in the emergency room with left  shoulder pain associated with inability left arm above her head. A CT scan was performed demonstrating what appeared to be a large lipoma. Aspiration was negative. She denies numbness or tingling. No history of fever or chills shortness of breath or chest pain HPI  Review of Systems  Constitutional: Negative for chills, fatigue and fever.  Eyes: Negative for itching.  Respiratory: Positive for shortness of breath. Negative for chest tightness.   Cardiovascular: Positive for leg swelling. Negative for chest pain and palpitations.  Gastrointestinal: Negative for blood in stool, constipation and diarrhea.  Musculoskeletal: Positive for back pain, joint swelling and neck pain. Negative for neck stiffness.  Neurological: Positive for weakness and headaches. Negative for dizziness and numbness.  Hematological: Does not bruise/bleed easily.  Psychiatric/Behavioral: Negative for sleep disturbance. The patient is not nervous/anxious.      Objective: Vital Signs: BP (!) 148/89   Pulse 69   Resp 18   Ht 5\' 4"  (1.626 m)   Wt (!) 326 lb (147.9 kg)   LMP 10/02/2007 Comment: but had bleeding 18 months ago for short period of time and began again 08/2013  BMI 55.96 kg/m   Physical Exam  Ortho Exam large mass anterior aspect left shoulder limited range of motion. Abduction about 80  flexion about 90. The mass was not read there was didn't appear to be fluctuant. I did aspirate this with a with a boot with nothing retrieved. I also reviewed the CT scan that demonstrates  about 11 x 11cm solid mass consistent with a lipoma in the subdeltoid region. Good grip and good release. No pain range of motion of cervical spine.. Bilateral knee pain predominantly medially associated with patellar crepitation. Very large knees. BMI is 56. pain. Feet were warm but I couldn't feel pulses. Sensation intact. Full extension and about 90 of flexion at which point calf touches thigh. No obvious instability. Difficult to  determine if there is an effusion. Straight leg raise negative bilaterally. Painless range of motion both hips.  Specialty Comments:  No specialty comments available.  Imaging: Xr Knee 3 View Left  Result Date: 10/24/2016 Films of left knee are obtained in 3 projections standing. There are end-stage osteoarthritic changes in all 3 compartments. There is near bone-on-bone in the medial compartment associated with subchondral sclerosis and peripheral osteophytes both medially and laterally. No evidence of fracture. No ectopic calcification significant decrease in the joint about the patellofemoral joint with peripheral osteophytes. Consistent with end-stage osteoarthritis  Xr Knee 3 View Right  Result Date: 10/24/2016 Films of the right knee were obtained in 3 projections standing there is evidence of tricompartmental osteoarthritis. Predominant findings of the medial compartment where there is near bone-on-bone with peripheral osteophytes and subchondral sclerosis. Approximately 1 of varus. No ectopic calcification or evidence of fracture. Arch osteophytes along the medial lateral patellofemoral joint. All consistent with near end-stage osteoarthritis    PMFS History: Patient Active Problem List   Diagnosis Date Noted  . BMI 50.0-59.9, adult ([facility])   . Preventative health care 10/10/2014  . HTN (hypertension) 09/06/2014  . Osteoarthritis 09/06/2014  . Post-menopausal bleeding 09/06/2014  . OSA (obstructive sleep apnea) 09/06/2014  . Anxiety and depression 09/06/2014  . Vitamin D deficiency 09/06/2014  . Chronic back pain    Past Medical History:  Diagnosis Date  . Anxiety   . Arthritis    osteoarthritis - knees  . Blood transfusion 1990   In [place], [place] with C/S Surgery - 5 units transfused  . Blood transfusion without reported diagnosis 1990   after a c/section  . Chest pain   . Chronic back pain   . Depression   . Fibroid   . Headache(784.0)    otc med prn  .  Hypertension   . Low blood potassium   . Plantar fasciitis of left foot   . Sleep apnea    CPAP  . Vitamin D deficiency 09/06/2014    Family History  Problem Relation Age of Onset  . Lung cancer Mother 60       dec  . Cancer Mother   . Hypertension Mother   . Arthritis Mother   . Cancer Father 90       prostate  . Hypertension Father   . Leukemia Sister   . Hypertension Sister   . Arthritis Sister   . Hyperlipidemia Sister   . Hypertension Brother   . Arthritis Brother   . Multiple sclerosis Sister   . Hypertension Sister   . Hypertension Sister   . Hypertension Brother   . Hypertension Brother   . Breast cancer Paternal Aunt   . Diabetes Maternal Grandmother   . Stroke Maternal Grandmother   . Heart attack Paternal Grandmother     Past Surgical History:  Procedure Laterality Date  .  BILATERAL OOPHORECTOMY     Complete removal of left ovary and partial removal of right ovary.  . CESAREAN SECTION  1990   x 1  . COLONOSCOPY WITH PROPOFOL N/A 08/30/2015   Procedure: COLONOSCOPY WITH PROPOFOL;  Surgeon: Mauri Pole, MD;  Location: [facility];  Service: Endoscopy;  Laterality: N/A;  . CYSTOSCOPY N/A 05/23/2015   Procedure: CYSTOSCOPY;  Surgeon: Bobbye Charleston, MD;  Location: [facility];  Service: Gynecology;  Laterality: N/A;  . HERNIA REPAIR    . HYSTEROSCOPY W/D&C N/A 01/11/2015   Procedure: DILATATION AND CURETTAGE /HYSTEROSCOPY;  Surgeon: Jerelyn Charles, MD;  Location: [facility];  Service: Gynecology;  Laterality: N/A;  . KNEE SURGERY     right knee  . ROBOTIC ASSISTED TOTAL HYSTERECTOMY WITH SALPINGECTOMY Bilateral 05/23/2015   Procedure: ROBOTIC ASSISTED TOTAL HYSTERECTOMY WITH SALPINGECTOMYwith right oophorectomy;  Surgeon: Bobbye Charleston, MD;  Location: [facility];  Service: Gynecology;  Laterality: Bilateral;  . TUBAL LIGATION  1990   Social History   Occupational History  . Not on file.   Social History Main Topics  . Smoking status: Never Smoker  .  Smokeless tobacco: Never Used  . Alcohol use No  . Drug use: No  . Sexual activity: Not Currently    Partners: Male    Birth control/ protection: Surgical     Comment: Tubal

## 2016-11-01 ENCOUNTER — Ambulatory Visit
Admission: RE | Admit: 2016-11-01 | Discharge: 2016-11-01 | Disposition: A | Discharge status: Home / Self Care | Payer: BLUE CROSS/BLUE SHIELD | Source: Ambulatory Visit | Attending: Orthopaedic Surgery | Admitting: Orthopaedic Surgery

## 2016-11-01 DIAGNOSIS — M25512 Pain in left shoulder: Secondary | ICD-10-CM

## 2016-11-04 ENCOUNTER — Telehealth: Payer: Self-pay | Admitting: Rheumatology

## 2016-11-04 NOTE — Telephone Encounter (Signed)
Patient left a message stating her MRI is scheduled for Saturday (9/8), and return office visit is October 1. Patient requesting something for pain. Per patient she does not want to wait a month before something is done, and be in pain. Please call patient to advise.

## 2016-11-04 NOTE — Progress Notes (Signed)
Called and recommended surgical excisin

## 2016-11-04 NOTE — Telephone Encounter (Signed)
Please advise 

## 2016-11-04 NOTE — Telephone Encounter (Signed)
Hydrocodone with tylenol 5/325 #30 1-2 tabs po q8h prn

## 2016-11-05 ENCOUNTER — Other Ambulatory Visit (INDEPENDENT_AMBULATORY_CARE_PROVIDER_SITE_OTHER): Payer: Self-pay | Admitting: Orthopedic Surgery

## 2016-11-05 MED ORDER — HYDROCODONE-ACETAMINOPHEN 5-325 MG PO TABS: 1.0000 | ORAL_TABLET | Freq: Three times a day (TID) | ORAL | 0 refills | Status: DC | PRN

## 2016-11-05 NOTE — Telephone Encounter (Signed)
Please see message. °

## 2016-11-05 NOTE — Telephone Encounter (Signed)
done

## 2016-11-06 ENCOUNTER — Ambulatory Visit: Payer: BLUE CROSS/BLUE SHIELD | Admitting: Family Medicine

## 2016-11-06 ENCOUNTER — Telehealth (INDEPENDENT_AMBULATORY_CARE_PROVIDER_SITE_OTHER): Payer: Self-pay | Admitting: Orthopaedic Surgery

## 2016-11-06 DIAGNOSIS — Z0289 Encounter for other administrative examinations: Secondary | ICD-10-CM

## 2016-11-06 NOTE — Telephone Encounter (Signed)
LVM with pt to please call to schedule surgery. Will try pt again at a later time. 

## 2016-11-07 ENCOUNTER — Telehealth (INDEPENDENT_AMBULATORY_CARE_PROVIDER_SITE_OTHER): Payer: Self-pay | Admitting: Orthopaedic Surgery

## 2016-11-07 NOTE — Telephone Encounter (Signed)
Patient having trouble getting rx for Hydrocodone filled. [facility] will only fill 15 pills stating she will have to forfeit the other 43, and [facility] will only fill 21 stating insurance will only cover this amount. Patient doesn't know what to do. Please advise.

## 2016-11-07 NOTE — Telephone Encounter (Signed)
Please advise 

## 2016-11-07 NOTE — Telephone Encounter (Signed)
Then go to [address].  Sorry it is the new law.

## 2016-11-10 ENCOUNTER — Telehealth (INDEPENDENT_AMBULATORY_CARE_PROVIDER_SITE_OTHER): Payer: Self-pay | Admitting: Orthopaedic Surgery

## 2016-11-10 NOTE — Telephone Encounter (Signed)
LVM for pt to call to schedule surgery. Will try pt again at a later time.

## 2016-11-11 ENCOUNTER — Encounter (INDEPENDENT_AMBULATORY_CARE_PROVIDER_SITE_OTHER): Payer: Self-pay | Admitting: Orthopaedic Surgery

## 2016-11-18 NOTE — H&P (Signed)
Hailey Fears, MD   Hailey Borg, PA-C [address], [place], [place]  [address]                             [phone]   ORTHOPAEDIC HISTORY & PHYSICAL  Hailey Vance MRN:  [id number] DOB/SEX:  02-Jun-1965/female  CHIEF COMPLAINT:  Painful left shoulder  HISTORY: Hailey Vance is a pleasant 51 year old female who recently was evaluated in the emergency room with left shoulder pain associated with inability to place her left arm above her head. A CT scan was performed demonstrating what appeared to be a large lipoma. Aspiration was negative. She denies numbness or tingling.  An MRI was performed and was noted   IMPRESSION: 1. There is a large lipoma within or along the deep surface of the deltoid muscle. Low-level edema in the muscle immediately adjacent to the lipoma likely from local mass effect. 2. Mild supraspinatus and subscapularis tendinopathy. 3. Mild degenerative AC joint arthropathy and mild degenerative glenohumeral arthropathy. Small shoulder joint effusion. 4. Trace subacromial subdeltoid bursitis.  She is tired of this mass and would like to proceed with excision of a lipoma.  PAST MEDICAL HISTORY: Patient Active Problem List   Diagnosis Date Noted  . BMI 50.0-59.9, adult ([place])   . Preventative health care 10/10/2014  . HTN (hypertension) 09/06/2014  . Osteoarthritis 09/06/2014  . Post-menopausal bleeding 09/06/2014  . OSA (obstructive sleep apnea) 09/06/2014  . Anxiety and depression 09/06/2014  . Vitamin D deficiency 09/06/2014  . Chronic back pain    Past Medical History:  Diagnosis Date  . Anxiety   . Arthritis    osteoarthritis - knees  . Blood transfusion 1990   In [place], [place] with C/S Surgery - 5 units transfused  . Blood transfusion without reported diagnosis 1990   after a c/section  . Chest pain   . Chronic back pain   . Depression   . Fibroid   . Headache(784.0)    otc med prn  . Hypertension   . Low blood potassium   . Plantar  fasciitis of left foot   . Sleep apnea    CPAP  . Vitamin D deficiency 09/06/2014   Past Surgical History:  Procedure Laterality Date  . BILATERAL OOPHORECTOMY     Complete removal of left ovary and partial removal of right ovary.  . CESAREAN SECTION  1990   x 1  . COLONOSCOPY WITH PROPOFOL N/A 08/30/2015   Procedure: COLONOSCOPY WITH PROPOFOL;  Surgeon: Mauri Pole, MD;  Location: [facility];  Service: Endoscopy;  Laterality: N/A;  . CYSTOSCOPY N/A 05/23/2015   Procedure: CYSTOSCOPY;  Surgeon: Bobbye Charleston, MD;  Location: [facility];  Service: Gynecology;  Laterality: N/A;  . HERNIA REPAIR    . HYSTEROSCOPY W/D&C N/A 01/11/2015   Procedure: DILATATION AND CURETTAGE /HYSTEROSCOPY;  Surgeon: Jerelyn Charles, MD;  Location: [facility];  Service: Gynecology;  Laterality: N/A;  . KNEE SURGERY     right knee  . ROBOTIC ASSISTED TOTAL HYSTERECTOMY WITH SALPINGECTOMY Bilateral 05/23/2015   Procedure: ROBOTIC ASSISTED TOTAL HYSTERECTOMY WITH SALPINGECTOMYwith right oophorectomy;  Surgeon: Bobbye Charleston, MD;  Location: [facility];  Service: Gynecology;  Laterality: Bilateral;  . TUBAL LIGATION  1990     MEDICATIONS:   No prescriptions prior to admission.   No current facility-administered medications for this encounter.    Current Outpatient Prescriptions  Medication Sig Dispense Refill  . aspirin-acetaminophen-caffeine ([place]) 250-250-65 MG  per tablet Take 2 tablets by mouth every 6 (six) hours as needed for headache or migraine.    . Cholecalciferol (VITAMIN D3) 3000 units TABS Take 1 tablet by mouth daily. 30 tablet   . erythromycin ophthalmic ointment Place a 1/2 inch ribbon of ointment into the lower eyelid 4 times daily for 5 days. 1 g 0  . FLUoxetine (PROZAC) 20 MG capsule TAKE ONE CAPSULE BY MOUTH ONCE DAILY 30 capsule 5  . furosemide (LASIX) 20 MG tablet Take 1 tablet (20 mg total) by mouth daily as needed. For swelling 30 tablet 3  . HYDROcodone-acetaminophen (NORCO)  5-325 MG tablet Take 1 tablet by mouth every 8 (eight) hours as needed for moderate pain. 30 tablet 0  . ibuprofen (ADVIL,MOTRIN) 600 MG tablet Take 1 tablet (600 mg total) by mouth every 6 (six) hours as needed. 30 tablet 0  . lidocaine (LIDODERM) 5 % Place 1 patch onto the skin daily. Remove & Discard patch within 12 hours or as directed by MD 30 patch 0  . lisinopril-hydrochlorothiazide (PRINZIDE,ZESTORETIC) 20-25 MG tablet TAKE 1/2 (ONE-HALF) TABLET BY MOUTH ONCE DAILY 45 tablet 1  . loratadine (CLARITIN) 10 MG tablet Take 1 tablet (10 mg total) by mouth daily. 30 tablet 11  . meloxicam (MOBIC) 7.5 MG tablet TAKE ONE TABLET BY MOUTH ONCE DAILY 30 tablet 2  . methocarbamol (ROBAXIN) 500 MG tablet Take 1 tablet (500 mg total) by mouth 2 (two) times daily. 20 tablet 0  . moxifloxacin (VIGAMOX) 0.5 % ophthalmic solution Place 1 drop into both eyes 3 (three) times daily. 3 mL 0  . NYSTATIN powder APPLY  POWDER TOPICALLY TO AFFECTED AREA TWICE DAILY AS NEEDED 45 g 1  . potassium chloride SA (K-DUR,KLOR-CON) 20 MEQ tablet Take 1 tablet (20 mEq total) by mouth daily. 30 tablet 3  . venlafaxine XR (EFFEXOR-XR) 37.5 MG 24 hr capsule TAKE ONE CAPSULE BY MOUTH ONCE DAILY FOR 3 DAYS THEN TAKE TWO  CAPSULES BY MOUTH ONCE DAILY 60 capsule 5     ALLERGIES:   Allergies  Allergen Reactions  . Penicillins Itching    Has patient had a PCN reaction causing immediate rash, facial/tongue/throat swelling, SOB or lightheadedness with hypotension: No Has patient had a PCN reaction causing severe rash involving mucus membranes or skin necrosis: No Has patient had a PCN reaction that required hospitalization No Has patient had a PCN reaction occurring within the last 10 years: No If all of the above answers are "NO", then may proceed with Cephalosporin use.   . Venlafaxine Other (See Comments)    "felt spacey"    REVIEW OF SYSTEMS:  A comprehensive review of systems was negative except for: Musculoskeletal:  positive for Shoulder pain   FAMILY HISTORY:   Family History  Problem Relation Age of Onset  . Lung cancer Mother 67       dec  . Cancer Mother   . Hypertension Mother   . Arthritis Mother   . Cancer Father 57       prostate  . Hypertension Father   . Leukemia Sister   . Hypertension Sister   . Arthritis Sister   . Hyperlipidemia Sister   . Hypertension Brother   . Arthritis Brother   . Multiple sclerosis Sister   . Hypertension Sister   . Hypertension Sister   . Hypertension Brother   . Hypertension Brother   . Breast cancer Paternal Aunt   . Diabetes Maternal Grandmother   . Stroke Maternal  Grandmother   . Heart attack Paternal Grandmother     SOCIAL HISTORY:   Social History  Substance Use Topics  . Smoking status: Never Smoker  . Smokeless tobacco: Never Used  . Alcohol use No      EXAMINATION: Vital signs in last 24 hours: BP: ()/()  Arterial Line BP: ()/()   Head is normocephalic.   Eyes:  Pupils equal, round and reactive to light and accommodation.  Extraocular intact. ENT: Ears, nose, and throat were benign.   Neck: supple, no bruits were noted.   Chest: good expansion.   Lungs: essentially clear.   Cardiac: regular rhythm and rate, normal S1, S2.  No murmurs appreciated. Pulses :  2+ bilateral and symmetric in upper extremities. Abdomen is scaphoid, soft, nontender, no masses palpable, normal bowel sounds  present. CNS:  He is oriented x3 and cranial nerves II-XII grossly intact. Breast, rectal, and genital exams: not performed and not indicated for an orthopedic evaluation. Musculoskeletal: Ortho Exam large mass anterior aspect left shoulder limited range of motion. Abduction about 80 flexion about 90. The mass was not read there was didn't appear to be fluctuant.   Imaging Review Mr Shoulder Left Wo Contrast  Result Date: 11/01/2016 CLINICAL DATA:  Left shoulder pain and reduced range of motion. Weakness and numbness. Mass noticed 1 month  ago. EXAM: MRI OF THE LEFT SHOULDER WITHOUT CONTRAST TECHNIQUE: Multiplanar, multisequence MR imaging of the shoulder was performed. No intravenous contrast was administered. COMPARISON:  CT shoulder from 10/12/2016 FINDINGS: Rotator cuff:  Mild supraspinatus and subscapularis tendinopathy. Muscles: There is a 11.4 by 11.1 by 5.9 cm (volume = 390 cm^3) lipoma within or along the deep surface of the left deltoid muscle laterally. Low-level edema signal within the deltoid muscle along the margin of this lipoma may well be due to stretching and mass effect. No nodular elements of the fatty mass to be particularly worrisome for sarcoma. The mass abuts the lateral margin of the proximal humerus. The mass abuts the posterior circumflex artery and is near the superolateral brachial cutaneous nerve. Biceps long head: Unremarkable. The large lipoma abuts the biceps tendon sheath. Acromioclavicular Joint: Mild spurring and mild subcortical marrow edema along the [facility] joint. Type II acromion. Trace fluid in the subacromial subdeltoid bursa. Glenohumeral Joint: Small shoulder joint effusion. Mild degenerative chondral thinning especially inferiorly along the glenoid. Labrum:  Grossly unremarkable Bones: No significant extra-articular osseous abnormalities identified. Other: No supplemental non-categorized findings. IMPRESSION: 1. There is a large lipoma within or along the deep surface of the deltoid muscle. Low-level edema in the muscle immediately adjacent to the lipoma likely from local mass effect. 2. Mild supraspinatus and subscapularis tendinopathy. 3. Mild degenerative AC joint arthropathy and mild degenerative glenohumeral arthropathy. Small shoulder joint effusion. 4. Trace subacromial subdeltoid bursitis. Electronically Signed   By: Van Clines M.D.   On: 11/01/2016 16:49    ASSESSMENT: left shoulder massive lipoma  Past Medical History:  Diagnosis Date  . Anxiety   . Arthritis    osteoarthritis - knees   . Blood transfusion 1990   In [place], [place] with C/S Surgery - 5 units transfused  . Blood transfusion without reported diagnosis 1990   after a c/section  . Chest pain   . Chronic back pain   . Depression   . Fibroid   . Headache(784.0)    otc med prn  . Hypertension   . Low blood potassium   . Plantar fasciitis of left foot   .  Sleep apnea    CPAP  . Vitamin D deficiency 09/06/2014    PLAN: Plan for left shoulder excision of massive lipoma   The procedure,  risks, and benefits of surgery were presented and reviewed. The risks including but not limited to infection, blood clots, vascular and nerve injury, stiffness,  among others were discussed. The patient acknowledged the explanation, agreed to proceed.   Mike Craze [place], [place] [phone]   11/18/2016 5:39 PM

## 2016-11-26 ENCOUNTER — Telehealth (INDEPENDENT_AMBULATORY_CARE_PROVIDER_SITE_OTHER): Payer: Self-pay | Admitting: Orthopaedic Surgery

## 2016-11-26 NOTE — Telephone Encounter (Signed)
Patient request a call back concerning her shoulder. Surgery is scheduled, but patient is now having trouble raising her arm/ any movement at all. Pain in arm going up into neck. Patient would like to know if this is normal. Please call to advise.

## 2016-11-26 NOTE — Telephone Encounter (Signed)
Please call.

## 2016-11-28 NOTE — Pre-Procedure Instructions (Signed)
Hailey Vance  11/28/2016      [facility], [address] [place]. [place] [address] Phone: [phone] Fax: [phone]    Your procedure is scheduled on Tuesday 12/02/2016.  Report to [facility] at 0800 A.M.  Call this number if you have problems the morning of surgery:  [phone]   Remember:  Do not eat food or drink liquids after midnight.   Continue all other medications as directed by your physician except follow these medication instructions before surgery   Take these medicines the morning of surgery with A SIP OF WATER: Fluoxetine (Prozac) Hydrocodone-acetaminophen (Norco) - if needed Loratidine (Claritin) Methocarbamol (Robaxin)   7 days prior to surgery STOP taking any Aspirin, Aleve, Naproxen, Ibuprofen, Motrin, Advil, Goody's, BC's, all herbal medications, fish oil, and all vitamins, including your Excedrin Migraine and Meloxicam (Mobic)    Do not wear jewelry, make-up or nail polish.  Do not wear lotions, powders, or perfumes, or deodorant.  Do not shave 48 hours prior to surgery.  Do not bring valuables to the hospital.  [facility] is not responsible for any belongings or valuables.  Contacts, eyeglasses, dentures or bridgework may not be worn into surgery.  Leave your suitcase in the car.  After surgery it may be brought to your room.  For patients admitted to the hospital, discharge time will be determined by your treatment team.  Patients discharged the day of surgery will not be allowed to drive home.   Name and phone number of your driver:    Special instructions:   [facility]- Preparing For Surgery  Before surgery, you can play an important role. Because skin is not sterile, your skin needs to be as free of germs as possible. You can reduce the number of germs on your skin by washing with CHG (chlorahexidine gluconate) Soap before surgery.  CHG is an antiseptic  cleaner which kills germs and bonds with the skin to continue killing germs even after washing.  Please do not use if you have an allergy to CHG or antibacterial soaps. If your skin becomes reddened/irritated stop using the CHG.  Do not shave (including legs and underarms) for at least 48 hours prior to first CHG shower. It is OK to shave your face.  Please follow these instructions carefully.   1. Shower the NIGHT BEFORE SURGERY and the MORNING OF SURGERY with CHG.   2. If you chose to wash your hair, wash your hair first as usual with your normal shampoo.  3. After you shampoo, rinse your hair and body thoroughly to remove the shampoo.  4. Use CHG as you would any other liquid soap. You can apply CHG directly to the skin and wash gently with a scrungie or a clean washcloth.   5. Apply the CHG Soap to your body ONLY FROM THE NECK DOWN.  Do not use on open wounds or open sores. Avoid contact with your eyes, ears, mouth and genitals (private parts). Wash genitals (private parts) with your normal soap.  6. Wash thoroughly, paying special attention to the area where your surgery will be performed.  7. Thoroughly rinse your body with warm water from the neck down.  8. DO NOT shower/wash with your normal soap after using and rinsing off the CHG Soap.  9. Pat yourself dry with a CLEAN TOWEL.   10. Wear CLEAN PAJAMAS   11. Place CLEAN SHEETS on your bed  the night of your first shower and DO NOT SLEEP WITH PETS.    Day of Surgery: Do not apply any deodorants/lotions. Please wear clean clothes to the hospital/surgery center.      Please read over the following fact sheets that you were given. Pain Booklet, Coughing and Deep Breathing and Surgical Site Infection Prevention, and Incentive Spiromtery

## 2016-11-29 NOTE — Telephone Encounter (Signed)
Tried to call twice and left messager-please try as well and relate that pain is most likely normal given the size of the tumor

## 2016-12-01 ENCOUNTER — Telehealth (INDEPENDENT_AMBULATORY_CARE_PROVIDER_SITE_OTHER): Payer: Self-pay | Admitting: Radiology

## 2016-12-01 ENCOUNTER — Ambulatory Visit (INDEPENDENT_AMBULATORY_CARE_PROVIDER_SITE_OTHER): Payer: BLUE CROSS/BLUE SHIELD | Admitting: Orthopaedic Surgery

## 2016-12-01 ENCOUNTER — Other Ambulatory Visit: Payer: Self-pay

## 2016-12-01 ENCOUNTER — Encounter (HOSPITAL_COMMUNITY)
Admission: RE | Admit: 2016-12-01 | Discharge: 2016-12-01 | Disposition: A | Discharge status: Home / Self Care | Payer: BLUE CROSS/BLUE SHIELD | Source: Ambulatory Visit | Attending: Orthopaedic Surgery | Admitting: Orthopaedic Surgery

## 2016-12-01 ENCOUNTER — Encounter (HOSPITAL_COMMUNITY): Payer: Self-pay

## 2016-12-01 DIAGNOSIS — N950 Postmenopausal bleeding: Secondary | ICD-10-CM | POA: Diagnosis not present

## 2016-12-01 DIAGNOSIS — Z79899 Other long term (current) drug therapy: Secondary | ICD-10-CM | POA: Diagnosis not present

## 2016-12-01 DIAGNOSIS — F329 Major depressive disorder, single episode, unspecified: Secondary | ICD-10-CM | POA: Diagnosis not present

## 2016-12-01 DIAGNOSIS — Z7982 Long term (current) use of aspirin: Secondary | ICD-10-CM | POA: Diagnosis not present

## 2016-12-01 DIAGNOSIS — E559 Vitamin D deficiency, unspecified: Secondary | ICD-10-CM | POA: Diagnosis not present

## 2016-12-01 DIAGNOSIS — Z880 Allergy status to penicillin: Secondary | ICD-10-CM | POA: Diagnosis not present

## 2016-12-01 DIAGNOSIS — I10 Essential (primary) hypertension: Secondary | ICD-10-CM | POA: Diagnosis not present

## 2016-12-01 DIAGNOSIS — Z6841 Body Mass Index (BMI) 40.0 and over, adult: Secondary | ICD-10-CM | POA: Diagnosis not present

## 2016-12-01 DIAGNOSIS — D1722 Benign lipomatous neoplasm of skin and subcutaneous tissue of left arm: Secondary | ICD-10-CM | POA: Diagnosis not present

## 2016-12-01 DIAGNOSIS — F419 Anxiety disorder, unspecified: Secondary | ICD-10-CM | POA: Diagnosis not present

## 2016-12-01 DIAGNOSIS — G4733 Obstructive sleep apnea (adult) (pediatric): Secondary | ICD-10-CM | POA: Diagnosis not present

## 2016-12-01 LAB — CBC
HCT: 38.9 % (ref 36.0–46.0)
HEMOGLOBIN: 12.5 g/dL (ref 12.0–15.0)
MCH: 31.6 pg (ref 26.0–34.0)
MCHC: 32.1 g/dL (ref 30.0–36.0)
MCV: 98.2 fL (ref 78.0–100.0)
Platelets: 230 10*3/uL (ref 150–400)
RBC: 3.96 MIL/uL (ref 3.87–5.11)
RDW: 12.9 % (ref 11.5–15.5)
WBC: 6.3 10*3/uL (ref 4.0–10.5)

## 2016-12-01 LAB — BASIC METABOLIC PANEL
ANION GAP: 8 (ref 5–15)
BUN: 17 mg/dL (ref 6–20)
CALCIUM: 8.9 mg/dL (ref 8.9–10.3)
CO2: 25 mmol/L (ref 22–32)
CREATININE: 1.12 mg/dL — AB (ref 0.44–1.00)
Chloride: 108 mmol/L (ref 101–111)
GFR calc Af Amer: >60 mL/min (ref >60)
GFR calc non Af Amer: 55 mL/min — ABNORMAL LOW (ref >60)
GLUCOSE: 109 mg/dL — AB (ref 65–99)
Potassium: 3.9 mmol/L (ref 3.5–5.1)
Sodium: 141 mmol/L (ref 135–145)

## 2016-12-01 MED ORDER — VANCOMYCIN HCL 10 G IV SOLR
1500.0000 mg | INTRAVENOUS | Status: AC
  Administered 2016-12-02: 1500 mg via INTRAVENOUS
  Filled 2016-12-01: qty 1500

## 2016-12-01 MED ORDER — SODIUM CHLORIDE 0.9 % IV SOLN: INTRAVENOUS | Status: DC

## 2016-12-01 NOTE — Progress Notes (Signed)
PCP is Debbrah Alar, NP States she saw Dr Gwenlyn Found in the past. Instructed to bring CPAP mask on the day of surgery. Stress test noted from 09-2014 Denies ever having a card cath or echo. Denies any chest pain, fever, or cough.

## 2016-12-01 NOTE — Telephone Encounter (Signed)
Patient is preop and Dr Durward Fortes had ordered TEDS for this patient, and short stay does not have any large enough for her.  They only have up to size XL.  She says patient will need at least a 2XL.

## 2016-12-01 NOTE — Pre-Procedure Instructions (Signed)
Jamilya Sarrazin  12/01/2016      [facility], [address]. [place]. [place] [address] Phone: [phone] Fax: [phone]    Your procedure is scheduled on Tuesday 12/02/2016.  Report to [facility] at 0800 A.M.  Call this number if you have problems the morning of surgery:  [phone]   Remember:  Do not eat food or drink liquids after midnight.   Continue all other medications as directed by your physician except follow these medication instructions before surgery   Take these medicines the morning of surgery with A SIP OF WATER: Fluoxetine (Prozac) Hydrocodone-acetaminophen (Norco) - if needed Loratidine (Claritin)- if needed Methocarbamol (Robaxin) Effexor  monifloxacin (Vigamox) eye drops   7 days prior to surgery STOP taking any Aspirin, Aleve, Naproxen, Ibuprofen, Motrin, Advil, Goody's, BC's, all herbal medications, fish oil, and all vitamins, including your Excedrin Migraine and Meloxicam (Mobic)    Do not wear jewelry, make-up or nail polish.  Do not wear lotions, powders, or perfumes, or deodorant.  Do not shave 48 hours prior to surgery.  Do not bring valuables to the hospital.  [facility] is not responsible for any belongings or valuables.  Contacts, eyeglasses, dentures or bridgework may not be worn into surgery.  Leave your suitcase in the car.  After surgery it may be brought to your room.  For patients admitted to the hospital, discharge time will be determined by your treatment team.  Patients discharged the day of surgery will not be allowed to drive home.   Name and phone number of your driver:    Special instructions:   [facility]- Preparing For Surgery  Before surgery, you can play an important role. Because skin is not sterile, your skin needs to be as free of germs as possible. You can reduce the number of germs on your skin by washing with CHG (chlorahexidine  gluconate) Soap before surgery.  CHG is an antiseptic cleaner which kills germs and bonds with the skin to continue killing germs even after washing.  Please do not use if you have an allergy to CHG or antibacterial soaps. If your skin becomes reddened/irritated stop using the CHG.  Do not shave (including legs and underarms) for at least 48 hours prior to first CHG shower. It is OK to shave your face.  Please follow these instructions carefully.   1. Shower the NIGHT BEFORE SURGERY and the MORNING OF SURGERY with CHG.   2. If you chose to wash your hair, wash your hair first as usual with your normal shampoo.  3. After you shampoo, rinse your hair and body thoroughly to remove the shampoo.  4. Use CHG as you would any other liquid soap. You can apply CHG directly to the skin and wash gently with a scrungie or a clean washcloth.   5. Apply the CHG Soap to your body ONLY FROM THE NECK DOWN.  Do not use on open wounds or open sores. Avoid contact with your eyes, ears, mouth and genitals (private parts). Wash genitals (private parts) with your normal soap.  6. Wash thoroughly, paying special attention to the area where your surgery will be performed.  7. Thoroughly rinse your body with warm water from the neck down.  8. DO NOT shower/wash with your normal soap after using and rinsing off the CHG Soap.  9. Pat yourself dry with a CLEAN TOWEL.   10. Wear CLEAN PAJAMAS  11. Place CLEAN SHEETS on your bed the night of your first shower and DO NOT SLEEP WITH PETS.    Day of Surgery: Do not apply any deodorants/lotions. Please wear clean clothes to the hospital/surgery center.      Please read over the following fact sheets that you were given. Pain Booklet, Coughing and Deep Breathing and Surgical Site Infection Prevention, and Incentive Spiromtery

## 2016-12-01 NOTE — Progress Notes (Signed)
Dr Rudene Anda office called and informed that we do not have any TEDS to fit Hailey Vance. Spoke with Abigail Butts.

## 2016-12-02 ENCOUNTER — Ambulatory Visit (HOSPITAL_COMMUNITY)
Admission: RE | Admit: 2016-12-02 | Discharge: 2016-12-02 | Disposition: A | Discharge status: Home / Self Care | Payer: BLUE CROSS/BLUE SHIELD | Source: Ambulatory Visit | Attending: Orthopaedic Surgery | Admitting: Orthopaedic Surgery

## 2016-12-02 ENCOUNTER — Encounter (HOSPITAL_COMMUNITY)
Admission: RE | Disposition: A | Discharge status: Home / Self Care | Payer: Self-pay | Source: Ambulatory Visit | Attending: Orthopaedic Surgery

## 2016-12-02 ENCOUNTER — Ambulatory Visit (HOSPITAL_COMMUNITY): Payer: BLUE CROSS/BLUE SHIELD | Admitting: Certified Registered Nurse Anesthetist

## 2016-12-02 ENCOUNTER — Encounter (HOSPITAL_COMMUNITY): Payer: Self-pay | Admitting: *Deleted

## 2016-12-02 DIAGNOSIS — E559 Vitamin D deficiency, unspecified: Secondary | ICD-10-CM | POA: Insufficient documentation

## 2016-12-02 DIAGNOSIS — N950 Postmenopausal bleeding: Secondary | ICD-10-CM | POA: Insufficient documentation

## 2016-12-02 DIAGNOSIS — F419 Anxiety disorder, unspecified: Secondary | ICD-10-CM | POA: Insufficient documentation

## 2016-12-02 DIAGNOSIS — I10 Essential (primary) hypertension: Secondary | ICD-10-CM | POA: Insufficient documentation

## 2016-12-02 DIAGNOSIS — G4733 Obstructive sleep apnea (adult) (pediatric): Secondary | ICD-10-CM | POA: Insufficient documentation

## 2016-12-02 DIAGNOSIS — Z7982 Long term (current) use of aspirin: Secondary | ICD-10-CM | POA: Insufficient documentation

## 2016-12-02 DIAGNOSIS — Z880 Allergy status to penicillin: Secondary | ICD-10-CM | POA: Insufficient documentation

## 2016-12-02 DIAGNOSIS — D1722 Benign lipomatous neoplasm of skin and subcutaneous tissue of left arm: Secondary | ICD-10-CM | POA: Diagnosis not present

## 2016-12-02 DIAGNOSIS — Z6841 Body Mass Index (BMI) 40.0 and over, adult: Secondary | ICD-10-CM | POA: Insufficient documentation

## 2016-12-02 DIAGNOSIS — F329 Major depressive disorder, single episode, unspecified: Secondary | ICD-10-CM | POA: Insufficient documentation

## 2016-12-02 DIAGNOSIS — Z79899 Other long term (current) drug therapy: Secondary | ICD-10-CM | POA: Insufficient documentation

## 2016-12-02 HISTORY — PX: LIPOMA EXCISION: SHX5283

## 2016-12-02 SURGERY — EXCISION LIPOMA
Anesthesia: General | Site: Shoulder | Laterality: Left

## 2016-12-02 MED ORDER — PHENYLEPHRINE HCL 10 MG/ML IJ SOLN
INTRAMUSCULAR | Status: DC | PRN
  Administered 2016-12-02 (×2): 80 ug via INTRAVENOUS

## 2016-12-02 MED ORDER — OXYCODONE HCL 5 MG PO TABS: 5.0000 mg | ORAL_TABLET | Freq: Once | ORAL | Status: DC | PRN

## 2016-12-02 MED ORDER — FENTANYL CITRATE (PF) 250 MCG/5ML IJ SOLN
INTRAMUSCULAR | Status: AC
  Filled 2016-12-02: qty 5

## 2016-12-02 MED ORDER — LACTATED RINGERS IV SOLN
INTRAVENOUS | Status: DC
  Administered 2016-12-02: 10:00:00 via INTRAVENOUS

## 2016-12-02 MED ORDER — FENTANYL CITRATE (PF) 100 MCG/2ML IJ SOLN
INTRAMUSCULAR | Status: AC
  Filled 2016-12-02: qty 2

## 2016-12-02 MED ORDER — FENTANYL CITRATE (PF) 100 MCG/2ML IJ SOLN: 25.0000 ug | INTRAMUSCULAR | Status: DC | PRN

## 2016-12-02 MED ORDER — LIDOCAINE HCL (CARDIAC) 20 MG/ML IV SOLN
INTRAVENOUS | Status: DC | PRN
  Administered 2016-12-02: 100 mg via INTRAVENOUS

## 2016-12-02 MED ORDER — OXYCODONE-ACETAMINOPHEN 5-325 MG PO TABS: 1.0000 | ORAL_TABLET | ORAL | 0 refills | Status: AC | PRN

## 2016-12-02 MED ORDER — ONDANSETRON HCL 4 MG/2ML IJ SOLN
INTRAMUSCULAR | Status: DC | PRN
  Administered 2016-12-02: 4 mg via INTRAVENOUS

## 2016-12-02 MED ORDER — BUPIVACAINE-EPINEPHRINE (PF) 0.25% -1:200000 IJ SOLN
INTRAMUSCULAR | Status: AC
  Filled 2016-12-02: qty 30

## 2016-12-02 MED ORDER — ROCURONIUM BROMIDE 100 MG/10ML IV SOLN
INTRAVENOUS | Status: DC | PRN
  Administered 2016-12-02: 50 mg via INTRAVENOUS

## 2016-12-02 MED ORDER — CHLORHEXIDINE GLUCONATE 4 % EX LIQD: 60.0000 mL | Freq: Once | CUTANEOUS | Status: DC

## 2016-12-02 MED ORDER — MIDAZOLAM HCL 5 MG/5ML IJ SOLN
INTRAMUSCULAR | Status: DC | PRN
  Administered 2016-12-02: 2 mg via INTRAVENOUS

## 2016-12-02 MED ORDER — MIDAZOLAM HCL 2 MG/2ML IJ SOLN
INTRAMUSCULAR | Status: AC
  Filled 2016-12-02: qty 2

## 2016-12-02 MED ORDER — PHENYLEPHRINE 40 MCG/ML (10ML) SYRINGE FOR IV PUSH (FOR BLOOD PRESSURE SUPPORT)
PREFILLED_SYRINGE | INTRAVENOUS | Status: AC
  Filled 2016-12-02: qty 10

## 2016-12-02 MED ORDER — LIDOCAINE 2% (20 MG/ML) 5 ML SYRINGE
INTRAMUSCULAR | Status: AC
  Filled 2016-12-02: qty 5

## 2016-12-02 MED ORDER — PROPOFOL 10 MG/ML IV BOLUS
INTRAVENOUS | Status: DC | PRN
  Administered 2016-12-02: 200 mg via INTRAVENOUS

## 2016-12-02 MED ORDER — CHLORHEXIDINE GLUCONATE 4 % EX LIQD: 60.0000 mL | Freq: Every day | CUTANEOUS | Status: DC

## 2016-12-02 MED ORDER — ALBUTEROL SULFATE (2.5 MG/3ML) 0.083% IN NEBU
INHALATION_SOLUTION | RESPIRATORY_TRACT | Status: AC
  Administered 2016-12-02: 2.5 mg
  Filled 2016-12-02: qty 3

## 2016-12-02 MED ORDER — OXYCODONE HCL 5 MG/5ML PO SOLN: 5.0000 mg | Freq: Once | ORAL | Status: DC | PRN

## 2016-12-02 MED ORDER — EPHEDRINE SULFATE 50 MG/ML IJ SOLN
INTRAMUSCULAR | Status: DC | PRN
  Administered 2016-12-02: 10 mg via INTRAVENOUS

## 2016-12-02 MED ORDER — FENTANYL CITRATE (PF) 100 MCG/2ML IJ SOLN
INTRAMUSCULAR | Status: DC | PRN
  Administered 2016-12-02: 100 ug via INTRAVENOUS
  Administered 2016-12-02: 50 ug via INTRAVENOUS
  Administered 2016-12-02: 100 ug via INTRAVENOUS

## 2016-12-02 MED ORDER — ONDANSETRON HCL 4 MG/2ML IJ SOLN: 4.0000 mg | Freq: Four times a day (QID) | INTRAMUSCULAR | Status: DC | PRN

## 2016-12-02 MED ORDER — SUGAMMADEX SODIUM 200 MG/2ML IV SOLN
INTRAVENOUS | Status: DC | PRN
  Administered 2016-12-02: 500 mg via INTRAVENOUS

## 2016-12-02 MED ORDER — ALBUTEROL SULFATE (2.5 MG/3ML) 0.083% IN NEBU: 2.5000 mg | INHALATION_SOLUTION | Freq: Four times a day (QID) | RESPIRATORY_TRACT | Status: DC | PRN

## 2016-12-02 MED ORDER — ONDANSETRON HCL 4 MG/2ML IJ SOLN
INTRAMUSCULAR | Status: AC
  Filled 2016-12-02: qty 2

## 2016-12-02 SURGICAL SUPPLY — 41 items
APL SKNCLS STERI-STRIP NONHPOA (GAUZE/BANDAGES/DRESSINGS) ×1
BAG DECANTER FOR FLEXI CONT (MISCELLANEOUS) ×2 IMPLANT
BANDAGE ACE 4X5 VEL STRL LF (GAUZE/BANDAGES/DRESSINGS) ×2 IMPLANT
BENZOIN TINCTURE PRP APPL 2/3 (GAUZE/BANDAGES/DRESSINGS) ×1 IMPLANT
BNDG COHESIVE 4X5 TAN STRL (GAUZE/BANDAGES/DRESSINGS) ×2 IMPLANT
BNDG GAUZE ELAST 4 BULKY (GAUZE/BANDAGES/DRESSINGS) ×2 IMPLANT
CLSR STERI-STRIP ANTIMIC 1/2X4 (GAUZE/BANDAGES/DRESSINGS) ×1 IMPLANT
CUFF TOURNIQUET SINGLE 34IN LL (TOURNIQUET CUFF) IMPLANT
CUFF TOURNIQUET SINGLE 44IN (TOURNIQUET CUFF) IMPLANT
DRSG ADAPTIC 3X8 NADH LF (GAUZE/BANDAGES/DRESSINGS) ×2 IMPLANT
DRSG EMULSION OIL 3X3 NADH (GAUZE/BANDAGES/DRESSINGS) ×2 IMPLANT
DRSG PAD ABDOMINAL 8X10 ST (GAUZE/BANDAGES/DRESSINGS) ×4 IMPLANT
DURAPREP 26ML APPLICATOR (WOUND CARE) ×3 IMPLANT
ELECT REM PT RETURN 9FT ADLT (ELECTROSURGICAL)
ELECTRODE REM PT RTRN 9FT ADLT (ELECTROSURGICAL) IMPLANT
GAUZE SPONGE 4X4 12PLY STRL (GAUZE/BANDAGES/DRESSINGS) ×2 IMPLANT
GLOVE BIOGEL PI IND STRL 8 (GLOVE) ×1 IMPLANT
GLOVE BIOGEL PI INDICATOR 8 (GLOVE) ×1
GLOVE ECLIPSE 8.0 STRL XLNG CF (GLOVE) ×2 IMPLANT
GOWN STRL REUS W/ TWL LRG LVL3 (GOWN DISPOSABLE) ×2 IMPLANT
GOWN STRL REUS W/TWL LRG LVL3 (GOWN DISPOSABLE) ×4
HANDPIECE INTERPULSE COAX TIP (DISPOSABLE)
KIT BASIN OR (CUSTOM PROCEDURE TRAY) ×2 IMPLANT
KIT ROOM TURNOVER OR (KITS) ×2 IMPLANT
MANIFOLD NEPTUNE II (INSTRUMENTS) ×2 IMPLANT
NS IRRIG 1000ML POUR BTL (IV SOLUTION) ×2 IMPLANT
PACK ORTHO EXTREMITY (CUSTOM PROCEDURE TRAY) ×2 IMPLANT
PAD ARMBOARD 7.5X6 YLW CONV (MISCELLANEOUS) ×4 IMPLANT
PENCIL BUTTON HOLSTER BLD 10FT (ELECTRODE) IMPLANT
SET HNDPC FAN SPRY TIP SCT (DISPOSABLE) IMPLANT
SPONGE LAP 18X18 X RAY DECT (DISPOSABLE) ×2 IMPLANT
SPONGE LAP 4X18 X RAY DECT (DISPOSABLE) ×2 IMPLANT
STOCKINETTE IMPERVIOUS 9X36 MD (GAUZE/BANDAGES/DRESSINGS) ×2 IMPLANT
SUT ETHILON 3 0 PS 1 (SUTURE) IMPLANT
SWAB CULTURE ESWAB REG 1ML (MISCELLANEOUS) IMPLANT
TOWEL OR 17X24 6PK STRL BLUE (TOWEL DISPOSABLE) ×2 IMPLANT
TOWEL OR 17X26 10 PK STRL BLUE (TOWEL DISPOSABLE) ×2 IMPLANT
TUBE CONNECTING 12X1/4 (SUCTIONS) ×2 IMPLANT
UNDERPAD 30X30 (UNDERPADS AND DIAPERS) ×2 IMPLANT
WATER STERILE IRR 1000ML POUR (IV SOLUTION) ×2 IMPLANT
YANKAUER SUCT BULB TIP NO VENT (SUCTIONS) ×2 IMPLANT

## 2016-12-02 NOTE — Telephone Encounter (Signed)
Hailey Vance at [facility] and she will check with facilities for the 2XLTed hose.None in OR or short stay

## 2016-12-02 NOTE — Anesthesia Preprocedure Evaluation (Addendum)
Anesthesia Evaluation  Patient identified by MRN, date of birth, ID band Patient awake    Reviewed: Allergy & Precautions, H&P , NPO status , Patient's Chart, lab work & pertinent test results  Airway Mallampati: II   Neck ROM: full    Dental  (+) Teeth Intact, Dental Advisory Given   Pulmonary sleep apnea ,    breath sounds clear to auscultation       Cardiovascular hypertension,  Rhythm:regular Rate:Normal     Neuro/Psych  Headaches, PSYCHIATRIC DISORDERS Anxiety Depression    GI/Hepatic   Endo/Other  Morbid obesity  Renal/GU      Musculoskeletal  (+) Arthritis ,   Abdominal   Peds  Hematology   Anesthesia Other Findings   Reproductive/Obstetrics                            Anesthesia Physical Anesthesia Plan  ASA: II  Anesthesia Plan: General   Post-op Pain Management:    Induction: Intravenous  PONV Risk Score and Plan: 3 and Ondansetron, Dexamethasone, Midazolam and Propofol infusion  Airway Management Planned: Oral ETT  Additional Equipment:   Intra-op Plan:   Post-operative Plan: Extubation in OR  Informed Consent: I have reviewed the patients History and Physical, chart, labs and discussed the procedure including the risks, benefits and alternatives for the proposed anesthesia with the patient or authorized representative who has indicated his/her understanding and acceptance.     Plan Discussed with: CRNA, Anesthesiologist and Surgeon  Anesthesia Plan Comments:         Anesthesia Quick Evaluation

## 2016-12-02 NOTE — Op Note (Signed)
PATIENT ID:      Hailey Vance  MRN:     [id number] DOB/AGE:    04-26-1965 / 51 y.o.       OPERATIVE REPORT    DATE OF PROCEDURE:  12/02/2016       PREOPERATIVE DIAGNOSIS:SUB DELTOID LIPOMA   LEFT SHOULDER                                                        Estimated body mass index is 58.21 kg/m as calculated from the following:   Height as of 12/01/16: 5\' 4"  (1.626 m).   Weight as of 12/01/16: 339 lb 2 oz (153.8 kg).     POSTOPERATIVE DIAGNOSIS:   LEFT SHOULDER LIPOMA -SAME                                                                    Estimated body mass index is 58.21 kg/m as calculated from the following:   Height as of 12/01/16: 5\' 4"  (1.626 m).   Weight as of 12/01/16: 339 lb 2 oz (153.8 kg).     PROCEDURE:  Procedure(s): EXCISIONAL BIOPSY LIPOMA OF LEFT SHOULDER      SURGEON:  Joni Fears, MD    ASSISTANT:   Biagio Borg, PA-C   (Present and scrubbed throughout the case, critical for assistance with exposure, retraction, instrumentation, and closure.)          ANESTHESIA: general     DRAINS: none :      TOURNIQUET TIME: * No tourniquets in log *    COMPLICATIONS:  None   CONDITION:  stable  PROCEDURE IN DETAIL: [name] 12/02/2016, 11:34 AM

## 2016-12-02 NOTE — Progress Notes (Signed)
Patient C/O SOB & wheezing, MD notified and orders for albuterol TM. Daughter also is concerned with bruising on left neck and left upper shoulder, MD notified to come speak with patient and daughter.

## 2016-12-02 NOTE — Transfer of Care (Signed)
Immediate Anesthesia Transfer of Care Note  Patient: Hailey Vance  Procedure(s) Performed: EXCISION LIPOMA OF LEFT SHOULDER (Left Shoulder)  Patient Location: PACU  Anesthesia Type:General  Level of Consciousness: awake, alert  and oriented  Airway & Oxygen Therapy: Patient Spontanous Breathing and Patient connected to nasal cannula oxygen  Post-op Assessment: Report given to RN, Post -op Vital signs reviewed and stable and Patient moving all extremities  Post vital signs: Reviewed and stable  Last Vitals:  Vitals:   12/02/16 0830 12/02/16 1207  BP: (!) 154/79 (!) 165/99  Pulse: 83 93  Resp: 19 15  Temp: 37.1 C 36.7 C  SpO2: 97% 99%    Last Pain:  Vitals:   12/02/16 1207  TempSrc:   PainSc: 0-No pain         Complications: No apparent anesthesia complications

## 2016-12-02 NOTE — H&P (Signed)
The recent History & Physical has been reviewed. I have personally examined the patient today. There is no interval change to the documented History & Physical. The patient would like to proceed with the procedure.  Garald Balding 12/02/2016,  9:36 AM

## 2016-12-02 NOTE — Anesthesia Procedure Notes (Signed)
Procedure Name: Intubation Date/Time: 12/02/2016 10:43 AM Performed by: Ollen Bowl Pre-anesthesia Checklist: Patient identified, Emergency Drugs available, Suction available and Patient being monitored Patient Re-evaluated:Patient Re-evaluated prior to induction Oxygen Delivery Method: Circle system utilized Preoxygenation: Pre-oxygenation with 100% oxygen Induction Type: IV induction Ventilation: Mask ventilation without difficulty Laryngoscope Size: Miller and 2 Grade View: Grade I Tube type: Oral Tube size: 7.5 mm Number of attempts: 1 Airway Equipment and Method: Patient positioned with wedge pillow and Stylet Placement Confirmation: ETT inserted through vocal cords under direct vision,  positive ETCO2 and breath sounds checked- equal and bilateral Secured at: 23 cm Tube secured with: Tape Dental Injury: Teeth and Oropharynx as per pre-operative assessment

## 2016-12-02 NOTE — Telephone Encounter (Signed)
What to do??

## 2016-12-02 NOTE — Progress Notes (Signed)
Patient voided, family states they feel comfortable to be discharged , no further questions at this time, MD aware.

## 2016-12-02 NOTE — Telephone Encounter (Signed)
See if they can get them

## 2016-12-03 ENCOUNTER — Encounter (HOSPITAL_COMMUNITY): Payer: Self-pay | Admitting: Orthopaedic Surgery

## 2016-12-03 NOTE — Anesthesia Postprocedure Evaluation (Signed)
Anesthesia Post Note  Patient: Hailey Vance  Procedure(s) Performed: EXCISION LIPOMA OF LEFT SHOULDER (Left Shoulder)     Patient location during evaluation: PACU Anesthesia Type: General Level of consciousness: awake and alert Pain management: pain level controlled Vital Signs Assessment: post-procedure vital signs reviewed and stable Respiratory status: spontaneous breathing, nonlabored ventilation, respiratory function stable and patient connected to nasal cannula oxygen Cardiovascular status: blood pressure returned to baseline and stable Postop Assessment: no apparent nausea or vomiting Anesthetic complications: no    Last Vitals:  Vitals:   12/02/16 1235 12/02/16 1319  BP: (!) 160/96   Pulse: 91   Resp: 14   Temp: 36.7 C   SpO2: 94% 94%    Last Pain:  Vitals:   12/02/16 1230  TempSrc:   PainSc: 0-No pain                 Hallie Ishida S

## 2016-12-03 NOTE — Op Note (Signed)
NAMEELVERIA, Hailey Vance NO.:  [id number]  MEDICAL RECORD NO.:  [id number]  LOCATION:  [facility]                         FACILITY:  [facility]  PHYSICIAN:  Vonna Kotyk. Whitfield, M.D.DATE OF BIRTH:  Jul 19, 1965  DATE OF PROCEDURE:  12/02/2016 DATE OF DISCHARGE:                              OPERATIVE REPORT   PREOPERATIVE DIAGNOSIS:  Subdeltoid lipoma, left shoulder.  POSTOPERATIVE DIAGNOSIS:  Subdeltoid lipoma, left shoulder.  PROCEDURE:  Excisional biopsy, lipoma, left shoulder.  SURGEON:  Vonna Kotyk. Durward Fortes, M.D.  ASSISTANT:  Aaron Edelman D. Petrarca, P.A.-C.  ANESTHESIA:  General.  COMPLICATIONS:  None.  HISTORY:  A 52 year old female who was noted an enlarging mass in the left shoulder over period of many months.  She has had an MRI scan demonstrating an 11 x 11-cm mass consistent with a lipoma.  With persistent pain and loss of function, she is now to have excisional biopsy.  DESCRIPTION OF PROCEDURE:  [name] was met with her daughter in the holding area, identified the left shoulder as appropriate operative site and marked it accordingly.  The patient was then transported to room #7, carefully placed on the operating table and placed under general anesthesia without difficulty.  She was then placed in the semi-sitting position with a shoulder frame.  The left upper extremity was then prepped with DuraPrep from the base of the neck circumferentially to the wrist.  Sterile draping was performed.  Time-out was called.  A skin incision was outlined along the deltopectoral groove via sharp dissection, carried down to subcutaneous tissue.  Abundant adipose tissue was carefully incised and then Bovie coagulated.  Had a nice dry field.  About 5-mm in the deltopectoral groove, I could palpate the lipoma beneath the deltoid.  I made a longitudinal incision exposing the mass.  With blunt retraction placed beneath the deltoid, I then manually dissected the tumor from its  anterior-to-posterolateral extent.  I used the gloved hand to release any adhesions.  There was very little bleeding and no main vessels penetrating the mass.  There were no obvious degenerative changes or malignant changes within the mass.  I removed the mass essentially in one large piece.  There was some remaining lipoma posterolaterally and any visual portions were removed. I then packed the wound for about 5 minutes and then visualized the wound without evidence of any bleeding.  The wound was irrigated with saline solution.  We did infiltrate the operative site with 0.25% Marcaine with epinephrine.  Deltoid muscle appeared to be intact. Circumflex vessels were identified and also remained intact.  I could palpate the deltoid tendon in its groove.  The deltopectoral groove was then closed with a running 0 Vicryl.  We again irrigated the wound, closed the next several layers of 2-0 Vicryl and 3- 0 Monocryl, and then skin clips.  Sterile bulky dressing was applied followed by sling.  Plan, oxycodone for pain, office 1 week.  Specimen was sent to the lab for cytology.     Vonna Kotyk. Durward Fortes, M.D.     PWW/MEDQ  D:  12/02/2016  T:  12/03/2016  Job:  [id number]

## 2016-12-05 ENCOUNTER — Institutional Professional Consult (permissible substitution): Payer: BLUE CROSS/BLUE SHIELD | Admitting: Pulmonary Disease

## 2016-12-08 ENCOUNTER — Ambulatory Visit (INDEPENDENT_AMBULATORY_CARE_PROVIDER_SITE_OTHER): Payer: BLUE CROSS/BLUE SHIELD | Admitting: Orthopaedic Surgery

## 2016-12-08 VITALS — BP 148/90 | HR 79 | Ht 64.0 in | Wt 337.0 lb

## 2016-12-08 DIAGNOSIS — M25512 Pain in left shoulder: Secondary | ICD-10-CM

## 2016-12-08 NOTE — Progress Notes (Signed)
Office Visit Note   Patient: Hailey Vance           Date of Birth: 14-Jul-1965           MRN: [id number] Visit Date: 12/08/2016              Requested by: Debbrah Alar, NP [address] [place], [place] [address] PCP: Debbrah Alar, NP   Assessment & Plan: Visit Diagnoses:  1. Left shoulder pain, unspecified chronicity     Plan: 6 days status post excision of a very large lipoma from the left shoulder. It measured 11 x 11 cm. Pathology report was consistent with a lipoma. She's doing well not having any fever or chills and minimal discomfort. Dressing change. Physical therapy for range of motion exercises  Follow-Up Instructions: Return in about 1 week (around 12/15/2016).   Orders:  Orders Placed This Encounter  Procedures  . Ambulatory referral to Physical Therapy   No orders of the defined types were placed in this encounter.     Procedures: No procedures performed   Clinical Data: No additional findings.   Subjective: No chief complaint on file. Awake alert and oriented 3. Comfortable sitting. Left shoulder deltopectoral groove incision healing without a problem. Good sensibility about the shoulder. Unable to abduct 90 and flex about 130 passively. Ecchymosis identified postop Truman Hayward which is normal. Good grip and good release. Neurovascular exam intact.  HPI  Review of Systems   Objective: Vital Signs: BP (!) 148/90 (BP Location: Right Arm, Patient Position: Sitting, Cuff Size: Normal)   Pulse 79   Ht 5\' 4"  (1.626 m)   Wt (!) 337 lb (152.9 kg)   LMP 10/02/2007 Comment: but had bleeding 18 months ago for short period of time and began again 08/2013  BMI 57.85 kg/m   Physical Exam  Ortho Exam awake alert and oriented 3 comfortable sitting. Deltopectoral groove incision is healing without problem left shoulder. No drainage from her wound. Good grip and good release. Ecchymosis from surgery is identified along the arm. No hematoma  formation. I can abduct 90 passively and flex about 130 passively negative impingement  Specialty Comments:  No specialty comments available.  Imaging: No results found.   PMFS History: Patient Active Problem List   Diagnosis Date Noted  . Lipoma of left shoulder 12/02/2016  . BMI 50.0-59.9, adult ([place])   . Preventative health care 10/10/2014  . HTN (hypertension) 09/06/2014  . Osteoarthritis 09/06/2014  . Post-menopausal bleeding 09/06/2014  . OSA (obstructive sleep apnea) 09/06/2014  . Anxiety and depression 09/06/2014  . Vitamin D deficiency 09/06/2014  . Chronic back pain    Past Medical History:  Diagnosis Date  . Anxiety   . Arthritis    osteoarthritis - knees  . Blood transfusion 1990   In [place], [place] with C/S Surgery - 5 units transfused  . Blood transfusion without reported diagnosis 1990   after a c/section  . Chest pain   . Chronic back pain   . Depression   . Fibroid   . Headache(784.0)    otc med prn  . Hypertension   . Low blood potassium   . Plantar fasciitis of left foot   . Sleep apnea    CPAP  . Vitamin D deficiency 09/06/2014    Family History  Problem Relation Age of Onset  . Lung cancer Mother 31       dec  . Cancer Mother   . Hypertension Mother   .  Arthritis Mother   . Cancer Father 13       prostate  . Hypertension Father   . Leukemia Sister   . Hypertension Sister   . Arthritis Sister   . Hyperlipidemia Sister   . Hypertension Brother   . Arthritis Brother   . Multiple sclerosis Sister   . Hypertension Sister   . Hypertension Sister   . Hypertension Brother   . Hypertension Brother   . Breast cancer Paternal Aunt   . Diabetes Maternal Grandmother   . Stroke Maternal Grandmother   . Heart attack Paternal Grandmother     Past Surgical History:  Procedure Laterality Date  . BILATERAL OOPHORECTOMY     Complete removal of left ovary and partial removal of right ovary.  . CESAREAN SECTION  1990   x 1  . COLONOSCOPY WITH  PROPOFOL N/A 08/30/2015   Procedure: COLONOSCOPY WITH PROPOFOL;  Surgeon: Mauri Pole, MD;  Location: [facility];  Service: Endoscopy;  Laterality: N/A;  . CYSTOSCOPY N/A 05/23/2015   Procedure: CYSTOSCOPY;  Surgeon: Bobbye Charleston, MD;  Location: [facility];  Service: Gynecology;  Laterality: N/A;  . HERNIA REPAIR    . HYSTEROSCOPY W/D&C N/A 01/11/2015   Procedure: DILATATION AND CURETTAGE /HYSTEROSCOPY;  Surgeon: Jerelyn Charles, MD;  Location: [facility];  Service: Gynecology;  Laterality: N/A;  . KNEE SURGERY     right knee  . LIPOMA EXCISION Left 12/02/2016   Procedure: EXCISION LIPOMA OF LEFT SHOULDER;  Surgeon: Garald Balding, MD;  Location: [facility];  Service: Orthopedics;  Laterality: Left;  . ROBOTIC ASSISTED TOTAL HYSTERECTOMY WITH SALPINGECTOMY Bilateral 05/23/2015   Procedure: ROBOTIC ASSISTED TOTAL HYSTERECTOMY WITH SALPINGECTOMYwith right oophorectomy;  Surgeon: Bobbye Charleston, MD;  Location: [facility];  Service: Gynecology;  Laterality: Bilateral;  . TUBAL LIGATION  1990   Social History   Occupational History  . Not on file.   Social History Main Topics  . Smoking status: Never Smoker  . Smokeless tobacco: Never Used  . Alcohol use No  . Drug use: No  . Sexual activity: Not Currently    Partners: Male    Birth control/ protection: Surgical     Comment: Tubal     Garald Balding, MD   Note - This record has been created using [employer].  Chart creation errors have been sought, but may not always  have been located. Such creation errors do not reflect on  the standard of medical care.

## 2016-12-15 ENCOUNTER — Encounter (INDEPENDENT_AMBULATORY_CARE_PROVIDER_SITE_OTHER): Payer: Self-pay | Admitting: Orthopaedic Surgery

## 2016-12-15 ENCOUNTER — Ambulatory Visit (INDEPENDENT_AMBULATORY_CARE_PROVIDER_SITE_OTHER): Payer: BLUE CROSS/BLUE SHIELD | Admitting: Orthopaedic Surgery

## 2016-12-15 VITALS — BP 134/85 | HR 88 | Resp 14 | Ht 67.0 in | Wt 337.0 lb

## 2016-12-15 DIAGNOSIS — G8929 Other chronic pain: Secondary | ICD-10-CM

## 2016-12-15 DIAGNOSIS — M25512 Pain in left shoulder: Secondary | ICD-10-CM
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
## 2016-12-16 ENCOUNTER — Telehealth: Payer: Self-pay

## 2016-12-16 NOTE — Telephone Encounter (Signed)
Spoke with pt after a  call from [facility]. There is no record of a return to work

## 2017-01-16 ENCOUNTER — Encounter (INDEPENDENT_AMBULATORY_CARE_PROVIDER_SITE_OTHER): Payer: Self-pay | Admitting: Orthopaedic Surgery

## 2017-01-16 ENCOUNTER — Ambulatory Visit (INDEPENDENT_AMBULATORY_CARE_PROVIDER_SITE_OTHER): Payer: BLUE CROSS/BLUE SHIELD | Admitting: Orthopaedic Surgery

## 2017-01-16 VITALS — BP 140/86 | HR 82 | Resp 17 | Ht 64.0 in | Wt 330.0 lb

## 2017-01-16 DIAGNOSIS — G8929 Other chronic pain: Secondary | ICD-10-CM

## 2017-01-16 DIAGNOSIS — M25512 Pain in left shoulder: Secondary | ICD-10-CM

## 2017-01-16 NOTE — Progress Notes (Signed)
Office Visit Note   Patient: Hailey Vance           Date of Birth: 02/12/1966           MRN: [id number] Visit Date: 01/16/2017              Requested by: Debbrah Alar, NP [address] [place], [place] [address] PCP: Debbrah Alar, NP   Assessment & Plan: Visit Diagnoses:  1. Chronic left shoulder pain     Plan: 6 weeks status post resection of a very large lipoma left shoulder. Approximately 10 cm in diameter. Hailey Vance is doing well with minimal discomfort and increase function of her left shoulder. We'll plan a course of physical therapy and see back in 4 weeks. Hailey Vance works at [employer] and [employer] feel like she is ready return to work as there is some heavy lifting and follow-up. We'll reevaluate her work status in 1 month after the therapy  Follow-Up Instructions: Return in about 6 weeks (around 02/27/2017).   Orders:  No orders of the defined types were placed in this encounter.  No orders of the defined types were placed in this encounter.     Procedures: No procedures performed   Clinical Data: No additional findings.   Subjective: No chief complaint on file. 6 weeks status post resection of a very large lipoma from the left shoulder. Hailey Vance is doing well. She denies shortness of breath or chest pain. She's had a little bit of numbness along the incision and. She also feels like she is "weak". Not having much pain.  HPI  Review of Systems   Objective: Vital Signs: LMP 10/02/2007 Comment: but had bleeding 18 months ago for short period of time and began again 08/2013  Physical Exam  Ortho Exam awake alert and oriented 3. Comfortable sitting. Incision left shoulder and the deltopectoral groove is healing without problem. Early keloid but no pain. Abducts 90 without problem and just about full overhead motion. Still feels a little bit weak. Neurovascular exam intact. Good grip and good release  Specialty Comments:  No  specialty comments available.  Imaging: No results found.   PMFS History: Patient Active Problem List   Diagnosis Date Noted  . Lipoma of left shoulder 12/02/2016  . BMI 50.0-59.9, adult ([facility])   . Preventative health care 10/10/2014  . HTN (hypertension) 09/06/2014  . Osteoarthritis 09/06/2014  . Post-menopausal bleeding 09/06/2014  . OSA (obstructive sleep apnea) 09/06/2014  . Anxiety and depression 09/06/2014  . Vitamin D deficiency 09/06/2014  . Chronic back pain    Past Medical History:  Diagnosis Date  . Anxiety   . Arthritis    osteoarthritis - knees  . Blood transfusion 1990   In [place], [place] with C/S Surgery - 5 units transfused  . Blood transfusion without reported diagnosis 1990   after a c/section  . Chest pain   . Chronic back pain   . Depression   . Fibroid   . Headache(784.0)    otc med prn  . Hypertension   . Low blood potassium   . Plantar fasciitis of left foot   . Sleep apnea    CPAP  . Vitamin D deficiency 09/06/2014    Family History  Problem Relation Age of Onset  . Lung cancer Mother 49       dec  . Cancer Mother   . Hypertension Mother   . Arthritis Mother   . Cancer Father 39  prostate  . Hypertension Father   . Leukemia Sister   . Hypertension Sister   . Arthritis Sister   . Hyperlipidemia Sister   . Hypertension Brother   . Arthritis Brother   . Multiple sclerosis Sister   . Hypertension Sister   . Hypertension Sister   . Hypertension Brother   . Hypertension Brother   . Breast cancer Paternal Aunt   . Diabetes Maternal Grandmother   . Stroke Maternal Grandmother   . Heart attack Paternal Grandmother     Past Surgical History:  Procedure Laterality Date  . BILATERAL OOPHORECTOMY     Complete removal of left ovary and partial removal of right ovary.  . CESAREAN SECTION  1990   x 1  . COLONOSCOPY WITH PROPOFOL N/A 08/30/2015   Performed by Mauri Pole, MD at [facility]  . CYSTOSCOPY N/A 05/23/2015    Performed by Bobbye Charleston, MD at [facility]  . DILATATION AND CURETTAGE /HYSTEROSCOPY N/A 01/11/2015   Performed by Jerelyn Charles, MD at [facility]  . EXCISION LIPOMA OF LEFT SHOULDER Left 12/02/2016   Performed by Garald Balding, MD at [facility]  . HERNIA REPAIR    . KNEE SURGERY     right knee  . ROBOTIC ASSISTED TOTAL HYSTERECTOMY WITH SALPINGECTOMYwith right oophorectomy Bilateral 05/23/2015   Performed by Bobbye Charleston, MD at [facility]  . TUBAL LIGATION  1990   Social History   Occupational History  . Not on file  Tobacco Use  . Smoking status: Never Smoker  . Smokeless tobacco: Never Used  Substance and Sexual Activity  . Alcohol use: No    Alcohol/week: 0.0 oz  . Drug use: No  . Sexual activity: Not Currently    Partners: Male    Birth control/protection: Surgical    Comment: Tubal     Garald Balding, MD   Note - This record has been created using [employer].  Chart creation errors have been sought, but may not always  have been located. Such creation errors do not reflect on  the standard of medical care.

## 2017-01-16 NOTE — Progress Notes (Deleted)
Office Visit Note   Patient: Hailey Vance           Date of Birth: [birth year]           MRN: [id number] Visit Date: 01/16/2017              Requested by: Debbrah Alar, NP [address] [place],  [id number] PCP: Debbrah Alar, NP   Assessment & Plan: Visit Diagnoses: No diagnosis found.  Plan: ***  Follow-Up Instructions: No Follow-up on file.   Orders:  No orders of the defined types were placed in this encounter.  No orders of the defined types were placed in this encounter.     Procedures: No procedures performed   Clinical Data: No additional findings.   Subjective: No chief complaint on file.   HPI  Review of Systems  Respiratory: Positive for shortness of breath.   Neurological: Positive for numbness and headaches.  All other systems reviewed and are negative.    Objective: Vital Signs: LMP 10/02/2007 Comment: but had bleeding 18 months ago for short period of time and began again 08/2013  Physical Exam  Ortho Exam  Specialty Comments:  No specialty comments available.  Imaging: No results found.   PMFS History: Patient Active Problem List   Diagnosis Date Noted  . Lipoma of left shoulder 12/02/2016  . BMI 50.0-59.9, adult ([place])   . Preventative health care 10/10/2014  . HTN (hypertension) 09/06/2014  . Osteoarthritis 09/06/2014  . Post-menopausal bleeding 09/06/2014  . OSA (obstructive sleep apnea) 09/06/2014  . Anxiety and depression 09/06/2014  . Vitamin D deficiency 09/06/2014  . Chronic back pain    Past Medical History:  Diagnosis Date  . Anxiety   . Arthritis    osteoarthritis - knees  . Blood transfusion 1990   In [place], [place] with C/S Surgery - 5 units transfused  . Blood transfusion without reported diagnosis 1990   after a c/section  . Chest pain   . Chronic back pain   . Depression   . Fibroid   . Headache(784.0)    otc med prn  . Hypertension   . Low blood potassium   .  Plantar fasciitis of left foot   . Sleep apnea    CPAP  . Vitamin D deficiency 09/06/2014    Family History  Problem Relation Age of Onset  . Lung cancer Mother 96       dec  . Cancer Mother   . Hypertension Mother   . Arthritis Mother   . Cancer Father 42       prostate  . Hypertension Father   . Leukemia Sister   . Hypertension Sister   . Arthritis Sister   . Hyperlipidemia Sister   . Hypertension Brother   . Arthritis Brother   . Multiple sclerosis Sister   . Hypertension Sister   . Hypertension Sister   . Hypertension Brother   . Hypertension Brother   . Breast cancer Paternal Aunt   . Diabetes Maternal Grandmother   . Stroke Maternal Grandmother   . Heart attack Paternal Grandmother     Past Surgical History:  Procedure Laterality Date  . BILATERAL OOPHORECTOMY     Complete removal of left ovary and partial removal of right ovary.  . CESAREAN SECTION  1990   x 1  . COLONOSCOPY WITH PROPOFOL N/A 08/30/2015   Performed by Mauri Pole, MD at [place]  . CYSTOSCOPY N/A 05/23/2015   Performed  by Bobbye Charleston, MD at [facility] ORS  . DILATATION AND CURETTAGE /HYSTEROSCOPY N/A 01/11/2015   Performed by Jerelyn Charles, MD at [facility] ORS  . EXCISION LIPOMA OF LEFT SHOULDER Left 12/02/2016   Performed by Garald Balding, MD at [place]  . HERNIA REPAIR    . KNEE SURGERY     right knee  . ROBOTIC ASSISTED TOTAL HYSTERECTOMY WITH SALPINGECTOMYwith right oophorectomy Bilateral 05/23/2015   Performed by Bobbye Charleston, MD at [facility]  . TUBAL LIGATION  1990   Social History   Occupational History  . Not on file  Tobacco Use  . Smoking status: Never Smoker  . Smokeless tobacco: Never Used  Substance and Sexual Activity  . Alcohol use: No    Alcohol/week: 0.0 oz  . Drug use: No  . Sexual activity: Not Currently    Partners: Male    Birth control/protection: Surgical    Comment: Tubal

## 2017-01-27 ENCOUNTER — Telehealth: Payer: Self-pay | Admitting: Physical Therapy

## 2017-01-27 NOTE — Telephone Encounter (Signed)
Left message on 11/19 & 01/26/17 to schedule PT eval. No return call

## 2017-02-13 ENCOUNTER — Ambulatory Visit (INDEPENDENT_AMBULATORY_CARE_PROVIDER_SITE_OTHER): Payer: BLUE CROSS/BLUE SHIELD | Admitting: Orthopaedic Surgery

## 2017-02-13 ENCOUNTER — Encounter (INDEPENDENT_AMBULATORY_CARE_PROVIDER_SITE_OTHER): Payer: Self-pay | Admitting: Orthopaedic Surgery

## 2017-02-13 DIAGNOSIS — G8929 Other chronic pain: Secondary | ICD-10-CM

## 2017-02-13 DIAGNOSIS — M25562 Pain in left knee: Secondary | ICD-10-CM

## 2017-02-13 DIAGNOSIS — M25561 Pain in right knee: Secondary | ICD-10-CM | POA: Diagnosis not present

## 2017-02-13 DIAGNOSIS — M25512 Pain in left shoulder: Secondary | ICD-10-CM | POA: Diagnosis not present

## 2017-02-13 NOTE — Progress Notes (Signed)
Office Visit Note   Patient: Hailey Vance           Date of Birth: 22-Nov-1965           MRN: [id number] Visit Date: 02/13/2017              Requested by: Debbrah Alar, NP [address] [place], [place] [address] PCP: Debbrah Alar, NP   Assessment & Plan: Visit Diagnoses:  1. Chronic left shoulder pain   2. Chronic pain of both knees     Plan: 2 months status post excision of a large lipoma from the left shoulder and doing well. Also has chronic bilateral end-stage osteoarthritis both knees associated with morbid obesity. Long discussion regarding treatment options particularly exercise and weight loss. We'll refer to the bariatric center. No follow-up needed for her shoulder. She's been instructed in range of motion and strengthening exercises  Follow-Up Instructions: Return if symptoms worsen or fail to improve.   Orders:  No orders of the defined types were placed in this encounter.  No orders of the defined types were placed in this encounter.     Procedures: No procedures performed   Clinical Data: No additional findings.   Subjective: Chief Complaint  Patient presents with  . Left Shoulder - Follow-up  . Right Knee - Pain  . Left Knee - Pain  . Shoulder Pain    Has not started Physical Therapy, patient states has not received phone call from facility. Occassional pain just depending on activity. Has some weakness.   . Knee Pain    Bilateral knee pain  Several months status post excision of a large lipoma from her left shoulder and doing well. A little bit of numbness along the incision but otherwise neurologically intact. Was unable to raise her arm over her head and now is able to do that with some difficulty because of the weakness. She is really not having much pain..  We also a long discussion regarding the osteoarthritis of both of her knees. By prior films she has evidence of end-stage arthritic change particular in the medial  compartment where she is bone-on-bone both knees. She has a BMI of over 50  HPI  Review of Systems   Objective: Vital Signs: LMP 10/02/2007 Comment: but had bleeding 18 months ago for short period of time and began again 08/2013  Physical Exam  Ortho Exam left shoulder incision is healed with just very small keloid but no particular pain. Little bit of numbness around the incision. Motor and sensory exam is intact. She is able to place her left arm fully overhead and even touch the hand in the low part of her back. No impingement. Negative empty can.  Very large legs. Full extension of both knees and only 90 of flexion at which point of calf touches her thigh. No instability. He likes to larger determine if she has an effusion. No instability.  Specialty Comments:  No specialty comments available.  Imaging: No results found.   PMFS History: Patient Active Problem List   Diagnosis Date Noted  . Lipoma of left shoulder 12/02/2016  . BMI 50.0-59.9, adult ([place])   . Preventative health care 10/10/2014  . HTN (hypertension) 09/06/2014  . Osteoarthritis 09/06/2014  . Post-menopausal bleeding 09/06/2014  . OSA (obstructive sleep apnea) 09/06/2014  . Anxiety and depression 09/06/2014  . Vitamin D deficiency 09/06/2014  . Chronic back pain    Past Medical History:  Diagnosis Date  . Anxiety   .  Arthritis    osteoarthritis - knees  . Blood transfusion 1990   In [place], [place] with C/S Surgery - 5 units transfused  . Blood transfusion without reported diagnosis 1990   after a c/section  . Chest pain   . Chronic back pain   . Depression   . Fibroid   . Headache(784.0)    otc med prn  . Hypertension   . Low blood potassium   . Plantar fasciitis of left foot   . Sleep apnea    CPAP  . Vitamin D deficiency 09/06/2014    Family History  Problem Relation Age of Onset  . Lung cancer Mother 14       dec  . Cancer Mother   . Hypertension Mother   . Arthritis Mother   . Cancer  Father 38       prostate  . Hypertension Father   . Leukemia Sister   . Hypertension Sister   . Arthritis Sister   . Hyperlipidemia Sister   . Hypertension Brother   . Arthritis Brother   . Multiple sclerosis Sister   . Hypertension Sister   . Hypertension Sister   . Hypertension Brother   . Hypertension Brother   . Breast cancer Paternal Aunt   . Diabetes Maternal Grandmother   . Stroke Maternal Grandmother   . Heart attack Paternal Grandmother     Past Surgical History:  Procedure Laterality Date  . BILATERAL OOPHORECTOMY     Complete removal of left ovary and partial removal of right ovary.  . CESAREAN SECTION  1990   x 1  . COLONOSCOPY WITH PROPOFOL N/A 08/30/2015   Procedure: COLONOSCOPY WITH PROPOFOL;  Surgeon: Mauri Pole, MD;  Location: [facility];  Service: Endoscopy;  Laterality: N/A;  . CYSTOSCOPY N/A 05/23/2015   Procedure: CYSTOSCOPY;  Surgeon: Bobbye Charleston, MD;  Location: [facility];  Service: Gynecology;  Laterality: N/A;  . HERNIA REPAIR    . HYSTEROSCOPY W/D&C N/A 01/11/2015   Procedure: DILATATION AND CURETTAGE /HYSTEROSCOPY;  Surgeon: Jerelyn Charles, MD;  Location: [facility];  Service: Gynecology;  Laterality: N/A;  . KNEE SURGERY     right knee  . LIPOMA EXCISION Left 12/02/2016   Procedure: EXCISION LIPOMA OF LEFT SHOULDER;  Surgeon: Garald Balding, MD;  Location: [facility];  Service: Orthopedics;  Laterality: Left;  . ROBOTIC ASSISTED TOTAL HYSTERECTOMY WITH SALPINGECTOMY Bilateral 05/23/2015   Procedure: ROBOTIC ASSISTED TOTAL HYSTERECTOMY WITH SALPINGECTOMYwith right oophorectomy;  Surgeon: Bobbye Charleston, MD;  Location: [facility];  Service: Gynecology;  Laterality: Bilateral;  . TUBAL LIGATION  1990   Social History   Occupational History  . Not on file  Tobacco Use  . Smoking status: Never Smoker  . Smokeless tobacco: Never Used  Substance and Sexual Activity  . Alcohol use: No    Alcohol/week: 0.0 oz  . Drug use: No  . Sexual activity: Not  Currently    Partners: Male    Birth control/protection: Surgical    Comment: Tubal     Garald Balding, MD   Note - This record has been created using [employer].  Chart creation errors have been sought, but may not always  have been located. Such creation errors do not reflect on  the standard of medical care.

## 2017-03-01 ENCOUNTER — Other Ambulatory Visit: Payer: Self-pay | Admitting: Family

## 2017-05-25 ENCOUNTER — Ambulatory Visit: Payer: Self-pay | Admitting: Family

## 2017-05-25 ENCOUNTER — Telehealth: Payer: Self-pay | Admitting: *Deleted

## 2017-05-25 ENCOUNTER — Telehealth: Payer: Self-pay | Admitting: Family

## 2017-05-25 MED ORDER — VENLAFAXINE HCL ER 75 MG PO CP24: 75.0000 mg | ORAL_CAPSULE | Freq: Every day | ORAL | 5 refills | Status: DC

## 2017-05-25 NOTE — Telephone Encounter (Signed)
Received note from pharmacy that effexor 37.5mg  on back order. I sent rx for effexor 75mg  one tab once daily.

## 2017-05-25 NOTE — Telephone Encounter (Signed)
Notified pt and she voices understanding. 

## 2017-05-25 NOTE — Telephone Encounter (Signed)
Pt was scheduled to see PCP at 9:40am today but did not arrive until 10:03am. Next patients were already here and had no cancellations for the morning. Pt said she did not want to see anyone other than PCP and she is unable to come back this afternoon for the opening we did have. Pt will return tomorrow at 11:40am. Pt left [employer] parking placard application and it is in blue folder on my desk to complete at appointment tomorrow.

## 2017-05-26 ENCOUNTER — Ambulatory Visit: Payer: BLUE CROSS/BLUE SHIELD | Admitting: Family

## 2017-05-26 ENCOUNTER — Encounter: Payer: Self-pay | Admitting: Family

## 2017-05-26 DIAGNOSIS — Z0289 Encounter for other administrative examinations: Secondary | ICD-10-CM

## 2017-06-02 ENCOUNTER — Encounter: Payer: Self-pay | Admitting: Family

## 2017-06-12 ENCOUNTER — Encounter (HOSPITAL_COMMUNITY): Payer: Self-pay | Admitting: Emergency Medicine

## 2017-06-12 ENCOUNTER — Ambulatory Visit (HOSPITAL_COMMUNITY)
Admission: EM | Admit: 2017-06-12 | Discharge: 2017-06-12 | Disposition: A | Discharge status: Home / Self Care | Payer: BLUE CROSS/BLUE SHIELD | Attending: Emergency Medicine | Admitting: Emergency Medicine

## 2017-06-12 DIAGNOSIS — J029 Acute pharyngitis, unspecified: Secondary | ICD-10-CM

## 2017-06-12 DIAGNOSIS — R519 Headache, unspecified: Secondary | ICD-10-CM

## 2017-06-12 DIAGNOSIS — J020 Streptococcal pharyngitis: Secondary | ICD-10-CM

## 2017-06-12 DIAGNOSIS — R51 Headache: Secondary | ICD-10-CM | POA: Diagnosis not present

## 2017-06-12 LAB — POCT RAPID STREP A: STREPTOCOCCUS, GROUP A SCREEN (DIRECT): POSITIVE — AB

## 2017-06-12 MED ORDER — DEXAMETHASONE SODIUM PHOSPHATE 10 MG/ML IJ SOLN
10.0000 mg | Freq: Once | INTRAMUSCULAR | Status: AC
  Administered 2017-06-12: 10 mg via INTRAMUSCULAR

## 2017-06-12 MED ORDER — METOCLOPRAMIDE HCL 5 MG/ML IJ SOLN
5.0000 mg | Freq: Once | INTRAMUSCULAR | Status: AC
  Administered 2017-06-12: 5 mg via INTRAMUSCULAR

## 2017-06-12 MED ORDER — DEXAMETHASONE SODIUM PHOSPHATE 10 MG/ML IJ SOLN
INTRAMUSCULAR | Status: AC
  Filled 2017-06-12: qty 1

## 2017-06-12 MED ORDER — CEFTRIAXONE SODIUM 1 G IJ SOLR
INTRAMUSCULAR | Status: AC
  Filled 2017-06-12: qty 10

## 2017-06-12 MED ORDER — AZITHROMYCIN 250 MG PO TABS: 250.0000 mg | ORAL_TABLET | Freq: Every day | ORAL | 0 refills | Status: AC

## 2017-06-12 MED ORDER — KETOROLAC TROMETHAMINE 60 MG/2ML IM SOLN
INTRAMUSCULAR | Status: AC
  Filled 2017-06-12: qty 2

## 2017-06-12 MED ORDER — KETOROLAC TROMETHAMINE 60 MG/2ML IM SOLN
60.0000 mg | Freq: Once | INTRAMUSCULAR | Status: AC
  Administered 2017-06-12: 60 mg via INTRAMUSCULAR

## 2017-06-12 MED ORDER — METOCLOPRAMIDE HCL 5 MG/ML IJ SOLN
INTRAMUSCULAR | Status: AC
  Filled 2017-06-12: qty 2

## 2017-06-12 MED ORDER — CEFTRIAXONE SODIUM 1 G IJ SOLR
1.0000 g | Freq: Once | INTRAMUSCULAR | Status: AC
  Administered 2017-06-12: 1 g via INTRAMUSCULAR

## 2017-06-12 NOTE — Discharge Instructions (Addendum)
Sore throat/headache: For your headache today we gave you a shot of Toradol, Decadron and Reglan.  You tested positive for strep, they gave you a shot of Rocephin, and please begin start taking azithromycin, 2 tablets today, 1 tablet for the following 4 days.  Please go to emergency room if you have worsening swelling, difficulty breathing, shortness of breath or drooling. Blood Pressure: Your blood pressure was elevated today in clinic. Please be sure to take blood pressure medications as prescribed. Please monitor your blood pressure at home or when you go to a [facility]. Please follow up with your primary care doctor to recheck blood pressure and discuss any need for medication changes.   Please go to Emergency Room if you start to experience severe headache, vision changes, decreased urine production, chest pain, shortness of breath, speech slurring, one sided weakness.

## 2017-06-12 NOTE — ED Triage Notes (Signed)
Pt c/o facial pain for a few weeks, also c/o painful throat, hurts when she swallows. Denies URI symptoms. Tonsils are very swollen.

## 2017-06-12 NOTE — ED Provider Notes (Signed)
[facility]    CSN: [id number] Arrival date & time: 06/12/17  1117     History   Chief Complaint Chief Complaint  Patient presents with  . Headache  . Sore Throat    HPI Hailey Vance is a 52 y.o. female presenting today with concern for headache as well as sore throat.  Notes that she has had a headache for the past couple of weeks off and on.  She has been taking Excedrin and Tylenol cold and sinus without relief.  Denies worse headache of life.  Denies vision changes.  Denies photophobia or phonophobia.  Denies any nausea or vomiting.  Denies any one-sided weakness.  States that she has been having more trouble with her headaches recently.  Patient also has high blood pressure, has taken her blood pressure medicines today.  Sore throat began yesterday.  Difficulty swallowing and change in voice.  Has had minimal cough, does endorse some congestion and postnasal drainage.  Denies fevers.  Denies neck stiffness or difficulty breathing. HPI  Past Medical History:  Diagnosis Date  . Anxiety   . Arthritis    osteoarthritis - knees  . Blood transfusion 1990   In [place], [place] with C/S Surgery - 5 units transfused  . Blood transfusion without reported diagnosis 1990   after a c/section  . Chest pain   . Chronic back pain   . Depression   . Fibroid   . Headache(784.0)    otc med prn  . Hypertension   . Low blood potassium   . Plantar fasciitis of left foot   . Sleep apnea    CPAP  . Vitamin D deficiency 09/06/2014    Patient Active Problem List   Diagnosis Date Noted  . Lipoma of left shoulder 12/02/2016  . BMI 50.0-59.9, adult ([place])   . Preventative health care 10/10/2014  . HTN (hypertension) 09/06/2014  . Osteoarthritis 09/06/2014  . Post-menopausal bleeding 09/06/2014  . OSA (obstructive sleep apnea) 09/06/2014  . Anxiety and depression 09/06/2014  . Vitamin D deficiency 09/06/2014  . Chronic back pain     Past Surgical History:  Procedure Laterality  Date  . BILATERAL OOPHORECTOMY     Complete removal of left ovary and partial removal of right ovary.  . CESAREAN SECTION  1990   x 1  . COLONOSCOPY WITH PROPOFOL N/A 08/30/2015   Procedure: COLONOSCOPY WITH PROPOFOL;  Surgeon: Mauri Pole, MD;  Location: [facility];  Service: Endoscopy;  Laterality: N/A;  . CYSTOSCOPY N/A 05/23/2015   Procedure: CYSTOSCOPY;  Surgeon: Bobbye Charleston, MD;  Location: [facility];  Service: Gynecology;  Laterality: N/A;  . HERNIA REPAIR    . HYSTEROSCOPY W/D&C N/A 01/11/2015   Procedure: DILATATION AND CURETTAGE /HYSTEROSCOPY;  Surgeon: Jerelyn Charles, MD;  Location: [facility];  Service: Gynecology;  Laterality: N/A;  . KNEE SURGERY     right knee  . LIPOMA EXCISION Left 12/02/2016   Procedure: EXCISION LIPOMA OF LEFT SHOULDER;  Surgeon: Garald Balding, MD;  Location: [facility];  Service: Orthopedics;  Laterality: Left;  . ROBOTIC ASSISTED TOTAL HYSTERECTOMY WITH SALPINGECTOMY Bilateral 05/23/2015   Procedure: ROBOTIC ASSISTED TOTAL HYSTERECTOMY WITH SALPINGECTOMYwith right oophorectomy;  Surgeon: Bobbye Charleston, MD;  Location: [facility];  Service: Gynecology;  Laterality: Bilateral;  . TUBAL LIGATION  1990    OB History    Gravida  3   Para  3   Term  3   Preterm      AB  Living  3     SAB      TAB      Ectopic      Multiple      Live Births               Home Medications    Prior to Admission medications   Medication Sig Start Date End Date Taking? Authorizing Provider  lisinopril-hydrochlorothiazide (PRINZIDE,ZESTORETIC) 20-25 MG tablet TAKE 1/2 (ONE-HALF) TABLET BY MOUTH ONCE DAILY 03/02/17  Yes Debbrah Alar, NP  meloxicam (MOBIC) 7.5 MG tablet TAKE 1 TABLET BY MOUTH ONCE DAILY 03/02/17  Yes Debbrah Alar, NP  venlafaxine XR (EFFEXOR XR) 75 MG 24 hr capsule Take 1 capsule (75 mg total) by mouth daily with breakfast. 05/25/17  Yes Debbrah Alar, NP  aspirin-acetaminophen-caffeine (EXCEDRIN MIGRAINE)  [id number] MG per tablet Take 2 tablets by mouth every 6 (six) hours as needed for headache or migraine.    [provider]  azithromycin (ZITHROMAX) 250 MG tablet Take 1 tablet (250 mg total) by mouth daily for 5 days. Take first 2 tablets together, then 1 every day until finished. 06/12/17 06/17/17  Daizy Outen C, PA-C  Cholecalciferol (VITAMIN D3) 3000 units TABS Take 1 tablet by mouth daily. 08/03/15   Debbrah Alar, NP  erythromycin ophthalmic ointment Place a 1/2 inch ribbon of ointment into the lower eyelid 4 times daily for 5 days. 09/20/16   Nona Dell, PA-C  FLUoxetine (PROZAC) 20 MG capsule TAKE ONE CAPSULE BY MOUTH ONCE DAILY Patient taking differently: TAKE 20 mg  BY MOUTH ONCE DAILY 09/19/16   Debbrah Alar, NP  ibuprofen (ADVIL,MOTRIN) 600 MG tablet Take 1 tablet (600 mg total) by mouth every 6 (six) hours as needed. 10/12/16   Joy, Shawn C, PA-C  Menthol, Topical Analgesic, (BENGAY EX) Apply 1 application topically daily as needed (knee pain).    [provider]  moxifloxacin (VIGAMOX) 0.5 % ophthalmic solution Place 1 drop into both eyes 3 (three) times daily. 09/23/16   Carollee Herter, Kendrick Fries R, DO  NYSTATIN powder APPLY  POWDER TOPICALLY TO AFFECTED AREA TWICE DAILY AS NEEDED 07/23/16   Debbrah Alar, NP  LISINOPRIL PO Take by mouth.  05/09/11  [provider]    Family History Family History  Problem Relation Age of Onset  . Lung cancer Mother 16       dec  . Cancer Mother   . Hypertension Mother   . Arthritis Mother   . Cancer Father 74       prostate  . Hypertension Father   . Leukemia Sister   . Hypertension Sister   . Arthritis Sister   . Hyperlipidemia Sister   . Hypertension Brother   . Arthritis Brother   . Multiple sclerosis Sister   . Hypertension Sister   . Hypertension Sister   . Hypertension Brother   . Hypertension Brother   . Breast cancer Paternal Aunt   . Diabetes Maternal Grandmother   . Stroke  Maternal Grandmother   . Heart attack Paternal Grandmother     Social History Social History   Tobacco Use  . Smoking status: Never Smoker  . Smokeless tobacco: Never Used  Substance Use Topics  . Alcohol use: No    Alcohol/week: 0.0 oz  . Drug use: No     Allergies   Penicillins   Review of Systems Review of Systems  Constitutional: Negative for chills, fatigue and fever.  HENT: Positive for congestion, sore throat, trouble swallowing and voice  change. Negative for ear pain, rhinorrhea and sinus pressure.   Respiratory: Negative for cough, chest tightness and shortness of breath.   Cardiovascular: Negative for chest pain.  Gastrointestinal: Negative for abdominal pain, nausea and vomiting.  Musculoskeletal: Negative for myalgias.  Skin: Negative for rash.  Neurological: Positive for headaches. Negative for dizziness, syncope, weakness, light-headedness and numbness.     Physical Exam Triage Vital Signs ED Triage Vitals  Enc Vitals Group     BP 06/12/17 1235 (!) 213/116     Pulse Rate 06/12/17 1235 (!) 102     Resp 06/12/17 1235 (!) 26     Temp 06/12/17 1235 99.4 F (37.4 C)     Temp Source 06/12/17 1235 Oral     SpO2 06/12/17 1235 98 %     Weight --      Height --      Head Circumference --      Peak Flow --      Pain Score 06/12/17 1241 10     Pain Loc --      Pain Edu? --      Excl. in [place]? --    No data found.  Updated Vital Signs BP (!) 182/101   Pulse (!) 102 Comment: Notified Traci  Temp 99.4 F (37.4 C) (Oral)   Resp (!) 26 Comment: Notified Traci  LMP 10/02/2007 Comment: but had bleeding 18 months ago for short period of time and began again 08/2013  SpO2 98%   Visual Acuity Right Eye Distance:   Left Eye Distance:   Bilateral Distance:    Right Eye Near:   Left Eye Near:    Bilateral Near:     Physical Exam  Constitutional: She is oriented to person, place, and time. She appears well-developed and well-nourished. No distress.    Muffled voice  HENT:  Head: Normocephalic and atraumatic.  Bilateral TMs nonerythematous, nasal mucosa erythematous with clear rhinorrhea,   Bilateral tonsils significantly enlarged, almost touching each other, no uvula swelling or deviation.  Difficult to visualize posterior pharynx, no masses palpated on neck, no cervical lymphadenopathy.  Eyes: Conjunctivae are normal.  Neck: Neck supple.  Cardiovascular: Normal rate and regular rhythm.  No murmur heard. Pulmonary/Chest: Effort normal and breath sounds normal. No respiratory distress.  Breathing comfortably at rest, CTA BL  Abdominal: Soft. There is no tenderness.  Musculoskeletal: She exhibits no edema.  Neurological: She is alert and oriented to person, place, and time.  Cranial nerves II through XII grossly intact, strength 5/5 at shoulders and hips bilaterally, normal coordination  Skin: Skin is warm and dry.  Psychiatric: She has a normal mood and affect.  Nursing note and vitals reviewed.    UC Treatments / Results  Labs (all labs ordered are listed, but only abnormal results are displayed) Labs Reviewed  POCT RAPID STREP A - Abnormal; Notable for the following components:      Result Value   Streptococcus, Group A Screen (Direct) POSITIVE (*)    All other components within normal limits    EKG None Radiology No results found.  Procedures Procedures (including critical care time)  Medications Ordered in UC Medications  cefTRIAXone (ROCEPHIN) injection 1 g (has no administration in time range)  ketorolac (TORADOL) injection 60 mg (60 mg Intramuscular Given 06/12/17 1310)  metoCLOPramide (REGLAN) injection 5 mg (5 mg Intramuscular Given 06/12/17 1310)  dexamethasone (DECADRON) injection 10 mg (10 mg Intramuscular Given 06/12/17 1309)     Initial Impression / Assessment and Plan /  UC Course  I have reviewed the triage vital signs and the nursing notes.  Pertinent labs & imaging results that were available  during my care of the patient were reviewed by me and considered in my medical decision making (see chart for details).     Strep test positive, given significant swelling of tonsils will provide shot of Rocephin in clinic today and was sent home with azithromycin.  Patient has itching with penicillins.  Patient appears to be having persistent headache, no focal neuro deficits.  Provided Toradol, Reglan and Decadron.  Advised patient if she has any swelling worsening of her throat or difficulty breathing or shortness of breath to go to emergency room immediately. Discussed strict return precautions. Patient verbalized understanding and is agreeable with plan.   Final Clinical Impressions(s) / UC Diagnoses   Final diagnoses:  Strep pharyngitis  Bad headache    ED Discharge Orders        Ordered    azithromycin (ZITHROMAX) 250 MG tablet  Daily     06/12/17 1322       Controlled Substance Prescriptions [place] Controlled Substance Registry consulted? Not Applicable   Janith Lima, [place] 06/12/17 1333

## 2017-08-18 ENCOUNTER — Ambulatory Visit (HOSPITAL_COMMUNITY)
Admission: EM | Admit: 2017-08-18 | Discharge: 2017-08-18 | Disposition: A | Discharge status: Home / Self Care | Payer: BLUE CROSS/BLUE SHIELD | Attending: Internal Medicine | Admitting: Internal Medicine

## 2017-08-18 ENCOUNTER — Encounter (HOSPITAL_COMMUNITY): Payer: Self-pay | Admitting: Emergency Medicine

## 2017-08-18 ENCOUNTER — Other Ambulatory Visit: Payer: Self-pay

## 2017-08-18 DIAGNOSIS — J029 Acute pharyngitis, unspecified: Secondary | ICD-10-CM

## 2017-08-18 DIAGNOSIS — J020 Streptococcal pharyngitis: Secondary | ICD-10-CM

## 2017-08-18 LAB — POCT RAPID STREP A: STREPTOCOCCUS, GROUP A SCREEN (DIRECT): POSITIVE — AB

## 2017-08-18 MED ORDER — METHYLPREDNISOLONE SODIUM SUCC 125 MG IJ SOLR
125.0000 mg | Freq: Once | INTRAMUSCULAR | Status: AC
  Administered 2017-08-18: 125 mg via INTRAMUSCULAR

## 2017-08-18 MED ORDER — METHYLPREDNISOLONE SODIUM SUCC 125 MG IJ SOLR
INTRAMUSCULAR | Status: AC
  Filled 2017-08-18: qty 2

## 2017-08-18 MED ORDER — PREDNISONE 50 MG PO TABS: 50.0000 mg | ORAL_TABLET | Freq: Every day | ORAL | 0 refills | Status: AC

## 2017-08-18 MED ORDER — AZITHROMYCIN 250 MG PO TABS: 250.0000 mg | ORAL_TABLET | Freq: Every day | ORAL | 0 refills | Status: DC

## 2017-08-18 MED ORDER — CEFTRIAXONE SODIUM 1 G IJ SOLR
INTRAMUSCULAR | Status: AC
  Filled 2017-08-18: qty 10

## 2017-08-18 MED ORDER — CEFTRIAXONE SODIUM 1 G IJ SOLR
1.0000 g | Freq: Once | INTRAMUSCULAR | Status: AC
  Administered 2017-08-18: 1 g via INTRAMUSCULAR

## 2017-08-18 MED ORDER — LIDOCAINE HCL (PF) 1 % IJ SOLN
INTRAMUSCULAR | Status: AC
  Filled 2017-08-18: qty 30

## 2017-08-18 NOTE — Discharge Instructions (Signed)
Strep test was positive, we gave you a shot of Rocephin and Solu-Medrol today  Please begin azithromycin-please take twice today, once for the following 4 days, prednisone daily for 3 days

## 2017-08-18 NOTE — ED Triage Notes (Signed)
Sore throat started 2 days ago.  Left side of neck is tender to the touch.  Patient has chills

## 2017-08-18 NOTE — ED Provider Notes (Signed)
[place]    CSN: [id number] Arrival date & time: 08/18/17  1426     History   Chief Complaint Chief Complaint  Patient presents with  . Sore Throat    HPI Hailey Vance is a 52 y.o. female history of hypertension presenting today for evaluation of sore throat.  Symptoms began 2 days ago.  Noting fevers and chills.  She has not taken anything for her symptoms.  Noting change in voice.  Endorsing mild congestion and cough.  States she had strep throat approximately 2 months ago.  Has been passed around in her house.   HPI  Past Medical History:  Diagnosis Date  . Anxiety   . Arthritis    osteoarthritis - knees  . Blood transfusion 1990   In [place], [place] with C/S Surgery - 5 units transfused  . Blood transfusion without reported diagnosis 1990   after a c/section  . Chest pain   . Chronic back pain   . Depression   . Fibroid   . Headache(784.0)    otc med prn  . Hypertension   . Low blood potassium   . Plantar fasciitis of left foot   . Sleep apnea    CPAP  . Vitamin D deficiency 09/06/2014    Patient Active Problem List   Diagnosis Date Noted  . Lipoma of left shoulder 12/02/2016  . BMI 50.0-59.9, adult ([place])   . Preventative health care 10/10/2014  . HTN (hypertension) 09/06/2014  . Osteoarthritis 09/06/2014  . Post-menopausal bleeding 09/06/2014  . OSA (obstructive sleep apnea) 09/06/2014  . Anxiety and depression 09/06/2014  . Vitamin D deficiency 09/06/2014  . Chronic back pain     Past Surgical History:  Procedure Laterality Date  . BILATERAL OOPHORECTOMY     Complete removal of left ovary and partial removal of right ovary.  . CESAREAN SECTION  1990   x 1  . COLONOSCOPY WITH PROPOFOL N/A 08/30/2015   Procedure: COLONOSCOPY WITH PROPOFOL;  Surgeon: Mauri Pole, MD;  Location: [facility];  Service: Endoscopy;  Laterality: N/A;  . CYSTOSCOPY N/A 05/23/2015   Procedure: CYSTOSCOPY;  Surgeon: Bobbye Charleston, MD;  Location: [facility];   Service: Gynecology;  Laterality: N/A;  . HERNIA REPAIR    . HYSTEROSCOPY W/D&C N/A 01/11/2015   Procedure: DILATATION AND CURETTAGE /HYSTEROSCOPY;  Surgeon: Jerelyn Charles, MD;  Location: [facility];  Service: Gynecology;  Laterality: N/A;  . KNEE SURGERY     right knee  . LIPOMA EXCISION Left 12/02/2016   Procedure: EXCISION LIPOMA OF LEFT SHOULDER;  Surgeon: Garald Balding, MD;  Location: [facility];  Service: Orthopedics;  Laterality: Left;  . ROBOTIC ASSISTED TOTAL HYSTERECTOMY WITH SALPINGECTOMY Bilateral 05/23/2015   Procedure: ROBOTIC ASSISTED TOTAL HYSTERECTOMY WITH SALPINGECTOMYwith right oophorectomy;  Surgeon: Bobbye Charleston, MD;  Location: [facility];  Service: Gynecology;  Laterality: Bilateral;  . TUBAL LIGATION  1990    OB History    Gravida  3   Para  3   Term  3   Preterm      AB      Living  3     SAB      TAB      Ectopic      Multiple      Live Births               Home Medications    Prior to Admission medications   Medication Sig Start Date End Date Taking? Authorizing Provider  aspirin-acetaminophen-caffeine (EXCEDRIN MIGRAINE) 250-250-65 MG per tablet Take 2 tablets by mouth every 6 (six) hours as needed for headache or migraine.    [provider]  azithromycin (ZITHROMAX) 250 MG tablet Take 1 tablet (250 mg total) by mouth daily. Take first 2 tablets together, then 1 every day until finished. 08/18/17   Wieters, Hallie C, PA-C  erythromycin ophthalmic ointment Place a 1/2 inch ribbon of ointment into the lower eyelid 4 times daily for 5 days. 09/20/16   Nona Dell, PA-C  FLUoxetine (PROZAC) 20 MG capsule TAKE ONE CAPSULE BY MOUTH ONCE DAILY Patient taking differently: TAKE 20 mg  BY MOUTH ONCE DAILY 09/19/16   Debbrah Alar, NP  ibuprofen (ADVIL,MOTRIN) 600 MG tablet Take 1 tablet (600 mg total) by mouth every 6 (six) hours as needed. 10/12/16   Joy, Shawn C, PA-C  lisinopril-hydrochlorothiazide (PRINZIDE,ZESTORETIC) 20-25  MG tablet TAKE 1/2 (ONE-HALF) TABLET BY MOUTH ONCE DAILY 03/02/17   Debbrah Alar, NP  meloxicam (MOBIC) 7.5 MG tablet TAKE 1 TABLET BY MOUTH ONCE DAILY 03/02/17   Debbrah Alar, NP  Menthol, Topical Analgesic, (BENGAY EX) Apply 1 application topically daily as needed (knee pain).    [provider]  NYSTATIN powder APPLY  POWDER TOPICALLY TO AFFECTED AREA TWICE DAILY AS NEEDED 07/23/16   Debbrah Alar, NP  predniSONE (DELTASONE) 50 MG tablet Take 1 tablet (50 mg total) by mouth daily for 5 days. 08/18/17 08/23/17  Wieters, Hallie C, PA-C  venlafaxine XR (EFFEXOR XR) 75 MG 24 hr capsule Take 1 capsule (75 mg total) by mouth daily with breakfast. 05/25/17   Debbrah Alar, NP  LISINOPRIL PO Take by mouth.  05/09/11  [provider]    Family History Family History  Problem Relation Age of Onset  . Lung cancer Mother 53       dec  . Cancer Mother   . Hypertension Mother   . Arthritis Mother   . Cancer Father 54       prostate  . Hypertension Father   . Leukemia Sister   . Hypertension Sister   . Arthritis Sister   . Hyperlipidemia Sister   . Hypertension Brother   . Arthritis Brother   . Multiple sclerosis Sister   . Hypertension Sister   . Hypertension Sister   . Hypertension Brother   . Hypertension Brother   . Breast cancer Paternal Aunt   . Diabetes Maternal Grandmother   . Stroke Maternal Grandmother   . Heart attack Paternal Grandmother     Social History Social History   Tobacco Use  . Smoking status: Never Smoker  . Smokeless tobacco: Never Used  Substance Use Topics  . Alcohol use: No    Alcohol/week: 0.0 oz  . Drug use: No     Allergies   Penicillins   Review of Systems Review of Systems  Constitutional: Positive for appetite change, chills and fever. Negative for activity change and fatigue.  HENT: Positive for congestion and sore throat. Negative for ear pain, rhinorrhea, sinus pressure and trouble swallowing.     Respiratory: Positive for cough. Negative for chest tightness and shortness of breath.   Cardiovascular: Negative for chest pain.  Gastrointestinal: Negative for abdominal pain, nausea and vomiting.  Musculoskeletal: Negative for myalgias.  Skin: Negative for rash.  Neurological: Negative for dizziness, light-headedness and headaches.     Physical Exam Triage Vital Signs ED Triage Vitals  Enc Vitals Group     BP 08/18/17 1449 (!) 152/106  Pulse Rate 08/18/17 1449 88     Resp 08/18/17 1449 (!) 24     Temp 08/18/17 1449 99.8 F (37.7 C)     Temp Source 08/18/17 1449 Oral     SpO2 08/18/17 1449 96 %     Weight --      Height --      Head Circumference --      Peak Flow --      Pain Score 08/18/17 1446 10     Pain Loc --      Pain Edu? --      Excl. in [place]? --    No data found.  Updated Vital Signs BP (!) 152/106 (BP Location: Left Arm) Comment: regular cuff on forearm  Pulse 88   Temp 99.8 F (37.7 C) (Oral)   Resp (!) 24   LMP 10/02/2007 Comment: but had bleeding 18 months ago for short period of time and began again 08/2013  SpO2 96%   Visual Acuity Right Eye Distance:   Left Eye Distance:   Bilateral Distance:    Right Eye Near:   Left Eye Near:    Bilateral Near:     Physical Exam  Constitutional: She appears well-developed and well-nourished. No distress.  HENT:  Head: Normocephalic and atraumatic.  Bilateral ears without tenderness to palpation of external auricle, tragus and mastoid, EAC's without erythema or swelling, TM's with good bony landmarks and cone of light. Non erythematous.  Nasal mucosa pink, no rhinorrhea present  Oral mucosa pink and moist, significant tonsillar enlargement with erythema bilaterally, left greater than right, no exudate, tonsils are touching each other, posterior oropharynx not visualized..  Voice is muffled.   Eyes: Conjunctivae are normal.  Neck: Neck supple.  Cardiovascular: Normal rate and regular rhythm.  No  murmur heard. Pulmonary/Chest: Effort normal and breath sounds normal. No respiratory distress.  Breathing comfortably at rest, CTABL, no wheezing, rales or other adventitious sounds auscultated  Abdominal: Soft. There is no tenderness.  Musculoskeletal: She exhibits no edema.  Neurological: She is alert.  Skin: Skin is warm and dry.  Psychiatric: She has a normal mood and affect.  Nursing note and vitals reviewed.    UC Treatments / Results  Labs (all labs ordered are listed, but only abnormal results are displayed) Labs Reviewed  POCT RAPID STREP A - Abnormal; Notable for the following components:      Result Value   Streptococcus, Group A Screen (Direct) POSITIVE (*)    All other components within normal limits    EKG None  Radiology No results found.  Procedures Procedures (including critical care time)  Medications Ordered in UC Medications  cefTRIAXone (ROCEPHIN) injection 1 g (1 g Intramuscular Given 08/18/17 1549)  methylPREDNISolone sodium succinate (SOLU-MEDROL) 125 mg/2 mL injection 125 mg (125 mg Intramuscular Given 08/18/17 1544)    Initial Impression / Assessment and Plan / UC Course  I have reviewed the triage vital signs and the nursing notes.  Pertinent labs & imaging results that were available during my care of the patient were reviewed by me and considered in my medical decision making (see chart for details).     Patient given Rocephin and Solu-Medrol given significant swelling of tonsils.  Tested positive for strep.  Has itching with penicillins, will treat with azithromycin as alternative.  This is the second time patient has had strep throat in the past couple of months with significant swelling of tonsils, likely has large tonsils at baseline, may need to  follow-up with ENT if this becomes a recurrent issue.  Tylenol and ibuprofen for fever.  Discussed strict return precautions. Patient verbalized understanding and is agreeable with plan.  Final  Clinical Impressions(s) / UC Diagnoses   Final diagnoses:  Strep pharyngitis     Discharge Instructions     Strep test was positive, we gave you a shot of Rocephin and Solu-Medrol today  Please begin azithromycin-please take twice today, once for the following 4 days, prednisone daily for 3 days   ED Prescriptions    Medication Sig Dispense Auth. Provider   azithromycin (ZITHROMAX) 250 MG tablet Take 1 tablet (250 mg total) by mouth daily. Take first 2 tablets together, then 1 every day until finished. 6 tablet Wieters, Hallie C, PA-C   predniSONE (DELTASONE) 50 MG tablet Take 1 tablet (50 mg total) by mouth daily for 5 days. 3 tablet Wieters, Hallie C, PA-C     Controlled Substance Prescriptions [place] Controlled Substance Registry consulted? Not Applicable   Janith Lima, [place] 08/18/17 1558

## 2017-09-14 ENCOUNTER — Other Ambulatory Visit (HOSPITAL_COMMUNITY): Payer: Self-pay | Admitting: General Surgery

## 2017-09-30 ENCOUNTER — Ambulatory Visit (HOSPITAL_COMMUNITY): Admission: RE | Admit: 2017-09-30 | Payer: BLUE CROSS/BLUE SHIELD | Source: Ambulatory Visit

## 2017-09-30 ENCOUNTER — Encounter: Payer: Self-pay | Admitting: Registered"

## 2017-09-30 ENCOUNTER — Encounter: Payer: BLUE CROSS/BLUE SHIELD | Attending: General Surgery | Admitting: Registered"

## 2017-09-30 DIAGNOSIS — G4730 Sleep apnea, unspecified: Secondary | ICD-10-CM | POA: Insufficient documentation

## 2017-09-30 DIAGNOSIS — Z7982 Long term (current) use of aspirin: Secondary | ICD-10-CM | POA: Diagnosis not present

## 2017-09-30 DIAGNOSIS — I10 Essential (primary) hypertension: Secondary | ICD-10-CM | POA: Insufficient documentation

## 2017-09-30 DIAGNOSIS — Z79899 Other long term (current) drug therapy: Secondary | ICD-10-CM | POA: Diagnosis not present

## 2017-09-30 DIAGNOSIS — Z713 Dietary counseling and surveillance: Secondary | ICD-10-CM | POA: Diagnosis not present

## 2017-09-30 DIAGNOSIS — Z90722 Acquired absence of ovaries, bilateral: Secondary | ICD-10-CM | POA: Insufficient documentation

## 2017-09-30 DIAGNOSIS — Z880 Allergy status to penicillin: Secondary | ICD-10-CM | POA: Insufficient documentation

## 2017-09-30 DIAGNOSIS — M1990 Unspecified osteoarthritis, unspecified site: Secondary | ICD-10-CM | POA: Diagnosis not present

## 2017-09-30 DIAGNOSIS — Z8261 Family history of arthritis: Secondary | ICD-10-CM | POA: Insufficient documentation

## 2017-09-30 DIAGNOSIS — Z90710 Acquired absence of both cervix and uterus: Secondary | ICD-10-CM | POA: Diagnosis not present

## 2017-09-30 DIAGNOSIS — F419 Anxiety disorder, unspecified: Secondary | ICD-10-CM | POA: Diagnosis not present

## 2017-09-30 DIAGNOSIS — E669 Obesity, unspecified: Secondary | ICD-10-CM

## 2017-09-30 DIAGNOSIS — F329 Major depressive disorder, single episode, unspecified: Secondary | ICD-10-CM | POA: Diagnosis not present

## 2017-09-30 DIAGNOSIS — Z8249 Family history of ischemic heart disease and other diseases of the circulatory system: Secondary | ICD-10-CM | POA: Insufficient documentation

## 2017-09-30 DIAGNOSIS — Z6841 Body Mass Index (BMI) 40.0 and over, adult: Secondary | ICD-10-CM | POA: Diagnosis not present

## 2017-09-30 NOTE — Progress Notes (Signed)
Pre-Op Assessment Visit:  Pre-Operative Sleeve Gastrectomy Surgery  Medical Nutrition Therapy:  Appt start time: 9:40  End time:  11:00  Patient was seen on 09/30/2017 for Pre-Operative Nutrition Assessment. Assessment. Letter of approval needs to be faxed to [facility] Bariatric Surgery Program coordinator.   Pt expectation of surgery: improve pain in knees, ankles, and back, be more active (enjoys going to the beach and amusements parks), possibly have knee replacement surgery, prolong life  Pt expectation of Dietitian: educate on what to ea/not to eat to be successful  Start weight at [facility]: 346.5 BMI: 61.38   Pt states she does not have a job at the moment. Pt states she knows bariatric surgery is expensive and will make sure she has what she needs because she has 3 children. Pt states they are supportive of her and will do what needs to be done to help. Pt states she is unable to go to a gym because of finances. Pt states she does not have a source of income and in the process of seeing if she qualifies for disability.   Pt states 2 sons and 5 grandchildren live with her. Pt states her oldest son prepares the meals for her. Pt states when he does not cook she orders pizza. Pt states her son is not a vegetable person and is a meat, potatoes, and bread person. Pt states she really does not eat bread unless eating sandwiches. Pt states she stashes drinks in her room to hide from grandchildren.   It is my professional opinion that pt is not ready for bariatric surgery yet.   Per insurance, pt needs 0 SWL visits prior to surgery. It is my recommendation that pt has a few SWL visits to prepare for surgery and assess readiness to ensure follow-up success. Pt will need education on the remainder of pre-op goals, protein shakes, and vitamin and mineral recommendations at next visit.    24 hr Dietary Recall: First Meal: skips Snack: none Second Meal: 2 tacos Snack: sometimes chips,  ice cream, fruit Third Meal: rice, hamburger with gravy, potatoes Snack: sometimes chips, ice cream, fruit Beverages: water, Gatorade, lemonade, juice, coffee sometimes  Encouraged to engage in 75 minutes of moderate physical activity including cardiovascular and weight baring weekly  Handouts given during visit include:  . Pre-Op Goals . Bariatric Surgery Protein Shakes . Vitamin and Mineral Recommendations  During the appointment today the following Pre-Op Goals were reviewed with the patient: . Track your food and beverage: MyFitness Pal or Baritastic App . Make healthy food choices . Begin to limit portion sizes . Limited concentrated sugars and fried foods . Keep fat/sugar in the single digits per serving on             food labels . Practice CHEWING your food  (aim for 30 chews per bite or until applesauce consistency) . Practice not drinking 15 minutes before, during, and 30 minutes after each meal/snack . Avoid all carbonated beverages  . Avoid/limit caffeinated beverages   Next visit: . Avoid all sugar-sweetened beverages . Avoid alcohol . Consume 3 meals per day; eat every 3-5 hours . Make a list of non-food related activities . Aim for 64-100 ounces of FLUID daily  . Aim for at least 60-80 grams of PROTEIN daily . Look for a liquid protein source that contain ?15 g protein and ?5 g carbohydrate  (ex: shakes, drinks, shots) . Physical activity is an important part of a healthy lifestyle so keep  it moving!  Follow diet recommendations listed below Energy and Macronutrient Recommendations: Calories: 1600 Carbohydrate: 180 Protein: 120 Fat: 44  Demonstrated degree of understanding via:  Teach Back   Teaching Method Utilized:  Visual Auditory Hands on  Barriers to learning/adherence to lifestyle change: financial restraints and precontemplative stage of change  Patient to call the [facility] Education [facility] to enroll in Pre-Op and Post-Op  Nutrition Education when surgery date is scheduled.

## 2017-10-02 ENCOUNTER — Telehealth: Payer: Self-pay | Admitting: Family

## 2017-10-05 NOTE — Telephone Encounter (Signed)
[name] -- please advise re: lisinopril - hctz and meloxicam requests. Pt last seen by you 09/10/16.

## 2017-10-06 NOTE — Telephone Encounter (Signed)
2 week supply sent. Needs OV prior to additional refills please.

## 2017-10-07 NOTE — Telephone Encounter (Signed)
Pt has been schedule for 8-16 to discuss wt loss surgery. Pt is aware 2 wk supply of med was sent to [facility]

## 2017-10-07 NOTE — Telephone Encounter (Signed)
Called pt and LVM regarding her Rx refill request. Informed pt that a 2 week supply was sent in, but she would need a follow up visit before any further refills could be sent in. Advised pt to call and schedule an appt at her earliest convenience.

## 2017-10-16 ENCOUNTER — Ambulatory Visit: Payer: BLUE CROSS/BLUE SHIELD | Admitting: Family

## 2017-10-16 ENCOUNTER — Telehealth: Payer: Self-pay | Admitting: *Deleted

## 2017-10-16 DIAGNOSIS — Z0289 Encounter for other administrative examinations: Secondary | ICD-10-CM

## 2017-10-16 NOTE — Telephone Encounter (Signed)
Form was placed in blue folder on CMA desk for pt's follow up that was in March. Pt was not able to keep appt in March and has r/s for 10/27/17. She was requesting placard due to her osteoarthritis in her knees. Form placed in PCP's red folder.  Please advise?

## 2017-10-16 NOTE — Telephone Encounter (Signed)
Copied from [place] [phone]. Topic: Inquiry >> Oct 16, 2017  7:27 AM Scherrie Gerlach wrote: Reason for CRM: pt would like to know if her handicap application is ready for pick up

## 2017-10-19 NOTE — Telephone Encounter (Signed)
Form signed by PCP. Home # not available at this time and cell voicemail is full (unable to leave msg). Sent [employer]. Copy sent for scanning and original placed at front desk for pt to pick up.

## 2017-10-22 ENCOUNTER — Ambulatory Visit (HOSPITAL_COMMUNITY): Payer: BLUE CROSS/BLUE SHIELD

## 2017-10-22 ENCOUNTER — Ambulatory Visit (HOSPITAL_COMMUNITY): Admission: RE | Admit: 2017-10-22 | Payer: BLUE CROSS/BLUE SHIELD | Source: Ambulatory Visit

## 2017-10-27 ENCOUNTER — Encounter: Payer: Self-pay | Admitting: Family

## 2017-10-27 ENCOUNTER — Ambulatory Visit: Payer: BLUE CROSS/BLUE SHIELD | Admitting: Family

## 2017-10-27 ENCOUNTER — Ambulatory Visit: Payer: BLUE CROSS/BLUE SHIELD | Admitting: Registered"

## 2017-10-27 VITALS — BP 150/100 | HR 80 | Temp 98.8°F | Resp 16 | Ht 63.0 in | Wt 355.0 lb

## 2017-10-27 DIAGNOSIS — G4733 Obstructive sleep apnea (adult) (pediatric): Secondary | ICD-10-CM

## 2017-10-27 DIAGNOSIS — I10 Essential (primary) hypertension: Secondary | ICD-10-CM

## 2017-10-27 DIAGNOSIS — R011 Cardiac murmur, unspecified: Secondary | ICD-10-CM | POA: Diagnosis not present

## 2017-10-27 LAB — BASIC METABOLIC PANEL
BUN: 18 mg/dL (ref 6–23)
CHLORIDE: 102 meq/L (ref 96–112)
CO2: 29 meq/L (ref 19–32)
Calcium: 9.7 mg/dL (ref 8.4–10.5)
Creatinine, Ser: 1 mg/dL (ref 0.40–1.20)
GFR: 74.39 mL/min (ref >60.00)
GLUCOSE: 101 mg/dL — AB (ref 70–99)
POTASSIUM: 3.9 meq/L (ref 3.5–5.1)
SODIUM: 139 meq/L (ref 135–145)

## 2017-10-27 MED ORDER — VENLAFAXINE HCL ER 150 MG PO CP24: 150.0000 mg | ORAL_CAPSULE | Freq: Every day | ORAL | 2 refills | Status: DC

## 2017-10-27 MED ORDER — MELOXICAM 7.5 MG PO TABS: 7.5000 mg | ORAL_TABLET | Freq: Every day | ORAL | 1 refills | Status: DC

## 2017-10-27 MED ORDER — LISINOPRIL-HYDROCHLOROTHIAZIDE 20-25 MG PO TABS: ORAL_TABLET | ORAL | 5 refills | Status: DC

## 2017-10-27 NOTE — Patient Instructions (Addendum)
Increase lisinopril/hctz to a full tab once daily. Increase effexor to 150mg  once daily. Complete lab work prior to leaving.

## 2017-10-27 NOTE — Progress Notes (Addendum)
Subjective:    Patient ID: Hailey Vance, female    DOB: 03-15-1965, 52 y.o.   MRN: [id number]  HPI  Patient is a 52 yr old female who presents today to discuss weight loss.   HTN- taking 1/2 tab of lisinopril/hctz.   BP Readings from Last 3 Encounters:  10/27/17 (!) 150/100  08/18/17 (!) 152/106  06/12/17 (!) 182/101   Depression-  Reports that she has not had prozac x 1 month. She continues venlafaxine. Notes that she gets easily frustrated and feels full of emotions.   Knee pain-  Notes that she has chronic low back pain. Was following with neurology who sent her for injections which she did not find helpful except for helping the radicular pain. Was told that her symptoms are mostly arthritis.    Morbid obesity- she was told by ortho that she needs bilateral TKR but weigh tis too high. He recommended bariatric referral.  She attended the seminar.  She is now following with Dr. Hunt Oris who has sent her to the nutritionist and a psychologist. She is considering gastric sleeve.  She reports that she has daytime somnolence, + snoring.   Review of Systems    see HPI  Past Medical History:  Diagnosis Date  . Anxiety   . Arthritis    osteoarthritis - knees  . Blood transfusion 1990   In [place], [place] with C/S Surgery - 5 units transfused  . Blood transfusion without reported diagnosis 1990   after a c/section  . Chest pain   . Chronic back pain   . Depression   . Fibroid   . Headache(784.0)    otc med prn  . Hypertension   . Low blood potassium   . Plantar fasciitis of left foot   . Sleep apnea    CPAP  . Vitamin D deficiency 09/06/2014     Social History   Socioeconomic History  . Marital status: Divorced    Spouse name: Not on file  . Number of children: Not on file  . Years of education: Not on file  . Highest education level: Not on file  Occupational History  . Not on file  Social Needs  . Financial resource strain: Not on file  . Food insecurity:   Worry: Not on file    Inability: Not on file  . Transportation needs:    Medical: Not on file    Non-medical: Not on file  Tobacco Use  . Smoking status: Never Smoker  . Smokeless tobacco: Never Used  Substance and Sexual Activity  . Alcohol use: No    Alcohol/week: 0.0 standard drinks  . Drug use: No  . Sexual activity: Not Currently    Partners: Male    Birth control/protection: Surgical    Comment: Tubal  Lifestyle  . Physical activity:    Days per week: Not on file    Minutes per session: Not on file  . Stress: Not on file  Relationships  . Social connections:    Talks on phone: Not on file    Gets together: Not on file    Attends religious service: Not on file    Active member of club or organization: Not on file    Attends meetings of clubs or organizations: Not on file    Relationship status: Not on file  . Intimate partner violence:    Fear of current or ex partner: Not on file    Emotionally abused: Not on file  Physically abused: Not on file    Forced sexual activity: Not on file  Other Topics Concern  . Not on file  Social History Narrative   Divorced   Lives with son and grand-daughter   [place] (lives in [place])   [place] lives with patient   61- Daughter [name] (lives in [place])   Works at [employer]   Completed some college   Careers adviser    Past Surgical History:  Procedure Laterality Date  . BILATERAL OOPHORECTOMY     Complete removal of left ovary and partial removal of right ovary.  . CESAREAN SECTION  1990   x 1  . COLONOSCOPY WITH PROPOFOL N/A 08/30/2015   Procedure: COLONOSCOPY WITH PROPOFOL;  Surgeon: Mauri Pole, MD;  Location: [facility];  Service: Endoscopy;  Laterality: N/A;  . CYSTOSCOPY N/A 05/23/2015   Procedure: CYSTOSCOPY;  Surgeon: Bobbye Charleston, MD;  Location: [facility];  Service: Gynecology;  Laterality: N/A;  . HERNIA REPAIR    . HYSTEROSCOPY W/D&C N/A 01/11/2015     Procedure: DILATATION AND CURETTAGE /HYSTEROSCOPY;  Surgeon: Jerelyn Charles, MD;  Location: [facility];  Service: Gynecology;  Laterality: N/A;  . KNEE SURGERY     right knee  . LIPOMA EXCISION Left 12/02/2016   Procedure: EXCISION LIPOMA OF LEFT SHOULDER;  Surgeon: Garald Balding, MD;  Location: [facility];  Service: Orthopedics;  Laterality: Left;  . ROBOTIC ASSISTED TOTAL HYSTERECTOMY WITH SALPINGECTOMY Bilateral 05/23/2015   Procedure: ROBOTIC ASSISTED TOTAL HYSTERECTOMY WITH SALPINGECTOMYwith right oophorectomy;  Surgeon: Bobbye Charleston, MD;  Location: [facility];  Service: Gynecology;  Laterality: Bilateral;  . TUBAL LIGATION  1990    Family History  Problem Relation Age of Onset  . Lung cancer Mother 93       dec  . Cancer Mother   . Hypertension Mother   . Arthritis Mother   . Cancer Father 67       prostate  . Hypertension Father   . Leukemia Sister   . Hypertension Sister   . Arthritis Sister   . Hyperlipidemia Sister   . Hypertension Brother   . Arthritis Brother   . Multiple sclerosis Sister   . Hypertension Sister   . Hypertension Sister   . Hypertension Brother   . Hypertension Brother   . Breast cancer Paternal Aunt   . Diabetes Maternal Grandmother   . Stroke Maternal Grandmother   . Heart attack Paternal Grandmother   . Asthma Other     Allergies  Allergen Reactions  . Penicillins Itching    Has patient had a PCN reaction causing immediate rash, facial/tongue/throat swelling, SOB or lightheadedness with hypotension: No Has patient had a PCN reaction causing severe rash involving mucus membranes or skin necrosis: No Has patient had a PCN reaction that required hospitalization No Has patient had a PCN reaction occurring within the last 10 years: No If all of the above answers are "NO", then may proceed with Cephalosporin use.     Current Outpatient Medications on File Prior to Visit  Medication Sig Dispense Refill  . aspirin-acetaminophen-caffeine (EXCEDRIN  MIGRAINE) 250-250-65 MG per tablet Take 2 tablets by mouth every 6 (six) hours as needed for headache or migraine.    Marland Kitchen azithromycin (ZITHROMAX) 250 MG tablet Take 1 tablet (250 mg total) by mouth daily. Take first 2 tablets together, then 1 every day until finished. (Patient not taking: Reported on 09/30/2017) 6 tablet 0  . erythromycin ophthalmic  ointment Place a 1/2 inch ribbon of ointment into the lower eyelid 4 times daily for 5 days. (Patient not taking: Reported on 09/30/2017) 1 g 0  . FLUoxetine (PROZAC) 20 MG capsule TAKE ONE CAPSULE BY MOUTH ONCE DAILY (Patient taking differently: TAKE 20 mg  BY MOUTH ONCE DAILY) 30 capsule 5  . ibuprofen (ADVIL,MOTRIN) 600 MG tablet Take 1 tablet (600 mg total) by mouth every 6 (six) hours as needed. (Patient not taking: Reported on 09/30/2017) 30 tablet 0  . lisinopril-hydrochlorothiazide (PRINZIDE,ZESTORETIC) 20-25 MG tablet TAKE 1/2 (ONE-HALF) TABLET BY MOUTH ONCE DAILY 7 tablet 0  . meloxicam (MOBIC) 7.5 MG tablet TAKE 1 TABLET BY MOUTH ONCE DAILY 14 tablet 0  . Menthol, Topical Analgesic, (BENGAY EX) Apply 1 application topically daily as needed (knee pain).    . NYSTATIN powder APPLY  POWDER TOPICALLY TO AFFECTED AREA TWICE DAILY AS NEEDED 45 g 1  . venlafaxine XR (EFFEXOR XR) 75 MG 24 hr capsule Take 1 capsule (75 mg total) by mouth daily with breakfast. 30 capsule 5  . [DISCONTINUED] LISINOPRIL PO Take by mouth.     No current facility-administered medications on file prior to visit.     BP (!) 150/100 (BP Location: Right Arm, Patient Position: Sitting, Cuff Size: Large)   Pulse 80   Temp 98.8 F (37.1 C) (Oral)   Resp 16   Ht 5\' 3"  (1.6 m)   Wt (!) 355 lb (161 kg)   LMP 10/02/2007 Comment: but had bleeding 18 months ago for short period of time and began again 08/2013  SpO2 98%   BMI 62.89 kg/m    Objective:   Physical Exam  Constitutional: She is oriented to person, place, and time. She appears well-developed and well-nourished.    Cardiovascular: Normal rate and regular rhythm.  Murmur heard. Pulmonary/Chest: Effort normal and breath sounds normal. No respiratory distress. She has no wheezes.  Musculoskeletal: She exhibits edema.  Neurological: She is alert and oriented to person, place, and time.  Skin: Skin is warm and dry.  Psychiatric: She has a normal mood and affect. Her behavior is normal. Judgment and thought content normal.          Assessment & Plan:  Hypertension-uncontrolled.  Will increase lisinopril hydrochlorothiazide dosing to full tablet once daily.  Depression- she has been out of Prozac for 1 month.  Still having some depression symptoms.  Will keep patient off of Prozac but plan to increase Effexor dose.  Morbid obesity-this is likely contributing to her knee pain.  She is working with bariatrics with a plan to head towards gastric sleeve.  Murmur- this is new.  Will obtain 2D echo to further evaluate.  Obstructive sleep apnea- patient was noted to have severe obstructive sleep apnea on 2017 split-night study.  She is not currently on CPAP.  Will order CPAP and plan referral to sleep specialist.  This is likely cause for her daytime somnolence as well as her lower extremity edema.

## 2017-11-03 ENCOUNTER — Ambulatory Visit (HOSPITAL_BASED_OUTPATIENT_CLINIC_OR_DEPARTMENT_OTHER): Payer: BLUE CROSS/BLUE SHIELD | Attending: Family

## 2017-11-04 ENCOUNTER — Telehealth: Payer: Self-pay | Admitting: Family

## 2017-11-04 NOTE — Telephone Encounter (Signed)
-----   Message from Asencion Gowda sent at 11/03/2017 12:10 PM EDT ----- Regarding: No Show The above patient was a No show for her Echocardiogram this morning. A phone call was made and a message was left to call us if she needed to reschedule this test.

## 2017-11-13 ENCOUNTER — Ambulatory Visit (HOSPITAL_BASED_OUTPATIENT_CLINIC_OR_DEPARTMENT_OTHER): Payer: BLUE CROSS/BLUE SHIELD

## 2017-11-20 LAB — HM PAP SMEAR: HM Pap smear: NEGATIVE

## 2017-11-26 ENCOUNTER — Encounter: Payer: BLUE CROSS/BLUE SHIELD | Attending: General Surgery | Admitting: Registered"

## 2017-11-26 ENCOUNTER — Encounter: Payer: Self-pay | Admitting: Registered"

## 2017-11-26 ENCOUNTER — Ambulatory Visit (HOSPITAL_BASED_OUTPATIENT_CLINIC_OR_DEPARTMENT_OTHER)
Admission: RE | Admit: 2017-11-26 | Discharge: 2017-11-26 | Disposition: A | Discharge status: Home / Self Care | Payer: BLUE CROSS/BLUE SHIELD | Source: Ambulatory Visit | Attending: Family | Admitting: Family

## 2017-11-26 DIAGNOSIS — Z90722 Acquired absence of ovaries, bilateral: Secondary | ICD-10-CM | POA: Diagnosis not present

## 2017-11-26 DIAGNOSIS — Z7982 Long term (current) use of aspirin: Secondary | ICD-10-CM | POA: Diagnosis not present

## 2017-11-26 DIAGNOSIS — F329 Major depressive disorder, single episode, unspecified: Secondary | ICD-10-CM | POA: Diagnosis not present

## 2017-11-26 DIAGNOSIS — Z8261 Family history of arthritis: Secondary | ICD-10-CM | POA: Diagnosis not present

## 2017-11-26 DIAGNOSIS — Z90710 Acquired absence of both cervix and uterus: Secondary | ICD-10-CM | POA: Insufficient documentation

## 2017-11-26 DIAGNOSIS — F419 Anxiety disorder, unspecified: Secondary | ICD-10-CM | POA: Diagnosis not present

## 2017-11-26 DIAGNOSIS — Z880 Allergy status to penicillin: Secondary | ICD-10-CM | POA: Insufficient documentation

## 2017-11-26 DIAGNOSIS — Z79899 Other long term (current) drug therapy: Secondary | ICD-10-CM | POA: Diagnosis not present

## 2017-11-26 DIAGNOSIS — Z6841 Body Mass Index (BMI) 40.0 and over, adult: Secondary | ICD-10-CM | POA: Insufficient documentation

## 2017-11-26 DIAGNOSIS — R011 Cardiac murmur, unspecified: Secondary | ICD-10-CM | POA: Diagnosis not present

## 2017-11-26 DIAGNOSIS — I10 Essential (primary) hypertension: Secondary | ICD-10-CM | POA: Diagnosis not present

## 2017-11-26 DIAGNOSIS — G4730 Sleep apnea, unspecified: Secondary | ICD-10-CM | POA: Insufficient documentation

## 2017-11-26 DIAGNOSIS — E669 Obesity, unspecified: Secondary | ICD-10-CM

## 2017-11-26 DIAGNOSIS — Z713 Dietary counseling and surveillance: Secondary | ICD-10-CM | POA: Insufficient documentation

## 2017-11-26 DIAGNOSIS — Z8249 Family history of ischemic heart disease and other diseases of the circulatory system: Secondary | ICD-10-CM | POA: Diagnosis not present

## 2017-11-26 DIAGNOSIS — M1990 Unspecified osteoarthritis, unspecified site: Secondary | ICD-10-CM | POA: Diagnosis not present

## 2017-11-26 NOTE — Patient Instructions (Addendum)
-   Aim to increase physical activity to at 15 minutes, 2-3 days/week.   - Find water-related physical activity option in [place] area.   - Have breakfast consistently.

## 2017-11-26 NOTE — Progress Notes (Signed)
Appt start time: 10:50 end time: 11:14  Assessment: 1st SWL Appointment.   Start Wt at NDES: 346.5 Wt: 361.3 BMI: 64.00  Letter of approval needs to be faxed to [facility] Bariatric Surgery Program coordinator. Letter is already completed in S drive.   Pt arrives with granddaughter having gained 14.8 lbs from previous visit.   Pt states she has been able to wait 20 min after eating to drink. Pt states she has been chewing at least 30 times per bite most times. Pt states she is able to determine when she is satisfied with her meal. Pt states she has been staying in [place] during the week to help family and returns to [facility] for appointments. Pt states she has been avoiding sodas.   Pt states she likes strawberry, milk chocolate flavors of [employer].   It is my professional opinion that pt is not ready for bariatric surgery yet.   Per insurance, pt needs 0 SWL visits prior to surgery. It is my recommendation that pt has a few SWL visits to prepare for surgery and assess readiness to ensure follow-up success. Pt will need education on the remainder of pre-op goals, protein shakes, and vitamin and mineral recommendations at next visit.   Next visit: . Avoid all sugar-sweetened beverages . Avoid alcohol . Consume 3 meals per day; eat every 3-5 hours . Make a list of non-food related activities . Aim for 64-100 ounces of FLUID daily  . Aim for at least 60-80 grams of PROTEIN daily . Look for a liquid protein source that contain ?15 g protein and ?5 g carbohydrate  (ex: shakes, drinks, shots) . Physical activity is an important part of a healthy lifestyle so keep it moving!  MEDICATIONS: See list   DIETARY INTAKE:  24-hr recall:  B ( AM): sometimes skips; protein shake  Snk ( AM): none  L ( PM): chicken salad + crackers or salad + fruit or burger Snk ( PM): frozen frozen bars or fruit D ( PM): [place] wings + cabbage + egg noodles Snk ( PM): none Beverages:  water, juice, decaf coffee  Usual physical activity: none stated  Diet to Follow: 1600 calories 180 g carbohydrates 120 g protein 44 g fat  Preferred Learning Style:   No preference indicated   Learning Readiness:   Contemplating  Ready  Change in progress     Nutritional Diagnosis:  [place]-3.3 Overweight/obesity related to past poor dietary habits and physical inactivity as evidenced by patient w/ planned sleeve gastrectomy surgery following dietary guidelines for continued weight loss.    Intervention:  Nutrition counseling for upcoming Bariatric Surgery.  Goals:  - Aim for 150 minutes of physical activity including cardio and weight bearing every week - Aim to increase physical activity to at 15 minutes, 2-3 days/week.  - Find water-related physical activity option in [place] area.  - Have breakfast consistently.   Teaching Method Utilized:  Visual Auditory Hands on  Handouts given during visit include:  none  Barriers to learning/adherence to lifestyle change: finances and contemplative stage of change  Demonstrated degree of understanding via:  Teach Back   Monitoring/Evaluation:  Dietary intake, exercise, and body weight in 1 month(s).

## 2017-11-26 NOTE — Progress Notes (Signed)
  Echocardiogram 2D Echocardiogram has been performed.  Terease Marcotte T Ousmane Seeman 11/26/2017, 1:08 PM

## 2017-11-27 ENCOUNTER — Encounter: Payer: Self-pay | Admitting: Family

## 2017-11-27 ENCOUNTER — Ambulatory Visit: Payer: BLUE CROSS/BLUE SHIELD | Admitting: Family

## 2017-11-27 VITALS — BP 130/86 | HR 80 | Temp 98.2°F | Resp 18 | Ht 63.0 in | Wt 361.8 lb

## 2017-11-27 DIAGNOSIS — G4733 Obstructive sleep apnea (adult) (pediatric): Secondary | ICD-10-CM | POA: Diagnosis not present

## 2017-11-27 DIAGNOSIS — F329 Major depressive disorder, single episode, unspecified: Secondary | ICD-10-CM | POA: Diagnosis not present

## 2017-11-27 DIAGNOSIS — F32A Depression, unspecified: Secondary | ICD-10-CM

## 2017-11-27 DIAGNOSIS — I10 Essential (primary) hypertension: Secondary | ICD-10-CM | POA: Diagnosis not present

## 2017-11-27 LAB — BASIC METABOLIC PANEL
BUN: 23 mg/dL (ref 6–23)
CHLORIDE: 103 meq/L (ref 96–112)
CO2: 31 mEq/L (ref 19–32)
Calcium: 9.2 mg/dL (ref 8.4–10.5)
Creatinine, Ser: 1.12 mg/dL (ref 0.40–1.20)
GFR: 65.25 mL/min (ref >60.00)
Glucose, Bld: 95 mg/dL (ref 70–99)
POTASSIUM: 3.9 meq/L (ref 3.5–5.1)
SODIUM: 140 meq/L (ref 135–145)

## 2017-11-27 LAB — ECHOCARDIOGRAM COMPLETE
Height: 63 in
Weight: 5780.8 oz

## 2017-11-27 NOTE — Progress Notes (Addendum)
Subjective:    Patient ID: Hailey Vance, female    DOB: 12-18-1965, 52 y.o.   MRN: [id number]  HPI  Patient is a 52 yr old female who presents today for follow up.  HTN-last visit blood pressure was elevated and we increased her lisinopril hydrochlorothiazide from 1/2 tablet once daily to a full tablet once daily. BP Readings from Last 3 Encounters:  11/27/17 130/86  10/27/17 (!) 150/100  08/18/17 (!) 152/106   Depression-last visit she noted ongoing depression symptoms.  We increase her dose of Effexor. She notes that her mood is "a little calmer." Notes that she is still tearful at times. Reports that she   Murmur-last visit we noted a new murmur.  A 2D echo was ordered.  This was completed yesterday.  Results are pending.  Obstructive sleep apnea-last noted to have severe obstructive sleep apnea on a 2017 split-night study.  She was having daytime somnolence and lower extremity edema.  Referral was made to sleep specialist and an order was placed for home CPAP.  She is scheduled for her sleep consult on October 10. She is working on getting her deductible.  Reports that she has filed for disability. This is due to her chronic low back pain and knee pain.    Review of Systems    see HPI  Past Medical History:  Diagnosis Date  . Anxiety   . Arthritis    osteoarthritis - knees  . Blood transfusion 1990   In [place], [place] with C/S Surgery - 5 units transfused  . Blood transfusion without reported diagnosis 1990   after a c/section  . Chest pain   . Chronic back pain   . Depression   . Fibroid   . Headache(784.0)    otc med prn  . Hypertension   . Low blood potassium   . Plantar fasciitis of left foot   . Sleep apnea    CPAP  . Vitamin D deficiency 09/06/2014     Social History   Socioeconomic History  . Marital status: Divorced    Spouse name: Not on file  . Number of children: Not on file  . Years of education: Not on file  . Highest education level: Not on file   Occupational History  . Not on file  Social Needs  . Financial resource strain: Not on file  . Food insecurity:    Worry: Not on file    Inability: Not on file  . Transportation needs:    Medical: Not on file    Non-medical: Not on file  Tobacco Use  . Smoking status: Never Smoker  . Smokeless tobacco: Never Used  Substance and Sexual Activity  . Alcohol use: No    Alcohol/week: 0.0 standard drinks  . Drug use: No  . Sexual activity: Not Currently    Partners: Male    Birth control/protection: Surgical    Comment: Tubal  Lifestyle  . Physical activity:    Days per week: Not on file    Minutes per session: Not on file  . Stress: Not on file  Relationships  . Social connections:    Talks on phone: Not on file    Gets together: Not on file    Attends religious service: Not on file    Active member of club or organization: Not on file    Attends meetings of clubs or organizations: Not on file    Relationship status: Not on file  . Intimate partner violence:  Fear of current or ex partner: Not on file    Emotionally abused: Not on file    Physically abused: Not on file    Forced sexual activity: Not on file  Other Topics Concern  . Not on file  Social History Narrative   Divorced   Lives with son and grand-daughter   [place] (lives in [place])   [place] lives with patient   65- Daughter [name] (lives in [place])   Works at [employer]   Completed some college   Careers adviser    Past Surgical History:  Procedure Laterality Date  . BILATERAL OOPHORECTOMY     Complete removal of left ovary and partial removal of right ovary.  . CESAREAN SECTION  1990   x 1  . COLONOSCOPY WITH PROPOFOL N/A 08/30/2015   Procedure: COLONOSCOPY WITH PROPOFOL;  Surgeon: Mauri Pole, MD;  Location: [facility];  Service: Endoscopy;  Laterality: N/A;  . CYSTOSCOPY N/A 05/23/2015   Procedure: CYSTOSCOPY;  Surgeon: Bobbye Charleston,  MD;  Location: [facility];  Service: Gynecology;  Laterality: N/A;  . HERNIA REPAIR    . HYSTEROSCOPY W/D&C N/A 01/11/2015   Procedure: DILATATION AND CURETTAGE /HYSTEROSCOPY;  Surgeon: Jerelyn Charles, MD;  Location: [facility];  Service: Gynecology;  Laterality: N/A;  . KNEE SURGERY     right knee  . LIPOMA EXCISION Left 12/02/2016   Procedure: EXCISION LIPOMA OF LEFT SHOULDER;  Surgeon: Garald Balding, MD;  Location: [facility];  Service: Orthopedics;  Laterality: Left;  . ROBOTIC ASSISTED TOTAL HYSTERECTOMY WITH SALPINGECTOMY Bilateral 05/23/2015   Procedure: ROBOTIC ASSISTED TOTAL HYSTERECTOMY WITH SALPINGECTOMYwith right oophorectomy;  Surgeon: Bobbye Charleston, MD;  Location: [facility];  Service: Gynecology;  Laterality: Bilateral;  . TUBAL LIGATION  1990    Family History  Problem Relation Age of Onset  . Lung cancer Mother 71       dec  . Cancer Mother   . Hypertension Mother   . Arthritis Mother   . Cancer Father 47       prostate  . Hypertension Father   . Leukemia Sister   . Hypertension Sister   . Arthritis Sister   . Hyperlipidemia Sister   . Hypertension Brother   . Arthritis Brother   . Multiple sclerosis Sister   . Hypertension Sister   . Hypertension Sister   . Hypertension Brother   . Hypertension Brother   . Breast cancer Paternal Aunt   . Diabetes Maternal Grandmother   . Stroke Maternal Grandmother   . Heart attack Paternal Grandmother   . Asthma Other     Allergies  Allergen Reactions  . Penicillins Itching    Has patient had a PCN reaction causing immediate rash, facial/tongue/throat swelling, SOB or lightheadedness with hypotension: No Has patient had a PCN reaction causing severe rash involving mucus membranes or skin necrosis: No Has patient had a PCN reaction that required hospitalization No Has patient had a PCN reaction occurring within the last 10 years: No If all of the above answers are "NO", then may proceed with Cephalosporin use.     Current  Outpatient Medications on File Prior to Visit  Medication Sig Dispense Refill  . aspirin-acetaminophen-caffeine (EXCEDRIN MIGRAINE) 250-250-65 MG per tablet Take 2 tablets by mouth every 6 (six) hours as needed for headache or migraine.    Marland Kitchen lisinopril-hydrochlorothiazide (PRINZIDE,ZESTORETIC) 20-25 MG tablet TAKE one TABLET BY MOUTH ONCE DAILY 30 tablet 5  . meloxicam (MOBIC)  7.5 MG tablet Take 1 tablet (7.5 mg total) by mouth daily. As needed for pain 90 tablet 1  . Menthol, Topical Analgesic, (BENGAY EX) Apply 1 application topically daily as needed (knee pain).    . NYSTATIN powder APPLY  POWDER TOPICALLY TO AFFECTED AREA TWICE DAILY AS NEEDED 45 g 1  . venlafaxine XR (EFFEXOR-XR) 150 MG 24 hr capsule Take 1 capsule (150 mg total) by mouth daily with breakfast. 30 capsule 2  . [DISCONTINUED] LISINOPRIL PO Take by mouth.     No current facility-administered medications on file prior to visit.     BP 130/86 (BP Location: Left Arm, Patient Position: Sitting, Cuff Size: Large)   Pulse 80   Temp 98.2 F (36.8 C) (Oral)   Resp 18   Ht 5\' 3"  (1.6 m)   Wt (!) 361 lb 12.8 oz (164.1 kg)   LMP 10/02/2007 Comment: but had bleeding 18 months ago for short period of time and began again 08/2013  SpO2 99%   BMI 64.09 kg/m    Objective:   Physical Exam  Constitutional: She appears well-developed and well-nourished.  Cardiovascular: Normal rate and regular rhythm.  Murmur heard. 2+ bilateral LE edema  Pulmonary/Chest: Effort normal and breath sounds normal. No respiratory distress. She has no wheezes.  Psychiatric: She has a normal mood and affect. Her behavior is normal. Judgment and thought content normal.          Assessment & Plan:  Hypertension- blood pressure is stable.  Continue current meds.  Obtain follow-up basic metabolic panel.  Obstructive sleep apnea-she is working on saving up to complete her co-pay for her CPAP.  I encouraged her to begin this as soon as possible and keep  her upcoming appointment with sleep specialist.  Morbid obesity-she continues to work with the bariatric clinic.  I really think that weight loss would help significantly with her back and knee pain.  Depression- this is improved but still not optimized.  I have suggested that she schedule an appointment with psychiatry for further medication management.  We will continue current dose of Effexor.  Declines flu shot today.

## 2017-11-27 NOTE — Patient Instructions (Signed)
Please complete lab work prior to leaving. Schedule an appointment with psychiatry.   Psychiatric Services:  [facility], [place] [address], [place], [place] [facility] [place], [place], [facility], [address], [place], [place]

## 2017-12-10 ENCOUNTER — Ambulatory Visit (INDEPENDENT_AMBULATORY_CARE_PROVIDER_SITE_OTHER): Payer: BLUE CROSS/BLUE SHIELD | Admitting: Pulmonary Disease

## 2017-12-10 ENCOUNTER — Telehealth: Payer: Self-pay | Admitting: Pulmonary Disease

## 2017-12-10 ENCOUNTER — Encounter: Payer: Self-pay | Admitting: Pulmonary Disease

## 2017-12-10 VITALS — BP 116/82 | HR 89 | Ht 64.0 in | Wt 354.0 lb

## 2017-12-10 DIAGNOSIS — E669 Obesity, unspecified: Secondary | ICD-10-CM | POA: Diagnosis not present

## 2017-12-10 DIAGNOSIS — G4733 Obstructive sleep apnea (adult) (pediatric): Secondary | ICD-10-CM

## 2017-12-10 DIAGNOSIS — G4730 Sleep apnea, unspecified: Secondary | ICD-10-CM

## 2017-12-10 DIAGNOSIS — J351 Hypertrophy of tonsils: Secondary | ICD-10-CM

## 2017-12-10 NOTE — Telephone Encounter (Signed)
Patient was seen in office today stating her PCP placed cpap order to DME already. Pt was advised from DME that she had to pay upfront $162, then a monthly fee. Pt is unable to afford this at this rate.  Not sure who the DME is at this time, there was a referral from [facility] 10-29-17 for cpap order VS would like someone in [facility] to help patient with this situation please.  [facility] please advise. Thank you.

## 2017-12-10 NOTE — Progress Notes (Signed)
[facility]  Chief Complaint  Patient presents with  . sleep consult    Pt referred by Earlie Counts. Pt sleeps about 2 hours, than awake for hours, and has daytime sleepiness. Pt takes two 8min naps daily.    Constitutional:  BP 116/82 (BP Location: Left Arm, Cuff Size: Normal)   Pulse 89   Ht 5\' 4"  (1.626 m)   Wt (!) 354 lb (160.6 kg)   LMP 10/02/2007 Comment: but had bleeding 18 months ago for short period of time and began again 08/2013  SpO2 95%   BMI 60.76 kg/m   Past Medical History:  Anxiety, OA, Chronic back pain, Depression, Fibroids, HA, HTN, Plantar fasciitis, Vit D deficiency  Brief Summary:  Hailey Vance is a 52 y.o. female with obstructive sleep apnea.  She has struggled with her sleep for years.  This has been getting worse.  She snores and wakes up feeling like her throat closes.  She wakes up frequently during the night.  She is tired all day long and has trouble staying awake.  She had a sleep study in 2017.  Found to have severe sleep apnea.  Was to be set up with CPAP, but had surgery and then never was able to get back around to getting CPAP.  She goes to sleep at 11 pm.  She falls asleep in 15 minutes.  She wakes up 2 or 3 times to use the bathroom.  She gets out of bed at 8 am.  She feels tired in the morning.  She frequently gets morning headache.  She does not use anything to help her fall sleep or stay awake.  She denies sleep walking, sleep talking, bruxism, or nightmares.  There is no history of restless legs.  She denies sleep hallucinations, sleep paralysis, or cataplexy.  The Epworth score is 20 out of 24.  She has enrolled in the weight loss program at [facility].  She is working with a Financial planner.  She is hoping to have weight loss surgery before the end of the year.  She had order placed to start CPAP.  She was told by her DME that she didn't need repeat sleep study.  Unfortunately, she was told she  had to pay $160 upfront, and then would have a monthly fee after that.  She wouldn't be able to afford this.    Physical Exam:   Appearance - well kempt  ENMT - clear nasal mucosa, midline nasal septum, no oral exudates, no LAN, trachea midline, 4+ tonsils Respiratory - normal chest wall, normal respiratory effort, no accessory muscle use, no wheeze/rales CV - s1s2 regular rate and rhythm, no murmurs, 1+ peripheral edema, radial pulses symmetric GI - soft, non tender, no masses Lymph - no adenopathy noted in neck and axillary areas MSK - normal muscle strength and tone, normal gait Ext - no cyanosis, clubbing, or joint inflammation noted Skin - no rashes, lesions, or ulcers Neuro - oriented to person, place, and time Psych - normal mood and affect  Discussion:  She reports snoring, sleep disruption, apnea, and daytime sleepiness.  Her BMI is > 35.  She has history of hypertension and depression.  Her sleep study showed severe obstructive sleep apnea.  Assessment/Plan:    Snoring with excessive daytime sleepiness from obstructive sleep apnea. - will make sure she can get CPAP set up  Obesity. - discussed how weight can impact sleep and risk for sleep disordered breathing - discussed options  to assist with weight loss: combination of diet modification, cardiovascular and strength training exercises - she is enrolled in the weight loss program at [facility]  Cardiovascular risk. - had an extensive discussion regarding the adverse health consequences related to untreated sleep disordered breathing - specifically discussed the risks for hypertension, coronary artery disease, cardiac dysrhythmias, cerebrovascular disease, and diabetes - lifestyle modification discussed  Safe driving practices. - discussed how sleep disruption can increase risk of accidents, particularly when driving - safe driving practices were discussed  Therapies for obstructive sleep apnea. - various therapies for  treatment were reviewed: CPAP, oral appliance, and surgical interventions  Tonsillar hypertrophy. - if she has trouble adjusting to CPAP, then she might benefit from ENT assessment    Patient Instructions  Will check with your home care company about options to make sure you can get CPAP set up  Follow up in 2 months after getting CPAP machine   Chesley Mires, MD [place] Pager: [phone] 12/10/2017, 11:58 AM  Flow Sheet    Sleep tests:  PSG 04/09/15 >> AHI 55, SpO2 low 81%  Cardiac tests:  Echo 11/26/17 >> EF 55 to 60%, PAS 37 mmHg   Review of Systems:  Constitutional: Negative for fever and unexpected weight change.  HENT: Positive for congestion and sore throat. Negative for dental problem, ear pain, nosebleeds, postnasal drip, rhinorrhea, sinus pressure, sneezing and trouble swallowing.   Eyes: Negative for redness and itching.  Respiratory: Positive for shortness of breath and wheezing. Negative for cough and chest tightness.   Cardiovascular: Positive for leg swelling. Negative for palpitations.  Gastrointestinal: Negative for nausea and vomiting.  Genitourinary: Negative for dysuria.  Musculoskeletal: Positive for joint swelling.  Skin: Negative for rash.  Allergic/Immunologic: Negative.  Negative for environmental allergies, food allergies and immunocompromised state.  Neurological: Positive for headaches.  Hematological: Does not bruise/bleed easily.  Psychiatric/Behavioral: Negative for dysphoric mood. The patient is not nervous/anxious.    Medications:   Allergies as of 12/10/2017      Reactions   Penicillins Itching   Has patient had a PCN reaction causing immediate rash, facial/tongue/throat swelling, SOB or lightheadedness with hypotension: No Has patient had a PCN reaction causing severe rash involving mucus membranes or skin necrosis: No Has patient had a PCN reaction that required hospitalization No Has patient had a PCN  reaction occurring within the last 10 years: No If all of the above answers are "NO", then may proceed with Cephalosporin use.      Medication List        Accurate as of 12/10/17 11:58 AM. Always use your most recent med list.          aspirin-acetaminophen-caffeine 250-250-65 MG tablet Commonly known as:  EXCEDRIN MIGRAINE Take 2 tablets by mouth every 6 (six) hours as needed for headache or migraine.   BENGAY EX Apply 1 application topically daily as needed (knee pain).   lisinopril-hydrochlorothiazide 20-25 MG tablet Commonly known as:  PRINZIDE,ZESTORETIC TAKE one TABLET BY MOUTH ONCE DAILY   meloxicam 7.5 MG tablet Commonly known as:  MOBIC Take 1 tablet (7.5 mg total) by mouth daily. As needed for pain   nystatin powder Generic drug:  nystatin APPLY  POWDER TOPICALLY TO AFFECTED AREA TWICE DAILY AS NEEDED   venlafaxine XR 150 MG 24 hr capsule Commonly known as:  EFFEXOR-XR Take 1 capsule (150 mg total) by mouth daily with breakfast.       Past Surgical History:  She  has a past  surgical history that includes Cesarean section (1990); Hernia repair; Bilateral oophorectomy; Knee surgery; Tubal ligation (1990); Hysteroscopy w/D&C (N/A, 01/11/2015); Robotic assisted total hysterectomy with salpingectomy (Bilateral, 05/23/2015); Cystoscopy (N/A, 05/23/2015); Colonoscopy with propofol (N/A, 08/30/2015); and Lipoma excision (Left, 12/02/2016).  Family History:  Her family history includes Arthritis in her brother, mother, and sister; Asthma in her other; Breast cancer in her paternal aunt; Cancer in her mother; Cancer (age of onset: 36) in her father; Diabetes in her maternal grandmother; Heart attack in her paternal grandmother; Hyperlipidemia in her sister; Hypertension in her brother, brother, brother, father, mother, sister, sister, and sister; Leukemia in her sister; Lung cancer (age of onset: 55) in her mother; Multiple sclerosis in her sister; Stroke in her maternal  grandmother.  Social History:  She  reports that she has never smoked. She has never used smokeless tobacco. She reports that she does not drink alcohol or use drugs.

## 2017-12-10 NOTE — Patient Instructions (Signed)
Will check with your home care company about options to make sure you can get CPAP set up  Follow up in 2 months after getting CPAP machine

## 2017-12-10 NOTE — Progress Notes (Signed)
   Subjective:    Patient ID: Hailey Vance, female    DOB: [birth year], 52 y.o.   MRN: [id number]  HPI    Review of Systems  Constitutional: Negative for fever and unexpected weight change.  HENT: Positive for congestion and sore throat. Negative for dental problem, ear pain, nosebleeds, postnasal drip, rhinorrhea, sinus pressure, sneezing and trouble swallowing.   Eyes: Negative for redness and itching.  Respiratory: Positive for shortness of breath and wheezing. Negative for cough and chest tightness.   Cardiovascular: Positive for leg swelling. Negative for palpitations.  Gastrointestinal: Negative for nausea and vomiting.  Genitourinary: Negative for dysuria.  Musculoskeletal: Positive for joint swelling.  Skin: Negative for rash.  Allergic/Immunologic: Negative.  Negative for environmental allergies, food allergies and immunocompromised state.  Neurological: Positive for headaches.  Hematological: Does not bruise/bleed easily.  Psychiatric/Behavioral: Negative for dysphoric mood. The patient is not nervous/anxious.        Objective:   Physical Exam        Assessment & Plan:

## 2017-12-10 NOTE — Telephone Encounter (Signed)
This pt has insurance we do not have a program for her unless she is un insured Hailey Vance

## 2017-12-15 NOTE — Telephone Encounter (Signed)
She might be able to buy a CPAP machine on her own and this could be less expensive than going through a DME.  She can look up CPAP machine options at CPAP.[email or web address] or similar website.

## 2017-12-16 NOTE — Telephone Encounter (Signed)
Attempted to call patient today regarding VS recommendations below. I did not receive an answer at time of call. I have left a voicemail message for pt to return call. X1  

## 2017-12-17 NOTE — Telephone Encounter (Signed)
Called and spoke with patient regarding VS recommendations. Pt agreed, and verbalized understanding Pt will be purchasing her own cpap online this week. Nothing further needed at this time.

## 2017-12-28 ENCOUNTER — Encounter: Payer: BLUE CROSS/BLUE SHIELD | Attending: General Surgery | Admitting: Registered"

## 2017-12-28 ENCOUNTER — Encounter: Payer: Self-pay | Admitting: Registered"

## 2017-12-28 DIAGNOSIS — Z6841 Body Mass Index (BMI) 40.0 and over, adult: Secondary | ICD-10-CM | POA: Insufficient documentation

## 2017-12-28 DIAGNOSIS — G4730 Sleep apnea, unspecified: Secondary | ICD-10-CM | POA: Insufficient documentation

## 2017-12-28 DIAGNOSIS — I10 Essential (primary) hypertension: Secondary | ICD-10-CM | POA: Diagnosis not present

## 2017-12-28 DIAGNOSIS — F419 Anxiety disorder, unspecified: Secondary | ICD-10-CM | POA: Insufficient documentation

## 2017-12-28 DIAGNOSIS — Z713 Dietary counseling and surveillance: Secondary | ICD-10-CM | POA: Insufficient documentation

## 2017-12-28 DIAGNOSIS — F329 Major depressive disorder, single episode, unspecified: Secondary | ICD-10-CM | POA: Diagnosis not present

## 2017-12-28 DIAGNOSIS — Z7982 Long term (current) use of aspirin: Secondary | ICD-10-CM | POA: Insufficient documentation

## 2017-12-28 DIAGNOSIS — M1990 Unspecified osteoarthritis, unspecified site: Secondary | ICD-10-CM | POA: Insufficient documentation

## 2017-12-28 DIAGNOSIS — Z8249 Family history of ischemic heart disease and other diseases of the circulatory system: Secondary | ICD-10-CM | POA: Insufficient documentation

## 2017-12-28 DIAGNOSIS — E669 Obesity, unspecified: Secondary | ICD-10-CM

## 2017-12-28 DIAGNOSIS — Z79899 Other long term (current) drug therapy: Secondary | ICD-10-CM | POA: Insufficient documentation

## 2017-12-28 DIAGNOSIS — Z90722 Acquired absence of ovaries, bilateral: Secondary | ICD-10-CM | POA: Insufficient documentation

## 2017-12-28 DIAGNOSIS — Z8261 Family history of arthritis: Secondary | ICD-10-CM | POA: Insufficient documentation

## 2017-12-28 DIAGNOSIS — Z90710 Acquired absence of both cervix and uterus: Secondary | ICD-10-CM | POA: Diagnosis not present

## 2017-12-28 DIAGNOSIS — Z880 Allergy status to penicillin: Secondary | ICD-10-CM | POA: Diagnosis not present

## 2017-12-28 NOTE — Progress Notes (Signed)
Appt start time: 11:30 end time: 12:06  Assessment: 1st SWL Appointment.   Start Wt at [facility]: 346.5 Wt: 361.3 BMI: 64.00  Letter of approval needs to be faxed to [facility] Surgery Bariatric Surgery Program coordinator. Letter is already completed in S drive.   Pt arrives with granddaughter having gained 14.8 lbs from previous visit. Pt states she has been having breakfast daily. Pt states she is doing well not drinking around eating.   Pt states she has been chewing at least 30 times per bite most times. Pt states she is able to determine when she is satisfied with her meal. Pt states she has been staying in [place] during the week to help family and returns to [facility] for appointments. Pt states she has been avoiding sodas.   Pt states she likes strawberry and chocolate flavors of Premier Protein.   It is my professional opinion that pt is not ready for bariatric surgery yet.   Per insurance, pt needs 0 SWL visits prior to surgery. It is my recommendation that pt has a few SWL visits to prepare for surgery and assess readiness to ensure follow-up success.    MEDICATIONS: See list   DIETARY INTAKE:  24-hr recall:  B ( AM): egg sandwich + tomatoes + fruit cup or cereal + applesauce or protein shake  Snk ( AM): none  L ( PM): chicken salad + crackers or salad + fruit or burger Snk ( PM): frozen frozen bars or fruit D ( PM): [place] wings + cabbage + egg noodles Snk ( PM): none Beverages: water, juice, decaf coffee, 2% milk  Usual physical activity: none stated  Diet to Follow: 1600 calories 180 g carbohydrates 120 g protein 44 g fat  Preferred Learning Style:   No preference indicated   Learning Readiness:   Contemplating  Ready  Change in progress   Nutritional Diagnosis:  [place]-3.3 Overweight/obesity related to past poor dietary habits and physical inactivity as evidenced by patient w/ planned sleeve gastrectomy surgery following dietary guidelines for  continued weight loss.    Intervention:  Nutrition counseling for upcoming Bariatric Surgery. Pt was educated and counseled on the remainder of pre-op goals, appropriate protein shakes, and vitamin and mineral recommendations. Pt was in agreement with goals listed.  Goals:  - Aim to drink at least 64 ounces of fluid a day.  - Aim to increase physical activity to arm exercises at least 15 minutes, every other day. See handout.  - Keep up the great work eating 3 meals a day and other habits already established.   Teaching Method Utilized:  Visual Auditory Hands on  Handouts given during visit include:  Arm exercises  Barriers to learning/adherence to lifestyle change: finances and contemplative stage of change  Demonstrated degree of understanding via:  Teach Back   Monitoring/Evaluation:  Dietary intake, exercise, and body weight in 1 month(s).

## 2017-12-28 NOTE — Patient Instructions (Addendum)
-   Aim to drink at least 64 ounces of fluid a day.   - Aim to increase physical activity to arm exercises at least 15 minutes, every other day. See handout.   - Keep up the great work eating 3 meals a day and other habits already established.

## 2018-01-04 ENCOUNTER — Encounter: Payer: BLUE CROSS/BLUE SHIELD | Attending: General Surgery | Admitting: Registered"

## 2018-01-04 DIAGNOSIS — Z8249 Family history of ischemic heart disease and other diseases of the circulatory system: Secondary | ICD-10-CM | POA: Diagnosis not present

## 2018-01-04 DIAGNOSIS — F419 Anxiety disorder, unspecified: Secondary | ICD-10-CM | POA: Diagnosis not present

## 2018-01-04 DIAGNOSIS — Z8261 Family history of arthritis: Secondary | ICD-10-CM | POA: Insufficient documentation

## 2018-01-04 DIAGNOSIS — Z6841 Body Mass Index (BMI) 40.0 and over, adult: Secondary | ICD-10-CM | POA: Diagnosis not present

## 2018-01-04 DIAGNOSIS — I10 Essential (primary) hypertension: Secondary | ICD-10-CM | POA: Diagnosis not present

## 2018-01-04 DIAGNOSIS — Z713 Dietary counseling and surveillance: Secondary | ICD-10-CM | POA: Insufficient documentation

## 2018-01-04 DIAGNOSIS — Z90710 Acquired absence of both cervix and uterus: Secondary | ICD-10-CM | POA: Insufficient documentation

## 2018-01-04 DIAGNOSIS — Z79899 Other long term (current) drug therapy: Secondary | ICD-10-CM | POA: Insufficient documentation

## 2018-01-04 DIAGNOSIS — F329 Major depressive disorder, single episode, unspecified: Secondary | ICD-10-CM | POA: Insufficient documentation

## 2018-01-04 DIAGNOSIS — G4730 Sleep apnea, unspecified: Secondary | ICD-10-CM | POA: Diagnosis not present

## 2018-01-04 DIAGNOSIS — Z880 Allergy status to penicillin: Secondary | ICD-10-CM | POA: Diagnosis not present

## 2018-01-04 DIAGNOSIS — Z90722 Acquired absence of ovaries, bilateral: Secondary | ICD-10-CM | POA: Insufficient documentation

## 2018-01-04 DIAGNOSIS — Z7982 Long term (current) use of aspirin: Secondary | ICD-10-CM | POA: Insufficient documentation

## 2018-01-04 DIAGNOSIS — E669 Obesity, unspecified: Secondary | ICD-10-CM

## 2018-01-04 DIAGNOSIS — M1990 Unspecified osteoarthritis, unspecified site: Secondary | ICD-10-CM | POA: Diagnosis not present

## 2018-01-04 NOTE — Progress Notes (Signed)
  Pre-Operative Nutrition Class:  Appt start time: 8:15   End time:  9:15.  Patient was seen on 01/04/2018 for Pre-Operative Bariatric Surgery [facility]   Surgery date: TBD Surgery type: Sleeve Start weight at [facility] 346.5 Weight today: 355.2   Samples given per MNT protocol. Patient educated on appropriate usage: Bariatric Advantage Multivitamin Lot # [id number] Exp: 12/2018  Bariatric Advantage Calcium Citrate Lot # [id number] Exp: 09/04/2018  Renee Pain Protein Shake Lot # [id number] Exp: 05/08/2018   The following the learning objectives were met by the patient during this course:  Identify Pre-Op Dietary Goals and will begin 2 weeks pre-operatively  Identify appropriate sources of fluids and proteins   State protein recommendations and appropriate sources pre and post-operatively  Identify Post-Operative Dietary Goals and will follow for 2 weeks post-operatively  Identify appropriate multivitamin and calcium sources  Describe the need for physical activity post-operatively and will follow MD recommendations  State when to call healthcare provider regarding medication questions or post-operative complications  Handouts given during class include:  Pre-Op Bariatric Surgery Diet Handout  Protein Shake Handout  Post-Op Bariatric Surgery Nutrition Handout  BELT Program Information Flyer  Support Group Information Flyer  [facility] Bariatric Supplements Price List  Follow-Up Plan: Patient will follow-up at [facility] 2 weeks post operatively for diet advancement per MD.

## 2018-01-12 ENCOUNTER — Ambulatory Visit (HOSPITAL_COMMUNITY)
Admission: RE | Admit: 2018-01-12 | Discharge: 2018-01-12 | Disposition: A | Discharge status: Home / Self Care | Payer: BLUE CROSS/BLUE SHIELD | Source: Ambulatory Visit | Attending: General Surgery | Admitting: General Surgery

## 2018-01-12 DIAGNOSIS — K449 Diaphragmatic hernia without obstruction or gangrene: Secondary | ICD-10-CM | POA: Insufficient documentation

## 2018-01-12 DIAGNOSIS — K219 Gastro-esophageal reflux disease without esophagitis: Secondary | ICD-10-CM | POA: Insufficient documentation

## 2018-01-12 DIAGNOSIS — Z01818 Encounter for other preprocedural examination: Secondary | ICD-10-CM | POA: Insufficient documentation

## 2018-01-22 ENCOUNTER — Telehealth: Payer: Self-pay | Admitting: *Deleted

## 2018-01-22 NOTE — Telephone Encounter (Signed)
Received request for Medical Records from [place] [employer]; forwarded to Medical Records via email/scan/SLS  

## 2018-01-25 ENCOUNTER — Encounter: Payer: Self-pay | Admitting: Family

## 2018-01-25 MED ORDER — VENLAFAXINE HCL 75 MG PO TABS: 75.0000 mg | ORAL_TABLET | Freq: Two times a day (BID) | ORAL | 3 refills | Status: DC

## 2018-01-26 ENCOUNTER — Encounter: Payer: Self-pay | Admitting: Registered"

## 2018-01-26 ENCOUNTER — Encounter: Payer: BLUE CROSS/BLUE SHIELD | Admitting: Registered"

## 2018-01-26 DIAGNOSIS — E669 Obesity, unspecified: Secondary | ICD-10-CM

## 2018-01-26 DIAGNOSIS — Z713 Dietary counseling and surveillance: Secondary | ICD-10-CM | POA: Diagnosis not present

## 2018-01-26 NOTE — Patient Instructions (Signed)
-   Keep up the great work with habits already established!

## 2018-01-26 NOTE — Progress Notes (Signed)
Appt start time: 11:53 end time: 12:07  Assessment: 3rd SWL Appointment.   Start Wt at [facility]: 346.5 Wt: 355.9 BMI: 63.04  Letter of approval was faxed to [facility] Surgery Bariatric Surgery Program coordinator on 01/26/2018.   Pt arrives with granddaughters having lost 5.4 lbs from previous visit. Pt states she has been having breakfast daily. Pt states she is doing well not drinking around eating.   Pt states she has tentative surgery date of 12/16. Pt states she will begin pre-op diet on 12/1 to get a head start. Pt states she is drinking at least 64 oz a day.   Pt states she has been chewing at least 30 times per bite most times. Pt states she is able to determine when she is satisfied with her meal. Pt states she has been staying in [place] during the week to help family and returns to [facility] for appointments only. Pt states she plans to return to [place] after having surgery. Pt states she has been avoiding sodas.   Pt states she likes strawberry and chocolate flavors of Premier Protein. Pt states she is in the process of ordering Opurity bariatric multivitamins. Pt is doing well making behavioral changes towards bariatric surgery.   Per insurance, pt needs 0 SWL visits prior to surgery.  MEDICATIONS: See list   DIETARY INTAKE:  24-hr recall:  B ( AM): egg sandwich + tomatoes + fruit cup or cereal + applesauce or protein shake  Snk ( AM): none  L ( PM): chicken salad + crackers or salad + fruit or burger Snk ( PM): frozen frozen bars or fruit D ( PM): [place] wings + cabbage + egg noodles Snk ( PM): none Beverages: water, juice, decaf coffee, 2% milk  Usual physical activity: arm exercises; plans to start water aerobics soon  Diet to Follow: 1600 calories 180 g carbohydrates 120 g protein 44 g fat  Preferred Learning Style:   No preference indicated   Learning Readiness:   Contemplating  Ready  Change in progress   Nutritional Diagnosis:  [place]-3.3  Overweight/obesity related to past poor dietary habits and physical inactivity as evidenced by patient w/ planned sleeve gastrectomy surgery following dietary guidelines for continued weight loss.    Intervention:  Nutrition counseling for upcoming Bariatric Surgery. Pt was reminded to continue with habits already established. Pt was also reminded of pre-op diet and when to being it. Pt was in agreement with goals listed.  Goals:  - Keep up the great work with habits already established!  Teaching Method Utilized:  Visual Auditory Hands on  Handouts given during visit include:  Arm exercises  Barriers to learning/adherence to lifestyle change: preparation stage of change  Demonstrated degree of understanding via:  Teach Back   Monitoring/Evaluation:  Dietary intake, exercise, and body weight prn.

## 2018-02-09 ENCOUNTER — Ambulatory Visit: Payer: BLUE CROSS/BLUE SHIELD

## 2018-02-10 NOTE — Progress Notes (Signed)
Please place orders in epic pt. Has a preop appt.12-12 @ 0730 am Thank You!

## 2018-02-11 ENCOUNTER — Ambulatory Visit: Payer: Self-pay | Admitting: General Surgery

## 2018-02-11 ENCOUNTER — Telehealth: Payer: Self-pay | Admitting: Family

## 2018-02-11 ENCOUNTER — Encounter (HOSPITAL_COMMUNITY)
Admission: RE | Admit: 2018-02-11 | Discharge: 2018-02-11 | Disposition: A | Discharge status: Home / Self Care | Payer: BLUE CROSS/BLUE SHIELD | Source: Ambulatory Visit | Attending: General Surgery | Admitting: General Surgery

## 2018-02-11 ENCOUNTER — Other Ambulatory Visit: Payer: Self-pay

## 2018-02-11 ENCOUNTER — Encounter (HOSPITAL_COMMUNITY): Payer: Self-pay

## 2018-02-11 DIAGNOSIS — Z01812 Encounter for preprocedural laboratory examination: Secondary | ICD-10-CM | POA: Diagnosis not present

## 2018-02-11 HISTORY — DX: Cardiac murmur, unspecified: R01.1

## 2018-02-11 LAB — BASIC METABOLIC PANEL
ANION GAP: 10 (ref 5–15)
BUN: 24 mg/dL — ABNORMAL HIGH (ref 6–20)
CALCIUM: 9 mg/dL (ref 8.9–10.3)
CO2: 24 mmol/L (ref 22–32)
Chloride: 104 mmol/L (ref 98–111)
Creatinine, Ser: 1.25 mg/dL — ABNORMAL HIGH (ref 0.44–1.00)
GFR calc Af Amer: 57 mL/min — ABNORMAL LOW (ref >60)
GFR calc non Af Amer: 49 mL/min — ABNORMAL LOW (ref >60)
Glucose, Bld: 105 mg/dL — ABNORMAL HIGH (ref 70–99)
Potassium: 3.8 mmol/L (ref 3.5–5.1)
Sodium: 138 mmol/L (ref 135–145)

## 2018-02-11 LAB — DIFFERENTIAL
Basophils Absolute: 0.1 10*3/uL (ref 0.0–0.1)
Basophils Relative: 1 %
Eosinophils Absolute: 0.2 10*3/uL (ref 0.0–0.5)
Eosinophils Relative: 3 %
Lymphocytes Relative: 47 %
Lymphs Abs: 3 10*3/uL (ref 0.7–4.0)
Monocytes Absolute: 0.7 10*3/uL (ref 0.1–1.0)
Monocytes Relative: 12 %
Neutro Abs: 2.4 10*3/uL (ref 1.7–7.7)
Neutrophils Relative %: 38 %

## 2018-02-11 LAB — CBC
HCT: 43.1 % (ref 36.0–46.0)
Hemoglobin: 13.7 g/dL (ref 12.0–15.0)
MCH: 32 pg (ref 26.0–34.0)
MCHC: 31.8 g/dL (ref 30.0–36.0)
MCV: 100.7 fL — AB (ref 80.0–100.0)
NRBC: 0 % (ref 0.0–0.2)
Platelets: 242 10*3/uL (ref 150–400)
RBC: 4.28 MIL/uL (ref 3.87–5.11)
RDW: 13.1 % (ref 11.5–15.5)
WBC: 6.1 10*3/uL (ref 4.0–10.5)

## 2018-02-11 NOTE — Progress Notes (Signed)
FYI pt. At [facility] for preop. Surgery is scheduled for 02-15-18 her BP was elevated at 183/127 right arm and 186/114 left arm. Pt. States she did not take her BP med this am. Per anesthesia  Her lisinopril -HCTZ  are not to be  taken am before surgery so I am concerned her bp  Will be elevated DOS.

## 2018-02-11 NOTE — Progress Notes (Signed)
ekg 01-12-18 epic  echo 11-26-17 epic cxr 01-12-18 epic

## 2018-02-11 NOTE — Patient Instructions (Signed)
Hailey Vance  02/11/2018   Your procedure is scheduled on: 02-15-18  Report to [facility] Main  Entrance  Report to admitting at     1000 AM    Call this number if you have problems the morning of surgery [phone]    Remember: Do not eat food or drink liquids :After Midnight.  BRUSH YOUR TEETH MORNING OF SURGERY AND RINSE YOUR MOUTH OUT, NO CHEWING GUM CANDY OR MINTS.     Take these medicines the morning of surgery with A SIP OF WATER: effexor                                You may not have any metal on your body including hair pins and              piercings  Do not wear jewelry, make-up, lotions, powders or perfumes, deodorant             Do not wear nail polish.  Do not shave  48 hours prior to surgery.     Do not bring valuables to the hospital. [place].  Contacts, dentures or bridgework may not be worn into surgery.  Leave suitcase in the car. After surgery it may be brought to your room.               Please read over the following fact sheets you were given: _____________________________________________________________________           [facility] - Preparing for Surgery Before surgery, you can play an important role.  Because skin is not sterile, your skin needs to be as free of germs as possible.  You can reduce the number of germs on your skin by washing with CHG (chlorahexidine gluconate) soap before surgery.  CHG is an antiseptic cleaner which kills germs and bonds with the skin to continue killing germs even after washing. Please DO NOT use if you have an allergy to CHG or antibacterial soaps.  If your skin becomes reddened/irritated stop using the CHG and inform your nurse when you arrive at Short Stay. Do not shave (including legs and underarms) for at least 48 hours prior to the first CHG shower.  You may shave your face/neck. Please follow these instructions carefully:  1.  Shower  with CHG Soap the night before surgery and the  morning of Surgery.  2.  If you choose to wash your hair, wash your hair first as usual with your  normal  shampoo.  3.  After you shampoo, rinse your hair and body thoroughly to remove the  shampoo.                           4.  Use CHG as you would any other liquid soap.  You can apply chg directly  to the skin and wash                       Gently with a scrungie or clean washcloth.  5.  Apply the CHG Soap to your body ONLY FROM THE NECK DOWN.   Do not use on face/ open  Wound or open sores. Avoid contact with eyes, ears mouth and genitals (private parts).                       Wash face,  Genitals (private parts) with your normal soap.             6.  Wash thoroughly, paying special attention to the area where your surgery  will be performed.  7.  Thoroughly rinse your body with warm water from the neck down.  8.  DO NOT shower/wash with your normal soap after using and rinsing off  the CHG Soap.                9.  Pat yourself dry with a clean towel.            10.  Wear clean pajamas.            11.  Place clean sheets on your bed the night of your first shower and do not  sleep with pets. Day of Surgery : Do not apply any lotions/deodorants the morning of surgery.  Please wear clean clothes to the hospital/surgery center.  FAILURE TO FOLLOW THESE INSTRUCTIONS MAY RESULT IN THE CANCELLATION OF YOUR SURGERY PATIENT SIGNATURE_________________________________  NURSE SIGNATURE__________________________________  ________________________________________________________________________

## 2018-02-11 NOTE — Telephone Encounter (Signed)
Please contact pt and let her know that I would recommend that she take her lisinopril hct in the afternoon instead of the morning the day before her surgery.  This should help keep her blood pressure stable for surgery.

## 2018-02-12 NOTE — Progress Notes (Signed)
Pt. Called to pickup drink after order in epic at PST.

## 2018-02-12 NOTE — Telephone Encounter (Signed)
Called but no answer and unable to leave message, will try later.

## 2018-02-14 MED ORDER — BUPIVACAINE LIPOSOME 1.3 % IJ SUSP
20.0000 mL | Freq: Once | INTRAMUSCULAR | Status: DC
  Filled 2018-02-14: qty 20

## 2018-02-14 NOTE — H&P (Signed)
History of Present Illness  The patient is a 52 year old female who presents with obesity. Patient is referred by Dr Inda Castle for consideration for surgical treatment for morbid obesity. The patient gives a history of progressive obesity since early adulthood after the birth of her third child despite multiple attempts at medical management. She has been able lose a moderate amount of weight at times but then experiences progressive weight regain. Obesity has been affecting the patient in a number of ways including most notably severe knee pain which significantly limits her activity. She also experiences fatigue and shortness of breath with exertion that severely limits her activity. Significant co-morbid illnesses have developed including significant osteoarthritis of her knees, hypertension, obstructive sleep apnea and chronic recurrent lower extremity edema. She will require knee replacement but this cannot be done at her current BMI.   She has successfully completed her preoperative workup. No concerns on cyclic and nutritional evaluation. Upper GI series showed a possible tiny hiatal hernia and I told her we would look for this during surgery and repair if it appeared significant. Lab work was unremarkable. Her primary did detect a slight heart murmur recently and she has had a echocardiogram. She brought a report with her which showed no significant abnormalities with normal ventricular function. She generally has been feeling the same with no recent illnesses.   Problem List/Past Medical  PRIMARY OSTEOARTHRITIS OF BOTH KNEES (M17.0)  OBSTRUCTIVE SLEEP APNEA (G47.33)  HYPERTENSION, BENIGN (I10)  MORBID OBESITY, UNSPECIFIED OBESITY TYPE (E66.01)  LOWER EXTREMITY EDEMA (R60.0)   Past Surgical History  Cesarean Section - 1  Hysterectomy (not due to cancer) - Complete  Knee Surgery  Right. Oral Surgery  Shoulder Surgery  Left.  Diagnostic Studies History  Colonoscopy   within last year Mammogram  within last year Pap Smear  1-5 years ago  Allergies Penicillins  Hives. Allergies Reconciled   Medication History  Venlafaxine HCl (150MG  Capsule ER, Oral) Active. Lisinopril (2.5MG  Tablet, Oral) Active. Melatin (3-1MG  Tablet, Oral) Active. Aspirin (81MG  Tablet Chewable, Oral) Active. Methocarbamol (500MG  Tablet, Oral) Active. Mexiletine HCl Active.  Social History  Alcohol use  Occasional alcohol use. Caffeine use  Carbonated beverages, Coffee, Tea. No drug use  Tobacco use  Never smoker.  Family History  Arthritis  Brother, Mother, Sister. Cancer  Mother, Sister. Cerebrovascular Accident  Family Members In General. Diabetes Mellitus  Family Members In General. Heart Disease  Family Members In General. Heart disease in female family member before age 103  Hypertension  Brother, Mother, Sister. Migraine Headache  Daughter. Prostate Cancer  Father.  Pregnancy / Birth History  Age at menarche  38 years. Age of menopause  <43 Contraceptive History  Oral contraceptives. Gravida  3 Maternal age  61-20 Para  3  Other Problems  Anxiety Disorder  Arthritis  Back Pain  Depression  High blood pressure  Oophorectomy  Bilateral. Sleep Apnea  Umbilical Hernia Repair   Vitals   Weight: 355.2 lb Height: 64in Body Surface Area: 2.5 m Body Mass Index: 60.97 kg/m  Temp.: 96.67F  Pulse: 99 (Regular)  BP: 143/82 (Sitting, Left Arm, Standard)       Physical Exam The physical exam findings are as follows: Note:General: Alert, morbidly obese Afro-American female, in no distress Skin: Warm and dry without rash or infection. HEENT: No palpable masses or thyromegaly. Sclera nonicteric. . Lungs: Breath sounds clear and equal. No wheezing or increased work of breathing. Cardiovascular: Regular rate and rhythm without murmer. 1+ lower extremity  edema Abdomen: Nondistended. Soft and nontender. No  masses palpable. No organomegaly. No palpable hernias. Extremities: No edema or joint swelling or deformity. No chronic venous stasis changes. Neurologic: Alert and fully oriented. Gait difficult due to obesity and the stiffness. No focal weakness. Psychiatric: Normal mood and affect. Thought content appropriate with normal judgement and insight    Assessment & Plan  OBESITY, MORBID, BMI 50 OR HIGHER (E66.01) Impression: Patient with progressive morbid obesity unresponsive to multiple efforts at medical management who presents with a BMI of 60 and comorbidities of degenerative joint disease, obstructive sleep apnea, hypertension and chronic lower extremity edema. I believe there would be very significant medical benefit from surgical weight loss. After our discussion of surgical options currently available the patient has decided to proceed with laparoscopic sleeve gastrectomy due to the somewhat less invasive nature of the procedure compared to bypass and lack of severe reflux or diabetes. We have discussed the nature of the problem and the risks of remaining morbidly obese. We discussed laparoscopic sleeve gastrectomy in detail including the nature of the procedure, expected hospitalization and recovery, and major risks of anesthetic complications, bleeding, blood clots and pulmonary emboli, leakage and infection and long-term risks of stricture, , bowel obstruction, reflux, nutritional deficiencies, and failure to lose weight or weight regain. We discussed these problems could lead to death. We discussed that the procedure is not reversible. We discussed possible need for conversion to gastric bypass or other procedure. She has successfully completed her preoperative workup. We reviewed the consent form again today on her questions were answered. She is given prescriptions for Protonix and pain medication. We discussed looking for possibly repairing a small hiatal hernia.

## 2018-02-15 ENCOUNTER — Inpatient Hospital Stay (HOSPITAL_COMMUNITY): Payer: BLUE CROSS/BLUE SHIELD | Admitting: Certified Registered Nurse Anesthetist

## 2018-02-15 ENCOUNTER — Other Ambulatory Visit: Payer: Self-pay

## 2018-02-15 ENCOUNTER — Inpatient Hospital Stay (HOSPITAL_COMMUNITY)
Admission: RE | Admit: 2018-02-15 | Discharge: 2018-02-16 | DRG: 621 | Disposition: A | Discharge status: Home / Self Care | Payer: BLUE CROSS/BLUE SHIELD | Attending: General Surgery | Admitting: General Surgery

## 2018-02-15 ENCOUNTER — Encounter (HOSPITAL_COMMUNITY)
Admission: RE | Disposition: A | Discharge status: Home / Self Care | Payer: Self-pay | Source: Home / Self Care | Attending: General Surgery

## 2018-02-15 ENCOUNTER — Encounter (HOSPITAL_COMMUNITY): Payer: Self-pay | Admitting: *Deleted

## 2018-02-15 DIAGNOSIS — Z818 Family history of other mental and behavioral disorders: Secondary | ICD-10-CM

## 2018-02-15 DIAGNOSIS — I10 Essential (primary) hypertension: Secondary | ICD-10-CM | POA: Diagnosis present

## 2018-02-15 DIAGNOSIS — Z90710 Acquired absence of both cervix and uterus: Secondary | ICD-10-CM | POA: Diagnosis not present

## 2018-02-15 DIAGNOSIS — M170 Bilateral primary osteoarthritis of knee: Secondary | ICD-10-CM | POA: Diagnosis present

## 2018-02-15 DIAGNOSIS — Z79899 Other long term (current) drug therapy: Secondary | ICD-10-CM | POA: Diagnosis not present

## 2018-02-15 DIAGNOSIS — Z823 Family history of stroke: Secondary | ICD-10-CM | POA: Diagnosis not present

## 2018-02-15 DIAGNOSIS — Z8261 Family history of arthritis: Secondary | ICD-10-CM

## 2018-02-15 DIAGNOSIS — Z8249 Family history of ischemic heart disease and other diseases of the circulatory system: Secondary | ICD-10-CM | POA: Diagnosis not present

## 2018-02-15 DIAGNOSIS — Z880 Allergy status to penicillin: Secondary | ICD-10-CM

## 2018-02-15 DIAGNOSIS — Z7982 Long term (current) use of aspirin: Secondary | ICD-10-CM | POA: Diagnosis not present

## 2018-02-15 DIAGNOSIS — G4733 Obstructive sleep apnea (adult) (pediatric): Secondary | ICD-10-CM | POA: Diagnosis present

## 2018-02-15 DIAGNOSIS — Z8042 Family history of malignant neoplasm of prostate: Secondary | ICD-10-CM

## 2018-02-15 DIAGNOSIS — R011 Cardiac murmur, unspecified: Secondary | ICD-10-CM | POA: Diagnosis present

## 2018-02-15 DIAGNOSIS — Z9884 Bariatric surgery status: Secondary | ICD-10-CM

## 2018-02-15 DIAGNOSIS — Z6841 Body Mass Index (BMI) 40.0 and over, adult: Secondary | ICD-10-CM | POA: Diagnosis not present

## 2018-02-15 DIAGNOSIS — Z833 Family history of diabetes mellitus: Secondary | ICD-10-CM

## 2018-02-15 HISTORY — PX: HIATAL HERNIA REPAIR: SHX195

## 2018-02-15 HISTORY — PX: LAPAROSCOPIC GASTRIC SLEEVE RESECTION: SHX5895

## 2018-02-15 HISTORY — DX: Bariatric surgery status: Z98.84

## 2018-02-15 LAB — CBC WITH DIFFERENTIAL/PLATELET
ABS IMMATURE GRANULOCYTES: 0.02 10*3/uL (ref 0.00–0.07)
Basophils Absolute: 0.1 10*3/uL (ref 0.0–0.1)
Basophils Relative: 1 %
Eosinophils Absolute: 0.2 10*3/uL (ref 0.0–0.5)
Eosinophils Relative: 3 %
HCT: 41.7 % (ref 36.0–46.0)
Hemoglobin: 13.4 g/dL (ref 12.0–15.0)
Immature Granulocytes: 0 %
Lymphocytes Relative: 46 %
Lymphs Abs: 2.8 10*3/uL (ref 0.7–4.0)
MCH: 32.6 pg (ref 26.0–34.0)
MCHC: 32.1 g/dL (ref 30.0–36.0)
MCV: 101.5 fL — ABNORMAL HIGH (ref 80.0–100.0)
MONO ABS: 0.8 10*3/uL (ref 0.1–1.0)
Monocytes Relative: 13 %
Neutro Abs: 2.2 10*3/uL (ref 1.7–7.7)
Neutrophils Relative %: 37 %
Platelets: 230 10*3/uL (ref 150–400)
RBC: 4.11 MIL/uL (ref 3.87–5.11)
RDW: 13 % (ref 11.5–15.5)
WBC: 6 10*3/uL (ref 4.0–10.5)
nRBC: 0 % (ref 0.0–0.2)

## 2018-02-15 LAB — COMPREHENSIVE METABOLIC PANEL
ALT: 24 U/L (ref 0–44)
AST: 27 U/L (ref 15–41)
Albumin: 4 g/dL (ref 3.5–5.0)
Alkaline Phosphatase: 66 U/L (ref 38–126)
Anion gap: 12 (ref 5–15)
BUN: 28 mg/dL — ABNORMAL HIGH (ref 6–20)
CO2: 25 mmol/L (ref 22–32)
Calcium: 9.6 mg/dL (ref 8.9–10.3)
Chloride: 101 mmol/L (ref 98–111)
Creatinine, Ser: 1.14 mg/dL — ABNORMAL HIGH (ref 0.44–1.00)
GFR calc non Af Amer: 54 mL/min — ABNORMAL LOW (ref >60)
Glucose, Bld: 100 mg/dL — ABNORMAL HIGH (ref 70–99)
Potassium: 3.2 mmol/L — ABNORMAL LOW (ref 3.5–5.1)
Sodium: 138 mmol/L (ref 135–145)
Total Bilirubin: 0.8 mg/dL (ref 0.3–1.2)
Total Protein: 7.4 g/dL (ref 6.5–8.1)

## 2018-02-15 LAB — TYPE AND SCREEN
ABO/RH(D): O POS
Antibody Screen: NEGATIVE

## 2018-02-15 LAB — PREGNANCY, URINE: Preg Test, Ur: NEGATIVE

## 2018-02-15 LAB — HEMOGLOBIN AND HEMATOCRIT, BLOOD
HCT: 42.8 % (ref 36.0–46.0)
Hemoglobin: 13.5 g/dL (ref 12.0–15.0)

## 2018-02-15 SURGERY — GASTRECTOMY, SLEEVE, LAPAROSCOPIC
Anesthesia: General | Site: Abdomen

## 2018-02-15 MED ORDER — SODIUM CHLORIDE (PF) 0.9 % IJ SOLN
INTRAMUSCULAR | Status: AC
  Filled 2018-02-15: qty 10

## 2018-02-15 MED ORDER — EVICEL 5 ML EX KIT
PACK | CUTANEOUS | Status: DC | PRN
  Administered 2018-02-15: 5 mL

## 2018-02-15 MED ORDER — EPHEDRINE 5 MG/ML INJ
INTRAVENOUS | Status: AC
  Filled 2018-02-15: qty 10

## 2018-02-15 MED ORDER — PROPOFOL 10 MG/ML IV BOLUS
INTRAVENOUS | Status: DC | PRN
  Administered 2018-02-15: 200 mg via INTRAVENOUS

## 2018-02-15 MED ORDER — PHENYLEPHRINE 40 MCG/ML (10ML) SYRINGE FOR IV PUSH (FOR BLOOD PRESSURE SUPPORT)
PREFILLED_SYRINGE | INTRAVENOUS | Status: AC
  Filled 2018-02-15: qty 10

## 2018-02-15 MED ORDER — BUPIVACAINE-EPINEPHRINE 0.25% -1:200000 IJ SOLN
INTRAMUSCULAR | Status: DC | PRN
  Administered 2018-02-15: 30 mL

## 2018-02-15 MED ORDER — ROCURONIUM BROMIDE 50 MG/5ML IV SOSY
PREFILLED_SYRINGE | INTRAVENOUS | Status: DC | PRN
  Administered 2018-02-15: 60 mg via INTRAVENOUS

## 2018-02-15 MED ORDER — PHENYLEPHRINE 40 MCG/ML (10ML) SYRINGE FOR IV PUSH (FOR BLOOD PRESSURE SUPPORT)
PREFILLED_SYRINGE | INTRAVENOUS | Status: DC | PRN
  Administered 2018-02-15 (×2): 80 ug via INTRAVENOUS
  Administered 2018-02-15 (×2): 120 ug via INTRAVENOUS

## 2018-02-15 MED ORDER — KETAMINE HCL 10 MG/ML IJ SOLN
INTRAMUSCULAR | Status: DC | PRN
  Administered 2018-02-15: 30 mg via INTRAVENOUS

## 2018-02-15 MED ORDER — DEXAMETHASONE SODIUM PHOSPHATE 10 MG/ML IJ SOLN
INTRAMUSCULAR | Status: AC
  Filled 2018-02-15: qty 1

## 2018-02-15 MED ORDER — CELECOXIB 200 MG PO CAPS
400.0000 mg | ORAL_CAPSULE | ORAL | Status: AC
  Administered 2018-02-15: 400 mg via ORAL
  Filled 2018-02-15: qty 2

## 2018-02-15 MED ORDER — DEXAMETHASONE SODIUM PHOSPHATE 4 MG/ML IJ SOLN: 4.0000 mg | INTRAMUSCULAR | Status: DC

## 2018-02-15 MED ORDER — OXYCODONE HCL 5 MG/5ML PO SOLN
5.0000 mg | Freq: Once | ORAL | Status: DC | PRN
  Filled 2018-02-15: qty 5

## 2018-02-15 MED ORDER — PREMIER PROTEIN SHAKE
2.0000 [oz_av] | ORAL | Status: DC
  Administered 2018-02-16 (×4): 2 [oz_av] via ORAL

## 2018-02-15 MED ORDER — HYDRALAZINE HCL 20 MG/ML IJ SOLN: 10.0000 mg | INTRAMUSCULAR | Status: DC | PRN

## 2018-02-15 MED ORDER — LIDOCAINE 2% (20 MG/ML) 5 ML SYRINGE
INTRAMUSCULAR | Status: DC | PRN
  Administered 2018-02-15: 1.5 mg/kg/h via INTRAVENOUS

## 2018-02-15 MED ORDER — MIDAZOLAM HCL 5 MG/5ML IJ SOLN
INTRAMUSCULAR | Status: DC | PRN
  Administered 2018-02-15: 2 mg via INTRAVENOUS

## 2018-02-15 MED ORDER — ACETAMINOPHEN 500 MG PO TABS
1000.0000 mg | ORAL_TABLET | ORAL | Status: AC
  Administered 2018-02-15: 1000 mg via ORAL
  Filled 2018-02-15: qty 2

## 2018-02-15 MED ORDER — HYDROCHLOROTHIAZIDE 25 MG PO TABS
25.0000 mg | ORAL_TABLET | Freq: Every day | ORAL | Status: DC
  Administered 2018-02-15 – 2018-02-16 (×2): 25 mg via ORAL
  Filled 2018-02-15 (×2): qty 1

## 2018-02-15 MED ORDER — ONDANSETRON HCL 4 MG/2ML IJ SOLN
4.0000 mg | INTRAMUSCULAR | Status: DC | PRN
  Administered 2018-02-15 – 2018-02-16 (×2): 4 mg via INTRAVENOUS
  Filled 2018-02-15 (×2): qty 2

## 2018-02-15 MED ORDER — ACETAMINOPHEN 160 MG/5ML PO SOLN
650.0000 mg | Freq: Four times a day (QID) | ORAL | Status: DC
  Administered 2018-02-15 – 2018-02-16 (×3): 650 mg via ORAL
  Filled 2018-02-15 (×3): qty 20.3

## 2018-02-15 MED ORDER — LIDOCAINE 2% (20 MG/ML) 5 ML SYRINGE
INTRAMUSCULAR | Status: AC
  Filled 2018-02-15: qty 5

## 2018-02-15 MED ORDER — LEVOFLOXACIN IN D5W 750 MG/150ML IV SOLN
750.0000 mg | INTRAVENOUS | Status: AC
  Administered 2018-02-15: 750 mg via INTRAVENOUS
  Filled 2018-02-15: qty 150

## 2018-02-15 MED ORDER — LACTATED RINGERS IR SOLN
Status: DC | PRN
  Administered 2018-02-15: 500 mL

## 2018-02-15 MED ORDER — PROPOFOL 10 MG/ML IV BOLUS
INTRAVENOUS | Status: AC
  Filled 2018-02-15: qty 20

## 2018-02-15 MED ORDER — GABAPENTIN 100 MG PO CAPS
200.0000 mg | ORAL_CAPSULE | Freq: Two times a day (BID) | ORAL | Status: DC
  Administered 2018-02-15 – 2018-02-16 (×2): 200 mg via ORAL
  Filled 2018-02-15 (×2): qty 2

## 2018-02-15 MED ORDER — ONDANSETRON HCL 4 MG/2ML IJ SOLN
INTRAMUSCULAR | Status: DC | PRN
  Administered 2018-02-15: 4 mg via INTRAVENOUS

## 2018-02-15 MED ORDER — ONDANSETRON HCL 4 MG/2ML IJ SOLN: 4.0000 mg | Freq: Four times a day (QID) | INTRAMUSCULAR | Status: DC | PRN

## 2018-02-15 MED ORDER — SUCCINYLCHOLINE CHLORIDE 200 MG/10ML IV SOSY
PREFILLED_SYRINGE | INTRAVENOUS | Status: AC
  Filled 2018-02-15: qty 10

## 2018-02-15 MED ORDER — SUGAMMADEX SODIUM 200 MG/2ML IV SOLN
INTRAVENOUS | Status: DC | PRN
  Administered 2018-02-15: 400 mg via INTRAVENOUS

## 2018-02-15 MED ORDER — CHLORHEXIDINE GLUCONATE CLOTH 2 % EX PADS: 6.0000 | MEDICATED_PAD | Freq: Once | CUTANEOUS | Status: DC

## 2018-02-15 MED ORDER — FENTANYL CITRATE (PF) 250 MCG/5ML IJ SOLN
INTRAMUSCULAR | Status: AC
  Filled 2018-02-15: qty 5

## 2018-02-15 MED ORDER — PHENYLEPHRINE HCL 10 MG/ML IJ SOLN
INTRAMUSCULAR | Status: AC
  Filled 2018-02-15: qty 1

## 2018-02-15 MED ORDER — LACTATED RINGERS IV SOLN
INTRAVENOUS | Status: DC
  Administered 2018-02-15 (×2): via INTRAVENOUS

## 2018-02-15 MED ORDER — DEXAMETHASONE SODIUM PHOSPHATE 10 MG/ML IJ SOLN
INTRAMUSCULAR | Status: DC | PRN
  Administered 2018-02-15: 5 mg via INTRAVENOUS

## 2018-02-15 MED ORDER — PHENYLEPHRINE HCL 10 MG/ML IJ SOLN
INTRAVENOUS | Status: DC | PRN
  Administered 2018-02-15: 35 ug/min via INTRAVENOUS

## 2018-02-15 MED ORDER — FENTANYL CITRATE (PF) 100 MCG/2ML IJ SOLN
INTRAMUSCULAR | Status: AC
  Filled 2018-02-15: qty 2

## 2018-02-15 MED ORDER — SCOPOLAMINE 1 MG/3DAYS TD PT72
1.0000 | MEDICATED_PATCH | TRANSDERMAL | Status: DC
  Administered 2018-02-15: 1.5 mg via TRANSDERMAL
  Filled 2018-02-15: qty 1

## 2018-02-15 MED ORDER — LISINOPRIL 20 MG PO TABS
20.0000 mg | ORAL_TABLET | Freq: Every day | ORAL | Status: DC
  Administered 2018-02-15 – 2018-02-16 (×2): 20 mg via ORAL
  Filled 2018-02-15 (×2): qty 1

## 2018-02-15 MED ORDER — ONDANSETRON HCL 4 MG/2ML IJ SOLN
INTRAMUSCULAR | Status: AC
  Filled 2018-02-15: qty 2

## 2018-02-15 MED ORDER — POTASSIUM CHLORIDE IN NACL 20-0.45 MEQ/L-% IV SOLN
INTRAVENOUS | Status: DC
  Administered 2018-02-15 – 2018-02-16 (×2): via INTRAVENOUS
  Filled 2018-02-15 (×4): qty 1000

## 2018-02-15 MED ORDER — FENTANYL CITRATE (PF) 250 MCG/5ML IJ SOLN
INTRAMUSCULAR | Status: DC | PRN
  Administered 2018-02-15 (×3): 50 ug via INTRAVENOUS

## 2018-02-15 MED ORDER — SODIUM CHLORIDE (PF) 0.9 % IJ SOLN
INTRAMUSCULAR | Status: DC | PRN
  Administered 2018-02-15: 50 mL via INTRAVENOUS

## 2018-02-15 MED ORDER — BUPIVACAINE-EPINEPHRINE (PF) 0.25% -1:200000 IJ SOLN
INTRAMUSCULAR | Status: AC
  Filled 2018-02-15: qty 30

## 2018-02-15 MED ORDER — LIDOCAINE 2% (20 MG/ML) 5 ML SYRINGE
INTRAMUSCULAR | Status: DC | PRN
  Administered 2018-02-15: 100 mg via INTRAVENOUS

## 2018-02-15 MED ORDER — EPHEDRINE SULFATE-NACL 50-0.9 MG/10ML-% IV SOSY
PREFILLED_SYRINGE | INTRAVENOUS | Status: DC | PRN
  Administered 2018-02-15 (×7): 10 mg via INTRAVENOUS

## 2018-02-15 MED ORDER — GABAPENTIN 300 MG PO CAPS
300.0000 mg | ORAL_CAPSULE | ORAL | Status: AC
  Administered 2018-02-15: 300 mg via ORAL
  Filled 2018-02-15: qty 1

## 2018-02-15 MED ORDER — LIDOCAINE HCL 2 % IJ SOLN
INTRAMUSCULAR | Status: AC
  Filled 2018-02-15: qty 20

## 2018-02-15 MED ORDER — SUCCINYLCHOLINE CHLORIDE 200 MG/10ML IV SOSY
PREFILLED_SYRINGE | INTRAVENOUS | Status: DC | PRN
  Administered 2018-02-15: 120 mg via INTRAVENOUS

## 2018-02-15 MED ORDER — MORPHINE SULFATE (PF) 2 MG/ML IV SOLN: 1.0000 mg | INTRAVENOUS | Status: DC | PRN

## 2018-02-15 MED ORDER — OXYCODONE HCL 5 MG/5ML PO SOLN
5.0000 mg | ORAL | Status: DC | PRN
  Administered 2018-02-15: 5 mg via ORAL
  Filled 2018-02-15 (×2): qty 5

## 2018-02-15 MED ORDER — MIDAZOLAM HCL 2 MG/2ML IJ SOLN
INTRAMUSCULAR | Status: AC
  Filled 2018-02-15: qty 2

## 2018-02-15 MED ORDER — HEPARIN SODIUM (PORCINE) 5000 UNIT/ML IJ SOLN
5000.0000 [IU] | INTRAMUSCULAR | Status: AC
  Administered 2018-02-15: 5000 [IU] via SUBCUTANEOUS
  Filled 2018-02-15: qty 1

## 2018-02-15 MED ORDER — FENTANYL CITRATE (PF) 100 MCG/2ML IJ SOLN
25.0000 ug | INTRAMUSCULAR | Status: DC | PRN
  Administered 2018-02-15 (×2): 50 ug via INTRAVENOUS

## 2018-02-15 MED ORDER — ROCURONIUM BROMIDE 10 MG/ML (PF) SYRINGE
PREFILLED_SYRINGE | INTRAVENOUS | Status: AC
  Filled 2018-02-15: qty 10

## 2018-02-15 MED ORDER — LISINOPRIL-HYDROCHLOROTHIAZIDE 20-25 MG PO TABS: 1.0000 | ORAL_TABLET | Freq: Every day | ORAL | Status: DC

## 2018-02-15 MED ORDER — OXYCODONE HCL 5 MG PO TABS: 5.0000 mg | ORAL_TABLET | Freq: Once | ORAL | Status: DC | PRN

## 2018-02-15 MED ORDER — VENLAFAXINE HCL ER 150 MG PO CP24
150.0000 mg | ORAL_CAPSULE | Freq: Every day | ORAL | Status: DC
  Administered 2018-02-16: 150 mg via ORAL
  Filled 2018-02-15: qty 1

## 2018-02-15 MED ORDER — EVICEL 5 ML EX KIT
PACK | Freq: Once | CUTANEOUS | Status: DC
  Filled 2018-02-15: qty 2

## 2018-02-15 MED ORDER — PANTOPRAZOLE SODIUM 40 MG IV SOLR
40.0000 mg | Freq: Every day | INTRAVENOUS | Status: DC
  Administered 2018-02-15: 40 mg via INTRAVENOUS
  Filled 2018-02-15: qty 40

## 2018-02-15 MED ORDER — ENOXAPARIN SODIUM 30 MG/0.3ML ~~LOC~~ SOLN
30.0000 mg | Freq: Two times a day (BID) | SUBCUTANEOUS | Status: DC
  Administered 2018-02-15 – 2018-02-16 (×2): 30 mg via SUBCUTANEOUS
  Filled 2018-02-15 (×3): qty 0.3

## 2018-02-15 MED ORDER — APREPITANT 40 MG PO CAPS
40.0000 mg | ORAL_CAPSULE | ORAL | Status: AC
  Administered 2018-02-15: 40 mg via ORAL
  Filled 2018-02-15: qty 1

## 2018-02-15 MED ORDER — SODIUM CHLORIDE (PF) 0.9 % IJ SOLN
INTRAMUSCULAR | Status: AC
  Filled 2018-02-15: qty 50

## 2018-02-15 MED ORDER — BUPIVACAINE LIPOSOME 1.3 % IJ SUSP
INTRAMUSCULAR | Status: DC | PRN
  Administered 2018-02-15: 20 mL

## 2018-02-15 SURGICAL SUPPLY — 66 items
ADH SKN CLS APL DERMABOND .7 (GAUZE/BANDAGES/DRESSINGS) ×1
APL SWBSTK 6 STRL LF DISP (MISCELLANEOUS)
APPLICATOR COTTON TIP 6 STRL (MISCELLANEOUS) IMPLANT
APPLICATOR COTTON TIP 6IN STRL (MISCELLANEOUS)
APPLIER CLIP ROT 10 11.4 M/L (STAPLE)
APPLIER CLIP ROT 13.4 12 LRG (CLIP)
APR CLP LRG 13.4X12 ROT 20 MLT (CLIP)
APR CLP MED LRG 11.4X10 (STAPLE)
BLADE SURG SZ11 CARB STEEL (BLADE) ×2 IMPLANT
CABLE HIGH FREQUENCY MONO STRZ (ELECTRODE) ×2 IMPLANT
CHLORAPREP W/TINT 26ML (MISCELLANEOUS) ×3 IMPLANT
CLIP APPLIE ROT 10 11.4 M/L (STAPLE) IMPLANT
CLIP APPLIE ROT 13.4 12 LRG (CLIP) IMPLANT
COVER WAND RF STERILE (DRAPES) ×1 IMPLANT
DERMABOND ADVANCED (GAUZE/BANDAGES/DRESSINGS) ×1
DERMABOND ADVANCED .7 DNX12 (GAUZE/BANDAGES/DRESSINGS) ×1 IMPLANT
DEVICE SUT QUICK LOAD TK 5 (STAPLE) ×1 IMPLANT
DEVICE SUT TI-KNOT TK 5X26 (MISCELLANEOUS) ×1 IMPLANT
DEVICE SUTURE ENDOST 10MM (ENDOMECHANICALS) ×1 IMPLANT
DRAPE UTILITY XL STRL (DRAPES) ×4 IMPLANT
ELECT PENCIL ROCKER SW 15FT (MISCELLANEOUS) ×1 IMPLANT
ELECT REM PT RETURN 15FT ADLT (MISCELLANEOUS) ×2 IMPLANT
GAUZE SPONGE 4X4 12PLY STRL (GAUZE/BANDAGES/DRESSINGS) IMPLANT
GLOVE BIOGEL PI IND STRL 7.5 (GLOVE) ×1 IMPLANT
GLOVE BIOGEL PI INDICATOR 7.5 (GLOVE) ×1
GLOVE ECLIPSE 7.5 STRL STRAW (GLOVE) ×2 IMPLANT
GOWN STRL REUS W/TWL LRG LVL3 (GOWN DISPOSABLE) ×3 IMPLANT
GOWN STRL REUS W/TWL XL LVL3 (GOWN DISPOSABLE) ×5 IMPLANT
GRASPER SUT TROCAR 14GX15 (MISCELLANEOUS) IMPLANT
HOVERMATT SINGLE USE (MISCELLANEOUS) ×2 IMPLANT
KIT BASIN OR (CUSTOM PROCEDURE TRAY) ×2 IMPLANT
MARKER SKIN DUAL TIP RULER LAB (MISCELLANEOUS) ×2 IMPLANT
NDL SPNL 22GX3.5 QUINCKE BK (NEEDLE) ×1 IMPLANT
NEEDLE SPNL 22GX3.5 QUINCKE BK (NEEDLE) ×2 IMPLANT
PACK UNIVERSAL I (CUSTOM PROCEDURE TRAY) ×2 IMPLANT
RELOAD STAPLE 60 3.6 BLU REG (STAPLE) ×1 IMPLANT
RELOAD STAPLE 60 3.8 GOLD REG (STAPLE) ×1 IMPLANT
RELOAD STAPLE 60 4.1 GRN THCK (STAPLE) ×1 IMPLANT
RELOAD STAPLER BLUE 60MM (STAPLE) ×4 IMPLANT
RELOAD STAPLER GOLD 60MM (STAPLE) ×2 IMPLANT
RELOAD STAPLER GREEN 60MM (STAPLE) ×2 IMPLANT
SCISSORS LAP 5X45 EPIX DISP (ENDOMECHANICALS) ×2 IMPLANT
SET IRRIG TUBING LAPAROSCOPIC (IRRIGATION / IRRIGATOR) ×2 IMPLANT
SHEARS HARMONIC ACE PLUS 45CM (MISCELLANEOUS) ×2 IMPLANT
SLEEVE ADV FIXATION 5X100MM (TROCAR) ×2 IMPLANT
SLEEVE GASTRECTOMY 36FR VISIGI (MISCELLANEOUS) ×2 IMPLANT
SOLUTION ANTI FOG 6CC (MISCELLANEOUS) ×2 IMPLANT
SPONGE LAP 18X18 RF (DISPOSABLE) ×2 IMPLANT
STAPLER ECHELON LONG 60 440 (INSTRUMENTS) ×2 IMPLANT
STAPLER RELOAD BLUE 60MM (STAPLE) ×8
STAPLER RELOAD GOLD 60MM (STAPLE) ×4
STAPLER RELOAD GREEN 60MM (STAPLE) ×4
SUT MNCRL AB 4-0 PS2 18 (SUTURE) ×3 IMPLANT
SUT SURGIDAC NAB ES-9 0 48 120 (SUTURE) ×1 IMPLANT
SUT VIC AB 0 BRD 54 (SUTURE) IMPLANT
SYR 10ML ECCENTRIC (SYRINGE) ×2 IMPLANT
SYR 20CC LL (SYRINGE) ×4 IMPLANT
SYSTEM WECK SHIELD CLOSURE (TROCAR) IMPLANT
TIP RIGID 35CM EVICEL (HEMOSTASIS) ×2 IMPLANT
TOWEL OR 17X26 10 PK STRL BLUE (TOWEL DISPOSABLE) ×2 IMPLANT
TOWEL OR NON WOVEN STRL DISP B (DISPOSABLE) ×2 IMPLANT
TROCAR ADV FIXATION 5X100MM (TROCAR) ×2 IMPLANT
TROCAR BLADELESS 15MM (ENDOMECHANICALS) ×2 IMPLANT
TROCAR BLADELESS OPT 5 100 (ENDOMECHANICALS) ×2 IMPLANT
TUBING CONNECTING 10 (TUBING) ×2 IMPLANT
TUBING INSUF HEATED (TUBING) ×2 IMPLANT

## 2018-02-15 NOTE — Op Note (Signed)
Preoperative diagnosis: laparoscopic sleeve gastrectomy  Postoperative diagnosis: Same   Procedure: Upper endoscopy   Surgeon: Clovis Riley, M.D.  Anesthesia: Gen.   Description of procedure: The endoscopy was placed in the mouth and into the oropharynx and under endoscopic vision it was advanced to the esophagogastric junction.  The pouch was insufflated and no bleeding or bubbles were seen.  The GEJ was identified at 36cm from the teeth. The lumen was free of any undue narrowing or angulation specifically at the incisura angularis. No bleeding or leaks were detected. The scope was withdrawn without difficulty.    Clovis Riley, M.D. General, Bariatric, & Minimally Invasive Surgery [facility]

## 2018-02-15 NOTE — Op Note (Signed)
Preoperative Diagnosis: MORBID OBESITY  Postoprative Diagnosis: MORBID OBESITY  Procedure: Procedure(s): LAPAROSCOPIC GASTRIC SLEEVE RESECTION WITH UPPER ENDO AND ERAS PATHWAY HERNIA REPAIR HIATAL   Surgeon: Excell Seltzer T   Assistants: Romana Juniper  Anesthesia:  General endotracheal anesthesia  Indications: Patient with progressive morbid obesity unresponsive to multiple efforts at medical management who presents with a BMI of 60 and comorbidities of degenerative joint disease, obstructive sleep apnea, hypertension and chronic lower extremity edema.  After extensive preoperative work-up and discussion detailed elsewhere we have elected to proceed with laparoscopic sleeve gastrectomy for treatment of her morbid obesity.    Procedure Detail: Patient was brought to the operating room, placed in the supine position on the operating table, and general endotracheal anesthesia induced.  She received subcutaneous heparin and preoperative IV antibiotics.  PAS were in place.  The abdomen was widely sterilely prepped and draped.  Patient timeout was performed and correct procedure verified.  Access was obtained with a 5 mm Optiview trocar in the left upper quadrant without difficulty and pneumoperitoneum established.  There was no evidence of trocar injury.  There were noted to be adhesions to the falciform ligament and midline with history of previous robotic hysterectomy and umbilical hernia repair.  Under direct vision I placed a second 5 mm trocar laterally in the left upper quadrant.  Through this using the harmonic scalpel the omental adhesions were completely taken down as low as the umbilicus.  This allowed very good exposure of the upper abdomen.  Under direct vision a 5 mm port was placed in the right upper quadrant and a 15 mm port at the base of the falciform ligament.  Under direct vision the Nathanson retractor was placed through a subxiphoid site in the left lobe the liver elevated  with excellent exposure of the hiatus and entire stomach.  Finally a 5 mm trocar was placed just above into the left of the umbilicus for the camera port.  The patient had had an upper GI series indicating a small hiatal hernia and some reflux although no clinical GERD.  Examination of the hiatus did reveal a small but definite hiatal hernia.  We initially began the gastric dissection at the mid greater curve dividing the vasculature with the harmonic scalpel and entering the lesser sac.  The dissection progressed proximally along the greater curve.  Short gastric vessels were individually divided with harmonic scalpel.  The fundus was fully mobilized and all short gastric vessels divided.  The left crus was dissected and exposed and the esophageal fat pad mobilized. We then proceeded with a hiatal hernia repair.  The gastrohepatic omentum was divided along an avascular area and the dissection carried up over the EG junction.  The right crus was exposed and section begun just anterior to this into the retroesophageal space.  With careful blunt dissection we entered a moderate-sized hiatal hernia.  A retroesophageal fat pad was reduced and I excised this with the harmonic scalpel.  The left crus was identified and dissected.  I then did a repair with 1 0 Vicryl suture posteriorly.  This closed the hiatus down to an appropriate normal size and was felt to be sufficient.  We then continued the gastric dissection distally along the greater curve until the stomach was fully mobilized to a point 5 to 6 cm from the pylorus and it was free on its lesser curve vasculature with no posterior adhesions.  The 60 French VISI G was passed orally and advanced to the pylorus.  It was placed on continuous suction along the lesser curve with the stomach splayed out symmetrically.  The sleeve was begun with an initial firing of the green load 60 mm stapler being careful to angle away from the incisura.  Next a gold load 60 mm stapler  was used to carry the staple line up and just past the incisura allowing a little extra room adjacent to the tube at this point.  We then completed the sleeve with 3 further firings of the 60 mm blue load stapler staying closer to the tube and with the final firing angling out slightly just lateral to the esophageal fat pad at right angles to the gastric wall.  The sleeve was insufflated with air and was symmetric and nicely tapering with no narrowing and no evidence of leak under saline irrigation.  The VISI G-tube was removed.  Dr. Windle Guard performed upper endoscopy showing no bleeding or leak or narrowing.  The staple line was coated extensively with Evaseal.  The gastric specimen was removed through the 15 mm trocar site after dilating it slightly.  The fascial defect here was closed with 0 Vicryl using the Weck closure device.  Trocar sites were further infiltrated with local anesthesia.  The Nathanson retractor was removed under direct vision, all CO2 evacuated and trochars removed.  Incisions were closed with subcuticular 4-0 Monocryl.  Sponge needle and instrument counts were correct.    Findings: Small hiatal hernia  Estimated Blood Loss:  less than 50 mL         Drains: None  Blood Given: none          Specimens: Greater curvature of stomach        Complications:  * No complications entered in OR log *         Disposition: PACU - hemodynamically stable.         Condition: stable

## 2018-02-15 NOTE — Transfer of Care (Signed)
Immediate Anesthesia Transfer of Care Note  Patient: Hailey Vance  Procedure(s) Performed: LAPAROSCOPIC GASTRIC SLEEVE RESECTION WITH UPPER ENDO AND ERAS PATHWAY (N/A Abdomen) HERNIA REPAIR HIATAL (N/A Abdomen)  Patient Location: PACU  Anesthesia Type:General  Level of Consciousness: awake, alert  and oriented  Airway & Oxygen Therapy: Patient Spontanous Breathing and Patient connected to face mask oxygen  Post-op Assessment: Report given to RN and Post -op Vital signs reviewed and stable  Post vital signs: Reviewed and stable  Last Vitals:  Vitals Value Taken Time  BP 145/107 02/15/2018  2:11 PM  Temp    Pulse 93 02/15/2018  2:13 PM  Resp 15 02/15/2018  2:13 PM  SpO2 100 % 02/15/2018  2:13 PM  Vitals shown include unvalidated device data.  Last Pain:  Vitals:   02/15/18 1139  TempSrc:   PainSc: 0-No pain         Complications: No apparent anesthesia complications

## 2018-02-15 NOTE — Interval H&P Note (Signed)
History and Physical Interval Note:  02/15/2018 11:18 AM  Hailey Vance  has presented today for surgery, with the diagnosis of MORBID OBESITY  The various methods of treatment have been discussed with the patient and family. After consideration of risks, benefits and other options for treatment, the patient has consented to  Procedure(s): LAPAROSCOPIC GASTRIC SLEEVE RESECTION WITH UPPER ENDO AND ERAS PATHWAY (N/A) as a surgical intervention .  The patient's history has been reviewed, patient examined, no change in status, stable for surgery.  I have reviewed the patient's chart and labs.  Questions were answered to the patient's satisfaction.     Darene Lamer Jerald Hennington

## 2018-02-15 NOTE — Progress Notes (Signed)
PHARMACY CONSULT FOR:  Risk Assessment for Post-Discharge VTE Following Bariatric Surgery  Post-Discharge VTE Risk Assessment: This patient's probability of 30-day post-discharge VTE is increased due to the factors marked:   Female    Age >/=60 years  x  BMI >/=50 kg/m2    CHF    Dyspnea at Rest    Paraplegia   x Non-gastric-band surgery    Operation Time >/=3 hr    Return to OR     Length of Stay >/= 3 d   Predicted probability of 30-day post-discharge VTE: 0.27%  Other patient-specific factors to consider: -  No listed PMH of DVT/PE    Recommendation for Discharge: No pharmacologic prophylaxis post-discharge      Cailen Texeira is a 52 y.o. female who underwent  LAPAROSCOPIC GASTRIC SLEEVE RESECTION WITH UPPER ENDO AND ERAS PATHWAY HERNIA REPAIR HIATAL on 02/15/2018    Case start:1230 Case end: 1348   Allergies  Allergen Reactions  . Penicillins Itching and Other (See Comments)    Has patient had a PCN reaction causing immediate rash, facial/tongue/throat swelling, SOB or lightheadedness with hypotension: No Has patient had a PCN reaction causing severe rash involving mucus membranes or skin necrosis: No Has patient had a PCN reaction that required hospitalization No Has patient had a PCN reaction occurring within the last 10 years: No If all of the above answers are "NO", then may proceed with Cephalosporin use.     Patient Measurements:   There is no height or weight on file to calculate BMI.  Recent Labs    02/15/18 1136  WBC 6.0  HGB 13.4  HCT 41.7  PLT 230  CREATININE 1.14*  ALBUMIN 4.0  PROT 7.4  AST 27  ALT 24  ALKPHOS 66  BILITOT 0.8   Estimated Creatinine Clearance: 84.4 mL/min (A) (by C-G formula based on SCr of 1.14 mg/dL (H)).    Past Medical History:  Diagnosis Date  . Anxiety   . Arthritis    osteoarthritis - knees  . Blood transfusion 1990   In [place], [place] with C/S Surgery - 5 units transfused  . Blood transfusion without  reported diagnosis 1990   after a c/section  . Chest pain   . Chronic back pain   . Depression   . Fibroid   . Headache(784.0)    otc med prn  . Heart murmur   . Hypertension   . Low blood potassium   . Plantar fasciitis of left foot   . Sleep apnea    CPAP  dont hve cpap machine yet  . Vitamin D deficiency 09/06/2014     Medications Prior to Admission  Medication Sig Dispense Refill Last Dose  . aspirin-acetaminophen-caffeine (EXCEDRIN MIGRAINE) 250-250-65 MG per tablet Take 2 tablets by mouth every 6 (six) hours as needed for headache or migraine.   Past Month at Unknown time  . Cholecalciferol (VITAMIN D3) 50 MCG (2000 UT) CHEW Chew 8,000 Units by mouth daily.   02/14/2018 at Unknown time  . lisinopril-hydrochlorothiazide (PRINZIDE,ZESTORETIC) 20-25 MG tablet TAKE one TABLET BY MOUTH ONCE DAILY (Patient taking differently: Take 1 tablet by mouth daily. ) 30 tablet 5 02/14/2018 at Unknown time  . meloxicam (MOBIC) 7.5 MG tablet Take 1 tablet (7.5 mg total) by mouth daily. As needed for pain (Patient taking differently: Take 7.5 mg by mouth daily as needed for pain. ) 90 tablet 1 Past Week at Unknown time  . Menthol, Topical Analgesic, (BIOFREEZE) 4 % GEL Apply 1 application  topically daily.     . multivitamin-iron-minerals-folic acid (CENTRUM) chewable tablet Chew 1 tablet by mouth daily.   02/14/2018 at Unknown time  . venlafaxine XR (EFFEXOR-XR) 150 MG 24 hr capsule Take 150 mg by mouth daily with breakfast.   02/15/2018 at 0730  . NYSTATIN powder APPLY  POWDER TOPICALLY TO AFFECTED AREA TWICE DAILY AS NEEDED (Patient not taking: Reported on 02/08/2018) 45 g 1 Not Taking at Unknown time  . venlafaxine (EFFEXOR) 75 MG tablet Take 1 tablet (75 mg total) by mouth 2 (two) times daily. (Patient not taking: Reported on 02/08/2018) 60 tablet 3 Not Taking at Unknown time       Royetta Asal, PharmD, BCPS Pager [phone] 02/15/2018 2:46 PM

## 2018-02-15 NOTE — Anesthesia Procedure Notes (Signed)
Procedure Name: Intubation Date/Time: 02/15/2018 12:16 PM Performed by: Maxwell Caul, CRNA Pre-anesthesia Checklist: Patient identified, Emergency Drugs available, Suction available and Patient being monitored Patient Re-evaluated:Patient Re-evaluated prior to induction Oxygen Delivery Method: Circle system utilized Preoxygenation: Pre-oxygenation with 100% oxygen Induction Type: IV induction Ventilation: Mask ventilation without difficulty Laryngoscope Size: Mac and 4 Grade View: Grade I Tube type: Oral Tube size: 7.5 mm Number of attempts: 1 Airway Equipment and Method: Stylet and Oral airway Placement Confirmation: ETT inserted through vocal cords under direct vision,  positive ETCO2 and breath sounds checked- equal and bilateral Secured at: 21 cm Tube secured with: Tape Dental Injury: Teeth and Oropharynx as per pre-operative assessment

## 2018-02-15 NOTE — Anesthesia Preprocedure Evaluation (Signed)
Anesthesia Evaluation  Patient identified by MRN, date of birth, ID band Patient awake    Reviewed: Allergy & Precautions, H&P , NPO status , Patient's Chart, lab work & pertinent test results  Airway Mallampati: II   Neck ROM: full    Dental   Pulmonary sleep apnea ,    breath sounds clear to auscultation       Cardiovascular hypertension,  Rhythm:regular Rate:Normal     Neuro/Psych  Headaches, PSYCHIATRIC DISORDERS Anxiety Depression    GI/Hepatic   Endo/Other  Morbid obesity  Renal/GU      Musculoskeletal  (+) Arthritis ,   Abdominal   Peds  Hematology   Anesthesia Other Findings   Reproductive/Obstetrics                             Anesthesia Physical Anesthesia Plan  ASA: II  Anesthesia Plan: General   Post-op Pain Management:    Induction: Intravenous  PONV Risk Score and Plan: 3 and Ondansetron, Dexamethasone, Midazolam and Treatment may vary due to age or medical condition  Airway Management Planned: Oral ETT  Additional Equipment:   Intra-op Plan:   Post-operative Plan: Extubation in OR  Informed Consent: I have reviewed the patients History and Physical, chart, labs and discussed the procedure including the risks, benefits and alternatives for the proposed anesthesia with the patient or authorized representative who has indicated his/her understanding and acceptance.     Plan Discussed with: CRNA, Anesthesiologist and Surgeon  Anesthesia Plan Comments:         Anesthesia Quick Evaluation

## 2018-02-15 NOTE — Discharge Instructions (Signed)
° ° ° °GASTRIC BYPASS/SLEEVE ° Home Care Instructions ° ° These instructions are to help you care for yourself when you go home. ° °Call: If you have any problems. °• Call [phone] and ask for the surgeon on call °• If you need immediate help, come to the ER at [facility].  °• Tell the ER staff that you are a new post-op gastric bypass or gastric sleeve patient °  °Signs and symptoms to report: • Severe vomiting or nausea °o If you cannot keep down clear liquids for longer than 1 day, call your surgeon  °• Abdominal pain that does not get better after taking your pain medication °• Fever over 100.4° F with chills °• Heart beating over 100 beats a minute °• Shortness of breath at rest °• Chest pain °•  Redness, swelling, drainage, or foul odor at incision (surgical) sites °•  If your incisions open or pull apart °• Swelling or pain in calf (lower leg) °• Diarrhea (Loose bowel movements that happen often), frequent watery, uncontrolled bowel movements °• Constipation, (no bowel movements for 3 days) if this happens: Pick one °o Milk of Magnesia, 2 tablespoons by mouth, 3 times a day for 2 days if needed °o Stop taking Milk of Magnesia once you have a bowel movement °o Call your doctor if constipation continues °Or °o Miralax  (instead of Milk of Magnesia) following the label instructions °o Stop taking Miralax once you have a bowel movement °o Call your doctor if constipation continues °• Anything you think is not normal °  °Normal side effects after surgery: • Unable to sleep at night or unable to focus °• Irritability or moody °• Being tearful (crying) or depressed °These are common complaints, possibly related to your anesthesia medications that put you to sleep, stress of surgery, and change in lifestyle.  This usually goes away a few weeks after surgery.  If these feelings continue, call your primary care doctor. °  °Wound Care: You may have surgical glue, steri-strips, or staples over your incisions after  surgery °• Surgical glue:  Looks like a clear film over your incisions and will wear off a little at a time °• Steri-strips: Strips of tape over your incisions. You may notice a yellowish color on the skin under the steri-strips. This is used to make the   steri-strips stick better. Do not pull the steri-strips off - let them fall off °• Staples: Staples may be removed before you leave the hospital °o If you go home with staples, call [facility], [phone] at for an appointment with your surgeon’s nurse to have staples removed 10 days after surgery. °• Showering: You may shower two (2) days after your surgery unless your surgeon tells you differently °o Wash gently around incisions with warm soapy water, rinse well, and gently pat dry  °o No tub baths until staples are removed, steri-strips fall off or glue is gone.  °  °Medications: • Medications should be liquid or crushed if larger than the size of a dime °• Extended release pills (medication that release a little bit at a time through the day) should NOT be crushed or cut. (examples include XL, ER, DR, SR) °• Depending on the size and number of medications you take, you may need to space (take a few throughout the day)/change the time you take your medications so that you do not over-fill your pouch (smaller stomach) °• Make sure you follow-up with your primary care doctor to   make medication changes needed during rapid weight loss and life-style changes °• If you have diabetes, follow up with the doctor that orders your diabetes medication(s) within one week after surgery and check your blood sugar regularly. °• Do not drive while taking prescription pain medication  °• It is ok to take Tylenol by the bottle instructions with your pain medicine or instead of your pain medicine as needed.  DO NOT TAKE NSAIDS (EXAMPLES OF NSAIDS:  IBUPROFREN/ NAPROXEN)  °Diet:                    First 2 Weeks ° You will see the dietician t about two (2) weeks  after your surgery. The dietician will increase the types of foods you can eat if you are handling liquids well: °• If you have severe vomiting or nausea and cannot keep down clear liquids lasting longer than 1 day, call your surgeon @ ([phone]) °Protein Shake °• Drink at least 2 ounces of shake 5-6 times per day °• Each serving of protein shakes (usually 8 - 12 ounces) should have: °o 15 grams of protein  °o And no more than 5 grams of carbohydrate  °• Goal for protein each day: °o Men = 80 grams per day °o Women = 60 grams per day °• Protein powder may be added to fluids such as non-fat milk or Lactaid milk or unsweetened Soy/Almond milk (limit to 35 grams added protein powder per serving) ° °Hydration °• Slowly increase the amount of water and other clear liquids as tolerated (See Acceptable Fluids) °• Slowly increase the amount of protein shake as tolerated  °•  Sip fluids slowly and throughout the day.  Do not use straws. °• May use sugar substitutes in small amounts (no more than 6 - 8 packets per day; i.e. Splenda) ° °Fluid Goal °• The first goal is to drink at least 8 ounces of protein shake/drink per day (or as directed by the nutritionist); some examples of protein shakes are Syntrax Nectar, Adkins Advantage, EAS Edge HP, and Unjury. See handout from pre-op Bariatric Education Class: °o Slowly increase the amount of protein shake you drink as tolerated °o You may find it easier to slowly sip shakes throughout the day °o It is important to get your proteins in first °• Your fluid goal is to drink 64 - 100 ounces of fluid daily °o It may take a few weeks to build up to this °• 32 oz (or more) should be clear liquids  °And  °• 32 oz (or more) should be full liquids (see below for examples) °• Liquids should not contain sugar, caffeine, or carbonation ° °Clear Liquids: °• Water or Sugar-free flavored water (i.e. Fruit H2O, Propel) °• Decaffeinated coffee or tea (sugar-free) °• Crystal Lite, Wyler’s Lite,  Minute Maid Lite °• Sugar-free Jell-O °• Bouillon or broth °• Sugar-free Popsicle:   *Less than 20 calories each; Limit 1 per day ° °Full Liquids: °Protein Shakes/Drinks + 2 choices per day of other full liquids °• Full liquids must be: °o No More Than 15 grams of Carbs per serving  °o No More Than 3 grams of Fat per serving °• Strained low-fat cream soup (except Cream of Potato or Tomato) °• Non-Fat milk °• Fat-free Lactaid Milk °• Unsweetened Soy Or Unsweetened Almond Milk °• Low Sugar yogurt (Dannon Lite & Fit, Greek yogurt; Oikos Triple Zero; Chobani Simply 100; Yoplait 100 calorie Greek - No Fruit on the Bottom) ° °  °Vitamins   and Minerals • Start 1 day after surgery unless otherwise directed by your surgeon °• 2 Chewable Bariatric Specific Multivitamin / Multimineral Supplement with iron (Example: Bariatric Advantage Multi EA) °• Chewable Calcium with Vitamin D-3 °(Example: 3 Chewable Calcium Plus 600 with Vitamin D-3) °o Take 500 mg three (3) times a day for a total of 1500 mg each day °o Do not take all 3 doses of calcium at one time as it may cause constipation, and you can only absorb 500 mg  at a time  °o Do not mix multivitamins containing iron with calcium supplements; take 2 hours apart °• Menstruating women and those with a history of anemia (a blood disease that causes weakness) may need extra iron °o Talk with your doctor to see if you need more iron °• Do not stop taking or change any vitamins or minerals until you talk to your dietitian or surgeon °• Your Dietitian and/or surgeon must approve all vitamin and mineral supplements °  °Activity and Exercise: Limit your physical activity as instructed by your doctor.  It is important to continue walking at home.  During this time, use these guidelines: °• Do not lift anything greater than ten (10) pounds for at least two (2) weeks °• Do not go back to work or drive until your surgeon says you can °• You may have sex when you feel comfortable  °o It is  VERY important for female patients to use a reliable birth control method; fertility often increases after surgery  °o All hormonal birth control will be ineffective for 30 days after surgery due to medications given during surgery a barrier method must be used. °o Do not get pregnant for at least 18 months °• Start exercising as soon as your doctor tells you that you can °o Make sure your doctor approves any physical activity °• Start with a simple walking program °• Walk 5-15 minutes each day, 7 days per week.  °• Slowly increase until you are walking 30-45 minutes per day °Consider joining our BELT program. [phone] or email [email or web address] °  °Special Instructions Things to remember: °• Use your CPAP when sleeping if this applies to you ° °• [facility] has two free Bariatric Surgery Support Groups that meet monthly °o The 3rd Thursday of each month, 6 pm, [facility]  °o The 2nd Friday of each month, 11:45 am in the private dining room in the basement of [facility] °• It is very important to keep all follow up appointments with your surgeon, dietitian, primary care physician, and behavioral health practitioner °• Routine follow up schedule with your surgeon include appointments at 2-3 weeks, 6-8 weeks, 6 months, and 1 year at a minimum.  Your surgeon may request to see you more often.   °o After the first year, please follow up with your bariatric surgeon and dietitian at least once a year in order to maintain best weight loss results °[facility]: [phone] °[facility]: [phone] °Bariatric Nurse Coordinator: [phone] °  °   Reviewed and Endorsed  °by [facility] Patient Education Committee, June, 2016 °Edits Approved: Aug, 2018 ° ° ° °

## 2018-02-15 NOTE — Anesthesia Postprocedure Evaluation (Signed)
Anesthesia Post Note  Patient: [name]  Procedure(s) Performed: LAPAROSCOPIC GASTRIC SLEEVE RESECTION WITH UPPER ENDO AND ERAS PATHWAY (N/A Abdomen) HERNIA REPAIR HIATAL (N/A Abdomen)     Patient location during evaluation: PACU Anesthesia Type: General Level of consciousness: awake and alert Pain management: pain level controlled Vital Signs Assessment: post-procedure vital signs reviewed and stable Respiratory status: spontaneous breathing, nonlabored ventilation, respiratory function stable and patient connected to nasal cannula oxygen Cardiovascular status: blood pressure returned to baseline and stable Postop Assessment: no apparent nausea or vomiting Anesthetic complications: no    Last Vitals:  Vitals:   02/15/18 1700 02/15/18 1759  BP: 134/73 (!) 146/90  Pulse: 94 99  Resp: 17 17  Temp: 36.9 C 36.6 C  SpO2: 94% 91%    Last Pain:  Vitals:   02/15/18 1759  TempSrc: Oral  PainSc:                  Hailey Vance

## 2018-02-16 ENCOUNTER — Encounter (HOSPITAL_COMMUNITY): Payer: Self-pay | Admitting: General Surgery

## 2018-02-16 LAB — CBC WITH DIFFERENTIAL/PLATELET
Abs Immature Granulocytes: 0.03 10*3/uL (ref 0.00–0.07)
BASOS ABS: 0 10*3/uL (ref 0.0–0.1)
Basophils Relative: 0 %
Eosinophils Absolute: 0 10*3/uL (ref 0.0–0.5)
Eosinophils Relative: 0 %
HCT: 38.1 % (ref 36.0–46.0)
Hemoglobin: 12.1 g/dL (ref 12.0–15.0)
Immature Granulocytes: 0 %
Lymphocytes Relative: 13 %
Lymphs Abs: 1 10*3/uL (ref 0.7–4.0)
MCH: 32.6 pg (ref 26.0–34.0)
MCHC: 31.8 g/dL (ref 30.0–36.0)
MCV: 102.7 fL — ABNORMAL HIGH (ref 80.0–100.0)
MONO ABS: 0.5 10*3/uL (ref 0.1–1.0)
Monocytes Relative: 6 %
Neutro Abs: 6 10*3/uL (ref 1.7–7.7)
Neutrophils Relative %: 81 %
Platelets: 207 10*3/uL (ref 150–400)
RBC: 3.71 MIL/uL — AB (ref 3.87–5.11)
RDW: 13.1 % (ref 11.5–15.5)
WBC: 7.5 10*3/uL (ref 4.0–10.5)
nRBC: 0 % (ref 0.0–0.2)

## 2018-02-16 LAB — ABO/RH: ABO/RH(D): O POS

## 2018-02-16 MED ORDER — LIP MEDEX EX OINT
TOPICAL_OINTMENT | CUTANEOUS | Status: AC
  Filled 2018-02-16: qty 7

## 2018-02-16 MED ORDER — OXYCODONE HCL 5 MG/5ML PO SOLN: 5.0000 mg | ORAL | 0 refills | Status: DC | PRN

## 2018-02-16 MED ORDER — ONDANSETRON HCL 4 MG/2ML IJ SOLN: 4.0000 mg | INTRAMUSCULAR | 0 refills | Status: DC | PRN

## 2018-02-16 NOTE — Progress Notes (Signed)

## 2018-02-16 NOTE — Progress Notes (Signed)
Patient ID: Hailey Vance, female   DOB: 26-Apr-1965, 52 y.o.   MRN: [id number] 1 Day Post-Op   Subjective: Generally doing pretty well.  Had some epigastric pressure last night "gas" which is relieved this morning.  She has been walking.  No significant pain. However when drinking water she has begun to get nauseated and has had to stop.  No vomiting.  Objective: Vital signs in last 24 hours: Temp:  [97.8 F (36.6 C)-98.5 F (36.9 C)] 98.2 F (36.8 C) (12/17 0549) Pulse Rate:  [64-99] 64 (12/17 0549) Resp:  [14-25] 15 (12/17 0549) BP: (121-169)/(73-107) 128/79 (12/17 0549) SpO2:  [88 %-100 %] 93 % (12/17 0549) Weight:  [162 kg] 162 kg (12/17 0549) Last BM Date: 02/14/18  Intake/Output from previous day: 12/16 0701 - 12/17 0700 In: 2976.4 [P.O.:180; I.V.:2646.4; IV Piggyback:150] Out: 1250 [Urine:1225; Blood:25] Intake/Output this shift: No intake/output data recorded.  General appearance: alert, cooperative and no distress GI: normal findings: soft, non-tender Incision/Wound: No erythema or drainage  Lab Results:  Recent Labs    02/15/18 1136 02/15/18 1803 02/16/18 0420  WBC 6.0  --  7.5  HGB 13.4 13.5 12.1  HCT 41.7 42.8 38.1  PLT 230  --  207   BMET Recent Labs    02/15/18 1136  NA 138  K 3.2*  CL 101  CO2 25  GLUCOSE 100*  BUN 28*  CREATININE 1.14*  CALCIUM 9.6     Studies/Results: No results found.  Anti-infectives: Anti-infectives (From admission, onward)   Start     Dose/Rate Route Frequency Ordered Stop   02/15/18 1115  levofloxacin (LEVAQUIN) IVPB 750 mg     750 mg 100 mL/hr over 90 Minutes Intravenous On call to O.R. 02/15/18 1113 02/15/18 1247      Assessment/Plan: s/p Procedure(s): LAPAROSCOPIC GASTRIC SLEEVE RESECTION WITH UPPER ENDO AND ERAS PATHWAY HERNIA REPAIR HIATAL Generally doing well without evidence of complication but the nausea is limiting p.o. intake. Monitor this morning and possibly home later today if nausea  improves.   LOS: 1 day    Edward Jolly 02/16/2018

## 2018-02-16 NOTE — Care Management Note (Signed)
Case Management Note  Patient Details  Name: Hailey Vance MRN: [id number] Date of Birth: March 18, 1965  Subjective/Objective: Morbid obesity. POD#1 Gastric sleeve resection. From home. Spoke to patient in rm about CPAP process-she has already started dme order through her otpt doctor for CPAP with [facility] dme-her concerns are with the insurance-I have informed [facility] rep Santiago Glad to f/u with patient about the CPAP cost process. No further CM needs.                   Action/Plan:d/c home.   Expected Discharge Date:  02/16/18               Expected Discharge Plan:  Home/Self Care  In-House Referral:     Discharge planning Services  CM Consult  Post Acute Care Choice:    Choice offered to:     DME Arranged:    DME Agency:     HH Arranged:    HH Agency:     Status of Service:  Completed, signed off  If discussed at [employer] of Stay Meetings, dates discussed:    Additional Comments:  Dessa Phi, RN 02/16/2018, 10:38 AM

## 2018-02-16 NOTE — Progress Notes (Signed)
Patient alert and oriented, Post op day 1.  Provided support and encouragement.  Encouraged pulmonary toilet, ambulation and small sips of liquids.  On 4th cup of water some nausea.  Medicated.  All questions answered.  Will continue to monitor.

## 2018-02-16 NOTE — Progress Notes (Signed)
Pt able to finish entire protein shake, chocolate premier protein, without difficulty. No further reports of nausea.

## 2018-02-16 NOTE — Plan of Care (Signed)

## 2018-02-17 NOTE — Discharge Summary (Signed)
  Patient ID: Hailey Vance [id number] 52 y.o. [birth year]  02/15/2018  Discharge date and time: 02/16/2018   Admitting Physician: Edward Jolly  Discharge Physician: Edward Jolly  Admission Diagnoses: MORBID OBESITY  Discharge Diagnoses: same  Operations: Procedure(s): [place] HIATAL  Admission Condition: good  Discharged Condition: good  Indication for Admission: Patient with progressive morbid obesity unresponsive to multiple efforts at medical management who presents with a BMI of 60 and comorbidities of degenerative joint disease, obstructive sleep apnea, hypertension and chronic lower extremity edema. After extensive preoperative workup and discussion detailed elsewhere she was electively admitted following laparoscopic sleeve gastrectomy for treatment of her morbid obesity.  Hospital Course: on the day of admission she underwent an uneventful laparoscopic sleeve gastrectomy.  Her postoperative course was uncomplicated.  The following morning vital signs are stable.  Abdomen is benign.  Lab work unremarkable.  She had some nausea early on the morning of discharge which during the day resolved and she was able to tolerate fluids and protein shakes well at the time of discharge.   Disposition: Home  Patient Instructions:  Allergies as of 02/16/2018      Reactions   Penicillins Itching, Other (See Comments)   Has patient had a PCN reaction causing immediate rash, facial/tongue/throat swelling, SOB or lightheadedness with hypotension: No Has patient had a PCN reaction causing severe rash involving mucus membranes or skin necrosis: No Has patient had a PCN reaction that required hospitalization No Has patient had a PCN reaction occurring within the last 10 years: No If all of the above answers are "NO", then may proceed with Cephalosporin use.      Medication List    STOP taking these  medications   aspirin-acetaminophen-caffeine 250-250-65 MG tablet Commonly known as:  EXCEDRIN MIGRAINE   meloxicam 7.5 MG tablet Commonly known as:  MOBIC   nystatin powder Generic drug:  nystatin     TAKE these medications   BIOFREEZE 4 % Gel Generic drug:  Menthol (Topical Analgesic) Apply 1 application topically daily.   lisinopril-hydrochlorothiazide 20-25 MG tablet Commonly known as:  PRINZIDE,ZESTORETIC TAKE one TABLET BY MOUTH ONCE DAILY What changed:    how much to take  how to take this  when to take this  additional instructions   multivitamin-iron-minerals-folic acid chewable tablet Chew 1 tablet by mouth daily.   ondansetron 4 MG/2ML Soln injection Commonly known as:  ZOFRAN Inject 2 mLs (4 mg total) into the vein every 4 (four) hours as needed for nausea.   oxyCODONE 5 MG/5ML solution Commonly known as:  ROXICODONE Take 5-10 mLs (5-10 mg total) by mouth every 4 (four) hours as needed for moderate pain or severe pain.   venlafaxine XR 150 MG 24 hr capsule Commonly known as:  EFFEXOR-XR Take 150 mg by mouth daily with breakfast. What changed:  Another medication with the same name was removed. Continue taking this medication, and follow the directions you see here.   Vitamin D3 50 MCG (2000 UT) Chew Chew 8,000 Units by mouth daily.       Activity: activity as tolerated Diet: bariatric protein shakes Wound Care: none needed  Follow-up:  With Dr. Excell Seltzer in 3 weeks.  Signed: Edward Jolly MD, FACS  02/17/2018, 2:43 PM

## 2018-02-19 ENCOUNTER — Telehealth: Payer: Self-pay | Admitting: *Deleted

## 2018-02-19 NOTE — Telephone Encounter (Signed)
Received request for Medical Records from [employer]; forwarded to Medical Records via email/scan/SLS  

## 2018-02-22 ENCOUNTER — Telehealth (HOSPITAL_COMMUNITY): Payer: Self-pay

## 2018-02-22 NOTE — Telephone Encounter (Signed)
Left message to discuss post bariatric surgery follow up questions.  See below:   1.  Tell me about your pain and pain management?  2.  Let's talk about fluid intake.  How much total fluid are you taking in?  3.  How much protein have you taken in the last 2 days?  4.  Have you had nausea?  Tell me about when have experienced nausea and what you did to help?  5.  Has the frequency or color changed with your urine?  6.  Tell me what your incisions look like?  7.  Have you been passing gas? BM?  8.  If a problem or question were to arise who would you call?  Do you know contact numbers for [facility], [facility], and [facility]?  9.  How has the walking going?  10.  How are your vitamins and calcium going?  How are you taking them?

## 2018-03-01 ENCOUNTER — Ambulatory Visit: Payer: BLUE CROSS/BLUE SHIELD | Admitting: Family

## 2018-03-01 ENCOUNTER — Encounter: Payer: BLUE CROSS/BLUE SHIELD | Attending: General Surgery | Admitting: Skilled Nursing Facility1

## 2018-03-01 VITALS — BP 118/75 | HR 89 | Temp 98.8°F | Resp 16 | Ht 64.0 in | Wt 335.0 lb

## 2018-03-01 DIAGNOSIS — F419 Anxiety disorder, unspecified: Secondary | ICD-10-CM | POA: Diagnosis not present

## 2018-03-01 DIAGNOSIS — I10 Essential (primary) hypertension: Secondary | ICD-10-CM | POA: Diagnosis not present

## 2018-03-01 DIAGNOSIS — Z880 Allergy status to penicillin: Secondary | ICD-10-CM | POA: Insufficient documentation

## 2018-03-01 DIAGNOSIS — Z7982 Long term (current) use of aspirin: Secondary | ICD-10-CM | POA: Insufficient documentation

## 2018-03-01 DIAGNOSIS — F329 Major depressive disorder, single episode, unspecified: Secondary | ICD-10-CM

## 2018-03-01 DIAGNOSIS — Z90722 Acquired absence of ovaries, bilateral: Secondary | ICD-10-CM | POA: Insufficient documentation

## 2018-03-01 DIAGNOSIS — Z90710 Acquired absence of both cervix and uterus: Secondary | ICD-10-CM | POA: Diagnosis not present

## 2018-03-01 DIAGNOSIS — G4730 Sleep apnea, unspecified: Secondary | ICD-10-CM | POA: Insufficient documentation

## 2018-03-01 DIAGNOSIS — Z79899 Other long term (current) drug therapy: Secondary | ICD-10-CM | POA: Insufficient documentation

## 2018-03-01 DIAGNOSIS — Z6841 Body Mass Index (BMI) 40.0 and over, adult: Secondary | ICD-10-CM

## 2018-03-01 DIAGNOSIS — Z8249 Family history of ischemic heart disease and other diseases of the circulatory system: Secondary | ICD-10-CM | POA: Insufficient documentation

## 2018-03-01 DIAGNOSIS — Z713 Dietary counseling and surveillance: Secondary | ICD-10-CM | POA: Insufficient documentation

## 2018-03-01 DIAGNOSIS — M1990 Unspecified osteoarthritis, unspecified site: Secondary | ICD-10-CM | POA: Insufficient documentation

## 2018-03-01 DIAGNOSIS — F32A Depression, unspecified: Secondary | ICD-10-CM

## 2018-03-01 DIAGNOSIS — G4733 Obstructive sleep apnea (adult) (pediatric): Secondary | ICD-10-CM

## 2018-03-01 DIAGNOSIS — Z8261 Family history of arthritis: Secondary | ICD-10-CM | POA: Diagnosis not present

## 2018-03-01 NOTE — Patient Instructions (Addendum)
Please complete lab work prior to leaving. Call to schedule psychiatry appointment.    Psychiatric Services:  [facility], [place] [address] [place], [place] [facility] [place], [place], [facility], [address] [place], [place]

## 2018-03-01 NOTE — Progress Notes (Signed)
Subjective:    Patient ID: Hailey Vance, female    DOB: October 17, 1965, 52 y.o.   MRN: [id number]  HPI  Patient is a 52 yr old female who presents today for follow up.  HTN- continues lisinopril hydrochlorothiazide 20-25 mg once daily. BP Readings from Last 3 Encounters:  03/01/18 118/75  02/16/18 128/79  02/11/18 (!) 186/114    S/p gastric sleeve-she has lost 22 pounds since her surgery 2 weeks ago. Wt Readings from Last 3 Encounters:  03/01/18 (!) 335 lb (152 kg)  02/16/18 (!) 357 lb 2.3 oz (162 kg)  02/11/18 (!) 349 lb (158.3 kg)    Depression- continues effexor. Reports mood has been "a little low." currently taking 62m bid non-extended release- wants extended release.   OSA-she reports that she was never recontacted about running her benefits now that her deductible has been met.  Therefore she is not using CPAP.  She did see the sleep specialist back in October.   Review of Systems See HPI  Past Medical History:  Diagnosis Date  . Anxiety   . Arthritis    osteoarthritis - knees  . Blood transfusion 1990   [facility]  . Blood transfusion without reported diagnosis 1990   after a c/section  . Chest pain   . Chronic back pain   . Depression   . Fibroid   . Headache(784.0)    otc med prn  . Heart murmur   . Hypertension   . Low blood potassium   . Plantar fasciitis of left foot   . Sleep apnea    CPAP  dont hve cpap machine yet  . Vitamin D deficiency 09/06/2014     Social History   Socioeconomic History  . Marital status: Divorced    Spouse name: Not on file  . Number of children: Not on file  . Years of education: Not on file  . Highest education level: Not on file  Occupational History  . Not on file  Social Needs  . Financial resource strain: Not on file  . Food insecurity:    Worry: Not on file    Inability: Not on file  . Transportation needs:    Medical: Not on file    Non-medical: Not on file    Tobacco Use  . Smoking status: Never Smoker  . Smokeless tobacco: Never Used  Substance and Sexual Activity  . Alcohol use: No    Alcohol/week: 0.0 standard drinks  . Drug use: No  . Sexual activity: Not Currently    Partners: Male    Birth control/protection: Surgical    Comment: Tubal  Lifestyle  . Physical activity:    Days per week: Not on file    Minutes per session: Not on file  . Stress: Not on file  Relationships  . Social connections:    Talks on phone: Not on file    Gets together: Not on file    Attends religious service: Not on file    Active member of club or organization: Not on file    Attends meetings of clubs or organizations: Not on file    Relationship status: Not on file  . Intimate partner violence:    Fear of current or ex partner: Not on file    Emotionally abused: Not on file    Physically abused: Not on file    Forced sexual activity: Not on file  Other Topics Concern  . Not  on file  Social History Narrative   Divorced   Lives with son and grand-daughter   [place] (lives in [place])   [place] lives with patient   62- Daughter [name] (lives in [place])   Works at [employer]   Completed some college   Careers adviser    Past Surgical History:  Procedure Laterality Date  . BILATERAL OOPHORECTOMY     Complete removal of left ovary and partial removal of right ovary.  . CESAREAN SECTION  1990   x 1  . COLONOSCOPY WITH PROPOFOL N/A 08/30/2015   Procedure: COLONOSCOPY WITH PROPOFOL;  Surgeon: Mauri Pole, MD;  Location: [facility];  Service: Endoscopy;  Laterality: N/A;  . CYSTOSCOPY N/A 05/23/2015   Procedure: CYSTOSCOPY;  Surgeon: Bobbye Charleston, MD;  Location: [facility];  Service: Gynecology;  Laterality: N/A;  . gtastric sleeve     02-15-18 Dr. Excell Seltzer  . HERNIA REPAIR    . HIATAL HERNIA REPAIR N/A 02/15/2018   Procedure: HERNIA REPAIR HIATAL;  Surgeon: Excell Seltzer, MD;   Location: [facility];  Service: General;  Laterality: N/A;  . HYSTEROSCOPY W/D&C N/A 01/11/2015   Procedure: DILATATION AND CURETTAGE Pollyann Glen;  Surgeon: Jerelyn Charles, MD;  Location: [facility];  Service: Gynecology;  Laterality: N/A;  . KNEE SURGERY     right knee  . LAPAROSCOPIC GASTRIC SLEEVE RESECTION N/A 02/15/2018   Procedure: LAPAROSCOPIC GASTRIC SLEEVE RESECTION WITH UPPER ENDO AND ERAS PATHWAY;  Surgeon: Excell Seltzer, MD;  Location: [facility];  Service: General;  Laterality: N/A;  . LIPOMA EXCISION Left 12/02/2016   Procedure: EXCISION LIPOMA OF LEFT SHOULDER;  Surgeon: Garald Balding, MD;  Location: [facility];  Service: Orthopedics;  Laterality: Left;  . ROBOTIC ASSISTED TOTAL HYSTERECTOMY WITH SALPINGECTOMY Bilateral 05/23/2015   Procedure: ROBOTIC ASSISTED TOTAL HYSTERECTOMY WITH SALPINGECTOMYwith right oophorectomy;  Surgeon: Bobbye Charleston, MD;  Location: [facility];  Service: Gynecology;  Laterality: Bilateral;  . TUBAL LIGATION  1990    Family History  Problem Relation Age of Onset  . Lung cancer Mother 20       dec  . Cancer Mother   . Hypertension Mother   . Arthritis Mother   . Cancer Father 71       prostate  . Hypertension Father   . Leukemia Sister   . Hypertension Sister   . Arthritis Sister   . Hyperlipidemia Sister   . Hypertension Brother   . Arthritis Brother   . Multiple sclerosis Sister   . Hypertension Sister   . Hypertension Sister   . Hypertension Brother   . Hypertension Brother   . Breast cancer Paternal Aunt   . Diabetes Maternal Grandmother   . Stroke Maternal Grandmother   . Heart attack Paternal Grandmother   . Asthma Other     Allergies  Allergen Reactions  . Penicillins Itching and Other (See Comments)    Has patient had a PCN reaction causing immediate rash, facial/tongue/throat swelling, SOB or lightheadedness with hypotension: No Has patient had a PCN reaction causing severe rash involving mucus membranes or skin necrosis: No Has  patient had a PCN reaction that required hospitalization No Has patient had a PCN reaction occurring within the last 10 years: No If all of the above answers are "NO", then may proceed with Cephalosporin use.     Current Outpatient Medications on File Prior to Visit  Medication Sig Dispense Refill  . Ca Phosphate-Cholecalciferol (CALTRATE GUMMY BITES PO) Take  by mouth.    . Cholecalciferol (VITAMIN D3) 50 MCG (2000 UT) CHEW Chew 8,000 Units by mouth daily.    Marland Kitchen lisinopril-hydrochlorothiazide (PRINZIDE,ZESTORETIC) 20-25 MG tablet TAKE one TABLET BY MOUTH ONCE DAILY (Patient taking differently: Take 1 tablet by mouth daily. ) 30 tablet 5  . Menthol, Topical Analgesic, (BIOFREEZE) 4 % GEL Apply 1 application topically daily.    . multivitamin-iron-minerals-folic acid (CENTRUM) chewable tablet Chew 1 tablet by mouth daily.    . ondansetron (ZOFRAN) 4 MG/2ML SOLN injection Inject 2 mLs (4 mg total) into the vein every 4 (four) hours as needed for nausea. 2 mL 0  . oxyCODONE (ROXICODONE) 5 MG/5ML solution Take 5-10 mLs (5-10 mg total) by mouth every 4 (four) hours as needed for moderate pain or severe pain.  0  . venlafaxine XR (EFFEXOR-XR) 150 MG 24 hr capsule Take 150 mg by mouth daily with breakfast.    . [DISCONTINUED] LISINOPRIL PO Take by mouth.     No current facility-administered medications on file prior to visit.     BP 118/75 (BP Location: Right Arm, Patient Position: Sitting, Cuff Size: Large)   Pulse 89   Temp 98.8 F (37.1 C) (Oral)   Resp 16   Ht 5' 4" (1.626 m)   Wt (!) 335 lb (152 kg)   LMP 10/02/2007 Comment: but had bleeding 18 months ago for short period of time and began again 08/2013  SpO2 92%   BMI 57.50 kg/m       Objective:   Physical Exam Constitutional:      Appearance: She is well-developed.  Neck:     Musculoskeletal: Neck supple.     Thyroid: No thyromegaly.  Cardiovascular:     Rate and Rhythm: Normal rate and regular rhythm.     Heart sounds:  Normal heart sounds. No murmur.  Pulmonary:     Effort: Pulmonary effort is normal. No respiratory distress.     Breath sounds: Normal breath sounds. No wheezing.  Abdominal:     Comments: Multiple laparoscopic incisions are noted on abdomen.  All clean and intact.  Right upper incision has a very scant amount of serous drainage.  Skin:    General: Skin is warm and dry.  Neurological:     Mental Status: She is alert and oriented to person, place, and time.  Psychiatric:        Behavior: Behavior normal.        Thought Content: Thought content normal.        Judgment: Judgment normal.           Assessment & Plan:  Obstructive sleep apnea- we will reinitiate referral for home DME.  Hypertension-stable on current dose of medication.  Suspect that as she continues to lose weight we may be able to decrease her dosing.  She notes improvement in her lower extremity edema with her weight loss.   Depression-fair control.  She did not contact psychiatry and I have given her the information again today so.  She will check with her surgeon to see if is okay to transition back to an extended release capsule.  She will let me know what he says via my chart.  If so we will change her to Effexor XR 150.  Morbid obesity-status post gastric sleeve.  Commended patient on her weight loss encouraged her to keep up the good work.

## 2018-03-02 ENCOUNTER — Encounter: Payer: Self-pay | Admitting: Skilled Nursing Facility1

## 2018-03-02 LAB — BASIC METABOLIC PANEL
BUN: 18 mg/dL (ref 6–23)
CO2: 29 mEq/L (ref 19–32)
Calcium: 10 mg/dL (ref 8.4–10.5)
Chloride: 98 mEq/L (ref 96–112)
Creatinine, Ser: 1.08 mg/dL (ref 0.40–1.20)
GFR: 67.98 mL/min (ref >60.00)
Glucose, Bld: 93 mg/dL (ref 70–99)
Potassium: 3.7 mEq/L (ref 3.5–5.1)
Sodium: 139 mEq/L (ref 135–145)

## 2018-03-02 NOTE — Progress Notes (Signed)
Bariatric Class:  Appt start time: 1530 end time:  1630.  2 Week Post-Operative Nutrition Class  Patient was seen on 03/02/2018 for Post-Operative Nutrition education at [facility].   Start Wt at NDES: 346.5 Wt: 334.2 Surgery type: sleeve Surgery date: 02/15/2018  TANITA  BODY COMP RESULTS  03/02/2018   BMI (kg/m^2) 59.2   Fat Mass (lbs) 184.4   Fat Free Mass (lbs) 149.8   Total Body Water (lbs) 112.2   The following the learning objectives were met by the patient during this course:  Identifies Phase 3A (Soft, High Proteins) Dietary Goals and will begin from 2 weeks post-operatively to 2 months post-operatively  Identifies appropriate sources of fluids and proteins   States protein recommendations and appropriate sources post-operatively  Identifies the need for appropriate texture modifications, mastication, and bite sizes when consuming solids  Identifies appropriate multivitamin and calcium sources post-operatively  Describes the need for physical activity post-operatively and will follow MD recommendations  States when to call healthcare provider regarding medication questions or post-operative complications  Handouts given during class include:  Phase 3A: Soft, High Protein Diet Handout  Follow-Up Plan: Patient will follow-up at [facility] in 6 weeks for 2 month post-op nutrition visit for diet advancement per MD.

## 2018-03-04 ENCOUNTER — Other Ambulatory Visit: Payer: Self-pay | Admitting: Family

## 2018-03-04 NOTE — Telephone Encounter (Signed)
See note from patient that it is ok for her to go back on her Effexor / Per OV on 03-01-18 with Debbrah Alar, patient was to call back with this information / Forwarding to the officer to order. /

## 2018-03-04 NOTE — Telephone Encounter (Signed)
Copied from [place] [id number]. Topic: General - Other >> Mar 04, 2018 12:30 PM Wynetta Emery, [place] C wrote: Reason for CRM: pt says that her surgeon would like for her to start back taking venlafaxine XR (EFFEXOR-XR) 150 MG 24 hr capsule.   Pharmacy: [facility], [place].

## 2018-03-05 MED ORDER — VENLAFAXINE HCL ER 150 MG PO CP24: 150.0000 mg | ORAL_CAPSULE | Freq: Every day | ORAL | 3 refills | Status: DC

## 2018-03-08 ENCOUNTER — Telehealth: Payer: Self-pay | Admitting: Skilled Nursing Facility1

## 2018-03-08 NOTE — Telephone Encounter (Signed)
RD called pt to verify fluid intake once starting soft, solid proteins 2 week post-bariatric surgery.   Daily Fluid intake: 32-40 Daily Protein intake: 60+  Concerns/issues:   Pt states she tried a veggie patty so pts surgeon advised her to do 10 more days of a liquid diet. Stating some morning when she drinks anything other than water she gets nauseated in the morning then it lasts for the next couple days.  Pt states she has a bowel movement every 3-4 days with pain. Doing miralax every 3 days.   Advised to:  Drink hot tea Try clear protein drink Orgain protein shakes plant based protein shake  Dietitian will call pt on wednesday to check on progress.

## 2018-03-10 ENCOUNTER — Telehealth: Payer: Self-pay | Admitting: Skilled Nursing Facility1

## 2018-03-10 NOTE — Telephone Encounter (Signed)
Dietitian called pt check in on her progress.   Able to get in  51 oz with hot tea  Pt advised to call dietitian if decrease in fluids.

## 2018-04-13 ENCOUNTER — Ambulatory Visit: Payer: BLUE CROSS/BLUE SHIELD | Admitting: Skilled Nursing Facility1

## 2018-04-13 ENCOUNTER — Telehealth: Payer: Self-pay | Admitting: Skilled Nursing Facility1

## 2018-04-13 NOTE — Telephone Encounter (Signed)
Dietitian returned pts call.  LVM.

## 2018-05-03 ENCOUNTER — Ambulatory Visit: Payer: BLUE CROSS/BLUE SHIELD | Admitting: Family

## 2018-05-05 ENCOUNTER — Encounter: Payer: PRIVATE HEALTH INSURANCE | Attending: General Surgery | Admitting: Skilled Nursing Facility1

## 2018-05-05 DIAGNOSIS — Z79899 Other long term (current) drug therapy: Secondary | ICD-10-CM | POA: Insufficient documentation

## 2018-05-05 DIAGNOSIS — I10 Essential (primary) hypertension: Secondary | ICD-10-CM | POA: Diagnosis not present

## 2018-05-05 DIAGNOSIS — Z90710 Acquired absence of both cervix and uterus: Secondary | ICD-10-CM | POA: Insufficient documentation

## 2018-05-05 DIAGNOSIS — G4730 Sleep apnea, unspecified: Secondary | ICD-10-CM | POA: Diagnosis not present

## 2018-05-05 DIAGNOSIS — Z8249 Family history of ischemic heart disease and other diseases of the circulatory system: Secondary | ICD-10-CM | POA: Insufficient documentation

## 2018-05-05 DIAGNOSIS — M1990 Unspecified osteoarthritis, unspecified site: Secondary | ICD-10-CM | POA: Insufficient documentation

## 2018-05-05 DIAGNOSIS — Z90722 Acquired absence of ovaries, bilateral: Secondary | ICD-10-CM | POA: Diagnosis not present

## 2018-05-05 DIAGNOSIS — Z713 Dietary counseling and surveillance: Secondary | ICD-10-CM | POA: Insufficient documentation

## 2018-05-05 DIAGNOSIS — Z8261 Family history of arthritis: Secondary | ICD-10-CM | POA: Insufficient documentation

## 2018-05-05 DIAGNOSIS — Z7982 Long term (current) use of aspirin: Secondary | ICD-10-CM | POA: Insufficient documentation

## 2018-05-05 DIAGNOSIS — F419 Anxiety disorder, unspecified: Secondary | ICD-10-CM | POA: Insufficient documentation

## 2018-05-05 DIAGNOSIS — F329 Major depressive disorder, single episode, unspecified: Secondary | ICD-10-CM | POA: Insufficient documentation

## 2018-05-05 DIAGNOSIS — Z6841 Body Mass Index (BMI) 40.0 and over, adult: Secondary | ICD-10-CM

## 2018-05-05 DIAGNOSIS — Z880 Allergy status to penicillin: Secondary | ICD-10-CM | POA: Diagnosis not present

## 2018-05-05 NOTE — Progress Notes (Signed)
Follow-up visit: Post-Operative sleeve Surgery   Primary concerns today: Post-operative Bariatric Surgery Nutrition Management.   Pt states she weighs her meats after she cooks them. Pt states she does experience nausea sometimes usually when eating in the morning. Pt states if she does not eat in the morning she gets nauseous and if she eats her stomach feels crampy.  Pt states she feels eggs leave a feeling/taste in her throat causing her to want to throw up about 3 hours later. Pt states she wants to try water aerobics.  Pt states she puts fluid in a bunch of different places in the house to remind her to drink.   Start Wt at NDES: 346.5 Wt: 308.3 Surgery type: sleeve Surgery date: 02/15/2018  Body Composition Scale  Total Body Fat: 50.6 %  Visceral Fat: 24  Fat-Free Mass: 49.3  %   Total Body Water: 39.1  %  Muscle-Mass: 31.2 lbs  Body Fat Displacement:  Torso: 96.9  lbs Left Leg: 19.3  lbs Right Leg: 19.3  lbs Left Arm: 9.6  lbs Right Arm: 9.6  lbs   24-hr recall: 1 protein shake everyday (30g) B (AM): 1 morning star soy sausage and 1 egg (16g) Snk (AM): sugar free jello L (PM): tuna in a pack (14g) Snk (PM):  D (PM): ground [place] patty with 1/2c pinto beans with seasonings (28g) Snk (PM): sugar free popcicle   Fluid intake: water, hot tea with stevia, sugar free lemonade, gatorade zero, decaf coffee with protein shake: averageing 63 oz Estimated total protein intake: 88g  Medications: see list Supplementation: bari adv multi and calcium  Using straws: no Drinking while eating: no Having you been chewing well:no Chewing/swallowing difficulties: no Changes in vision: no Changes to mood/headaches: no Hair loss/Cahnges to skin/Changes to nails: no Any difficulty focusing or concentrating: no Sweating: no Dizziness/Lightheaded: no Palpitations: no  Carbonated beverages: no N/V/D/C/GAS: bowel movement every 2-3 days a week Abdominal Pain: no Dumping  syndrome: no  Recent physical activity:  Walking 3 times a week for 20 minutes   Progress Towards Goal(s):  In progress.  Handouts given during visit include:  Non starchy veggies + protein     Intervention:  Nutrition counseling. Pts diet was advanced to the next phase now including non starchy veggies. Due to the bodies need for essential vitamins, minerals, and fats the pt was educated on the need to consume a certain amount of calories as well as certain nutrients daily. Pt was educated on the need for daily physical activity and to reach a goal of at least 150 minutes of moderate to vigorous physical activity as directed by their physician due to such benefits as increased musculature and improved lab values.   Goals: -Try egg without the yolk then if you still have that bad taste Do not eat eggs -Keep working towards getting in a MINIMUM of 64 fluid ounces a day -Eat non-starchy vegetables 2 times a day 7 days a week -Start out with soft cooked vegetables today and tomorrow; if tolerated begin to eat raw vegetables or cooked including salads -Eat your 3 ounces of protein first then start in on your non-starchy vegetables; once you understand how much of your meal leads to satisfaction and not full while still eating 3 ounces of protein and non-starchy vegetables you can eat them in any order  -Continue to aim for 30 minutes of activity at least 5 times a week -Do NOT cook with/add to your food: alfredo sauce, cheese  sauce, barbeque sauce, ketchup, fat back, butter, bacon grease, grease, Crisco  -Switch to the multivitamin capsule, have food on your stomach first; do not take in the morning (baraitric advantage ultra solo) -Do at least 1 set of arm chair exercises 7 days a week Teaching Method Utilized:  Visual Auditory Hands on  Barriers to learning/adherence to lifestyle change: none stated  Demonstrated degree of understanding via:  Teach Back   Monitoring/Evaluation:  Dietary  intake, exercise, and body weight. Follow up in  June

## 2018-05-05 NOTE — Patient Instructions (Addendum)
-  Try egg without the yolk then if you still have that bad taste Do not eat eggs  -Keep working towards getting in a MINIMUM of 64 fluid ounces a day  -Eat non-starchy vegetables 2 times a day 7 days a week  -Start out with soft cooked vegetables today and tomorrow; if tolerated begin to eat raw vegetables or cooked including salads  -Eat your 3 ounces of protein first then start in on your non-starchy vegetables; once you understand how much of your meal leads to satisfaction and not full while still eating 3 ounces of protein and non-starchy vegetables you can eat them in any order   -Continue to aim for 30 minutes of activity at least 5 times a week  -Do NOT cook with/add to your food: alfredo sauce, cheese sauce, barbeque sauce, ketchup, fat back, butter, bacon grease, grease, Crisco   -Switch to the multivitamin capsule, have food on your stomach first; do not take in the morning (baraitric advantage ultra solo)  -Do at least 1 set of arm chair exercises 7 days a week

## 2018-05-17 ENCOUNTER — Encounter: Payer: Self-pay | Admitting: Family

## 2018-05-17 ENCOUNTER — Ambulatory Visit (INDEPENDENT_AMBULATORY_CARE_PROVIDER_SITE_OTHER): Payer: PRIVATE HEALTH INSURANCE | Admitting: Family

## 2018-05-17 ENCOUNTER — Ambulatory Visit: Payer: BLUE CROSS/BLUE SHIELD | Admitting: Family

## 2018-05-17 ENCOUNTER — Other Ambulatory Visit: Payer: Self-pay

## 2018-05-17 VITALS — BP 131/81 | HR 74 | Temp 98.5°F | Resp 16 | Ht 63.0 in | Wt 310.0 lb

## 2018-05-17 DIAGNOSIS — F32A Depression, unspecified: Secondary | ICD-10-CM

## 2018-05-17 DIAGNOSIS — F419 Anxiety disorder, unspecified: Secondary | ICD-10-CM | POA: Diagnosis not present

## 2018-05-17 DIAGNOSIS — G4733 Obstructive sleep apnea (adult) (pediatric): Secondary | ICD-10-CM

## 2018-05-17 DIAGNOSIS — I10 Essential (primary) hypertension: Secondary | ICD-10-CM | POA: Diagnosis not present

## 2018-05-17 DIAGNOSIS — F329 Major depressive disorder, single episode, unspecified: Secondary | ICD-10-CM

## 2018-05-17 LAB — BASIC METABOLIC PANEL
BUN: 13 mg/dL (ref 6–23)
CO2: 28 mEq/L (ref 19–32)
Calcium: 9.5 mg/dL (ref 8.4–10.5)
Chloride: 103 mEq/L (ref 96–112)
Creatinine, Ser: 0.93 mg/dL (ref 0.40–1.20)
GFR: 75.95 mL/min (ref >60.00)
Glucose, Bld: 95 mg/dL (ref 70–99)
Potassium: 3.8 mEq/L (ref 3.5–5.1)
Sodium: 139 mEq/L (ref 135–145)

## 2018-05-17 NOTE — Patient Instructions (Signed)
Please complete lab work prior to leaving.   

## 2018-05-17 NOTE — Progress Notes (Addendum)
Subjective:    Patient ID: Hailey Vance, female    DOB: 24-Jan-1966, 53 y.o.   MRN: [id number]  HPI   Patient is a 53 yr old female who presents today for follow up.  HTN- continues lisinopril hctz.  BP Readings from Last 3 Encounters:  05/17/18 131/81  03/01/18 118/75  02/16/18 128/79   Depression- reports that her mood is generally OK, Rare down moments.  She continues effexor.    OSA- not yet back on cpap.  Reports that she has obtained the required insurance information and plans to call back DME with this info so they can initiate home cpap.   Wt Readings from Last 3 Encounters:  05/17/18 (!) 310 lb (140.6 kg)  05/05/18 (!) 308 lb 4.8 oz (139.8 kg)  03/02/18 (!) 334 lb 3.2 oz (151.6 kg)    Review of Systems See HPI  Past Medical History:  Diagnosis Date  . Anxiety   . Arthritis    osteoarthritis - knees  . Blood transfusion 1990   In [place], [place] with C/S Surgery - 5 units transfused  . Blood transfusion without reported diagnosis 1990   after a c/section  . Chest pain   . Chronic back pain   . Depression   . Fibroid   . Headache(784.0)    otc med prn  . Heart murmur   . Hypertension   . Low blood potassium   . Plantar fasciitis of left foot   . Sleep apnea    CPAP  dont hve cpap machine yet  . Vitamin D deficiency 09/06/2014     Social History   Socioeconomic History  . Marital status: Divorced    Spouse name: Not on file  . Number of children: Not on file  . Years of education: Not on file  . Highest education level: Not on file  Occupational History  . Not on file  Social Needs  . Financial resource strain: Not on file  . Food insecurity:    Worry: Not on file    Inability: Not on file  . Transportation needs:    Medical: Not on file    Non-medical: Not on file  Tobacco Use  . Smoking status: Never Smoker  . Smokeless tobacco: Never Used  Substance and Sexual Activity  . Alcohol use: No    Alcohol/week: 0.0 standard drinks  . Drug use:  No  . Sexual activity: Not Currently    Partners: Male    Birth control/protection: Surgical    Comment: Tubal  Lifestyle  . Physical activity:    Days per week: Not on file    Minutes per session: Not on file  . Stress: Not on file  Relationships  . Social connections:    Talks on phone: Not on file    Gets together: Not on file    Attends religious service: Not on file    Active member of club or organization: Not on file    Attends meetings of clubs or organizations: Not on file    Relationship status: Not on file  . Intimate partner violence:    Fear of current or ex partner: Not on file    Emotionally abused: Not on file    Physically abused: Not on file    Forced sexual activity: Not on file  Other Topics Concern  . Not on file  Social History Narrative   Divorced   Lives with son and grand-daughter   [place] (lives in  [place])   [place] lives with patient   76- Daughter [name] (lives in [place])   Works at [employer]   Completed some college   Careers adviser    Past Surgical History:  Procedure Laterality Date  . BILATERAL OOPHORECTOMY     Complete removal of left ovary and partial removal of right ovary.  . CESAREAN SECTION  1990   x 1  . COLONOSCOPY WITH PROPOFOL N/A 08/30/2015   Procedure: COLONOSCOPY WITH PROPOFOL;  Surgeon: Mauri Pole, MD;  Location: [facility];  Service: Endoscopy;  Laterality: N/A;  . CYSTOSCOPY N/A 05/23/2015   Procedure: CYSTOSCOPY;  Surgeon: Bobbye Charleston, MD;  Location: [facility];  Service: Gynecology;  Laterality: N/A;  . gtastric sleeve     02-15-18 Dr. Excell Seltzer  . HERNIA REPAIR    . HIATAL HERNIA REPAIR N/A 02/15/2018   Procedure: HERNIA REPAIR HIATAL;  Surgeon: Excell Seltzer, MD;  Location: [facility];  Service: General;  Laterality: N/A;  . HYSTEROSCOPY W/D&C N/A 01/11/2015   Procedure: DILATATION AND CURETTAGE Pollyann Glen;  Surgeon: Jerelyn Charles, MD;  Location: [facility];  Service: Gynecology;  Laterality: N/A;  . KNEE SURGERY     right knee  . LAPAROSCOPIC GASTRIC SLEEVE RESECTION N/A 02/15/2018   Procedure: LAPAROSCOPIC GASTRIC SLEEVE RESECTION WITH UPPER ENDO AND ERAS PATHWAY;  Surgeon: Excell Seltzer, MD;  Location: [facility];  Service: General;  Laterality: N/A;  . LIPOMA EXCISION Left 12/02/2016   Procedure: EXCISION LIPOMA OF LEFT SHOULDER;  Surgeon: Garald Balding, MD;  Location: [facility];  Service: Orthopedics;  Laterality: Left;  . ROBOTIC ASSISTED TOTAL HYSTERECTOMY WITH SALPINGECTOMY Bilateral 05/23/2015   Procedure: ROBOTIC ASSISTED TOTAL HYSTERECTOMY WITH SALPINGECTOMYwith right oophorectomy;  Surgeon: Bobbye Charleston, MD;  Location: [facility];  Service: Gynecology;  Laterality: Bilateral;  . TUBAL LIGATION  1990    Family History  Problem Relation Age of Onset  . Lung cancer Mother 33       dec  . Cancer Mother   . Hypertension Mother   . Arthritis Mother   . Cancer Father 65       prostate  . Hypertension Father   . Leukemia Sister   . Hypertension Sister   . Arthritis Sister   . Hyperlipidemia Sister   . Hypertension Brother   . Arthritis Brother   . Multiple sclerosis Sister   . Hypertension Sister   . Hypertension Sister   . Hypertension Brother   . Hypertension Brother   . Breast cancer Paternal Aunt   . Diabetes Maternal Grandmother   . Stroke Maternal Grandmother   . Heart attack Paternal Grandmother   . Asthma Other     Allergies  Allergen Reactions  . Penicillins Itching and Other (See Comments)    Has patient had a PCN reaction causing immediate rash, facial/tongue/throat swelling, SOB or lightheadedness with hypotension: No Has patient had a PCN reaction causing severe rash involving mucus membranes or skin necrosis: No Has patient had a PCN reaction that required hospitalization No Has patient had a PCN reaction occurring within the last 10 years: No If all of the above answers are "NO", then may proceed  with Cephalosporin use.     Current Outpatient Medications on File Prior to Visit  Medication Sig Dispense Refill  . Ca Phosphate-Cholecalciferol (CALTRATE GUMMY BITES PO) Take by mouth.    . Cholecalciferol (VITAMIN D3) 50 MCG (2000 UT) CHEW Chew 8,000 Units by mouth daily.    Marland Kitchen  lisinopril-hydrochlorothiazide (PRINZIDE,ZESTORETIC) 20-25 MG tablet TAKE one TABLET BY MOUTH ONCE DAILY (Patient taking differently: Take 1 tablet by mouth daily. ) 30 tablet 5  . Menthol, Topical Analgesic, (BIOFREEZE) 4 % GEL Apply 1 application topically daily.    . multivitamin-iron-minerals-folic acid (CENTRUM) chewable tablet Chew 1 tablet by mouth daily.    . ondansetron (ZOFRAN) 4 MG/2ML SOLN injection Inject 2 mLs (4 mg total) into the vein every 4 (four) hours as needed for nausea. 2 mL 0  . oxyCODONE (ROXICODONE) 5 MG/5ML solution Take 5-10 mLs (5-10 mg total) by mouth every 4 (four) hours as needed for moderate pain or severe pain.  0  . venlafaxine XR (EFFEXOR-XR) 150 MG 24 hr capsule Take 1 capsule (150 mg total) by mouth daily with breakfast. 30 capsule 3  . [DISCONTINUED] LISINOPRIL PO Take by mouth.     No current facility-administered medications on file prior to visit.     BP 131/81 (BP Location: Right Arm, Patient Position: Sitting, Cuff Size: Large)   Pulse 74   Temp 98.5 F (36.9 C) (Oral)   Resp 16   Ht 5\' 3"  (1.6 m)   Wt (!) 310 lb (140.6 kg)   LMP 10/02/2007 Comment: but had bleeding 18 months ago for short period of time and began again 08/2013  SpO2 98%   BMI 54.91 kg/m       Objective:   Physical Exam Constitutional:      Appearance: She is well-developed.  Neck:     Musculoskeletal: Neck supple.     Thyroid: No thyromegaly.  Cardiovascular:     Rate and Rhythm: Normal rate and regular rhythm.     Heart sounds: Normal heart sounds. No murmur.  Pulmonary:     Effort: Pulmonary effort is normal. No respiratory distress.     Breath sounds: Normal breath sounds. No  wheezing.  Skin:    General: Skin is warm and dry.  Neurological:     Mental Status: She is alert and oriented to person, place, and time.  Psychiatric:        Behavior: Behavior normal.        Thought Content: Thought content normal.        Judgment: Judgment normal.           Assessment & Plan:  HTN- blood pressure is stable. continue current meds.   Depression- stable on effexor.   OSA- encouraged pt to contact DME to get cpap started.   Morbid obesity- s/p gastric sleeve 12/19.  Weight 9/27 was 361. She has lost 51 pounds. Encouraged pt to keep up the good work.   Wt Readings from Last 3 Encounters:  05/17/18 (!) 310 lb (140.6 kg)  05/05/18 (!) 308 lb 4.8 oz (139.8 kg)  03/02/18 (!) 334 lb 3.2 oz (151.6 kg)

## 2018-08-05 ENCOUNTER — Ambulatory Visit: Payer: PRIVATE HEALTH INSURANCE | Admitting: Skilled Nursing Facility1

## 2018-08-10 ENCOUNTER — Encounter: Payer: PRIVATE HEALTH INSURANCE | Attending: General Surgery | Admitting: Skilled Nursing Facility1

## 2018-08-10 ENCOUNTER — Other Ambulatory Visit: Payer: Self-pay

## 2018-08-10 DIAGNOSIS — Z79899 Other long term (current) drug therapy: Secondary | ICD-10-CM | POA: Diagnosis not present

## 2018-08-10 DIAGNOSIS — I10 Essential (primary) hypertension: Secondary | ICD-10-CM | POA: Insufficient documentation

## 2018-08-10 DIAGNOSIS — Z8249 Family history of ischemic heart disease and other diseases of the circulatory system: Secondary | ICD-10-CM | POA: Diagnosis not present

## 2018-08-10 DIAGNOSIS — M1990 Unspecified osteoarthritis, unspecified site: Secondary | ICD-10-CM | POA: Diagnosis not present

## 2018-08-10 DIAGNOSIS — G4730 Sleep apnea, unspecified: Secondary | ICD-10-CM | POA: Insufficient documentation

## 2018-08-10 DIAGNOSIS — Z713 Dietary counseling and surveillance: Secondary | ICD-10-CM | POA: Diagnosis not present

## 2018-08-10 DIAGNOSIS — Z6841 Body Mass Index (BMI) 40.0 and over, adult: Secondary | ICD-10-CM

## 2018-08-10 DIAGNOSIS — F329 Major depressive disorder, single episode, unspecified: Secondary | ICD-10-CM | POA: Insufficient documentation

## 2018-08-10 DIAGNOSIS — Z8261 Family history of arthritis: Secondary | ICD-10-CM | POA: Insufficient documentation

## 2018-08-10 DIAGNOSIS — Z880 Allergy status to penicillin: Secondary | ICD-10-CM | POA: Diagnosis not present

## 2018-08-10 DIAGNOSIS — Z90722 Acquired absence of ovaries, bilateral: Secondary | ICD-10-CM | POA: Diagnosis not present

## 2018-08-10 DIAGNOSIS — Z7982 Long term (current) use of aspirin: Secondary | ICD-10-CM | POA: Diagnosis not present

## 2018-08-10 DIAGNOSIS — F419 Anxiety disorder, unspecified: Secondary | ICD-10-CM | POA: Insufficient documentation

## 2018-08-10 DIAGNOSIS — Z90710 Acquired absence of both cervix and uterus: Secondary | ICD-10-CM | POA: Insufficient documentation

## 2018-08-10 NOTE — Progress Notes (Signed)
Follow-up visit: Post-Operative sleeve Surgery   Primary concerns today: Post-operative Bariatric Surgery Nutrition Management.  Pt states she has cramps and pain here and there: beef (cooked tender; chewed well): cramping; salad:spinach, romaine lettuce, cucumber, tomatoes, onion with lite ranch eaten (cramps) so she eats salmon, [place], and chicken. Pt states she eats celery raw and has no issues. Pt states it has been about a month since she has tried salad. Pt states she has been really thirsty. Pt states she sometimes does not have a bowel movement in a few days because her gandkids are making too much noise. Pt states she is having some right arm at the shoulder weakness so she is going to talk to her doctor about this. Pt states she cannot walk too far without the walker.  Pt emailed summary and handouts.   Start Wt at [facility]: 346.5 Wt: phone appt Surgery type: sleeve Surgery date: 02/15/2018   24-hr recall: 1 protein shake throughout the week (30g) B (AM): 1 morning star soy sausage and 1 egg (16g) or premade jimmy dean egg and bacon  Snk (AM): sugar free jello L (PM): tuna in a pack (14g) Snk (PM):  D (PM): ground [place] patty with 1/2c pinto beans with seasonings (28g) carrots or green beans or zucchini with salmon or chicken  Snk (PM): sugar free popcicle   Fluid intake: water, hot tea with stevia, sugar free lemonade, gatorade zero, decaf coffee with protein shake: averageing 70 oz Estimated total protein intake: 88g  Medications: see list Supplementation: bari adv multi chewable and calcium  Using straws: no Drinking while eating: no Having you been chewing well:no Chewing/swallowing difficulties: no Changes in vision: no Changes to mood/headaches: no Hair loss/Changes to skin/Changes to nails: no Any difficulty focusing or concentrating: no Sweating: no Dizziness/Lightheaded: no Palpitations: no  Carbonated beverages: no N/V/D/C/GAS: no Abdominal Pain: no Dumping  syndrome: no  Recent physical activity:  A little walking and some chair exercises   Progress Towards Goal(s):  In progress.     Intervention:  Nutrition counseling. Pts diet was advanced to the next phase now including non starchy veggies. Due to the bodies need for essential vitamins, minerals, and fats the pt was educated on the need to consume a certain amount of calories as well as certain nutrients daily. Pt was educated on the need for daily physical activity and to reach a goal of at least 150 minutes of moderate to vigorous physical activity as directed by their physician due to such benefits as increased musculature and improved lab values.   Goals: -Try salad again  -Aim to have non starchy vegetables 2 times a day 7 days a week -Aim to have at least half of your fluids being plain water  -Do not try the salad and fruit in the same day  -Stick to 1/2 cup at a time for your fruit -If making a smoothie add spinach or some other non-starchy vegetable: limit to 8 ounces at one sitting -Try fruit at home  -Starchy vegetables (peas, corn, potato) are a nice addition to your meals; stick to 1/3-1/4 cup to ensure room for protein and non-starchy vegetable -Try the capsule multivitamin one a day: HAVE FOOD ON YOUR STOMACH FIRST; TAKE LATER IN THE DAY -Try tums for your calcium  -Ensure you are getting in a minimum of 70 ounces a day  -Be sure to be active for at least 150 minutes a week: 30 minutes most days of the week 10 minutes at  a time \ Teaching Method Utilized:  Visual Auditory Hands on  Barriers to learning/adherence to lifestyle change: none stated  Demonstrated degree of understanding via:  Teach Back   Monitoring/Evaluation:  Dietary intake, exercise, and body weight. Follow up in  3 months

## 2018-08-17 ENCOUNTER — Ambulatory Visit (INDEPENDENT_AMBULATORY_CARE_PROVIDER_SITE_OTHER): Payer: PRIVATE HEALTH INSURANCE | Admitting: Family

## 2018-08-17 ENCOUNTER — Encounter: Payer: Self-pay | Admitting: Family

## 2018-08-17 ENCOUNTER — Other Ambulatory Visit: Payer: Self-pay

## 2018-08-17 DIAGNOSIS — F329 Major depressive disorder, single episode, unspecified: Secondary | ICD-10-CM | POA: Diagnosis not present

## 2018-08-17 DIAGNOSIS — I10 Essential (primary) hypertension: Secondary | ICD-10-CM

## 2018-08-17 DIAGNOSIS — F32A Depression, unspecified: Secondary | ICD-10-CM

## 2018-08-17 MED ORDER — BUPROPION HCL ER (XL) 150 MG PO TB24: 150.0000 mg | ORAL_TABLET | Freq: Every day | ORAL | 1 refills | Status: DC

## 2018-08-17 NOTE — Progress Notes (Signed)
Virtual Visit via Video Note  I connected with Hailey Vance on 08/17/18 at  1:20 PM EDT by a video enabled telemedicine application and verified that I am speaking with the correct person using two identifiers.  Location: Patient: daughter's house Provider: office   I discussed the limitations of evaluation and management by telemedicine and the availability of in person appointments. The patient expressed understanding and agreed to proceed.  History of Present Illness:   Patient is a 53 yr old female who presents today for follow up.  HTN- does not check bp at home. Maintained on lisinopril-hctz and notes good compliance.   BP Readings from Last 3 Encounters:  05/17/18 131/81  03/01/18 118/75  02/16/18 128/79   Depression- reports that sometimes she feels like the effexor is "not working at all."  Reports that her patience is very low.  Reports that she has some difficulty sleeping, wakes up and can't fall back asleep.  Does not look forward to things. Denies hopelessness.  Reports that she feels restricted due to mobility issues with her knee.   Morbid obesity- Reports last weight was 289. Reports that she as lost weight due to her surgery.  Reports that her pre-op weight was 357. Wishes she could exercise more but her knee interferes with her ability to exercise.   Wt Readings from Last 3 Encounters:  05/17/18 (!) 310 lb (140.6 kg)  05/05/18 (!) 308 lb 4.8 oz (139.8 kg)  03/02/18 (!) 334 lb 3.2 oz (151.6 kg)     Observations/Objective:   Gen: Awake, alert, no acute distress Resp: Breathing is even and non-labored Psych: calm/pleasant demeanor, mildly tearful Neuro: Alert and Oriented x 3, + facial symmetry, speech is clear.  Assessment and Plan:  1) depression- uncontrolled. Plan to continue effexor and will add wellbutrin once daily.   2) HTN- tolerating medication, continue same, plan to check bp at her face to face in 6 weeks.   3) Morbid obesity- she continues to  lose weight following her gastric sleeve.    Follow Up Instructions:    I discussed the assessment and treatment plan with the patient. The patient was provided an opportunity to ask questions and all were answered. The patient agreed with the plan and demonstrated an understanding of the instructions.   The patient was advised to call back or seek an in-person evaluation if the symptoms worsen or if the condition fails to improve as anticipated.  Nance Pear, NP

## 2018-09-11 ENCOUNTER — Other Ambulatory Visit: Payer: Self-pay | Admitting: Family

## 2018-09-27 ENCOUNTER — Encounter: Payer: Self-pay | Admitting: Family

## 2018-09-28 ENCOUNTER — Ambulatory Visit: Payer: PRIVATE HEALTH INSURANCE | Admitting: Family

## 2018-10-19 ENCOUNTER — Encounter: Payer: Self-pay | Admitting: Family

## 2018-10-19 ENCOUNTER — Other Ambulatory Visit: Payer: Self-pay

## 2018-10-19 ENCOUNTER — Ambulatory Visit (INDEPENDENT_AMBULATORY_CARE_PROVIDER_SITE_OTHER): Payer: Self-pay | Admitting: Family

## 2018-10-19 VITALS — BP 139/89 | HR 80 | Temp 96.8°F | Resp 16 | Ht 63.0 in | Wt 278.0 lb

## 2018-10-19 DIAGNOSIS — F329 Major depressive disorder, single episode, unspecified: Secondary | ICD-10-CM

## 2018-10-19 DIAGNOSIS — F32A Depression, unspecified: Secondary | ICD-10-CM

## 2018-10-19 DIAGNOSIS — F419 Anxiety disorder, unspecified: Secondary | ICD-10-CM | POA: Diagnosis not present

## 2018-10-19 DIAGNOSIS — I10 Essential (primary) hypertension: Secondary | ICD-10-CM | POA: Diagnosis not present

## 2018-10-19 LAB — BASIC METABOLIC PANEL
BUN: 16 mg/dL (ref 6–23)
CO2: 29 mEq/L (ref 19–32)
Calcium: 9.6 mg/dL (ref 8.4–10.5)
Chloride: 104 mEq/L (ref 96–112)
Creatinine, Ser: 1 mg/dL (ref 0.40–1.20)
GFR: 69.74 mL/min (ref >60.00)
Glucose, Bld: 90 mg/dL (ref 70–99)
Potassium: 3.6 mEq/L (ref 3.5–5.1)
Sodium: 140 mEq/L (ref 135–145)

## 2018-10-19 MED ORDER — BUPROPION HCL ER (XL) 300 MG PO TB24: 300.0000 mg | ORAL_TABLET | Freq: Every day | ORAL | 2 refills | Status: DC

## 2018-10-19 MED ORDER — VENLAFAXINE HCL ER 150 MG PO CP24: 150.0000 mg | ORAL_CAPSULE | Freq: Every day | ORAL | 1 refills | Status: DC

## 2018-10-19 NOTE — Patient Instructions (Addendum)
Please increase wellbutrin to 300 mg once daily.   Please schedule an appointment with a counselor.

## 2018-10-19 NOTE — Progress Notes (Signed)
Subjective:    Patient ID: Hailey Vance, female    DOB: Mar 21, 1965, 53 y.o.   MRN: [id number]  HPI  Patient is a 53 yr old female who presents today for follow up.  Depression- reports that she is not currently working.  As a result she feels that she has too much time on her hands to think about things.  She is staying with her daughter. Daughter tries to get her to get out of bed. Feels like she does not care about anything.  Reports that she sometimes wishes that she wasn't here but denies any thoughts of hurting herself/suicidal plan.  She is waiting on her disability to come through. As a result she does not have financial independence currently and has been more dependent on her children which she is upset about.    HTN- She continues lisinopril/hctz.   BP Readings from Last 3 Encounters:  10/19/18 139/89  05/17/18 131/81  03/01/18 118/75     Review of Systems See HPI  Past Medical History:  Diagnosis Date  . Anxiety   . Arthritis    osteoarthritis - knees  . Blood transfusion 1990   In [place], [place] with C/S Surgery - 5 units transfused  . Blood transfusion without reported diagnosis 1990   after a c/section  . Chest pain   . Chronic back pain   . Depression   . Fibroid   . Headache(784.0)    otc med prn  . Heart murmur   . Hypertension   . Low blood potassium   . Plantar fasciitis of left foot   . Sleep apnea    CPAP  dont hve cpap machine yet  . Vitamin D deficiency 09/06/2014     Social History   Socioeconomic History  . Marital status: Divorced    Spouse name: Not on file  . Number of children: Not on file  . Years of education: Not on file  . Highest education level: Not on file  Occupational History  . Not on file  Social Needs  . Financial resource strain: Not on file  . Food insecurity    Worry: Not on file    Inability: Not on file  . Transportation needs    Medical: Not on file    Non-medical: Not on file  Tobacco Use  . Smoking  status: Never Smoker  . Smokeless tobacco: Never Used  Substance and Sexual Activity  . Alcohol use: No    Alcohol/week: 0.0 standard drinks  . Drug use: No  . Sexual activity: Not Currently    Partners: Male    Birth control/protection: Surgical    Comment: Tubal  Lifestyle  . Physical activity    Days per week: Not on file    Minutes per session: Not on file  . Stress: Not on file  Relationships  . Social Herbalist on phone: Not on file    Gets together: Not on file    Attends religious service: Not on file    Active member of club or organization: Not on file    Attends meetings of clubs or organizations: Not on file    Relationship status: Not on file  . Intimate partner violence    Fear of current or ex partner: Not on file    Emotionally abused: Not on file    Physically abused: Not on file    Forced sexual activity: Not on file  Other Topics Concern  . Not  on file  Social History Narrative   Divorced   Lives with son and grand-daughter   [place] (lives in [place])   [place] lives with patient   38- Daughter [name] (lives in [place])   Works at [employer]   Completed some college   Careers adviser    Past Surgical History:  Procedure Laterality Date  . BILATERAL OOPHORECTOMY     Complete removal of left ovary and partial removal of right ovary.  . CESAREAN SECTION  1990   x 1  . COLONOSCOPY WITH PROPOFOL N/A 08/30/2015   Procedure: COLONOSCOPY WITH PROPOFOL;  Surgeon: Mauri Pole, MD;  Location: [facility];  Service: Endoscopy;  Laterality: N/A;  . CYSTOSCOPY N/A 05/23/2015   Procedure: CYSTOSCOPY;  Surgeon: Bobbye Charleston, MD;  Location: [facility];  Service: Gynecology;  Laterality: N/A;  . gtastric sleeve     02-15-18 Dr. Excell Seltzer  . HERNIA REPAIR    . HIATAL HERNIA REPAIR N/A 02/15/2018   Procedure: HERNIA REPAIR HIATAL;  Surgeon: Excell Seltzer, MD;  [facility]: [facility];  Service: General;   Laterality: N/A;  . HYSTEROSCOPY W/D&C N/A 01/11/2015   Procedure: DILATATION AND CURETTAGE Pollyann Glen;  Surgeon: Jerelyn Charles, MD;  Location: [facility];  Service: Gynecology;  Laterality: N/A;  . KNEE SURGERY     right knee  . LAPAROSCOPIC GASTRIC SLEEVE RESECTION N/A 02/15/2018   Procedure: LAPAROSCOPIC GASTRIC SLEEVE RESECTION WITH UPPER ENDO AND ERAS PATHWAY;  Surgeon: Excell Seltzer, MD;  [facility];  Service: General;  Laterality: N/A;  . LIPOMA EXCISION Left 12/02/2016   Procedure: EXCISION LIPOMA OF LEFT SHOULDER;  Surgeon: Garald Balding, MD;  Location: [facility];  Service: Orthopedics;  Laterality: Left;  . ROBOTIC ASSISTED TOTAL HYSTERECTOMY WITH SALPINGECTOMY Bilateral 05/23/2015   Procedure: ROBOTIC ASSISTED TOTAL HYSTERECTOMY WITH SALPINGECTOMYwith right oophorectomy;  Surgeon: Bobbye Charleston, MD;  Location: [facility];  Service: Gynecology;  Laterality: Bilateral;  . TUBAL LIGATION  1990    Family History  Problem Relation Age of Onset  . Lung cancer Mother 18       dec  . Cancer Mother   . Hypertension Mother   . Arthritis Mother   . Cancer Father 47       prostate  . Hypertension Father   . Leukemia Sister   . Hypertension Sister   . Arthritis Sister   . Hyperlipidemia Sister   . Hypertension Brother   . Arthritis Brother   . Multiple sclerosis Sister   . Hypertension Sister   . Hypertension Sister   . Hypertension Brother   . Hypertension Brother   . Breast cancer Paternal Aunt   . Diabetes Maternal Grandmother   . Stroke Maternal Grandmother   . Heart attack Paternal Grandmother   . Asthma Other     Allergies  Allergen Reactions  . Penicillins Itching and Other (See Comments)    Has patient had a PCN reaction causing immediate rash, facial/tongue/throat swelling, SOB or lightheadedness with hypotension: No Has patient had a PCN reaction causing severe rash involving mucus membranes or skin necrosis: No Has patient had a PCN reaction that  required hospitalization No Has patient had a PCN reaction occurring within the last 10 years: No If all of the above answers are "NO", then may proceed with Cephalosporin use.     Current Outpatient Medications on File Prior to Visit  Medication Sig Dispense Refill  . Ca Phosphate-Cholecalciferol (CALTRATE GUMMY BITES PO) Take  by mouth.    . Cholecalciferol (VITAMIN D3) 50 MCG (2000 UT) CHEW Chew 8,000 Units by mouth daily.    Marland Kitchen lisinopril-hydrochlorothiazide (ZESTORETIC) 20-25 MG tablet Take 1 tablet by mouth once daily 90 tablet 0  . Menthol, Topical Analgesic, (BIOFREEZE) 4 % GEL Apply 1 application topically daily.    . multivitamin-iron-minerals-folic acid (CENTRUM) chewable tablet Chew 1 tablet by mouth daily.    Marland Kitchen venlafaxine XR (EFFEXOR-XR) 150 MG 24 hr capsule Take 1 capsule (150 mg total) by mouth daily with breakfast. 30 capsule 3  . [DISCONTINUED] LISINOPRIL PO Take by mouth.     No current facility-administered medications on file prior to visit.     BP 139/89 (BP Location: Right Arm, Patient Position: Sitting, Cuff Size: Large)   Pulse 80   Temp (!) 96.8 F (36 C) (Temporal)   Resp 16   Ht 5\' 3"  (1.6 m)   Wt 278 lb (126.1 kg)   LMP 10/02/2007 Comment: but had bleeding 18 months ago for short period of time and began again 08/2013  SpO2 98%   BMI 49.25 kg/m       Objective:   Physical Exam Constitutional:      Appearance: She is well-developed.  Neck:     Musculoskeletal: Neck supple.     Thyroid: No thyromegaly.  Cardiovascular:     Rate and Rhythm: Normal rate and regular rhythm.     Heart sounds: Normal heart sounds. No murmur.  Pulmonary:     Effort: Pulmonary effort is normal. No respiratory distress.     Breath sounds: Normal breath sounds. No wheezing.  Musculoskeletal:     Right lower leg: Edema present.     Left lower leg: Edema present.  Skin:    General: Skin is warm and dry.  Neurological:     Mental Status: She is alert and oriented to  person, place, and time.  Psychiatric:        Behavior: Behavior normal.        Thought Content: Thought content normal.        Judgment: Judgment normal.     Comments: Slightly flattened affect           Assessment & Plan:  Anxiety/depression- Anxiety is stable but depression appears uncontrolled. Plan to continue current dose of effexor. I advised pt to increase wellbutrin to 300mg  and to establish with a counselor. I gave her contact information for counseling so she can schedule an appointment.  HTN- bp stable on current medication. Continue same. Obtain follow up bmet.

## 2018-11-02 ENCOUNTER — Encounter: Payer: Medicaid Other | Attending: General Surgery | Admitting: Skilled Nursing Facility1

## 2018-11-02 DIAGNOSIS — Z8249 Family history of ischemic heart disease and other diseases of the circulatory system: Secondary | ICD-10-CM | POA: Diagnosis not present

## 2018-11-02 DIAGNOSIS — Z90722 Acquired absence of ovaries, bilateral: Secondary | ICD-10-CM | POA: Diagnosis not present

## 2018-11-02 DIAGNOSIS — F329 Major depressive disorder, single episode, unspecified: Secondary | ICD-10-CM | POA: Diagnosis not present

## 2018-11-02 DIAGNOSIS — M1990 Unspecified osteoarthritis, unspecified site: Secondary | ICD-10-CM | POA: Insufficient documentation

## 2018-11-02 DIAGNOSIS — Z7982 Long term (current) use of aspirin: Secondary | ICD-10-CM | POA: Insufficient documentation

## 2018-11-02 DIAGNOSIS — I10 Essential (primary) hypertension: Secondary | ICD-10-CM | POA: Diagnosis not present

## 2018-11-02 DIAGNOSIS — E669 Obesity, unspecified: Secondary | ICD-10-CM

## 2018-11-02 DIAGNOSIS — Z880 Allergy status to penicillin: Secondary | ICD-10-CM | POA: Diagnosis not present

## 2018-11-02 DIAGNOSIS — Z90710 Acquired absence of both cervix and uterus: Secondary | ICD-10-CM | POA: Diagnosis not present

## 2018-11-02 DIAGNOSIS — Z79899 Other long term (current) drug therapy: Secondary | ICD-10-CM | POA: Insufficient documentation

## 2018-11-02 DIAGNOSIS — G4730 Sleep apnea, unspecified: Secondary | ICD-10-CM | POA: Diagnosis not present

## 2018-11-02 DIAGNOSIS — Z713 Dietary counseling and surveillance: Secondary | ICD-10-CM | POA: Diagnosis present

## 2018-11-02 DIAGNOSIS — Z6841 Body Mass Index (BMI) 40.0 and over, adult: Secondary | ICD-10-CM | POA: Diagnosis not present

## 2018-11-02 DIAGNOSIS — F419 Anxiety disorder, unspecified: Secondary | ICD-10-CM | POA: Insufficient documentation

## 2018-11-02 DIAGNOSIS — Z8261 Family history of arthritis: Secondary | ICD-10-CM | POA: Diagnosis not present

## 2018-11-02 NOTE — Progress Notes (Signed)
Follow-up visit: Post-Operative sleeve Surgery   Primary concerns today: Post-operative Bariatric Surgery Nutrition Management.  Pt states she was 280 in June and then in July 286 pounds then in August 276 pounds and now is 278 pounds.  Pt states she has been very stressed needing her wellbutrin increased. Pt states she moved in with her daughter about 1 year ago.  Pt states she cries a lot and feels hopeless. Pt states her disability just got approved. Pt states her doctor advised she speak with a psychiatrist. Pt states she did not want to do video visits with her daughter and granddaughter around but her daughter got a car so she will do it in there.  Pt states she can drink a lot more at once making drinking easier.   Due to pts depressed feelings food will be more discussed at the next appt once she has spoken with a psychologist.  Pt states when she does not take her Wellbutrin she is irritable and aggressive. Pt states her daughter majored in psychology to she approaches her in a helpful way.  Pt states he has days where she does not bat and does not get out of bed.    Start Wt at [facility]: 346.5 Wt: video appt Surgery type: sleeve Surgery date: 02/15/2018   24-hr recall: 1 protein shake throughout the week (30g) B (AM): 1 morning star soy sausage and 1 egg (16g) or premade jimmy dean egg and bacon  Snk (AM): sugar free jello L (PM): tuna in a pack (14g) Snk (PM):  D (PM): ground [place] patty with 1/2c pinto beans with seasonings (28g) carrots or green beans or zucchini with salmon or chicken  Snk (PM): sugar free popcicle   Fluid intake: water, hot tea with stevia, sugar free lemonade, gatorade zero, decaf coffee with protein shake: averageing 70 oz Estimated total protein intake: 88g  Medications: see list Supplementation: bari adv multi chewable and calcium  Using straws: no Drinking while eating: no Having you been chewing well:no Chewing/swallowing difficulties:  no Changes in vision: no Changes to mood/headaches: no Hair loss/Changes to skin/Changes to nails: no Any difficulty focusing or concentrating: no Sweating: no Dizziness/Lightheaded: no Palpitations: no  Carbonated beverages: no N/V/D/C/GAS: no Abdominal Pain: no Dumping syndrome: no  Recent physical activity:  A little walking and some chair exercises   Progress Towards Goal(s):  In progress.     Intervention:  Nutrition counseling. Pts diet was advanced to the next phase now including non starchy veggies. Due to the bodies need for essential vitamins, minerals, and fats the pt was educated on the need to consume a certain amount of calories as well as certain nutrients daily. Pt was educated on the need for daily physical activity and to reach a goal of at least 150 minutes of moderate to vigorous physical activity as directed by their physician due to such benefits as increased musculature and improved lab values.   Goals:  Get a psychiatrist and a physchologist  Change to the liquid multivitamin  On no appetite day be sure to drink a protein shake and still take the multi and calcium  Do bari support group: sent via email \ Teaching Method Utilized:  Visual Auditory Hands on  Barriers to learning/adherence to lifestyle change: none stated  Demonstrated degree of understanding via:  Teach Back   Monitoring/Evaluation:  Dietary intake, exercise, and body weight. Follow up in  6 weeks

## 2018-11-16 NOTE — Progress Notes (Signed)
Sent Doxy invitation without response, called pt. No answer. Left message requesting that she reschedule appointment.

## 2018-11-17 ENCOUNTER — Other Ambulatory Visit: Payer: Self-pay

## 2018-11-17 ENCOUNTER — Telehealth: Payer: Medicaid Other | Admitting: Family

## 2018-11-17 DIAGNOSIS — Z91199 Patient's noncompliance with other medical treatment and regimen due to unspecified reason: Secondary | ICD-10-CM

## 2018-11-17 DIAGNOSIS — Z5329 Procedure and treatment not carried out because of patient's decision for other reasons: Secondary | ICD-10-CM

## 2018-12-28 ENCOUNTER — Encounter: Payer: Medicaid Other | Attending: General Surgery | Admitting: Skilled Nursing Facility1

## 2018-12-28 DIAGNOSIS — M1990 Unspecified osteoarthritis, unspecified site: Secondary | ICD-10-CM | POA: Insufficient documentation

## 2018-12-28 DIAGNOSIS — Z6841 Body Mass Index (BMI) 40.0 and over, adult: Secondary | ICD-10-CM | POA: Insufficient documentation

## 2018-12-28 DIAGNOSIS — Z713 Dietary counseling and surveillance: Secondary | ICD-10-CM | POA: Insufficient documentation

## 2018-12-28 DIAGNOSIS — F329 Major depressive disorder, single episode, unspecified: Secondary | ICD-10-CM | POA: Insufficient documentation

## 2018-12-28 DIAGNOSIS — G4730 Sleep apnea, unspecified: Secondary | ICD-10-CM | POA: Insufficient documentation

## 2018-12-28 DIAGNOSIS — I10 Essential (primary) hypertension: Secondary | ICD-10-CM | POA: Insufficient documentation

## 2018-12-28 DIAGNOSIS — F419 Anxiety disorder, unspecified: Secondary | ICD-10-CM | POA: Insufficient documentation

## 2018-12-28 DIAGNOSIS — Z8261 Family history of arthritis: Secondary | ICD-10-CM | POA: Insufficient documentation

## 2018-12-28 DIAGNOSIS — Z8249 Family history of ischemic heart disease and other diseases of the circulatory system: Secondary | ICD-10-CM | POA: Insufficient documentation

## 2018-12-28 DIAGNOSIS — Z79899 Other long term (current) drug therapy: Secondary | ICD-10-CM | POA: Insufficient documentation

## 2018-12-28 DIAGNOSIS — Z90710 Acquired absence of both cervix and uterus: Secondary | ICD-10-CM | POA: Insufficient documentation

## 2018-12-28 DIAGNOSIS — Z90722 Acquired absence of ovaries, bilateral: Secondary | ICD-10-CM | POA: Insufficient documentation

## 2018-12-28 DIAGNOSIS — Z7982 Long term (current) use of aspirin: Secondary | ICD-10-CM | POA: Insufficient documentation

## 2018-12-28 DIAGNOSIS — Z880 Allergy status to penicillin: Secondary | ICD-10-CM | POA: Insufficient documentation

## 2019-01-10 ENCOUNTER — Other Ambulatory Visit: Payer: Self-pay | Admitting: Family

## 2019-03-10 ENCOUNTER — Encounter: Payer: Self-pay | Admitting: Family

## 2019-03-15 ENCOUNTER — Ambulatory Visit: Payer: Medicaid Other | Admitting: Orthopaedic Surgery

## 2019-03-21 ENCOUNTER — Encounter: Payer: Self-pay | Admitting: Family

## 2019-03-22 ENCOUNTER — Ambulatory Visit: Payer: Medicaid Other | Admitting: Orthopaedic Surgery

## 2019-03-22 ENCOUNTER — Encounter: Payer: 59 | Attending: General Surgery | Admitting: Skilled Nursing Facility1

## 2019-03-22 DIAGNOSIS — Z90710 Acquired absence of both cervix and uterus: Secondary | ICD-10-CM | POA: Diagnosis not present

## 2019-03-22 DIAGNOSIS — G4730 Sleep apnea, unspecified: Secondary | ICD-10-CM | POA: Insufficient documentation

## 2019-03-22 DIAGNOSIS — Z8249 Family history of ischemic heart disease and other diseases of the circulatory system: Secondary | ICD-10-CM | POA: Insufficient documentation

## 2019-03-22 DIAGNOSIS — Z90722 Acquired absence of ovaries, bilateral: Secondary | ICD-10-CM | POA: Insufficient documentation

## 2019-03-22 DIAGNOSIS — F329 Major depressive disorder, single episode, unspecified: Secondary | ICD-10-CM | POA: Insufficient documentation

## 2019-03-22 DIAGNOSIS — I10 Essential (primary) hypertension: Secondary | ICD-10-CM | POA: Insufficient documentation

## 2019-03-22 DIAGNOSIS — Z880 Allergy status to penicillin: Secondary | ICD-10-CM | POA: Diagnosis not present

## 2019-03-22 DIAGNOSIS — Z6841 Body Mass Index (BMI) 40.0 and over, adult: Secondary | ICD-10-CM | POA: Diagnosis not present

## 2019-03-22 DIAGNOSIS — Z713 Dietary counseling and surveillance: Secondary | ICD-10-CM | POA: Diagnosis present

## 2019-03-22 DIAGNOSIS — Z7982 Long term (current) use of aspirin: Secondary | ICD-10-CM | POA: Diagnosis not present

## 2019-03-22 DIAGNOSIS — Z8261 Family history of arthritis: Secondary | ICD-10-CM | POA: Diagnosis not present

## 2019-03-22 DIAGNOSIS — M1990 Unspecified osteoarthritis, unspecified site: Secondary | ICD-10-CM | POA: Diagnosis not present

## 2019-03-22 DIAGNOSIS — Z79899 Other long term (current) drug therapy: Secondary | ICD-10-CM | POA: Insufficient documentation

## 2019-03-22 DIAGNOSIS — F419 Anxiety disorder, unspecified: Secondary | ICD-10-CM | POA: Insufficient documentation

## 2019-03-22 DIAGNOSIS — E669 Obesity, unspecified: Secondary | ICD-10-CM

## 2019-03-22 NOTE — Progress Notes (Signed)
Follow-up visit: Post-Operative sleeve Surgery   Primary concerns today: Post-operative Bariatric Surgery Nutrition Management.   Due to pts depressed feelings food will be more discussed at the next appt once she has spoken with a psychologist.  Pt states when she does not take her Wellbutrin she is irritable and aggressive. Pt states her daughter majored in psychology to she approaches her in a helpful way.  Pt states he has days where she does not bath and does not get out of bed.   Pt states she has been "escaping" with alcohol. Pt states she never contacted a phycologist because she thought she could do it on her own but since her daughter pointed out she was self medicating she states she will call a mental health professional.   Start Wt at [facility]: 346.5 Wt: video appt Surgery type: sleeve Surgery date: 02/15/2018   24-hr recall: 1 protein shake throughout the week (30g) B (AM): 1 morning star soy sausage and 1 egg (16g) or premade jimmy dean egg and bacon  Snk (AM): sugar free jello L (PM): tuna in a pack (14g) Snk (PM):  D (PM): ground [place] patty with 1/2c pinto beans with seasonings (28g) carrots or green beans or zucchini with salmon or chicken  Snk (PM): sugar free popcicle   Fluid intake: water, hot tea with stevia, sugar free lemonade, gatorade zero, decaf coffee with protein shake: averageing 70 oz Estimated total protein intake: 88g  Medications: see list Supplementation: bari adv multi chewable and calcium  Using straws: no Drinking while eating: no Having you been chewing well:no Chewing/swallowing difficulties: no Changes in vision: no Changes to mood/headaches: no Hair loss/Changes to skin/Changes to nails: no Any difficulty focusing or concentrating: no Sweating: no Dizziness/Lightheaded: no Palpitations: no  Carbonated beverages: no N/V/D/C/GAS: no Abdominal Pain: no Dumping syndrome: no  Recent physical activity:  A little walking and some chair  exercises   Progress Towards Goal(s):  In progress.     Intervention:  Nutrition counseling. Pts diet was advanced to the next phase now including non starchy veggies. Due to the bodies need for essential vitamins, minerals, and fats the pt was educated on the need to consume a certain amount of calories as well as certain nutrients daily. Pt was educated on the need for daily physical activity and to reach a goal of at least 150 minutes of moderate to vigorous physical activity as directed by their physician due to such benefits as increased musculature and improved lab values.   Goals:  Get a psychiatrist and a physchologist   Do bari support group: sent via email \ Teaching Method Utilized:  Visual Auditory Hands on  Barriers to learning/adherence to lifestyle change: none stated  Demonstrated degree of understanding via:  Teach Back   Monitoring/Evaluation:  Dietary intake, exercise, and body weight. Follow up in  6 weeks

## 2019-03-28 ENCOUNTER — Ambulatory Visit (INDEPENDENT_AMBULATORY_CARE_PROVIDER_SITE_OTHER): Payer: 59 | Admitting: Family

## 2019-03-28 ENCOUNTER — Other Ambulatory Visit: Payer: Self-pay

## 2019-03-28 ENCOUNTER — Encounter: Payer: Self-pay | Admitting: Family

## 2019-03-28 DIAGNOSIS — J019 Acute sinusitis, unspecified: Secondary | ICD-10-CM

## 2019-03-28 MED ORDER — AZITHROMYCIN 250 MG PO TABS: ORAL_TABLET | ORAL | 0 refills | Status: DC

## 2019-03-28 NOTE — Progress Notes (Signed)
Virtual Visit via Video Note  I connected with Jenelle Mages on 03/28/19 at  8:40 AM EST by a video enabled telemedicine application and verified that I am speaking with the correct person using two identifiers.  Location: Patient: home Provider: home   I discussed the limitations of evaluation and management by telemedicine and the availability of in person appointments. The patient expressed understanding and agreed to proceed.  History of Present Illness:  Patient is a 54 yr old female who presents today with chief complaint of congestion at the back of her throat.  Denies significant cough. Denies fever.   Reports some post nasal drip.  Reports that this started 1 month ago.  Feels like it is "all day long."  She reports associated loss of smell. Reports that her loss of smell has been present for about 2 weeks.  One week she had loss of smell, this week feels like sense of smell is too strong.  Denies sinus tenderness to palpation. Has a mild HA when she wakes up in the AM. She is working with Pulmonology re: her CPAP settings.   Past Medical History:  Diagnosis Date  . Anxiety   . Arthritis    osteoarthritis - knees  . Blood transfusion 1990   In [place], [place] with C/S Surgery - 5 units transfused  . Blood transfusion without reported diagnosis 1990   after a c/section  . Chest pain   . Chronic back pain   . Depression   . Fibroid   . Headache(784.0)    otc med prn  . Heart murmur   . Hypertension   . Low blood potassium   . Plantar fasciitis of left foot   . Sleep apnea    CPAP  dont hve cpap machine yet  . Vitamin D deficiency 09/06/2014     Social History   Socioeconomic History  . Marital status: Divorced    Spouse name: Not on file  . Number of children: Not on file  . Years of education: Not on file  . Highest education level: Not on file  Occupational History  . Not on file  Tobacco Use  . Smoking status: Never Smoker  . Smokeless tobacco: Never Used   Substance and Sexual Activity  . Alcohol use: No    Alcohol/week: 0.0 standard drinks  . Drug use: No  . Sexual activity: Not Currently    Partners: Male    Birth control/protection: Surgical    Comment: Tubal  Other Topics Concern  . Not on file  Social History Narrative   Divorced   Lives with son and grand-daughter   [place] (lives in [place])   [place] lives with patient   8- Daughter [name] (lives in [place])   Works at [employer]   Completed some college   Careers adviser   Social Determinants of Health   Financial Resource Strain:   . Difficulty of Paying Living Expenses: Not on file  Food Insecurity:   . Worried About Charity fundraiser in the Last Year: Not on file  . Ran Out of Food in the Last Year: Not on file  Transportation Needs:   . Lack of Transportation (Medical): Not on file  . Lack of Transportation (Non-Medical): Not on file  Physical Activity:   . Days of Exercise per Week: Not on file  . Minutes of Exercise per Session: Not on file  Stress:   . Feeling of Stress :  Not on file  Social Connections:   . Frequency of Communication with Friends and Family: Not on file  . Frequency of Social Gatherings with Friends and Family: Not on file  . Attends Religious Services: Not on file  . Active Member of Clubs or Organizations: Not on file  . Attends Archivist Meetings: Not on file  . Marital Status: Not on file  Intimate Partner Violence:   . Fear of Current or Ex-Partner: Not on file  . Emotionally Abused: Not on file  . Physically Abused: Not on file  . Sexually Abused: Not on file    Past Surgical History:  Procedure Laterality Date  . BILATERAL OOPHORECTOMY     Complete removal of left ovary and partial removal of right ovary.  . CESAREAN SECTION  1990   x 1  . COLONOSCOPY WITH PROPOFOL N/A 08/30/2015   Procedure: COLONOSCOPY WITH PROPOFOL;  Surgeon: Mauri Pole, MD;  Location:  [facility];  Service: Endoscopy;  Laterality: N/A;  . CYSTOSCOPY N/A 05/23/2015   Procedure: CYSTOSCOPY;  Surgeon: Bobbye Charleston, MD;  Location: [facility];  Service: Gynecology;  Laterality: N/A;  . gtastric sleeve     02-15-18 Dr. Excell Seltzer  . HERNIA REPAIR    . HIATAL HERNIA REPAIR N/A 02/15/2018   Procedure: HERNIA REPAIR HIATAL;  Surgeon: Excell Seltzer, MD;  Location: [facility];  Service: General;  Laterality: N/A;  . HYSTEROSCOPY WITH D & C N/A 01/11/2015   Procedure: DILATATION AND CURETTAGE /HYSTEROSCOPY;  Surgeon: Jerelyn Charles, MD;  Location: [facility];  Service: Gynecology;  Laterality: N/A;  . KNEE SURGERY     right knee  . LAPAROSCOPIC GASTRIC SLEEVE RESECTION N/A 02/15/2018   Procedure: LAPAROSCOPIC GASTRIC SLEEVE RESECTION WITH UPPER ENDO AND ERAS PATHWAY;  Surgeon: Excell Seltzer, MD;  Location: [facility];  Service: General;  Laterality: N/A;  . LIPOMA EXCISION Left 12/02/2016   Procedure: EXCISION LIPOMA OF LEFT SHOULDER;  Surgeon: Garald Balding, MD;  Location: [facility];  Service: Orthopedics;  Laterality: Left;  . ROBOTIC ASSISTED TOTAL HYSTERECTOMY WITH SALPINGECTOMY Bilateral 05/23/2015   Procedure: ROBOTIC ASSISTED TOTAL HYSTERECTOMY WITH SALPINGECTOMYwith right oophorectomy;  Surgeon: Bobbye Charleston, MD;  Location: [facility];  Service: Gynecology;  Laterality: Bilateral;  . TUBAL LIGATION  1990    Family History  Problem Relation Age of Onset  . Lung cancer Mother 31       dec  . Cancer Mother   . Hypertension Mother   . Arthritis Mother   . Cancer Father 51       prostate  . Hypertension Father   . Leukemia Sister   . Hypertension Sister   . Arthritis Sister   . Hyperlipidemia Sister   . Hypertension Brother   . Arthritis Brother   . Multiple sclerosis Sister   . Hypertension Sister   . Hypertension Sister   . Hypertension Brother   . Hypertension Brother   . Breast cancer Paternal Aunt   . Diabetes Maternal Grandmother   . Stroke Maternal Grandmother    . Heart attack Paternal Grandmother   . Asthma Other     Allergies  Allergen Reactions  . Penicillins Itching and Other (See Comments)    Has patient had a PCN reaction causing immediate rash, facial/tongue/throat swelling, SOB or lightheadedness with hypotension: No Has patient had a PCN reaction causing severe rash involving mucus membranes or skin necrosis: No Has patient had a PCN reaction that required hospitalization No Has patient had a  PCN reaction occurring within the last 10 years: No If all of the above answers are "NO", then may proceed with Cephalosporin use.     Current Outpatient Medications on File Prior to Visit  Medication Sig Dispense Refill  . buPROPion (WELLBUTRIN XL) 300 MG 24 hr tablet Take 1 tablet (300 mg total) by mouth daily. 30 tablet 2  . Ca Phosphate-Cholecalciferol (CALTRATE GUMMY BITES PO) Take by mouth.    . Cholecalciferol (VITAMIN D3) 50 MCG (2000 UT) CHEW Chew 8,000 Units by mouth daily.    Marland Kitchen lisinopril-hydrochlorothiazide (ZESTORETIC) 20-25 MG tablet Take 1 tablet by mouth once daily 90 tablet 0  . meloxicam (MOBIC) 7.5 MG tablet TAKE 1 TABLET BY MOUTH ONCE DAILY AS NEEDED FOR PAIN 90 tablet 0  . Menthol, Topical Analgesic, (BIOFREEZE) 4 % GEL Apply 1 application topically daily.    . multivitamin-iron-minerals-folic acid (CENTRUM) chewable tablet Chew 1 tablet by mouth daily.    Marland Kitchen venlafaxine XR (EFFEXOR-XR) 150 MG 24 hr capsule Take 1 capsule (150 mg total) by mouth daily with breakfast. 90 capsule 1  . [DISCONTINUED] LISINOPRIL PO Take by mouth.     No current facility-administered medications on file prior to visit.    LMP 10/02/2007 Comment: but had bleeding 18 months ago for short period of time and began again 08/2013      Observations/Objective:   Gen: Awake, alert, no acute distress Resp: Breathing is even and non-labored Psych: calm/pleasant demeanor Neuro: Alert and Oriented x 3, + facial symmetry, speech is  clear.   Assessment and Plan:  Sinusitis-history most consistent with sinusitis.  Will treat with a azithromycin due to penicillin allergy.  She is advised to call if symptoms worsen or if they fail to improve.  Follow Up Instructions:    I discussed the assessment and treatment plan with the patient. The patient was provided an opportunity to ask questions and all were answered. The patient agreed with the plan and demonstrated an understanding of the instructions.   The patient was advised to call back or seek an in-person evaluation if the symptoms worsen or if the condition fails to improve as anticipated.  Nance Pear, NP

## 2019-03-29 ENCOUNTER — Ambulatory Visit (INDEPENDENT_AMBULATORY_CARE_PROVIDER_SITE_OTHER): Payer: 59

## 2019-03-29 ENCOUNTER — Ambulatory Visit: Payer: Self-pay

## 2019-03-29 ENCOUNTER — Ambulatory Visit (INDEPENDENT_AMBULATORY_CARE_PROVIDER_SITE_OTHER): Payer: 59 | Admitting: Orthopaedic Surgery

## 2019-03-29 ENCOUNTER — Encounter: Payer: Self-pay | Admitting: Orthopaedic Surgery

## 2019-03-29 ENCOUNTER — Other Ambulatory Visit: Payer: Self-pay

## 2019-03-29 VITALS — Ht 64.0 in | Wt 275.8 lb

## 2019-03-29 DIAGNOSIS — M170 Bilateral primary osteoarthritis of knee: Secondary | ICD-10-CM | POA: Insufficient documentation

## 2019-03-29 DIAGNOSIS — G8929 Other chronic pain: Secondary | ICD-10-CM | POA: Diagnosis not present

## 2019-03-29 DIAGNOSIS — M25562 Pain in left knee: Secondary | ICD-10-CM

## 2019-03-29 DIAGNOSIS — M25561 Pain in right knee: Secondary | ICD-10-CM

## 2019-03-29 HISTORY — DX: Bilateral primary osteoarthritis of knee: M17.0

## 2019-03-29 MED ORDER — LIDOCAINE HCL 1 % IJ SOLN
2.0000 mL | INTRAMUSCULAR | Status: AC | PRN
  Administered 2019-03-29: 2 mL

## 2019-03-29 MED ORDER — BUPIVACAINE HCL 0.5 % IJ SOLN
2.0000 mL | INTRAMUSCULAR | Status: AC | PRN
  Administered 2019-03-29: 2 mL via INTRA_ARTICULAR

## 2019-03-29 MED ORDER — METHYLPREDNISOLONE ACETATE 40 MG/ML IJ SUSP
80.0000 mg | INTRAMUSCULAR | Status: AC | PRN
  Administered 2019-03-29: 80 mg via INTRA_ARTICULAR

## 2019-03-29 NOTE — Progress Notes (Signed)
Office Visit Note   Patient: Hailey Vance           Date of Birth: Aug 25, 1965           MRN: [id number] Visit Date: 03/29/2019              Requested by: Hailey Alar, NP [address] [place],  [place] [address] PCP: Hailey Alar, NP   Assessment & Plan: Visit Diagnoses:  1. Chronic pain of left knee   2. Chronic pain of right knee   3. Bilateral primary osteoarthritis of knee     Plan: End-stage osteoarthritis both knees.  There is bone-on-bone in the medial compartment bilaterally.  Long discussion with Hailey Vance regarding the above.  She has had a gastric sleeve procedure and has lost about 80 pounds but her BMI is still 47.  She have to lose another 50 to get her BMI in the operative range.  We have had a long discussion regarding that.  Today I am going to inject both knees with cortisone.  Follow-Up Instructions: Return if symptoms worsen or fail to improve.   Orders:  Orders Placed This Encounter  Procedures  . Large Joint Inj: bilateral knee  . XR KNEE 3 VIEW LEFT  . XR KNEE 3 VIEW RIGHT   No orders of the defined types were placed in this encounter.     Procedures: Large Joint Inj: bilateral knee on 03/29/2019 4:04 PM Indications: diagnostic evaluation Details: 25 G 1.5 in needle, anteromedial approach  Arthrogram: No  Medications (Right): 2 mL lidocaine 1 %; 2 mL bupivacaine 0.5 %; 80 mg methylPREDNISolone acetate 40 MG/ML Medications (Left): 2 mL lidocaine 1 %; 2 mL bupivacaine 0.5 %; 80 mg methylPREDNISolone acetate 40 MG/ML Consent was given by the patient. Immediately prior to procedure a time out was called to verify the correct patient, procedure, equipment, support staff and site/side marked as required. Patient was prepped and draped in the usual sterile fashion.       Clinical Data: No additional findings.   Subjective: Chief Complaint  Patient presents with  . Right Knee - Pain  . Left Knee - Pain  Patient  presents today with bilateral knee pain. She was last here in 2018 and was told she would eventually need surgery. She has since had surgery for a gastric sleeve and has lost some weight. Her left side is worse than the right. She takes over the counter medicine occasionally, but states that it does not help. Her pain is constant, but worse upon getting up. No previous knee surgery.  No shortness of breath or chest pain.  No complications relative to her gastric sleeve procedure HPI  Review of Systems   Objective: Vital Signs: Ht 5\' 4"  (1.626 m)   Wt 275 lb 12.8 oz (125.1 kg)   LMP 10/02/2007 Comment: but had bleeding 18 months ago for short period of time and began again 08/2013  BMI 47.34 kg/m   Physical Exam Constitutional:      Appearance: She is well-developed.  Eyes:     Pupils: Pupils are equal, round, and reactive to light.  Pulmonary:     Effort: Pulmonary effort is normal.  Skin:    General: Skin is warm and dry.  Neurological:     Mental Status: She is alert and oriented to person, place, and time.  Psychiatric:        Behavior: Behavior normal.     Ortho Exam large legs.  Difficult to determine joint space but more trouble on the medial compartment both knees than lateral.  Some patellar crepitation.  Full extension bilaterally and flexed about 90 degrees when the calf with touch her thigh.  Some swelling of both ankles.  Motor exam appears to be intact.  No instability of either knee.  No popliteal pain or mass Specialty Comments:  No specialty comments available.  Imaging: XR KNEE 3 VIEW LEFT  Result Date: 03/29/2019 Films of the left knee were obtained in several projections standing and compared to the right knee.  There are end-stage osteoarthritic changes in the left knee as well and similar to those on the right.  There is essentially bone-on-bone in the medial compartment with subchondral sclerosis and peripheral osteophytes.  Also changes in both lateral  compartment and patellofemoral joint.  No acute changes or ectopic calcification  XR KNEE 3 VIEW RIGHT  Result Date: 03/29/2019 Films of the right knee were obtained in several projections standing.  There are end-stage osteoarthritic changes in all 3 compartments but predominate in the medial compartment with there is bone-on-bone.  There are areas of subchondral sclerosis and osteophyte formation medially but also to a lesser extent in the lateral compartment and patellofemoral joint.  No ectopic calcification or fracture    PMFS History: Patient Active Problem List   Diagnosis Date Noted  . Bilateral primary osteoarthritis of knee 03/29/2019  . Morbid obesity ([facility]) 02/15/2018  . Lipoma of left shoulder 12/02/2016  . BMI 50.0-59.9, adult ([facility])   . Preventative health care 10/10/2014  . HTN (hypertension) 09/06/2014  . Osteoarthritis 09/06/2014  . Post-menopausal bleeding 09/06/2014  . OSA (obstructive sleep apnea) 09/06/2014  . Anxiety and depression 09/06/2014  . Vitamin D deficiency 09/06/2014  . Chronic back pain    Past Medical History:  Diagnosis Date  . Anxiety   . Arthritis    osteoarthritis - knees  . Blood transfusion 1990   In [place], [place] with C/S Surgery - 5 units transfused  . Blood transfusion without reported diagnosis 1990   after a c/section  . Chest pain   . Chronic back pain   . Depression   . Fibroid   . Headache(784.0)    otc med prn  . Heart murmur   . Hypertension   . Low blood potassium   . Plantar fasciitis of left foot   . Sleep apnea    CPAP  dont hve cpap machine yet  . Vitamin D deficiency 09/06/2014    Family History  Problem Relation Age of Onset  . Lung cancer Mother 72       dec  . Cancer Mother   . Hypertension Mother   . Arthritis Mother   . Cancer Father 39       prostate  . Hypertension Father   . Leukemia Sister   . Hypertension Sister   . Arthritis Sister   . Hyperlipidemia Sister   . Hypertension Brother   .  Arthritis Brother   . Multiple sclerosis Sister   . Hypertension Sister   . Hypertension Sister   . Hypertension Brother   . Hypertension Brother   . Breast cancer Paternal Aunt   . Diabetes Maternal Grandmother   . Stroke Maternal Grandmother   . Heart attack Paternal Grandmother   . Asthma Other     Past Surgical History:  Procedure Laterality Date  . BILATERAL OOPHORECTOMY     Complete removal of left ovary and partial removal of right  ovary.  . CESAREAN SECTION  1990   x 1  . COLONOSCOPY WITH PROPOFOL N/A 08/30/2015   Procedure: COLONOSCOPY WITH PROPOFOL;  Surgeon: Mauri Pole, MD;  Location: [facility];  Service: Endoscopy;  Laterality: N/A;  . CYSTOSCOPY N/A 05/23/2015   Procedure: CYSTOSCOPY;  Surgeon: Bobbye Charleston, MD;  Location: [facility];  Service: Gynecology;  Laterality: N/A;  . gtastric sleeve     02-15-18 Dr. Excell Seltzer  . HERNIA REPAIR    . HIATAL HERNIA REPAIR N/A 02/15/2018   Procedure: HERNIA REPAIR HIATAL;  Surgeon: Excell Seltzer, MD;  Location: [facility];  Service: General;  Laterality: N/A;  . HYSTEROSCOPY WITH D & C N/A 01/11/2015   Procedure: DILATATION AND CURETTAGE /HYSTEROSCOPY;  Surgeon: Jerelyn Charles, MD;  Location: [facility];  Service: Gynecology;  Laterality: N/A;  . KNEE SURGERY     right knee  . LAPAROSCOPIC GASTRIC SLEEVE RESECTION N/A 02/15/2018   Procedure: LAPAROSCOPIC GASTRIC SLEEVE RESECTION WITH UPPER ENDO AND ERAS PATHWAY;  Surgeon: Excell Seltzer, MD;  Location: [facility];  Service: General;  Laterality: N/A;  . LIPOMA EXCISION Left 12/02/2016   Procedure: EXCISION LIPOMA OF LEFT SHOULDER;  Surgeon: Garald Balding, MD;  Location: [facility];  Service: Orthopedics;  Laterality: Left;  . ROBOTIC ASSISTED TOTAL HYSTERECTOMY WITH SALPINGECTOMY Bilateral 05/23/2015   Procedure: ROBOTIC ASSISTED TOTAL HYSTERECTOMY WITH SALPINGECTOMYwith right oophorectomy;  Surgeon: Bobbye Charleston, MD;  Location: [facility];  Service: Gynecology;  Laterality:  Bilateral;  . TUBAL LIGATION  1990   Social History   Occupational History  . Not on file  Tobacco Use  . Smoking status: Never Smoker  . Smokeless tobacco: Never Used  Substance and Sexual Activity  . Alcohol use: No    Alcohol/week: 0.0 standard drinks  . Drug use: No  . Sexual activity: Not Currently    Partners: Male    Birth control/protection: Surgical    Comment: Tubal

## 2019-04-03 ENCOUNTER — Other Ambulatory Visit: Payer: Self-pay | Admitting: Family

## 2019-04-09 ENCOUNTER — Encounter: Payer: Self-pay | Admitting: Family

## 2019-04-11 ENCOUNTER — Ambulatory Visit (INDEPENDENT_AMBULATORY_CARE_PROVIDER_SITE_OTHER): Payer: 59 | Admitting: Family

## 2019-04-11 DIAGNOSIS — H9209 Otalgia, unspecified ear: Secondary | ICD-10-CM

## 2019-04-11 DIAGNOSIS — R6884 Jaw pain: Secondary | ICD-10-CM | POA: Diagnosis not present

## 2019-04-11 DIAGNOSIS — R0989 Other specified symptoms and signs involving the circulatory and respiratory systems: Secondary | ICD-10-CM | POA: Diagnosis not present

## 2019-04-11 DIAGNOSIS — R198 Other specified symptoms and signs involving the digestive system and abdomen: Secondary | ICD-10-CM

## 2019-04-11 MED ORDER — CYCLOBENZAPRINE HCL 5 MG PO TABS: 5.0000 mg | ORAL_TABLET | Freq: Every evening | ORAL | 0 refills | Status: DC | PRN

## 2019-04-11 MED ORDER — OMEPRAZOLE 40 MG PO CPDR: 40.0000 mg | DELAYED_RELEASE_CAPSULE | Freq: Every day | ORAL | 0 refills | Status: DC

## 2019-04-11 NOTE — Telephone Encounter (Signed)
Please contact pt to schedule  Virtual visit today.

## 2019-04-11 NOTE — Progress Notes (Signed)
Virtual Visit via Video Note  I connected with Hailey Vance on 04/11/19 at 12:00 PM EST by a video enabled telemedicine application and verified that I am speaking with the correct person using two identifiers.  Location: Patient: home Provider: home   I discussed the limitations of evaluation and management by telemedicine and the availability of in person appointments. The patient expressed understanding and agreed to proceed.  History of Present Illness:  Patient is a 54 yr old female who presents today with c/o bilateral ear pain and bilateral jaw pain. We treated her with azithromycin on 03/28/19 for sinusitis. She reports that sinus congestion is resolved.  Does have sensation "like something is stuck in my throat."     Reports that she had bilateral jaw pain.  Used a heating pad with some relief. Hurt all day yesterday. Today she has no pain, but feels some mild pressure between her ear and jaw.    Feels like something is stuck in her throat.  She reports that she started claritin.  Did not get mucinex because she could not find at the store.    Past Medical History:  Diagnosis Date  . Anxiety   . Arthritis    osteoarthritis - knees  . Blood transfusion 1990   In [place], [place] with C/S Surgery - 5 units transfused  . Blood transfusion without reported diagnosis 1990   after a c/section  . Chest pain   . Chronic back pain   . Depression   . Fibroid   . Headache(784.0)    otc med prn  . Heart murmur   . Hypertension   . Low blood potassium   . Plantar fasciitis of left foot   . Sleep apnea    CPAP  dont hve cpap machine yet  . Vitamin D deficiency 09/06/2014     Social History   Socioeconomic History  . Marital status: Divorced    Spouse name: Not on file  . Number of children: Not on file  . Years of education: Not on file  . Highest education level: Not on file  Occupational History  . Not on file  Tobacco Use  . Smoking status: Never Smoker  . Smokeless  tobacco: Never Used  Substance and Sexual Activity  . Alcohol use: No    Alcohol/week: 0.0 standard drinks  . Drug use: No  . Sexual activity: Not Currently    Partners: Male    Birth control/protection: Surgical    Comment: Tubal  Other Topics Concern  . Not on file  Social History Narrative   Divorced   Lives with son and grand-daughter   [place] (lives in [place])   [place] lives with patient   26- Daughter [name] (lives in [place])   Works at [employer]   Completed some college   Careers adviser   Social Determinants of Health   Financial Resource Strain:   . Difficulty of Paying Living Expenses: Not on file  Food Insecurity:   . Worried About Charity fundraiser in the Last Year: Not on file  . Ran Out of Food in the Last Year: Not on file  Transportation Needs:   . Lack of Transportation (Medical): Not on file  . Lack of Transportation (Non-Medical): Not on file  Physical Activity:   . Days of Exercise per Week: Not on file  . Minutes of Exercise per Session: Not on file  Stress:   . Feeling of  Stress : Not on file  Social Connections:   . Frequency of Communication with Friends and Family: Not on file  . Frequency of Social Gatherings with Friends and Family: Not on file  . Attends Religious Services: Not on file  . Active Member of Clubs or Organizations: Not on file  . Attends Archivist Meetings: Not on file  . Marital Status: Not on file  Intimate Partner Violence:   . Fear of Current or Ex-Partner: Not on file  . Emotionally Abused: Not on file  . Physically Abused: Not on file  . Sexually Abused: Not on file    Past Surgical History:  Procedure Laterality Date  . BILATERAL OOPHORECTOMY     Complete removal of left ovary and partial removal of right ovary.  . CESAREAN SECTION  1990   x 1  . COLONOSCOPY WITH PROPOFOL N/A 08/30/2015   Procedure: COLONOSCOPY WITH PROPOFOL;  Surgeon: Mauri Pole, MD;  Location: [facility];  Service: Endoscopy;  Laterality: N/A;  . CYSTOSCOPY N/A 05/23/2015   Procedure: CYSTOSCOPY;  Surgeon: Bobbye Charleston, MD;  Location: [facility];  Service: Gynecology;  Laterality: N/A;  . gtastric sleeve     02-15-18 Dr. Excell Seltzer  . HERNIA REPAIR    . HIATAL HERNIA REPAIR N/A 02/15/2018   Procedure: HERNIA REPAIR HIATAL;  Surgeon: Excell Seltzer, MD;  [facility]: [facility];  Service: General;  Laterality: N/A;  . HYSTEROSCOPY WITH D & C N/A 01/11/2015   Procedure: DILATATION AND CURETTAGE /HYSTEROSCOPY;  Surgeon: Jerelyn Charles, MD;  Location: [facility];  Service: Gynecology;  Laterality: N/A;  . KNEE SURGERY     right knee  . LAPAROSCOPIC GASTRIC SLEEVE RESECTION N/A 02/15/2018   Procedure: LAPAROSCOPIC GASTRIC SLEEVE RESECTION WITH UPPER ENDO AND ERAS PATHWAY;  Surgeon: Excell Seltzer, MD;  Location: [facility];  Service: General;  Laterality: N/A;  . LIPOMA EXCISION Left 12/02/2016   Procedure: EXCISION LIPOMA OF LEFT SHOULDER;  Surgeon: Garald Balding, MD;  Location: [facility];  Service: Orthopedics;  Laterality: Left;  . ROBOTIC ASSISTED TOTAL HYSTERECTOMY WITH SALPINGECTOMY Bilateral 05/23/2015   Procedure: ROBOTIC ASSISTED TOTAL HYSTERECTOMY WITH SALPINGECTOMYwith right oophorectomy;  Surgeon: Bobbye Charleston, MD;  Location: [facility];  Service: Gynecology;  Laterality: Bilateral;  . TUBAL LIGATION  1990    Family History  Problem Relation Age of Onset  . Lung cancer Mother 69       dec  . Cancer Mother   . Hypertension Mother   . Arthritis Mother   . Cancer Father 84       prostate  . Hypertension Father   . Leukemia Sister   . Hypertension Sister   . Arthritis Sister   . Hyperlipidemia Sister   . Hypertension Brother   . Arthritis Brother   . Multiple sclerosis Sister   . Hypertension Sister   . Hypertension Sister   . Hypertension Brother   . Hypertension Brother   . Breast cancer Paternal Aunt   . Diabetes Maternal Grandmother   .  Stroke Maternal Grandmother   . Heart attack Paternal Grandmother   . Asthma Other     Allergies  Allergen Reactions  . Penicillins Itching and Other (See Comments)    Has patient had a PCN reaction causing immediate rash, facial/tongue/throat swelling, SOB or lightheadedness with hypotension: No Has patient had a PCN reaction causing severe rash involving mucus membranes or skin necrosis: No Has patient had a PCN reaction that required hospitalization No Has patient  had a PCN reaction occurring within the last 10 years: No If all of the above answers are "NO", then may proceed with Cephalosporin use.     Current Outpatient Medications on File Prior to Visit  Medication Sig Dispense Refill  . azithromycin (ZITHROMAX) 250 MG tablet Take 2 tablets by mouth today, then one tablet once daily for 4 more days. 6 tablet 0  . buPROPion (WELLBUTRIN XL) 300 MG 24 hr tablet Take 1 tablet by mouth once daily 30 tablet 0  . Ca Phosphate-Cholecalciferol (CALTRATE GUMMY BITES PO) Take by mouth.    . Cholecalciferol (VITAMIN D3) 50 MCG (2000 UT) CHEW Chew 8,000 Units by mouth daily.    Marland Kitchen lisinopril-hydrochlorothiazide (ZESTORETIC) 20-25 MG tablet Take 1 tablet by mouth once daily 90 tablet 0  . meloxicam (MOBIC) 7.5 MG tablet TAKE 1 TABLET BY MOUTH ONCE DAILY AS NEEDED FOR PAIN 90 tablet 0  . Menthol, Topical Analgesic, (BIOFREEZE) 4 % GEL Apply 1 application topically daily.    . multivitamin-iron-minerals-folic acid (CENTRUM) chewable tablet Chew 1 tablet by mouth daily.    Marland Kitchen venlafaxine XR (EFFEXOR-XR) 150 MG 24 hr capsule Take 1 capsule (150 mg total) by mouth daily with breakfast. 90 capsule 1  . [DISCONTINUED] LISINOPRIL PO Take by mouth.     No current facility-administered medications on file prior to visit.    LMP 10/02/2007 Comment: but had bleeding 18 months ago for short period of time and began again 08/2013     Observations/Objective:   Gen: Awake, alert, no acute distress Resp:  Breathing is even and non-labored MS: able to fully open/close jaw without pain or clicking/popping Psych: calm/pleasant demeanor Neuro: Alert and Oriented x 3, + facial symmetry, speech is clear.   Assessment and Plan:  Jaw pain- overall improving, continue meloxicam.  Suspect TMJ syndrome.  Add flexeril HS, she will let me know if new symptoms/worsening symptoms. I think that her ear pain is referred pain from jaw most likely- improving. Will monitor. Discussed that she would need in office visit if symptoms worsen.  Globus sensation- trial of PPI.  Bilateral ear pain- ? Referred from jaw pain.  Seems to be improving.    Follow Up Instructions:    I discussed the assessment and treatment plan with the patient. The patient was provided an opportunity to ask questions and all were answered. The patient agreed with the plan and demonstrated an understanding of the instructions.   The patient was advised to call back or seek an in-person evaluation if the symptoms worsen or if the condition fails to improve as anticipated.  I provided 30 minutes of non-face-to-face time during this encounter.   Nance Pear, NP

## 2019-04-11 NOTE — Telephone Encounter (Signed)
Patient scheduled for today at 12 noon

## 2019-04-18 ENCOUNTER — Encounter: Payer: Self-pay | Admitting: Family

## 2019-04-18 NOTE — Telephone Encounter (Signed)
I phoned pt. She states she feels fine when she lays down but when she gets up and starts moving around she gets sweaty and nauseated.  I advised pt that she should be evaluated in the ED this evening to rule out cardiac etiology and she is agreeable to proceed.  She will have her daughter bring her.

## 2019-04-29 LAB — HM MAMMOGRAPHY

## 2019-05-02 ENCOUNTER — Other Ambulatory Visit: Payer: Self-pay

## 2019-05-03 ENCOUNTER — Encounter: Payer: Self-pay | Admitting: Family

## 2019-05-03 ENCOUNTER — Ambulatory Visit (INDEPENDENT_AMBULATORY_CARE_PROVIDER_SITE_OTHER): Payer: Medicare Other | Admitting: Family

## 2019-05-03 ENCOUNTER — Other Ambulatory Visit: Payer: Self-pay

## 2019-05-03 ENCOUNTER — Telehealth: Payer: Self-pay | Admitting: Family

## 2019-05-03 VITALS — BP 131/95 | HR 80 | Temp 96.6°F | Resp 16 | Ht 63.0 in | Wt 271.0 lb

## 2019-05-03 DIAGNOSIS — R0789 Other chest pain: Secondary | ICD-10-CM

## 2019-05-03 DIAGNOSIS — F32A Depression, unspecified: Secondary | ICD-10-CM

## 2019-05-03 DIAGNOSIS — F329 Major depressive disorder, single episode, unspecified: Secondary | ICD-10-CM

## 2019-05-03 DIAGNOSIS — R6884 Jaw pain: Secondary | ICD-10-CM

## 2019-05-03 DIAGNOSIS — I10 Essential (primary) hypertension: Secondary | ICD-10-CM

## 2019-05-03 MED ORDER — AMLODIPINE BESYLATE 2.5 MG PO TABS: 7.5000 mg | ORAL_TABLET | Freq: Every day | ORAL | 5 refills | Status: DC

## 2019-05-03 NOTE — Telephone Encounter (Signed)
Can you please contact [facility] on [address] and cancel amlodipine rx? I resent to another pharmacy at pt request.

## 2019-05-03 NOTE — Progress Notes (Signed)
Subjective:    Patient ID: Hailey Vance, female    DOB: 10-Oct-1965, 54 y.o.   MRN: [id number]  HPI   Patient is a 54 year old female who presents today for hospital follow-up.  She was seen in the hospital for chest pain. She reports that she was admitted overnight. She reports that she had a CTA which was reportedly negative for clot. She reports that cardiac work up included a stress test which was reportedly negative.    HTN- reports bp was very high upon admission.  She was started on amlodipine 5 mg once daily.  BP Readings from Last 3 Encounters:  05/03/19 (!) 131/95  10/19/18 139/89  05/17/18 131/81   She reports that since she returned home she has not had chest pain.  Jaw pain is improved but still sore.  R>L.    Depression- reports that she is staying with her daughter and granddaughter. This is a more peaceful situation.  Sons are living at her old house.  Still irritable at times.  She is maintained on wellbutrin and effexor.     She was seen at [facility] in [place].   Review of Systems    see HPI  Past Medical History:  Diagnosis Date  . Anxiety   . Arthritis    osteoarthritis - knees  . Blood transfusion 1990   In [place], [place] with C/S Surgery - 5 units transfused  . Blood transfusion without reported diagnosis 1990   after a c/section  . Chest pain   . Chronic back pain   . Depression   . Fibroid   . Headache(784.0)    otc med prn  . Heart murmur   . Hypertension   . Low blood potassium   . Plantar fasciitis of left foot   . Sleep apnea    CPAP  dont hve cpap machine yet  . Vitamin D deficiency 09/06/2014     Social History   Socioeconomic History  . Marital status: Divorced    Spouse name: Not on file  . Number of children: Not on file  . Years of education: Not on file  . Highest education level: Not on file  Occupational History  . Not on file  Tobacco Use  . Smoking status: Never Smoker  . Smokeless tobacco: Never Used  Substance  and Sexual Activity  . Alcohol use: No    Alcohol/week: 0.0 standard drinks  . Drug use: No  . Sexual activity: Not Currently    Partners: Male    Birth control/protection: Surgical    Comment: Tubal  Other Topics Concern  . Not on file  Social History Narrative   Divorced   Lives with son and grand-daughter   [place] (lives in [place])   [place] lives with patient   84- Daughter [name] (lives in [place])   Works at [employer]   Completed some college   Careers adviser   Social Determinants of Health   Financial Resource Strain:   . Difficulty of Paying Living Expenses: Not on file  Food Insecurity:   . Worried About Charity fundraiser in the Last Year: Not on file  . Ran Out of Food in the Last Year: Not on file  Transportation Needs:   . Lack of Transportation (Medical): Not on file  . Lack of Transportation (Non-Medical): Not on file  Physical Activity:   . Days of Exercise per Week: Not on file  . Minutes  of Exercise per Session: Not on file  Stress:   . Feeling of Stress : Not on file  Social Connections:   . Frequency of Communication with Friends and Family: Not on file  . Frequency of Social Gatherings with Friends and Family: Not on file  . Attends Religious Services: Not on file  . Active Member of Clubs or Organizations: Not on file  . Attends Archivist Meetings: Not on file  . Marital Status: Not on file  Intimate Partner Violence:   . Fear of Current or Ex-Partner: Not on file  . Emotionally Abused: Not on file  . Physically Abused: Not on file  . Sexually Abused: Not on file    Past Surgical History:  Procedure Laterality Date  . BILATERAL OOPHORECTOMY     Complete removal of left ovary and partial removal of right ovary.  . CESAREAN SECTION  1990   x 1  . COLONOSCOPY WITH PROPOFOL N/A 08/30/2015   Procedure: COLONOSCOPY WITH PROPOFOL;  Surgeon: Mauri Pole, MD;  Location: [facility];  Service: Endoscopy;  Laterality: N/A;  . CYSTOSCOPY N/A 05/23/2015   Procedure: CYSTOSCOPY;  Surgeon: Bobbye Charleston, MD;  Location: [facility];  Service: Gynecology;  Laterality: N/A;  . gtastric sleeve     02-15-18 Dr. Excell Seltzer  . HERNIA REPAIR    . HIATAL HERNIA REPAIR N/A 02/15/2018   Procedure: HERNIA REPAIR HIATAL;  Surgeon: Excell Seltzer, MD;  [facility];  Service: General;  Laterality: N/A;  . HYSTEROSCOPY WITH D & C N/A 01/11/2015   Procedure: DILATATION AND CURETTAGE /HYSTEROSCOPY;  Surgeon: Jerelyn Charles, MD;  Location: [facility];  Service: Gynecology;  Laterality: N/A;  . KNEE SURGERY     right knee  . LAPAROSCOPIC GASTRIC SLEEVE RESECTION N/A 02/15/2018   Procedure: LAPAROSCOPIC GASTRIC SLEEVE RESECTION WITH UPPER ENDO AND ERAS PATHWAY;  Surgeon: Excell Seltzer, MD;  [facility];  Service: General;  Laterality: N/A;  . LIPOMA EXCISION Left 12/02/2016   Procedure: EXCISION LIPOMA OF LEFT SHOULDER;  Surgeon: Garald Balding, MD;  Location: [facility];  Service: Orthopedics;  Laterality: Left;  . ROBOTIC ASSISTED TOTAL HYSTERECTOMY WITH SALPINGECTOMY Bilateral 05/23/2015   Procedure: ROBOTIC ASSISTED TOTAL HYSTERECTOMY WITH SALPINGECTOMYwith right oophorectomy;  Surgeon: Bobbye Charleston, MD;  Location: [facility];  Service: Gynecology;  Laterality: Bilateral;  . TUBAL LIGATION  1990    Family History  Problem Relation Age of Onset  . Lung cancer Mother 27       dec  . Cancer Mother   . Hypertension Mother   . Arthritis Mother   . Cancer Father 43       prostate  . Hypertension Father   . Leukemia Sister   . Hypertension Sister   . Arthritis Sister   . Hyperlipidemia Sister   . Hypertension Brother   . Arthritis Brother   . Multiple sclerosis Sister   . Hypertension Sister   . Hypertension Sister   . Hypertension Brother   . Hypertension Brother   . Breast cancer Paternal Aunt   . Diabetes Maternal Grandmother   . Stroke Maternal Grandmother   .  Heart attack Paternal Grandmother   . Asthma Other     Allergies  Allergen Reactions  . Penicillins Itching and Other (See Comments)    Has patient had a PCN reaction causing immediate rash, facial/tongue/throat swelling, SOB or lightheadedness with hypotension: No Has patient had a PCN reaction causing severe rash involving mucus membranes or skin  necrosis: No Has patient had a PCN reaction that required hospitalization No Has patient had a PCN reaction occurring within the last 10 years: No If all of the above answers are "NO", then may proceed with Cephalosporin use.     Current Outpatient Medications on File Prior to Visit  Medication Sig Dispense Refill  . amLODipine (NORVASC) 2.5 MG tablet Take 5 mg by mouth daily.    Marland Kitchen buPROPion (WELLBUTRIN XL) 300 MG 24 hr tablet Take 1 tablet by mouth once daily 30 tablet 0  . Ca Phosphate-Cholecalciferol (CALTRATE GUMMY BITES PO) Take by mouth.    . Cholecalciferol (VITAMIN D3) 50 MCG (2000 UT) CHEW Chew 8,000 Units by mouth daily.    . cyclobenzaprine (FLEXERIL) 5 MG tablet Take 1 tablet (5 mg total) by mouth at bedtime as needed for muscle spasms. 15 tablet 0  . lisinopril-hydrochlorothiazide (ZESTORETIC) 20-25 MG tablet Take 1 tablet by mouth once daily 90 tablet 0  . meloxicam (MOBIC) 7.5 MG tablet TAKE 1 TABLET BY MOUTH ONCE DAILY AS NEEDED FOR PAIN 90 tablet 0  . Menthol, Topical Analgesic, (BIOFREEZE) 4 % GEL Apply 1 application topically daily.    . multivitamin-iron-minerals-folic acid (CENTRUM) chewable tablet Chew 1 tablet by mouth daily.    Marland Kitchen omeprazole (PRILOSEC) 40 MG capsule Take 1 capsule (40 mg total) by mouth daily. 30 capsule 0  . venlafaxine XR (EFFEXOR-XR) 150 MG 24 hr capsule Take 1 capsule (150 mg total) by mouth daily with breakfast. 90 capsule 1  . [DISCONTINUED] LISINOPRIL PO Take by mouth.     No current facility-administered medications on file prior to visit.    BP (!) 131/95 (BP Location: Right Arm, Patient  Position: Sitting, Cuff Size: Large)   Pulse 80   Temp (!) 96.6 F (35.9 C) (Temporal)   Resp 16   Ht 5\' 3"  (1.6 m)   Wt 271 lb (122.9 kg)   LMP 10/02/2007 Comment: but had bleeding 18 months ago for short period of time and began again 08/2013  SpO2 99%   BMI 48.01 kg/m    Objective:   Physical Exam Constitutional:      Appearance: She is well-developed.  Cardiovascular:     Rate and Rhythm: Normal rate and regular rhythm.     Heart sounds: Normal heart sounds. No murmur.  Pulmonary:     Effort: Pulmonary effort is normal. No respiratory distress.     Breath sounds: Normal breath sounds. No wheezing.  Musculoskeletal:        General: No swelling.  Psychiatric:        Behavior: Behavior normal.        Thought Content: Thought content normal.        Judgment: Judgment normal.     Comments: Flat affect           Assessment & Plan:  HTN- dbp is still elevated.  Will increase amlodipine from 5mg  to 7.5mg  once daily.  Atypical chest pain- resolved. She reports negative inpatient evaluation. Will request records from her hospitalization at [facility].   Depression- uncontrolled.  Refer to psychiatry and counseling.  This visit occurred during the SARS-CoV-2 public health emergency.  Safety protocols were in place, including screening questions prior to the visit, additional usage of staff PPE, and extensive cleaning of exam room while observing appropriate contact time as indicated for disinfecting solutions.     Jaw pain- overall improving- likely TMJ- monitor.

## 2019-05-03 NOTE — Addendum Note (Signed)
Addended by: Debbrah Alar on: 05/03/2019 11:35 AM   Modules accepted: Orders

## 2019-05-03 NOTE — Patient Instructions (Addendum)
Please call [facility] to schedule an appointment with a counselor.  [phone] Please contact Dr. Toy Care to schedule an appointment for medication management:  [phone] Increase your amlodipine to 7.5mg  once daily (3 tabs).

## 2019-05-04 NOTE — Telephone Encounter (Signed)
rx cancelled at [facility]

## 2019-05-06 ENCOUNTER — Telehealth: Payer: Self-pay

## 2019-05-06 ENCOUNTER — Other Ambulatory Visit: Payer: Self-pay

## 2019-05-06 MED ORDER — AMLODIPINE BESYLATE 2.5 MG PO TABS: 7.5000 mg | ORAL_TABLET | Freq: Every day | ORAL | 5 refills | Status: DC

## 2019-05-06 NOTE — Telephone Encounter (Signed)
Patient called in to let NP Jenetta Downer' [place] know that she needs a prescription sent in for amLODipine (NORVASC) 3 MG tablet [id number]    To the Pharmacy at  Address: [address] [place], [place] [address]  Phone: [phone]   This prescription was sent to the wrong pharmacy on [facility]

## 2019-05-06 NOTE — Telephone Encounter (Signed)
rx sent

## 2019-05-13 ENCOUNTER — Other Ambulatory Visit: Payer: Self-pay | Admitting: Family

## 2019-05-31 ENCOUNTER — Other Ambulatory Visit: Payer: Self-pay

## 2019-06-01 ENCOUNTER — Encounter: Payer: Medicare Other | Admitting: Family

## 2019-06-01 ENCOUNTER — Ambulatory Visit: Payer: Medicare Other | Admitting: Orthopaedic Surgery

## 2019-06-14 ENCOUNTER — Other Ambulatory Visit: Payer: Self-pay

## 2019-06-15 ENCOUNTER — Encounter: Payer: Self-pay | Admitting: Orthopaedic Surgery

## 2019-06-15 ENCOUNTER — Ambulatory Visit (INDEPENDENT_AMBULATORY_CARE_PROVIDER_SITE_OTHER): Payer: Medicare Other | Admitting: Orthopaedic Surgery

## 2019-06-15 ENCOUNTER — Ambulatory Visit: Payer: Self-pay

## 2019-06-15 ENCOUNTER — Telehealth: Payer: Self-pay | Admitting: Family

## 2019-06-15 ENCOUNTER — Encounter: Payer: Self-pay | Admitting: Family

## 2019-06-15 ENCOUNTER — Ambulatory Visit (INDEPENDENT_AMBULATORY_CARE_PROVIDER_SITE_OTHER): Payer: Medicare Other | Admitting: Family

## 2019-06-15 VITALS — Ht 63.0 in | Wt 272.0 lb

## 2019-06-15 VITALS — BP 138/95 | HR 70 | Temp 97.6°F | Resp 16 | Ht 63.0 in | Wt 276.0 lb

## 2019-06-15 DIAGNOSIS — F329 Major depressive disorder, single episode, unspecified: Secondary | ICD-10-CM | POA: Diagnosis not present

## 2019-06-15 DIAGNOSIS — Z0000 Encounter for general adult medical examination without abnormal findings: Secondary | ICD-10-CM

## 2019-06-15 DIAGNOSIS — M160 Bilateral primary osteoarthritis of hip: Secondary | ICD-10-CM | POA: Insufficient documentation

## 2019-06-15 DIAGNOSIS — M545 Low back pain, unspecified: Secondary | ICD-10-CM | POA: Insufficient documentation

## 2019-06-15 DIAGNOSIS — M25551 Pain in right hip: Secondary | ICD-10-CM

## 2019-06-15 DIAGNOSIS — Z6841 Body Mass Index (BMI) 40.0 and over, adult: Secondary | ICD-10-CM | POA: Diagnosis not present

## 2019-06-15 DIAGNOSIS — M5441 Lumbago with sciatica, right side: Secondary | ICD-10-CM

## 2019-06-15 DIAGNOSIS — I10 Essential (primary) hypertension: Secondary | ICD-10-CM | POA: Diagnosis not present

## 2019-06-15 DIAGNOSIS — E2839 Other primary ovarian failure: Secondary | ICD-10-CM | POA: Diagnosis not present

## 2019-06-15 DIAGNOSIS — Z114 Encounter for screening for human immunodeficiency virus [HIV]: Secondary | ICD-10-CM

## 2019-06-15 DIAGNOSIS — R413 Other amnesia: Secondary | ICD-10-CM

## 2019-06-15 DIAGNOSIS — F32A Depression, unspecified: Secondary | ICD-10-CM

## 2019-06-15 DIAGNOSIS — G8929 Other chronic pain: Secondary | ICD-10-CM | POA: Diagnosis not present

## 2019-06-15 HISTORY — DX: Bilateral primary osteoarthritis of hip: M16.0

## 2019-06-15 LAB — TSH: TSH: 2.5 u[IU]/mL (ref 0.35–4.50)

## 2019-06-15 LAB — VITAMIN B12: Vitamin B-12: 298 pg/mL (ref 211–911)

## 2019-06-15 NOTE — Telephone Encounter (Signed)
Medical records request faxed over to [facility]

## 2019-06-15 NOTE — Progress Notes (Signed)
Subjective:    Patient ID: Hailey Vance, female    DOB: Jul 04, 1965, 54 y.o.   MRN: [id number]  HPI   Patient presents today for complete physical.  Immunizations:  tdap 2016, plans to get covid vaccine Diet: reports that her diet is fair, sometimes poor appetite Exercise:"not a lot of exercise."  Wt Readings from Last 3 Encounters:  06/15/19 276 lb (125.2 kg)  06/15/19 272 lb (123.4 kg)  05/03/19 271 lb (122.9 kg)    Colonoscopy: 2017 Dexa: has never had one Pap Smear: hysterectomy Mammogram: 2/21- Dr. Philis Pique.  Vision: up to date Dental: overdue  Memory loss- Reports that she often forgets conversations.  Sometimes she gets disorient and forgets where she is when driving.  States her daughter usually does the driving  Lab Results  Component Value Date   CHOL 195 10/10/2014   HDL 75.40 10/10/2014   LDLCALC 106 (H) 10/10/2014   TRIG 66.0 10/10/2014   CHOLHDL 3 10/10/2014        Past Medical History:  Diagnosis Date  . Anxiety   . Arthritis    osteoarthritis - knees  . Blood transfusion 1990   In [place], [place] with C/S Surgery - 5 units transfused  . Blood transfusion without reported diagnosis 1990   after a c/section  . Chest pain   . Chronic back pain   . Depression   . Fibroid   . Headache(784.0)    otc med prn  . Heart murmur   . Hypertension   . Low blood potassium   . Plantar fasciitis of left foot   . Sleep apnea    CPAP  dont hve cpap machine yet  . Vitamin D deficiency 09/06/2014     Social History   Socioeconomic History  . Marital status: Divorced    Spouse name: Not on file  . Number of children: Not on file  . Years of education: Not on file  . Highest education level: Not on file  Occupational History  . Not on file  Tobacco Use  . Smoking status: Never Smoker  . Smokeless tobacco: Never Used  Substance and Sexual Activity  . Alcohol use: No    Alcohol/week: 0.0 standard drinks  . Drug use: No  . Sexual activity: Not Currently      Partners: Male    Birth control/protection: Surgical    Comment: Tubal  Other Topics Concern  . Not on file  Social History Narrative   Divorced   Lives with son and grand-daughter   [place] (lives in [place])   [place] lives with patient   59- Daughter [name] (lives in [place])   Works at [employer]   Completed some college   Careers adviser   Social Determinants of Radio broadcast assistant Strain:   . Difficulty of Paying Living Expenses:   Food Insecurity:   . Worried About Charity fundraiser in the Last Year:   . Arboriculturist in the Last Year:   Transportation Needs:   . Film/video editor (Medical):   Marland Kitchen Lack of Transportation (Non-Medical):   Physical Activity:   . Days of Exercise per Week:   . Minutes of Exercise per Session:   Stress:   . Feeling of Stress :   Social Connections:   . Frequency of Communication with Friends and Family:   . Frequency of Social Gatherings with Friends and Family:   . Attends Religious  Services:   . Active Member of Clubs or Organizations:   . Attends Archivist Meetings:   Marland Kitchen Marital Status:   Intimate Partner Violence:   . Fear of Current or Ex-Partner:   . Emotionally Abused:   Marland Kitchen Physically Abused:   . Sexually Abused:     Past Surgical History:  Procedure Laterality Date  . BILATERAL OOPHORECTOMY     Complete removal of left ovary and partial removal of right ovary.  . CESAREAN SECTION  1990   x 1  . COLONOSCOPY WITH PROPOFOL N/A 08/30/2015   Procedure: COLONOSCOPY WITH PROPOFOL;  Surgeon: Mauri Pole, MD;  Location: [facility];  Service: Endoscopy;  Laterality: N/A;  . CYSTOSCOPY N/A 05/23/2015   Procedure: CYSTOSCOPY;  Surgeon: Bobbye Charleston, MD;  Location: [facility];  Service: Gynecology;  Laterality: N/A;  . gtastric sleeve     02-15-18 Dr. Excell Seltzer  . HERNIA REPAIR    . HIATAL HERNIA REPAIR N/A 02/15/2018   Procedure: HERNIA REPAIR  HIATAL;  Surgeon: Excell Seltzer, MD;  Location: [facility];  Service: General;  Laterality: N/A;  . HYSTEROSCOPY WITH D & C N/A 01/11/2015   Procedure: DILATATION AND CURETTAGE /HYSTEROSCOPY;  Surgeon: Jerelyn Charles, MD;  Location: [facility];  Service: Gynecology;  Laterality: N/A;  . KNEE SURGERY     right knee  . LAPAROSCOPIC GASTRIC SLEEVE RESECTION N/A 02/15/2018   Procedure: LAPAROSCOPIC GASTRIC SLEEVE RESECTION WITH UPPER ENDO AND ERAS PATHWAY;  Surgeon: Excell Seltzer, MD;  Location: [facility];  Service: General;  Laterality: N/A;  . LIPOMA EXCISION Left 12/02/2016   Procedure: EXCISION LIPOMA OF LEFT SHOULDER;  Surgeon: Garald Balding, MD;  Location: [facility];  Service: Orthopedics;  Laterality: Left;  . ROBOTIC ASSISTED TOTAL HYSTERECTOMY WITH SALPINGECTOMY Bilateral 05/23/2015   Procedure: ROBOTIC ASSISTED TOTAL HYSTERECTOMY WITH SALPINGECTOMYwith right oophorectomy;  Surgeon: Bobbye Charleston, MD;  Location: [facility];  Service: Gynecology;  Laterality: Bilateral;  . TUBAL LIGATION  1990    Family History  Problem Relation Age of Onset  . Lung cancer Mother 40       dec  . Cancer Mother   . Hypertension Mother   . Arthritis Mother   . Cancer Father 31       prostate  . Hypertension Father   . Leukemia Sister   . Hypertension Sister   . Arthritis Sister   . Hyperlipidemia Sister   . Hypertension Brother   . Arthritis Brother   . Multiple sclerosis Sister   . Hypertension Sister   . Hypertension Sister   . Hypertension Brother   . Hypertension Brother   . Breast cancer Paternal Aunt   . Diabetes Maternal Grandmother   . Stroke Maternal Grandmother   . Heart attack Paternal Grandmother   . Asthma Other     Allergies  Allergen Reactions  . Penicillins Itching and Other (See Comments)    Has patient had a PCN reaction causing immediate rash, facial/tongue/throat swelling, SOB or lightheadedness with hypotension: No Has patient had a PCN reaction causing severe rash  involving mucus membranes or skin necrosis: No Has patient had a PCN reaction that required hospitalization No Has patient had a PCN reaction occurring within the last 10 years: No If all of the above answers are "NO", then may proceed with Cephalosporin use.     Current Outpatient Medications on File Prior to Visit  Medication Sig Dispense Refill  . amLODipine (NORVASC) 2.5 MG tablet Take 3 tablets (7.5 mg  total) by mouth daily. Please call patient as soon as rx is ready 90 tablet 5  . buPROPion (WELLBUTRIN XL) 300 MG 24 hr tablet Take 1 tablet by mouth once daily 90 tablet 1  . Ca Phosphate-Cholecalciferol (CALTRATE GUMMY BITES PO) Take by mouth.    . Cholecalciferol (VITAMIN D3) 50 MCG (2000 UT) CHEW Chew 8,000 Units by mouth daily.    . cyclobenzaprine (FLEXERIL) 5 MG tablet Take 1 tablet (5 mg total) by mouth at bedtime as needed for muscle spasms. 15 tablet 0  . lisinopril-hydrochlorothiazide (ZESTORETIC) 20-25 MG tablet Take 1 tablet by mouth once daily 90 tablet 0  . meloxicam (MOBIC) 7.5 MG tablet TAKE 1 TABLET BY MOUTH ONCE DAILY AS NEEDED FOR PAIN 90 tablet 1  . Menthol, Topical Analgesic, (BIOFREEZE) 4 % GEL Apply 1 application topically daily.    . multivitamin-iron-minerals-folic acid (CENTRUM) chewable tablet Chew 1 tablet by mouth daily.    Marland Kitchen omeprazole (PRILOSEC) 40 MG capsule Take 1 capsule (40 mg total) by mouth daily. 30 capsule 0  . venlafaxine XR (EFFEXOR-XR) 150 MG 24 hr capsule TAKE 1 CAPSULE BY MOUTH ONCE DAILY WITH BREAKFAST 90 capsule 1  . [DISCONTINUED] LISINOPRIL PO Take by mouth.     No current facility-administered medications on file prior to visit.    BP (!) 138/95 (BP Location: Right Arm, Patient Position: Sitting, Cuff Size: Large)   Pulse 70   Temp 97.6 F (36.4 C) (Temporal)   Resp 16   Ht 5\' 3"  (1.6 m)   Wt 276 lb (125.2 kg)   LMP 10/02/2007 Comment: but had bleeding 18 months ago for short period of time and began again 08/2013  SpO2 99%   BMI  48.89 kg/m    Objective:   Physical Exam Constitutional:      Appearance: She is well-developed.  HENT:     Right Ear: Tympanic membrane normal.     Left Ear: Tympanic membrane normal.  Neck:     Thyroid: No thyromegaly.  Cardiovascular:     Rate and Rhythm: Normal rate and regular rhythm.     Heart sounds: Normal heart sounds. No murmur.  Pulmonary:     Effort: Pulmonary effort is normal. No respiratory distress.     Breath sounds: Normal breath sounds. No wheezing.  Abdominal:     General: There is no distension.     Palpations: Abdomen is soft. There is no mass.  Musculoskeletal:     Cervical back: Neck supple.  Skin:    General: Skin is warm and dry.  Neurological:     General: No focal deficit present.     Mental Status: She is alert and oriented to person, place, and time.     Cranial Nerves: No cranial nerve deficit.  Psychiatric:        Mood and Affect: Affect is flat.        Behavior: Behavior normal.        Thought Content: Thought content normal.        Judgment: Judgment normal.           Assessment & Plan:  Preventative care- obtain baseline bone density.  Mammo/colo up to date. Advised pt to schedule dental exam.    HTN- not taking lisinopril-hct.   Will restart and send me her daily readings in 1 week.  BP Readings from Last 3 Encounters:  06/15/19 (!) 138/95  05/03/19 (!) 131/95  10/19/18 139/89   Memory loss- RPR, TSH, B12- WNL.  Will add oral b12 supplement since it is low normal. Refer to neurology for neurocognitive testing.  Depression- uncontrolled. She has not contacted psychiatry. Reinforced importance of establishing with psych for ongoing med management.

## 2019-06-15 NOTE — Patient Instructions (Addendum)
Please schedule a routine dental exam. You will be contacted about your referral to neurology. Please contact psychiatry to schedule a new patient visit.  Complete lab work prior to leaving.  Restart Lisinopril hctz.

## 2019-06-15 NOTE — Progress Notes (Signed)
Office Visit Note   Patient: Hailey Vance           Date of Birth: Dec 10, 1965           MRN: [id number] Visit Date: 06/15/2019              Requested by: Hailey Alar, NP [place],  [place] [id number] PCP: Hailey Alar, NP   Assessment & Plan: Visit Diagnoses:  1. Pain in right hip   2. BMI 50.0-59.9, adult ([place])   3. Chronic bilateral low back pain with right-sided sciatica   4. Primary osteoarthritis of both hips     Plan: Hailey Vance has end-stage OA of both of her knees but has stabilized with her weight loss after gastric sleeve.  Her present BMI is approximately 48.  She has been complaining of right hip pain with x-rays consistent with degenerative arthritis.  She also has some degenerative changes in the lumbar spine but I think the predominant pain is related to her hip.  She does have some decreased range of motion with her pain on extremes.  I would like to try a course cortisone injection with Hailey Vance in the right hip and then monitor response.  If no improvement consider MRI scan of lumbar spine Follow-Up Instructions: Return in about 1 month (around 07/15/2019).   Orders:  Orders Placed This Encounter  Procedures  . XR Lumbar Spine 2-3 Views  . XR HIP UNILAT W OR W/O PELVIS 2-3 VIEWS RIGHT  . Ambulatory referral to Physical Medicine Rehab   No orders of the defined types were placed in this encounter.     Procedures: No procedures performed   Clinical Data: No additional findings.   Subjective: Chief Complaint  Patient presents with  . Right Hip - Pain  Patient presents today for right hip pain. She fell two years and states that it hurts on and off, but nothing to terrible. She fell again about a month ago and states that it is constantly hurting now. Her pain is located in her groin and radiates into her buttock and down her leg. She said that she has slight numbness and tingling, along with weakness in her right  leg. She does have lower back pain. She takes over the counter pain medicines. No previous lower back or hip surgery.  Has history of end-stage osteoarthritis both knees  HPI  Review of Systems   Objective: Vital Signs: Ht 5\' 3"  (1.6 m)   Wt 272 lb (123.4 kg)   LMP 10/02/2007 Comment: but had bleeding 18 months ago for short period of time and began again 08/2013  BMI 48.18 kg/m   Physical Exam Constitutional:      Appearance: She is well-developed.  Eyes:     Pupils: Pupils are equal, round, and reactive to light.  Pulmonary:     Effort: Pulmonary effort is normal.  Skin:    General: Skin is warm and dry.  Neurological:     Mental Status: She is alert and oriented to person, place, and time.  Psychiatric:        Behavior: Behavior normal.     Ortho Exam alert and oriented x3.  Comfortable sitting.  Does have an awkward gait related to the arthritis in both of her knees and her right hip.  She does have decreased range of motion of her right hip compared to the left with only about 15 to 20 degrees of internal and external  rotation and probably twice that amount without pain on the left.  Straight leg raise is negative.  Does have some burning and tingling in both of her feet.  Patient relates she is not diabetic.  Motor exam appears to be intact.  No percussible tenderness over lumbar spine  Specialty Comments:  No specialty comments available.  Imaging: XR HIP UNILAT W OR W/O PELVIS 2-3 VIEWS RIGHT  Result Date: 06/15/2019 AP the pelvis and lateral of the right hip are obtained.  There are degenerative changes bilaterally but more so in the right hip.  There is calcification around the capsule and narrowing of the joint space on the right.  There may be some loose bodies.  Small peripheral osteophytes in the femoral head.  Lesser changes of arthritis on the left which is not symptomatic  XR Lumbar Spine 2-3 Views  Result Date: 06/15/2019 Films of the lumbar spine were  obtained in several projections.  There is a very slight right degenerative scoliosis.  There is diffuse degenerative disc disease at L5-S1.  Slight anterior listhesis of L4 on 5.  Facet joint changes at L3-4 L4-5 and L5-S1.  Large abdominal panniculus and stool obliterates much of the detail    PMFS History: Patient Active Problem List   Diagnosis Date Noted  . Low back pain 06/15/2019  . Osteoarthritis of both hips 06/15/2019  . Bilateral primary osteoarthritis of knee 03/29/2019  . Morbid obesity ([place]) 02/15/2018  . Lipoma of left shoulder 12/02/2016  . BMI 50.0-59.9, adult ([place])   . Preventative health care 10/10/2014  . HTN (hypertension) 09/06/2014  . Osteoarthritis 09/06/2014  . Post-menopausal bleeding 09/06/2014  . OSA (obstructive sleep apnea) 09/06/2014  . Anxiety and depression 09/06/2014  . Vitamin D deficiency 09/06/2014  . Chronic back pain    Past Medical History:  Diagnosis Date  . Anxiety   . Arthritis    osteoarthritis - knees  . Blood transfusion 1990   In [place], [place] with C/S Surgery - 5 units transfused  . Blood transfusion without reported diagnosis 1990   after a c/section  . Chest pain   . Chronic back pain   . Depression   . Fibroid   . Headache(784.0)    otc med prn  . Heart murmur   . Hypertension   . Low blood potassium   . Plantar fasciitis of left foot   . Sleep apnea    CPAP  dont hve cpap machine yet  . Vitamin D deficiency 09/06/2014    Family History  Problem Relation Age of Onset  . Lung cancer Mother 69       dec  . Cancer Mother   . Hypertension Mother   . Arthritis Mother   . Cancer Father 21       prostate  . Hypertension Father   . Leukemia Sister   . Hypertension Sister   . Arthritis Sister   . Hyperlipidemia Sister   . Hypertension Brother   . Arthritis Brother   . Multiple sclerosis Sister   . Hypertension Sister   . Hypertension Sister   . Hypertension Brother   . Hypertension Brother   . Breast cancer  Paternal Aunt   . Diabetes Maternal Grandmother   . Stroke Maternal Grandmother   . Heart attack Paternal Grandmother   . Asthma Other     Past Surgical History:  Procedure Laterality Date  . BILATERAL OOPHORECTOMY     Complete removal of left ovary and partial removal  of right ovary.  . CESAREAN SECTION  1990   x 1  . COLONOSCOPY WITH PROPOFOL N/A 08/30/2015   Procedure: COLONOSCOPY WITH PROPOFOL;  Surgeon: Mauri Pole, MD;  Location: [facility];  Service: Endoscopy;  Laterality: N/A;  . CYSTOSCOPY N/A 05/23/2015   Procedure: CYSTOSCOPY;  Surgeon: Bobbye Charleston, MD;  Location: [facility];  Service: Gynecology;  Laterality: N/A;  . gtastric sleeve     02-15-18 Dr. Excell Seltzer  . HERNIA REPAIR    . HIATAL HERNIA REPAIR N/A 02/15/2018   Procedure: HERNIA REPAIR HIATAL;  Surgeon: Excell Seltzer, MD;  Location: [facility];  Service: General;  Laterality: N/A;  . HYSTEROSCOPY WITH D & C N/A 01/11/2015   Procedure: DILATATION AND CURETTAGE /HYSTEROSCOPY;  Surgeon: Jerelyn Charles, MD;  Location: [facility];  Service: Gynecology;  Laterality: N/A;  . KNEE SURGERY     right knee  . LAPAROSCOPIC GASTRIC SLEEVE RESECTION N/A 02/15/2018   Procedure: LAPAROSCOPIC GASTRIC SLEEVE RESECTION WITH UPPER ENDO AND ERAS PATHWAY;  Surgeon: Excell Seltzer, MD;  Location: [facility];  Service: General;  Laterality: N/A;  . LIPOMA EXCISION Left 12/02/2016   Procedure: EXCISION LIPOMA OF LEFT SHOULDER;  Surgeon: Garald Balding, MD;  Location: [facility];  Service: Orthopedics;  Laterality: Left;  . ROBOTIC ASSISTED TOTAL HYSTERECTOMY WITH SALPINGECTOMY Bilateral 05/23/2015   Procedure: ROBOTIC ASSISTED TOTAL HYSTERECTOMY WITH SALPINGECTOMYwith right oophorectomy;  Surgeon: Bobbye Charleston, MD;  Location: [facility];  Service: Gynecology;  Laterality: Bilateral;  . TUBAL LIGATION  1990   Social History   Occupational History  . Not on file  Tobacco Use  . Smoking status: Never Smoker  . Smokeless tobacco: Never  Used  Substance and Sexual Activity  . Alcohol use: No    Alcohol/week: 0.0 standard drinks  . Drug use: No  . Sexual activity: Not Currently    Partners: Male    Birth control/protection: Surgical    Comment: Tubal

## 2019-06-15 NOTE — Telephone Encounter (Signed)
Please contact Dr. Malachi Carl office to request copy of pap/mammo.

## 2019-06-16 ENCOUNTER — Telehealth: Payer: Self-pay | Admitting: Family

## 2019-06-16 LAB — HIV ANTIBODY (ROUTINE TESTING W REFLEX): HIV 1&2 Ab, 4th Generation: NONREACTIVE

## 2019-06-16 LAB — RPR: RPR Ser Ql: NONREACTIVE

## 2019-06-16 MED ORDER — B-12 500 MCG PO TABS: 1.0000 | ORAL_TABLET | Freq: Every day | ORAL | Status: DC

## 2019-06-16 NOTE — Telephone Encounter (Signed)
Called but no answer and mailbox was full, will try again later.

## 2019-06-16 NOTE — Telephone Encounter (Signed)
Advised patient of results and provider's recommendations.

## 2019-06-16 NOTE — Telephone Encounter (Signed)
Please advise pt b12 level low normal. I would like her to add b12 vitamin otc 579mcg PO daily.  Other tests hiv, syphilis screen are negative. Thyroid test normal.

## 2019-06-16 NOTE — Telephone Encounter (Signed)
Caller : Crystl Lillard  Call Back # [phone]  Patient calling in regards to lab results

## 2019-07-01 ENCOUNTER — Ambulatory Visit (INDEPENDENT_AMBULATORY_CARE_PROVIDER_SITE_OTHER): Payer: Medicare Other | Admitting: Physical Medicine and Rehabilitation

## 2019-07-01 ENCOUNTER — Ambulatory Visit: Payer: Self-pay

## 2019-07-01 ENCOUNTER — Encounter: Payer: Self-pay | Admitting: Physical Medicine and Rehabilitation

## 2019-07-01 ENCOUNTER — Ambulatory Visit (HOSPITAL_BASED_OUTPATIENT_CLINIC_OR_DEPARTMENT_OTHER)
Admission: RE | Admit: 2019-07-01 | Discharge: 2019-07-01 | Disposition: A | Discharge status: Home / Self Care | Payer: Medicare Other | Source: Ambulatory Visit | Attending: Family | Admitting: Family

## 2019-07-01 ENCOUNTER — Other Ambulatory Visit: Payer: Self-pay

## 2019-07-01 DIAGNOSIS — M25551 Pain in right hip: Secondary | ICD-10-CM

## 2019-07-01 DIAGNOSIS — E2839 Other primary ovarian failure: Secondary | ICD-10-CM

## 2019-07-01 DIAGNOSIS — Z13820 Encounter for screening for osteoporosis: Secondary | ICD-10-CM | POA: Diagnosis not present

## 2019-07-01 DIAGNOSIS — M160 Bilateral primary osteoarthritis of hip: Secondary | ICD-10-CM

## 2019-07-01 MED ORDER — TRIAMCINOLONE ACETONIDE 40 MG/ML IJ SUSP
60.0000 mg | INTRAMUSCULAR | Status: AC | PRN
  Administered 2019-07-01: 60 mg via INTRA_ARTICULAR

## 2019-07-01 MED ORDER — BUPIVACAINE HCL 0.25 % IJ SOLN
4.0000 mL | INTRAMUSCULAR | Status: AC | PRN
  Administered 2019-07-01: 4 mL via INTRA_ARTICULAR

## 2019-07-01 NOTE — Progress Notes (Signed)
  Numeric Pain Rating Scale and Functional Assessment Average Pain 8   In the last MONTH (on 0-10 scale) has pain interfered with the following?  1. General activity like being  able to carry out your everyday physical activities such as walking, climbing stairs, carrying groceries, or moving a chair?  Rating(7)   -Dye Allergies.  

## 2019-07-01 NOTE — Progress Notes (Signed)
   Hailey Vance - 54 y.o. female MRN [id number]  Date of birth: 05-16-1965  Office Visit Note: Visit Date: 07/01/2019 PCP: Debbrah Alar, NP Referred by: Debbrah Alar, NP  Subjective: Chief Complaint  Patient presents with  . Right Hip - Pain   HPI:  Hailey Vance is a 54 y.o. female who comes in today At the request of Dr. Joni Fears.for planned Right anesthetic hip arthrogram with fluoroscopic guidance.  The patient has failed conservative care including home exercise, medications, time and activity modification.  This injection will be diagnostic and hopefully therapeutic.  Please see requesting physician notes for further details and justification.  ROS Otherwise per HPI.  Assessment & Plan: Visit Diagnoses:  1. Osteoarthritis of both hips, unspecified osteoarthritis type   2. Pain in right hip     Plan: No additional findings.   Meds & Orders: No orders of the defined types were placed in this encounter.   Orders Placed This Encounter  Procedures  . Large Joint Inj  . XR C-ARM NO REPORT    Follow-up: No follow-ups on file.   Procedures: Large Joint Inj: R hip joint on 07/01/2019 11:28 AM Indications: pain and diagnostic evaluation Details: 22 G needle, anterior approach  Arthrogram: Yes  Medications: 4 mL bupivacaine 0.25 %; 60 mg triamcinolone acetonide 40 MG/ML Outcome: tolerated well, no immediate complications  Arthrogram demonstrated excellent flow of contrast throughout the joint surface without extravasation or obvious defect.  The patient had MILD relief of symptoms during the anesthetic phase of the injection.  Procedure, treatment alternatives, risks and benefits explained, specific risks discussed. Consent was given by the patient. Immediately prior to procedure a time out was called to verify the correct patient, procedure, equipment, support staff and site/side marked as required. Patient was prepped and draped in the usual sterile fashion.        No notes on file   Clinical History: No specialty comments available.     Objective:  VS:  HT:    WT:   BMI:     BP:   HR: bpm  TEMP: ( )  RESP:  Physical Exam  Ortho Exam Imaging: No results found.

## 2019-07-08 ENCOUNTER — Encounter: Payer: Self-pay | Admitting: Family

## 2019-07-08 NOTE — Telephone Encounter (Signed)
Given her right sided weakness I would advise that she go to the ED.

## 2019-07-13 ENCOUNTER — Ambulatory Visit: Payer: Medicare Other | Admitting: Family

## 2019-07-15 ENCOUNTER — Other Ambulatory Visit: Payer: Self-pay

## 2019-07-15 ENCOUNTER — Ambulatory Visit (INDEPENDENT_AMBULATORY_CARE_PROVIDER_SITE_OTHER): Payer: Medicare Other | Admitting: Family

## 2019-07-15 ENCOUNTER — Encounter: Payer: Self-pay | Admitting: Family

## 2019-07-15 VITALS — BP 128/76 | HR 79 | Temp 97.5°F | Resp 16 | Ht 63.0 in | Wt 279.0 lb

## 2019-07-15 DIAGNOSIS — F32A Depression, unspecified: Secondary | ICD-10-CM

## 2019-07-15 DIAGNOSIS — K219 Gastro-esophageal reflux disease without esophagitis: Secondary | ICD-10-CM | POA: Diagnosis not present

## 2019-07-15 DIAGNOSIS — E785 Hyperlipidemia, unspecified: Secondary | ICD-10-CM | POA: Diagnosis not present

## 2019-07-15 DIAGNOSIS — E538 Deficiency of other specified B group vitamins: Secondary | ICD-10-CM | POA: Diagnosis not present

## 2019-07-15 DIAGNOSIS — F419 Anxiety disorder, unspecified: Secondary | ICD-10-CM

## 2019-07-15 DIAGNOSIS — F329 Major depressive disorder, single episode, unspecified: Secondary | ICD-10-CM

## 2019-07-15 DIAGNOSIS — I10 Essential (primary) hypertension: Secondary | ICD-10-CM | POA: Diagnosis not present

## 2019-07-15 LAB — BASIC METABOLIC PANEL
BUN: 15 mg/dL (ref 6–23)
CO2: 33 mEq/L — ABNORMAL HIGH (ref 19–32)
Calcium: 8.9 mg/dL (ref 8.4–10.5)
Chloride: 101 mEq/L (ref 96–112)
Creatinine, Ser: 0.98 mg/dL (ref 0.40–1.20)
GFR: 71.19 mL/min (ref >60.00)
Glucose, Bld: 80 mg/dL (ref 70–99)
Potassium: 4 mEq/L (ref 3.5–5.1)
Sodium: 138 mEq/L (ref 135–145)

## 2019-07-15 LAB — LIPID PANEL
Cholesterol: 201 mg/dL — ABNORMAL HIGH (ref 0–200)
HDL: 85.4 mg/dL (ref >39.00)
LDL Cholesterol: 98 mg/dL (ref 0–99)
NonHDL: 115.7
Total CHOL/HDL Ratio: 2
Triglycerides: 90 mg/dL (ref 0.0–149.0)
VLDL: 18 mg/dL (ref 0.0–40.0)

## 2019-07-15 LAB — VITAMIN B12: Vitamin B-12: 956 pg/mL — ABNORMAL HIGH (ref 211–911)

## 2019-07-15 MED ORDER — LISINOPRIL-HYDROCHLOROTHIAZIDE 20-25 MG PO TABS: ORAL_TABLET | ORAL | 1 refills | Status: DC

## 2019-07-15 NOTE — Progress Notes (Signed)
Subjective:    Patient ID: Hailey Vance, female    DOB: 02-Jan-1966, 54 y.o.   MRN: [id number]  HPI  Patient is a 54 yr old female who presents today for follow up.  Hyperlipidemia- not on statin.  Lab Results  Component Value Date   CHOL 195 10/10/2014   HDL 75.40 10/10/2014   LDLCALC 106 (H) 10/10/2014   TRIG 66.0 10/10/2014   CHOLHDL 3 10/10/2014   Depression- reports depression is unchanged. States she keeps putting off establishing with a psychiatrist but states she is open to the idea.   Memory loss- last visit we checked RPR, TSH, B12.  B12 was low normal. TSH/RPR were normal. We added an oral b12 supplement and referred the patient to neurocognitive testing. Per chart review- they reached out to the patient but could not get in touch with her.   HTN- maintained on lisinopril-hct.  BP Readings from Last 3 Encounters:  07/15/19 128/76  06/15/19 (!) 138/95  05/03/19 (!) 131/95   Depression- continues wellbutrin and effexor.  She has not contacted psychiatry.  GERD- maintained on omeprazole.   Review of Systems See HPI  Past Medical History:  Diagnosis Date  . Anxiety   . Arthritis    osteoarthritis - knees  . Blood transfusion 1990   In [place], [place] with C/S Surgery - 5 units transfused  . Blood transfusion without reported diagnosis 1990   after a c/section  . Chest pain   . Chronic back pain   . Depression   . Fibroid   . Headache(784.0)    otc med prn  . Heart murmur   . Hypertension   . Low blood potassium   . Plantar fasciitis of left foot   . Sleep apnea    CPAP  dont hve cpap machine yet  . Vitamin D deficiency 09/06/2014     Social History   Socioeconomic History  . Marital status: Divorced    Spouse name: Not on file  . Number of children: Not on file  . Years of education: Not on file  . Highest education level: Not on file  Occupational History  . Not on file  Tobacco Use  . Smoking status: Never Smoker  . Smokeless tobacco: Never  Used  Substance and Sexual Activity  . Alcohol use: No    Alcohol/week: 0.0 standard drinks  . Drug use: No  . Sexual activity: Not Currently    Partners: Male    Birth control/protection: Surgical    Comment: Tubal  Other Topics Concern  . Not on file  Social History Narrative   Divorced   Lives with son and grand-daughter   [place] (lives in [place])   [place] lives with patient   53- Daughter [name] (lives in [place])   Works at [employer]   Completed some college   Careers adviser   Social Determinants of Radio broadcast assistant Strain:   . Difficulty of Paying Living Expenses:   Food Insecurity:   . Worried About Charity fundraiser in the Last Year:   . Arboriculturist in the Last Year:   Transportation Needs:   . Film/video editor (Medical):   Marland Kitchen Lack of Transportation (Non-Medical):   Physical Activity:   . Days of Exercise per Week:   . Minutes of Exercise per Session:   Stress:   . Feeling of Stress :   Social Connections:   .  Frequency of Communication with Friends and Family:   . Frequency of Social Gatherings with Friends and Family:   . Attends Religious Services:   . Active Member of Clubs or Organizations:   . Attends Archivist Meetings:   Marland Kitchen Marital Status:   Intimate Partner Violence:   . Fear of Current or Ex-Partner:   . Emotionally Abused:   Marland Kitchen Physically Abused:   . Sexually Abused:     Past Surgical History:  Procedure Laterality Date  . BILATERAL OOPHORECTOMY     Complete removal of left ovary and partial removal of right ovary.  . CESAREAN SECTION  1990   x 1  . COLONOSCOPY WITH PROPOFOL N/A 08/30/2015   Procedure: COLONOSCOPY WITH PROPOFOL;  Surgeon: Mauri Pole, MD;  Location: [facility];  Service: Endoscopy;  Laterality: N/A;  . CYSTOSCOPY N/A 05/23/2015   Procedure: CYSTOSCOPY;  Surgeon: Bobbye Charleston, MD;  Location: [facility];  Service: Gynecology;   Laterality: N/A;  . gtastric sleeve     02-15-18 Dr. Excell Seltzer  . HERNIA REPAIR    . HIATAL HERNIA REPAIR N/A 02/15/2018   Procedure: HERNIA REPAIR HIATAL;  Surgeon: Excell Seltzer, MD;  Location: [facility];  Service: General;  Laterality: N/A;  . HYSTEROSCOPY WITH D & C N/A 01/11/2015   Procedure: DILATATION AND CURETTAGE /HYSTEROSCOPY;  Surgeon: Jerelyn Charles, MD;  Location: [facility];  Service: Gynecology;  Laterality: N/A;  . KNEE SURGERY     right knee  . LAPAROSCOPIC GASTRIC SLEEVE RESECTION N/A 02/15/2018   Procedure: LAPAROSCOPIC GASTRIC SLEEVE RESECTION WITH UPPER ENDO AND ERAS PATHWAY;  Surgeon: Excell Seltzer, MD;  Location: [facility];  Service: General;  Laterality: N/A;  . LIPOMA EXCISION Left 12/02/2016   Procedure: EXCISION LIPOMA OF LEFT SHOULDER;  Surgeon: Garald Balding, MD;  Location: [facility];  Service: Orthopedics;  Laterality: Left;  . ROBOTIC ASSISTED TOTAL HYSTERECTOMY WITH SALPINGECTOMY Bilateral 05/23/2015   Procedure: ROBOTIC ASSISTED TOTAL HYSTERECTOMY WITH SALPINGECTOMYwith right oophorectomy;  Surgeon: Bobbye Charleston, MD;  Location: [facility];  Service: Gynecology;  Laterality: Bilateral;  . TUBAL LIGATION  1990    Family History  Problem Relation Age of Onset  . Lung cancer Mother 36       dec  . Cancer Mother   . Hypertension Mother   . Arthritis Mother   . Cancer Father 78       prostate  . Hypertension Father   . Leukemia Sister   . Hypertension Sister   . Arthritis Sister   . Hyperlipidemia Sister   . Hypertension Brother   . Arthritis Brother   . Multiple sclerosis Sister   . Hypertension Sister   . Hypertension Sister   . Hypertension Brother   . Hypertension Brother   . Breast cancer Paternal Aunt   . Diabetes Maternal Grandmother   . Stroke Maternal Grandmother   . Heart attack Paternal Grandmother   . Asthma Other     Allergies  Allergen Reactions  . Penicillins Itching and Other (See Comments)    Has patient had a PCN reaction  causing immediate rash, facial/tongue/throat swelling, SOB or lightheadedness with hypotension: No Has patient had a PCN reaction causing severe rash involving mucus membranes or skin necrosis: No Has patient had a PCN reaction that required hospitalization No Has patient had a PCN reaction occurring within the last 10 years: No If all of the above answers are "NO", then may proceed with Cephalosporin use.     Current Outpatient  Medications on File Prior to Visit  Medication Sig Dispense Refill  . amLODipine (NORVASC) 2.5 MG tablet Take 3 tablets (7.5 mg total) by mouth daily. Please call patient as soon as rx is ready 90 tablet 5  . buPROPion (WELLBUTRIN XL) 300 MG 24 hr tablet Take 1 tablet by mouth once daily 90 tablet 1  . Ca Phosphate-Cholecalciferol (CALTRATE GUMMY BITES PO) Take by mouth.    . Cholecalciferol (VITAMIN D3) 50 MCG (2000 UT) CHEW Chew 8,000 Units by mouth daily.    . Cyanocobalamin (B-12) 500 MCG TABS Take 1 tablet by mouth daily. 30 tablet   . cyclobenzaprine (FLEXERIL) 5 MG tablet Take 1 tablet (5 mg total) by mouth at bedtime as needed for muscle spasms. 15 tablet 0  . lisinopril-hydrochlorothiazide (ZESTORETIC) 20-25 MG tablet Take 1 tablet by mouth once daily 90 tablet 0  . meloxicam (MOBIC) 7.5 MG tablet TAKE 1 TABLET BY MOUTH ONCE DAILY AS NEEDED FOR PAIN 90 tablet 1  . Menthol, Topical Analgesic, (BIOFREEZE) 4 % GEL Apply 1 application topically daily.    . multivitamin-iron-minerals-folic acid (CENTRUM) chewable tablet Chew 1 tablet by mouth daily.    Marland Kitchen omeprazole (PRILOSEC) 40 MG capsule Take 1 capsule (40 mg total) by mouth daily. 30 capsule 0  . venlafaxine XR (EFFEXOR-XR) 150 MG 24 hr capsule TAKE 1 CAPSULE BY MOUTH ONCE DAILY WITH BREAKFAST 90 capsule 1  . [DISCONTINUED] LISINOPRIL PO Take by mouth.     No current facility-administered medications on file prior to visit.    BP 128/76 (BP Location: Right Arm, Patient Position: Sitting, Cuff Size: Large)    Pulse 79   Temp (!) 97.5 F (36.4 C) (Temporal)   Resp 16   Ht 5\' 3"  (1.6 m)   Wt 279 lb (126.6 kg)   LMP 10/02/2007 Comment: but had bleeding 18 months ago for short period of time and began again 08/2013  SpO2 100%   BMI 49.42 kg/m       Objective:   Physical Exam Constitutional:      Appearance: She is well-developed.  Neck:     Thyroid: No thyromegaly.  Cardiovascular:     Rate and Rhythm: Normal rate and regular rhythm.     Heart sounds: Normal heart sounds. No murmur.  Pulmonary:     Effort: Pulmonary effort is normal. No respiratory distress.     Breath sounds: Normal breath sounds. No wheezing.  Musculoskeletal:     Cervical back: Neck supple.  Skin:    General: Skin is warm and dry.  Neurological:     Mental Status: She is alert and oriented to person, place, and time.  Psychiatric:        Mood and Affect: Affect is flat.        Behavior: Behavior normal.        Thought Content: Thought content normal.        Judgment: Judgment normal.           Assessment & Plan:  Depression- remains uncontrolled. Continue current meds.  Patient is encouraged to call to arrange an appointment with psychiatry.  Memory loss- pt states that this is still a concern for her. I gave her the number for Neurology to schedule her neurocognitive evaluation.  b12 deficiency- taking oral supplement. Will obtain follow up level.  GERD- stable on PPI  HTN- bp improved back on medication.  Check follow up BMET.   This visit occurred during the SARS-CoV-2 public health emergency.  Safety protocols were in place, including screening questions prior to the visit, additional usage of staff PPE, and extensive cleaning of exam room while observing appropriate contact time as indicated for disinfecting solutions.

## 2019-07-15 NOTE — Patient Instructions (Addendum)
Please complete lab work prior to leaving. Please call [facility] to schedule an appointment for neurocognitive testing  [phone] Please call psychiatry to establish with a psychiatrist for your depression.    Psychiatric Services:  Dr. Modena Morrow - [phone] [facility]) - [facility] [place]) - [phone] Dr. Chucky May [facility]) [phone] [facility]) [phone] [place] ([place]) - [phone] [facility]) - [place], [address] [place], [place]

## 2019-07-18 ENCOUNTER — Encounter: Payer: Self-pay | Admitting: Psychology

## 2019-08-02 ENCOUNTER — Ambulatory Visit (INDEPENDENT_AMBULATORY_CARE_PROVIDER_SITE_OTHER): Payer: Medicare Other | Admitting: Psychology

## 2019-08-02 ENCOUNTER — Encounter: Payer: Self-pay | Admitting: Psychology

## 2019-08-02 ENCOUNTER — Ambulatory Visit: Payer: Medicare Other | Admitting: Psychology

## 2019-08-02 ENCOUNTER — Other Ambulatory Visit: Payer: Self-pay

## 2019-08-02 DIAGNOSIS — R4189 Other symptoms and signs involving cognitive functions and awareness: Secondary | ICD-10-CM

## 2019-08-02 DIAGNOSIS — F411 Generalized anxiety disorder: Secondary | ICD-10-CM | POA: Diagnosis not present

## 2019-08-02 DIAGNOSIS — F329 Major depressive disorder, single episode, unspecified: Secondary | ICD-10-CM | POA: Insufficient documentation

## 2019-08-02 DIAGNOSIS — F331 Major depressive disorder, recurrent, moderate: Secondary | ICD-10-CM

## 2019-08-02 DIAGNOSIS — G4733 Obstructive sleep apnea (adult) (pediatric): Secondary | ICD-10-CM | POA: Diagnosis not present

## 2019-08-02 NOTE — Progress Notes (Signed)
NEUROPSYCHOLOGICAL EVALUATION [place] Department of Neurology  Date of Evaluation: August 02, 2019  Reason for Referral:   Hailey Vance is a 54 y.o. right-handed African-American female referred by Debbrah Alar, NP, to characterize her current cognitive functioning and assist with diagnostic clarity and treatment planning in the context of subjective cognitive decline and psychiatric comorbidities.   Assessment and Plan:   Clinical Impression(s): Hailey Vance's pattern of performance is suggestive of neuropsychological functioning within normal limits, with performance across all assessed cognitive domains (processing speed, attention/concentration, executive functioning, receptive and expressive language, visuospatial abilities, and learning and memory) scoring well within normal limits. More specific to memory, Hailey Vance was able to learn novel verbal and visual information efficiently and retained this knowledge after lengthy delays. Overall, memory performance combined with intact performances across other areas of cognitive functioning is not suggestive of early-onset Alzheimer's disease. Hailey Vance denied difficulties completing instrumental activities of daily living (ADLs) independently.  Across mood-related questionnaires, she reported severe levels of depression and moderate levels of anxiety. She also reported moderate levels of sleep dysfunction and described not using her CPAP machine despite her prior sleep study suggesting severe obstructive sleep apnea. Ongoing mood and sleep dysfunction can certainly affect day-to-day cognitive efficiency, especially across domains of processing speed, attention/concentration, executive functioning, and learning and memory. Overall, this represents the most likely etiology for reported cognitive difficulties.   Recommendations: A combination of medication and psychotherapy has been shown to be most effective at  treating symptoms of anxiety and depression. Hailey Vance reported scheduling an initial appointment with a psychiatrist for later this week. This is strongly encouraged given the level of her reported psychiatric distress.   Likewise, Hailey Vance is encouraged to consider engaging in short-term psychotherapy to address symptoms of psychiatric distress. She would benefit from an active and collaborative therapeutic environment, rather than one purely supportive in nature. Recommended treatment modalities include Cognitive Behavioral Therapy (CBT) or Acceptance and Commitment Therapy (ACT).  Medical records suggest severe obstructive sleep apnea with severe oxygen desaturation. However, Hailey Vance does not currently use her CPAP machine. She is strongly encouraged to use this device to treat this condition. Untreated sleep apnea will increase her risk of stroke, heart attack, and the future development of dementia.   Hailey Vance is encouraged to attend to lifestyle factors for brain health (e.g., regular physical exercise, good nutrition habits, regular participation in cognitively-stimulating activities, and general stress management techniques), which are likely to have benefits for both emotional adjustment and cognition. In fact, in addition to promoting good general health, regular exercise incorporating aerobic activities (e.g., brisk walking, jogging, cycling, water aerobics, etc.) has been demonstrated to be a very effective treatment for depression and stress, with similar efficacy rates to both antidepressant medication and psychotherapy. Optimal control of vascular risk factors (including safe cardiovascular exercise and adherence to dietary recommendations) is encouraged.   If interested, there are some activities which have therapeutic value and can be useful in keeping her cognitively stimulated. For suggestions, Hailey Vance is encouraged to go to the following website:  https://www.[facility].org/get-to-know-barrow/centers-programs/neurorehabilitation-center/neuro-rehab-apps-and-games/ which has options, categorized by level of difficulty. It should be noted that these activities should not be viewed as a substitute for therapy.  To address problems with working memory, she may wish to consider:   -Avoiding external distractions when needing to concentrate   -Limiting exposure to fast paced environments with multiple sensory demands   -Writing down complicated information and using checklists   -  Attempting and completing one task at a time (i.e., no multi-tasking)   -Verbalizing aloud each step of a task to maintain focus   -Reducing the amount of information considered at one time  Review of Records:   Hailey Vance was seen by [facility] Debbrah Alar, NP) on 07/15/2019 for follow-up care. Previous labs were reviewed. TSH/RPR were normal while B12 was low normal; she was started on an oral B12 supplement in response. She reported ongoing memory loss and expressed a desire to complete a comprehensive evaluation. Ongoing depressive symptoms were also reported. Hailey Vance reported discussing a referral to psychiatry, but Hailey Vance expressed some hesitancy surrounding that referral. Ultimately, Hailey Vance was referred for a comprehensive neuropsychological evaluation to characterize her cognitive abilities and to assist with diagnostic clarity and treatment planning.   Head CT on 09/09/2011 revealed questionably slight cerebellar atrophy, but was otherwise negative. No other neuroimaging was available for review.   Past Medical History:  Diagnosis Date  . Bilateral primary osteoarthritis of knee 03/29/2019  . Blood transfusion 1990   In [place], [place] with C/S Surgery - 5 units transfused  . Chest pain   . Chronic back pain   . Fibroid   . Generalized anxiety disorder 09/06/2014  . Headache    otc med prn  . Heart murmur   . HTN (hypertension)  09/06/2014   hypertension   . Low blood potassium   . Major depressive disorder   . OSA (obstructive sleep apnea) 09/06/2014   2/17  IMPRESSIONS - Severe obstructive sleep apnea occurred during the diagnostic portion of the study (AHI = 55.0/hour). An optimal PAP pressure was selected for this patient ( 13 cm of water) - No significant central sleep apnea occurred during the diagnostic portion of the study (CAI = 0.0/hour). - Severe oxygen desaturation was noted during the diagnostic portion of the study (Min O2 = 81.00%)  . Osteoarthritis of both hips 06/15/2019  . Plantar fasciitis of left foot   . Vitamin D deficiency 09/06/2014    Past Surgical History:  Procedure Laterality Date  . BILATERAL OOPHORECTOMY     Complete removal of left ovary and partial removal of right ovary.  . CESAREAN SECTION  1990   x 1  . COLONOSCOPY WITH PROPOFOL N/A 08/30/2015   Procedure: COLONOSCOPY WITH PROPOFOL;  Surgeon: Mauri Pole, MD;  Location: [facility];  Service: Endoscopy;  Laterality: N/A;  . CYSTOSCOPY N/A 05/23/2015   Procedure: CYSTOSCOPY;  Surgeon: Bobbye Charleston, MD;  Location: [facility];  Service: Gynecology;  Laterality: N/A;  . gtastric sleeve     02-15-18 Dr. Excell Seltzer  . HERNIA REPAIR    . HIATAL HERNIA REPAIR N/A 02/15/2018   Procedure: HERNIA REPAIR HIATAL;  Surgeon: Excell Seltzer, MD;  Location: [facility];  Service: General;  Laterality: N/A;  . HYSTEROSCOPY WITH D & C N/A 01/11/2015   Procedure: DILATATION AND CURETTAGE /HYSTEROSCOPY;  Surgeon: Jerelyn Charles, MD;  Location: [facility];  Service: Gynecology;  Laterality: N/A;  . KNEE SURGERY     right knee  . LAPAROSCOPIC GASTRIC SLEEVE RESECTION N/A 02/15/2018   Procedure: LAPAROSCOPIC GASTRIC SLEEVE RESECTION WITH UPPER ENDO AND ERAS PATHWAY;  Surgeon: Excell Seltzer, MD;  Location: [facility];  Service: General;  Laterality: N/A;  . LIPOMA EXCISION Left 12/02/2016   Procedure: EXCISION LIPOMA OF LEFT SHOULDER;  Surgeon: Garald Balding, MD;  Location: [facility];  Service: Orthopedics;  Laterality: Left;  . ROBOTIC ASSISTED TOTAL HYSTERECTOMY WITH  SALPINGECTOMY Bilateral 05/23/2015   Procedure: ROBOTIC ASSISTED TOTAL HYSTERECTOMY WITH SALPINGECTOMYwith right oophorectomy;  Surgeon: Bobbye Charleston, MD;  Location: [facility];  Service: Gynecology;  Laterality: Bilateral;  . TUBAL LIGATION  1990    Current Outpatient Medications:  .  amLODipine (NORVASC) 2.5 MG tablet, Take 3 tablets (7.5 mg total) by mouth daily. Please call patient as soon as rx is ready, Disp: 90 tablet, Rfl: 5 .  buPROPion (WELLBUTRIN XL) 300 MG 24 hr tablet, Take 1 tablet by mouth once daily, Disp: 90 tablet, Rfl: 1 .  Ca Phosphate-Cholecalciferol (CALTRATE GUMMY BITES PO), Take by mouth., Disp: , Rfl:  .  Cholecalciferol (VITAMIN D3) 50 MCG (2000 UT) CHEW, Chew 8,000 Units by mouth daily., Disp: , Rfl:  .  Cyanocobalamin (B-12) 500 MCG TABS, Take 1 tablet by mouth daily., Disp: 30 tablet, Rfl:  .  cyclobenzaprine (FLEXERIL) 5 MG tablet, Take 1 tablet (5 mg total) by mouth at bedtime as needed for muscle spasms., Disp: 15 tablet, Rfl: 0 .  lisinopril-hydrochlorothiazide (ZESTORETIC) 20-25 MG tablet, Take 1 tablet by mouth once daily, Disp: 90 tablet, Rfl: 1 .  meloxicam (MOBIC) 7.5 MG tablet, TAKE 1 TABLET BY MOUTH ONCE DAILY AS NEEDED FOR PAIN, Disp: 90 tablet, Rfl: 1 .  Menthol, Topical Analgesic, (BIOFREEZE) 4 % GEL, Apply 1 application topically daily., Disp: , Rfl:  .  multivitamin-iron-minerals-folic acid (CENTRUM) chewable tablet, Chew 1 tablet by mouth daily., Disp: , Rfl:  .  omeprazole (PRILOSEC) 40 MG capsule, Take 1 capsule (40 mg total) by mouth daily., Disp: 30 capsule, Rfl: 0 .  venlafaxine XR (EFFEXOR-XR) 150 MG 24 hr capsule, TAKE 1 CAPSULE BY MOUTH ONCE DAILY WITH BREAKFAST, Disp: 90 capsule, Rfl: 1  Clinical Interview:   Cognitive Symptoms: Decreased short-term memory: Endorsed. Ms. Everette reported occasional instances of forgetting where  she is going while driving. She also acknowledged trouble remembering the details of previous conversations and losing her train of thought while conversing with others. She noted that her daughter has stated that she will often repeat herself or ask repetitive questions. Deficits were said to have been present for the past year, but were said to have worsened over the past six months.  Decreased long-term memory: Denied. Decreased attention/concentration: Endorsed. She described herself as being "all over the place," noting that she gets distracted easily and often starts projects but is unable to finish them.  Reduced processing speed: Endorsed. Difficulties with executive functions: Endorsed. She reported trouble with organization. She also described a potential increase in impulsive online shopping.  Difficulties with emotion regulation: Endorsed. Regarding personality changes, she acknowledged being more easily angered/irritated. She also reported feeling as though she is "always yelling" at her grandchildren. Difficulties with receptive language: Denied. Difficulties with word finding: Endorsed. Words were said to generally come to her with time.  Decreased visuoperceptual ability: Denied.  Difficulties completing ADLs: Largely denied. She reported occasional trouble remembering to take her medications or remembering if she has already taken them. She reported having a pillbox but does not use it regularly.   Additional Medical History: History of traumatic brain injury/concussion: Denied. History of stroke: Denied. History of seizure activity: Denied. History of known exposure to toxins: Denied. Symptoms of chronic pain: Endorsed. She reported diffuse chronic pain, especially localized to her lower back, hip, knees, and ankle. She reported needing knee replacement surgery but has been unable to schedule due to weight-related concerns.  Experience of frequent headaches/migraines: Endorsed.  She reported daily headaches, usually  alleviated with Excedrin. However, there are instances where headache symptoms will lessen but persist throughout the day.  Frequent instances of dizziness/vertigo: Denied.  Sensory changes: She wears glasses with positive effect. Other sensory changes/difficulties (e.g., hearing, taste, or smell) were denied.  Balance/coordination difficulties: Denied. Other motor difficulties: Endorsed. She reported a slight tremor in her right hand while performing fine motor tasks (e.g., holding her phone).   Sleep History: Estimated hours obtained each night: Less than 5 hours per night on average.  Difficulties falling asleep: Endorsed. Difficulties staying asleep: Endorsed. Feels rested and refreshed upon awakening: Denied.  History of snoring: Endorsed. History of waking up gasping for air: Endorsed. Witnessed breath cessation while asleep: Endorsed. Medical records suggest severe obstructive sleep apnea with severe oxygen desaturation. CPAP use was recommended; however, she denied currently using her CPAP machine due to her feeling as though the mask "smothers" her and is uncomfortable.   History of vivid dreaming: Denied. Excessive movement while asleep: Denied. Instances of acting out her dreams: Denied outside of occasionally talking or laughing in her sleep.    Psychiatric/Behavioral Health History: Depression: Endorsed. She described her current mood as "blah" and acknowledged a history of depression. She reported that, after a long history of hesitancy, she has scheduled an initial appointment with a psychiatrist early next week to address these concerns. Current or remote suicidal ideation, intent, or plan was denied.  Anxiety: Endorsed. She specifically reported the presence of physical symptoms of anxiety (e.g., heart racing).  Mania: Denied. Trauma History: Denied. Visual/auditory hallucinations: Denied. Delusional thoughts: Denied.  Tobacco:  Denied. Alcohol: She reported occasional alcohol consumption and denied a history of problematic alcohol abuse or dependence.  Recreational drugs: Denied. Caffeine: Denied.   Family History: Problem Relation Age of Onset  . Lung cancer Mother 29       dec  . Cancer Mother   . Hypertension Mother   . Arthritis Mother   . Cancer Father 89       prostate  . Hypertension Father   . Leukemia Sister   . Hypertension Sister   . Arthritis Sister   . Hyperlipidemia Sister   . Hypertension Brother   . Arthritis Brother   . Multiple sclerosis Sister   . Hypertension Sister   . Hypertension Sister   . Hypertension Brother   . Hypertension Brother   . Breast cancer Paternal Aunt   . Alzheimer's disease Paternal Aunt   . Diabetes Maternal Grandmother   . Stroke Maternal Grandmother   . Heart attack Paternal Grandmother   . Asthma Other    This information was confirmed by Ms. Darrick Grinder.  Academic/Vocational History: Highest level of educational attainment: 12 years. She graduated from high school. She did attend college, but ultimately did not complete enough classes to be credited with a single year. 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 Mathematics, especially algebra, was noted as a relative weakness.  History of developmental delay: Denied. History of grade repetition: Denied. Enrollment in special education courses: Denied. History of LD/ADHD: Denied.  Employment: She currently receives disability benefits. 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   Evaluation Results:   Behavioral Observations: Ms. Goodell was unaccompanied, arrived to her appointment on time, and was appropriately dressed and groomed. She appeared alert and oriented. She ambulated slowly, but observed gait and station were within normal limits. Gross motor functioning appeared intact upon informal observation and no abnormal movements (e.g., tremors) were noted. Her affect  was generally relaxed and positive, but did range appropriately given the subject being discussed during the clinical interview or the task at hand during testing procedures. Spontaneous speech was fluent. Mild word finding difficulties were observed during the clinical interview. Thought processes were coherent, organized, and normal in content. Regarding insight into her cognitive difficulties, she appeared to misattribute cognitive difficulties to neurological causes rather than psychiatric difficulties and ongoing sleep concerns. During testing, sustained attention was appropriate. Task engagement was adequate and she persisted when challenged. Overall, Ms. Tokarz was cooperative with the clinical interview and subsequent testing procedures.   Adequacy of Effort: The validity of neuropsychological testing is limited by the extent to which the individual being tested may be assumed to have exerted adequate effort during testing. Ms. Ambrosino expressed her intention to perform to the best of her abilities and exhibited adequate task engagement and persistence. Scores across stand-alone and embedded performance validity measures were within expectation. As such, the results of the current evaluation are believed to be a valid representation of Ms. Delmont's current cognitive functioning.  Test Results: Ms. Alejandrez was fully oriented at the time of the current evaluation.  Intellectual abilities based upon educational and vocational attainment were estimated to be in the average range. Premorbid abilities were estimated to be within the average range based upon a single-word reading test.   Processing speed was average. Basic attention was average. More complex attention (e.g., working memory) was also average. Executive functioning was variable but overall appropriate, ranging from the below average to well above average normative ranges.  Assessed receptive language abilities were above average. Likewise, Ms.  Kiester did not exhibit any difficulties comprehending task instructions and answered all questions asked of her appropriately. Assessed expressive language (e.g., verbal fluency and confrontation naming) was average to above average.     Assessed visuospatial/visuoconstructional abilities were average.    Learning (i.e., encoding) of novel verbal and visual information was average to above average. Spontaneous delayed recall (i.e., retrieval) of previously learned information was average to well above average. Retention rates were 100% across a story learning task, 100% across a list learning task, and 100% across a shape learning task. Performance across recognition tasks was appropriate, suggesting evidence for information consolidation.   Results of emotional screening instruments suggested that recent symptoms of generalized anxiety were in the moderate range, while symptoms of depression were within the severe range. A screening instrument assessing recent sleep quality suggested the presence of moderate sleep dysfunction.  Tables of Scores:   Note: This summary of test scores accompanies the interpretive report and should not be considered in isolation without reference to the appropriate sections in the text. Descriptors are based on appropriate normative data and may be adjusted based on clinical judgment. The terms "impaired" and "within normal limits (WNL)" are used when a more specific level of functioning cannot be determined.       Effort Testing:   DESCRIPTOR       ACS Word Choice: --- --- Within Expectation  HVLT-R Recognition Discrimination Index: --- --- Within Expectation  BVMT-R Retention Percentage: --- --- Within Expectation  D-KEFS Color Word Effort Index: --- --- Within Expectation       Orientation:      Raw Score Percentile   NAB Orientation, Form 1 29/29 --- ---       Intellectual Functioning:           Standard Score Percentile   Test of Premorbid Functioning: 99  47 Average  Memory:          Wechsler Memory Scale (WMS-IV):                       Raw Score (Scaled Score) Percentile     Logical Memory I 34/50 (13) 84 Above Average    Logical Memory II 34/50 (15) 95 Well Above Average    Logical Memory Recognition 28/30 >75 Above Average       Hopkins Verbal Learning Test (HVLT-R), Form 1: Raw Score (T Score) Percentile     Total Trials 1-3 28/36 (51) 54 Average    Delayed Recall 11/12 (55) 69 Average    Recognition Discrimination Index 11 (51) 54 Average      True Positives 12 --- ---      False Positives 1 --- ---        Brief Visuospatial Memory Test (BVMT-R), Form 1: Raw Score (T Score) Percentile     Total Trials 1-3 24/36 (51) 54 Average    Delayed Recall 11/12 (61) 86 Above Average    Recognition Discrimination Index 6 >16 Within Normal Limits      Recognition Hits 6/6 >16 Within Normal Limits      False Positive Errors 0 >16 Within Normal Limits        Attention/Executive Function:          Trail Making Test (TMT): Raw Score (T Score) Percentile     Part A 22 secs.,  0 errors (63) 91 Well Above Average    Part B 63 secs.,  0 errors (64) 92 Well Above Average         Scaled Score Percentile   WAIS-IV Coding: 8 25 Average       NAB Attention Module, Form 1: T Score Percentile     Digits Forward 44 27 Average    Digits Backwards 50 50 Average       D-KEFS Color-Word Interference Test: Raw Score (Scaled Score) Percentile     Color Naming 31 secs. (10) 50 Average    Word Reading 21 secs. (11) 63 Average    Inhibition 73 secs. (8) 25 Average      Total Errors 0 errors (12) 75 Above Average    Inhibition/Switching 65 secs. (11) 63 Average      Total Errors 1 error (11) 63 Average       D-KEFS Verbal Fluency Test: Raw Score (Scaled Score) Percentile     Letter Total Correct 46 (13) 84 Above Average    Category Total Correct 41 (11) 63 Average    Category Switching Total Correct 14 (11) 63 Average    Category Switching  Accuracy 13 (11) 63 Average      Total Set Loss Errors 2 (10) 50 Average      Total Repetition Errors 1 (12) 75 Above Average       D-KEFS 20 Questions Test: Scaled Score Percentile     Total Weighted Achievement Score 14 91 Above Average    Initial Abstraction Score 7 16 Below Average       [employer] Test [facility]): Raw Score Percentile     Categories (trials) 2 (64) 11-16 Below Average    Total Errors 14 54 Average    Perseverative Errors 8 46 Average    Non-Perseverative Errors 6 34 Average    Failure to Maintain Set 3 --- ---       Language:  NAB Language Module, Form 1: T Score Percentile     Auditory Comprehension 58 79 Above Average    Naming 29/31 (43) 25 Average       Visuospatial/Visuoconstruction:      Raw Score Percentile   Clock Drawing: 9/10 --- Within Normal Limits       NAB Spatial Module, Form 1: T Score Percentile     Visual Discrimination 53 62 Average    Figure Drawing Copy 55 69 Average    Figure Drawing Immediate Recall 50 50 Average        Scaled Score Percentile   WAIS-IV Block Design: 9 37 Average  WAIS-IV Matrix Reasoning: 7 16 Below Average       Mood and Personality:      Raw Score Percentile   Beck Depression Inventory - II: 31 --- Severe  PROMIS Anxiety Questionnaire: 27 --- Moderate       Additional Questionnaires:      Raw Score Percentile   PROMIS Sleep Disturbance Questionnaire: 37 --- Moderate   Informed Consent and Coding/Compliance:   Ms. Crush was provided with a verbal description of the nature and purpose of the present neuropsychological evaluation. Also reviewed were the foreseeable risks and/or discomforts and benefits of the procedure, limits of confidentiality, and mandatory reporting requirements of this provider. The patient was given the opportunity to ask questions and receive answers about the evaluation. Oral consent to participate was provided by the patient.   This evaluation was conducted by Christia Reading, Ph.D., licensed clinical neuropsychologist. Ms. Pullis completed a comprehensive clinical interview with Dr. Melvyn Novas, billed as one unit [phone], and 140 minutes of cognitive testing and scoring, billed as one unit [phone] and four additional units [id number]. Psychometrist Milana Kidney, B.S., assisted Dr. Melvyn Novas with test administration and scoring procedures. As a separate and discrete service, Dr. Melvyn Novas spent a total of 180 minutes in interpretation and report writing billed as one unit [phone] and two units [id number].

## 2019-08-02 NOTE — Progress Notes (Signed)
   Psychometrician Note   Cognitive testing was administered to Jenelle Mages by Milana Kidney, B.S. (psychometrist) under the supervision of Dr. Christia Reading, Ph.D., licensed psychologist on 08/02/19. Ms. Scotland did not appear overtly distressed by the testing session per behavioral observation or responses across self-report questionnaires. Dr. Christia Reading, Ph.D. checked in with Ms. Stengle as needed to manage any distress related to testing procedures (if applicable). Rest breaks were offered.    The battery of tests administered was selected by Dr. Christia Reading, Ph.D. with consideration to Ms. Cerreta's current level of functioning, the nature of her symptoms, emotional and behavioral responses during interview, level of literacy, observed level of motivation/effort, and the nature of the referral question. This battery was communicated to the psychometrist. Communication between Dr. Christia Reading, Ph.D. and the psychometrist was ongoing throughout the evaluation and Dr. Christia Reading, Ph.D. was immediately accessible at all times. Dr. Christia Reading, Ph.D. provided supervision to the psychometrist on the date of this service to the extent necessary to assure the quality of all services provided.    Saoirse Melley will return within approximately 1-2 weeks for an interactive feedback session with Dr. Melvyn Novas at which time her test performances, clinical impressions, and treatment recommendations will be reviewed in detail. Ms. Funches understands she can contact our office should she require our assistance before this time.  A total of 140 minutes of billable time were spent face-to-face with Ms. Kliethermes by the psychometrist. This includes both test administration and scoring time. Billing for these services is reflected in the clinical report generated by Dr. Christia Reading, Ph.D..  This note reflects time spent with the psychometrician and does not include test scores or any clinical  interpretations made by Dr. Melvyn Novas. The full report will follow in a separate note.

## 2019-08-08 ENCOUNTER — Ambulatory Visit (INDEPENDENT_AMBULATORY_CARE_PROVIDER_SITE_OTHER): Payer: Medicare Other | Admitting: Psychologist

## 2019-08-08 DIAGNOSIS — F321 Major depressive disorder, single episode, moderate: Secondary | ICD-10-CM

## 2019-08-09 ENCOUNTER — Ambulatory Visit (INDEPENDENT_AMBULATORY_CARE_PROVIDER_SITE_OTHER): Payer: Medicare Other | Admitting: Psychology

## 2019-08-09 ENCOUNTER — Other Ambulatory Visit: Payer: Self-pay

## 2019-08-09 ENCOUNTER — Ambulatory Visit: Payer: Medicare Other | Admitting: Psychologist

## 2019-08-09 DIAGNOSIS — F411 Generalized anxiety disorder: Secondary | ICD-10-CM

## 2019-08-09 DIAGNOSIS — G4733 Obstructive sleep apnea (adult) (pediatric): Secondary | ICD-10-CM

## 2019-08-09 DIAGNOSIS — F331 Major depressive disorder, recurrent, moderate: Secondary | ICD-10-CM

## 2019-08-09 NOTE — Progress Notes (Addendum)
   Neuropsychology Feedback Session Hailey Vance Department of Neurology  Reason for Referral:   Hailey Vance a 54 y.o. right-handed African-American female referred by Debbrah Alar, NP,to characterize hercurrent cognitive functioning and assist with diagnostic clarity and treatment planning in the context of subjective cognitive decline and psychiatric comorbidities.  Feedback:   Hailey Vance completed a comprehensive neuropsychological evaluation on 08/02/2019. Please refer to that encounter for the full report and recommendations. Briefly, results suggested neuropsychological functioning within normal limits, with performance across all assessed cognitive domains (processing speed, attention/concentration, executive functioning, receptive and expressive language, visuospatial abilities, and learning and memory) scoring well within normal limits. Across mood-related questionnaires, she reported severe levels of depression and moderate levels of anxiety. She also reported moderate levels of sleep dysfunction and described not using her CPAP machine despite her prior sleep study suggesting severe obstructive sleep apnea. Ongoing mood and sleep dysfunction can certainly affect day-to-day cognitive efficiency, especially across domains of processing speed, attention/concentration, executive functioning, and learning and memory. Overall, this represents the most likely etiology for reported cognitive difficulties.  Hailey Vance was unaccompanied. She was at her residence while I was within my office. Content of the current session focused on the results of her neuropsychological evaluation. Hailey Vance was given the opportunity to ask questions and her questions were answered. She was encouraged to reach out should additional questions arise. Her report is available to her on MyChart.      Less than 16 minutes were spent conducting the current feedback session with Ms.  [name].

## 2019-08-10 ENCOUNTER — Telehealth: Payer: Self-pay | Admitting: Family

## 2019-08-10 DIAGNOSIS — G4733 Obstructive sleep apnea (adult) (pediatric): Secondary | ICD-10-CM

## 2019-08-10 NOTE — Telephone Encounter (Signed)
Patient advised of provider's comments and recommendations. She reports she has appt wit psychiatry on 6-07 and 6-22 .

## 2019-08-10 NOTE — Telephone Encounter (Signed)
Please advise pt that I reviewed her psychology evaluation. It does not appear that she has any issues with memory.  It does appear that her depression and untreated sleep apnea are contributing to her symptoms.  I would recommend that she schedule an appointment with psychiatry like we discussed at her visit and also use her cpap nightly. I am going to refer her back to the sleep specialist.

## 2019-08-23 ENCOUNTER — Ambulatory Visit (INDEPENDENT_AMBULATORY_CARE_PROVIDER_SITE_OTHER): Payer: Medicare Other | Admitting: Psychologist

## 2019-08-23 DIAGNOSIS — F321 Major depressive disorder, single episode, moderate: Secondary | ICD-10-CM | POA: Diagnosis not present

## 2019-09-08 ENCOUNTER — Ambulatory Visit (INDEPENDENT_AMBULATORY_CARE_PROVIDER_SITE_OTHER): Payer: Medicare Other | Admitting: Psychologist

## 2019-09-08 DIAGNOSIS — F321 Major depressive disorder, single episode, moderate: Secondary | ICD-10-CM

## 2019-09-13 ENCOUNTER — Ambulatory Visit: Payer: Medicare Other | Admitting: Psychologist

## 2019-09-15 ENCOUNTER — Encounter (HOSPITAL_COMMUNITY): Payer: Self-pay

## 2019-09-16 ENCOUNTER — Other Ambulatory Visit: Payer: Self-pay

## 2019-09-16 ENCOUNTER — Telehealth: Payer: Self-pay

## 2019-09-16 ENCOUNTER — Encounter: Payer: Self-pay | Admitting: Orthopaedic Surgery

## 2019-09-16 ENCOUNTER — Ambulatory Visit (INDEPENDENT_AMBULATORY_CARE_PROVIDER_SITE_OTHER): Payer: Medicare Other | Admitting: Orthopaedic Surgery

## 2019-09-16 VITALS — Ht 64.0 in | Wt 287.0 lb

## 2019-09-16 DIAGNOSIS — M170 Bilateral primary osteoarthritis of knee: Secondary | ICD-10-CM

## 2019-09-16 MED ORDER — BUPIVACAINE HCL 0.5 % IJ SOLN
2.0000 mL | INTRAMUSCULAR | Status: AC | PRN
  Administered 2019-09-16: 2 mL via INTRA_ARTICULAR

## 2019-09-16 MED ORDER — LIDOCAINE HCL 1 % IJ SOLN
2.0000 mL | INTRAMUSCULAR | Status: AC | PRN
  Administered 2019-09-16: 2 mL

## 2019-09-16 MED ORDER — METHYLPREDNISOLONE ACETATE 40 MG/ML IJ SUSP
80.0000 mg | INTRAMUSCULAR | Status: AC | PRN
  Administered 2019-09-16: 80 mg via INTRA_ARTICULAR

## 2019-09-16 MED ORDER — BUPIVACAINE HCL 0.5 % IJ SOLN
2.0000 mL | INTRAMUSCULAR | Status: AC | PRN
Start: 2019-09-16 — End: 2019-09-16
  Administered 2019-09-16: 2 mL via INTRA_ARTICULAR

## 2019-09-16 NOTE — Telephone Encounter (Signed)
Please get approval for Bilateral knee Gel injections-Dr. Durward Fortes pt.

## 2019-09-16 NOTE — Progress Notes (Signed)
Office Visit Note   Patient: Hailey Vance           Date of Birth: [birth year]           MRN: [id number] Visit Date: 09/16/2019              Requested by: Hailey Vance [address] [place],  [place] [address] PCP: Hailey Vance   Assessment & Plan: Visit Diagnoses:  1. Bilateral primary osteoarthritis of knee     Plan: Bilateral end-stage osteoarthritis of the knees and morbidly obese patient with a BMI of 49.  Long discussion regarding treatment options reviewing the same from the past.  Will inject knees with cortisone today and pre-CERT Visco supplementation.  Hailey Vance has had a prior gastric sleeve procedure and has lost about 100 pounds but presently weighs 287.  She is aware of her weight and continues to "work on"  Follow-Up Instructions: Return Pre-CERT Visco supplementation.   Orders:  No orders of the defined types were placed in this encounter.  No orders of the defined types were placed in this encounter.     Procedures: Large Joint Inj: bilateral knee on 09/16/2019 10:22 AM Indications: diagnostic evaluation Details: 25 G 1.5 in needle, anteromedial approach  Arthrogram: No  Medications (Right): 2 mL lidocaine 1 %; 2 mL bupivacaine 0.5 %; 80 mg methylPREDNISolone acetate 40 MG/ML Medications (Left): 2 mL lidocaine 1 %; 2 mL bupivacaine 0.5 %; 80 mg methylPREDNISolone acetate 40 MG/ML Consent was given by the patient. Immediately prior to procedure a time out was called to verify the correct patient, procedure, equipment, support staff and site/side marked as required. Patient was prepped and draped in the usual sterile fashion.       Clinical Data: No additional findings.   Subjective: Chief Complaint  Patient presents with  . Left Knee - Pain  . Right Knee - Pain  Hailey Vance is having recurrent symptoms of osteoarthritis of both knees.  Prior x-rays reveal significant arthritis.  She has responded well to cortisone  in the past.  Has a long history of being morbidly obese.  Present BMI is 49.  She has had a prior gastric sleeve procedure and lost nearly 100 pounds but presently is stable.  She is not diabetic  HPI  Review of Systems   Objective: Vital Signs: Ht 5\' 4"  (1.626 m)   Wt 287 lb (130.2 kg)   LMP 10/02/2007 Comment: but had bleeding 18 months ago for short period of time and began again 08/2013  BMI 49.26 kg/m   Physical Exam Constitutional:      Appearance: She is well-developed.  Eyes:     Pupils: Pupils are equal, round, and reactive to light.  Pulmonary:     Effort: Pulmonary effort is normal.  Skin:    General: Skin is warm and dry.  Neurological:     Mental Status: She is alert and oriented to person, place, and time.  Psychiatric:        Behavior: Behavior normal.     Ortho Exam very large knees.  Difficult to tell whether there is an effusion.  Predominately medial joint pain bilaterally.  No effusion.  Does walk with an antalgic gait related to her knees.  Specialty Comments:  No specialty comments available.  Imaging: No results found.   PMFS History: Patient Active Problem List   Diagnosis Date Noted  . Major depressive disorder   . Low back pain 06/15/2019  .  Osteoarthritis of both hips 06/15/2019  . Bilateral primary osteoarthritis of knee 03/29/2019  . Morbid obesity ([place]) 02/15/2018  . Lipoma of left shoulder 12/02/2016  . BMI 50.0-59.9, adult ([facility])   . HTN (hypertension) 09/06/2014  . Osteoarthritis 09/06/2014  . Post-menopausal bleeding 09/06/2014  . OSA (obstructive sleep apnea) 09/06/2014  . Generalized anxiety disorder 09/06/2014  . Vitamin D deficiency 09/06/2014  . Chronic back pain    Past Medical History:  Diagnosis Date  . Bilateral primary osteoarthritis of knee 03/29/2019  . Blood transfusion 1990   In [place], [place] with C/S Surgery - 5 units transfused  . Chest pain   . Chronic back pain   . Fibroid   . Generalized anxiety  disorder 09/06/2014  . Headache    otc med prn  . Heart murmur   . HTN (hypertension) 09/06/2014   hypertension   . Low blood potassium   . Major depressive disorder   . OSA (obstructive sleep apnea) 09/06/2014   2/17  IMPRESSIONS - Severe obstructive sleep apnea occurred during the diagnostic portion of the study (AHI = 55.0/hour). An optimal PAP pressure was selected for this patient ( 13 cm of water) - No significant central sleep apnea occurred during the diagnostic portion of the study (CAI = 0.0/hour). - Severe oxygen desaturation was noted during the diagnostic portion of the study (Min O2 = 81.00%)  . Osteoarthritis of both hips 06/15/2019  . Plantar fasciitis of left foot   . Vitamin D deficiency 09/06/2014    Family History  Problem Relation Age of Onset  . Lung cancer Mother 6       dec  . Cancer Mother   . Hypertension Mother   . Arthritis Mother   . Cancer Father 21       prostate  . Hypertension Father   . Leukemia Sister   . Hypertension Sister   . Arthritis Sister   . Hyperlipidemia Sister   . Hypertension Brother   . Arthritis Brother   . Multiple sclerosis Sister   . Hypertension Sister   . Hypertension Sister   . Hypertension Brother   . Hypertension Brother   . Breast cancer Paternal Aunt   . Alzheimer's disease Paternal Aunt   . Diabetes Maternal Grandmother   . Stroke Maternal Grandmother   . Heart attack Paternal Grandmother   . Asthma Other     Past Surgical History:  Procedure Laterality Date  . BILATERAL OOPHORECTOMY     Complete removal of left ovary and partial removal of right ovary.  . CESAREAN SECTION  1990   x 1  . COLONOSCOPY WITH PROPOFOL N/A 08/30/2015   Procedure: COLONOSCOPY WITH PROPOFOL;  Surgeon: Mauri Pole, MD;  Location: [facility];  Service: Endoscopy;  Laterality: N/A;  . CYSTOSCOPY N/A 05/23/2015   Procedure: CYSTOSCOPY;  Surgeon: Bobbye Charleston, MD;  Location: [facility];  Service: Gynecology;  Laterality: N/A;  .  gtastric sleeve     02-15-18 Dr. Excell Vance  . HERNIA REPAIR    . HIATAL HERNIA REPAIR N/A 02/15/2018   Procedure: HERNIA REPAIR HIATAL;  Surgeon: Hailey Seltzer, MD;  Location: [facility];  Service: General;  Laterality: N/A;  . HYSTEROSCOPY WITH D & C N/A 01/11/2015   Procedure: DILATATION AND CURETTAGE /HYSTEROSCOPY;  Surgeon: Jerelyn Charles, MD;  Location: [facility];  Service: Gynecology;  Laterality: N/A;  . KNEE SURGERY     right knee  . LAPAROSCOPIC GASTRIC SLEEVE RESECTION N/A 02/15/2018   Procedure: LAPAROSCOPIC  GASTRIC SLEEVE RESECTION WITH UPPER ENDO AND ERAS PATHWAY;  Surgeon: Hailey Seltzer, MD;  Location: [facility];  Service: General;  Laterality: N/A;  . LIPOMA EXCISION Left 12/02/2016   Procedure: EXCISION LIPOMA OF LEFT SHOULDER;  Surgeon: Garald Balding, MD;  Location: [facility];  Service: Orthopedics;  Laterality: Left;  . ROBOTIC ASSISTED TOTAL HYSTERECTOMY WITH SALPINGECTOMY Bilateral 05/23/2015   Procedure: ROBOTIC ASSISTED TOTAL HYSTERECTOMY WITH SALPINGECTOMYwith right oophorectomy;  Surgeon: Bobbye Charleston, MD;  Location: [facility];  Service: Gynecology;  Laterality: Bilateral;  . TUBAL LIGATION  1990   Social History   Occupational History  . Occupation: Disability  Tobacco Use  . Smoking status: Never Smoker  . Smokeless tobacco: Never Used  Vaping Use  . Vaping Use: Never used  Substance and Sexual Activity  . Alcohol use: No    Alcohol/week: 0.0 standard drinks  . Drug use: No  . Sexual activity: Not Currently    Partners: Male    Birth control/protection: Surgical    Comment: Tubal     Garald Balding, MD   Note - This record has been created using [employer].  Chart creation errors have been sought, but may not always  have been located. Such creation errors do not reflect on  the standard of medical care.

## 2019-09-19 NOTE — Telephone Encounter (Signed)
Noted  

## 2019-09-21 ENCOUNTER — Telehealth: Payer: Self-pay

## 2019-09-21 NOTE — Telephone Encounter (Signed)
Submitted VOB, Gelsyn-3 bilateral knee.

## 2019-09-26 ENCOUNTER — Encounter: Payer: Self-pay | Admitting: Family

## 2019-09-29 ENCOUNTER — Institutional Professional Consult (permissible substitution): Payer: Medicare Other | Admitting: Internal Medicine

## 2019-10-03 ENCOUNTER — Encounter (HOSPITAL_BASED_OUTPATIENT_CLINIC_OR_DEPARTMENT_OTHER): Payer: Self-pay | Admitting: Obstetrics and Gynecology

## 2019-10-06 ENCOUNTER — Encounter (HOSPITAL_BASED_OUTPATIENT_CLINIC_OR_DEPARTMENT_OTHER): Payer: Self-pay | Admitting: Obstetrics and Gynecology

## 2019-10-06 ENCOUNTER — Other Ambulatory Visit: Payer: Self-pay

## 2019-10-06 NOTE — Progress Notes (Addendum)
ADDENDUM:  Pt came for her pre-op lab/ anesthesia airway evaluation today.  Pt evaluated by Dr Ennis Forts, stated ok to proceed.   Spoke w/ via phone for pre-op interview--- PT Lab needs dos----  no             Lab results------ getting CBC, BMP, EKG done 10-07-2019 @ 1530 COVID test ----- 10-10-2019 @ 1440 Arrive at ------- 0815 NPO after MN NO Solid Food.  Clear liquids from MN until--- 0715 Medications to take morning of surgery ----- Wellbutrin, Effexor, Norvasc, Prilosec w/ sips of water Diabetic medication ----- n/a Patient Special Instructions ----- reviewed RCC and visitor guidelines  Pre-Op special Istructions -----  Pt stated she was told she was not staying the night.  Patient verbalized understanding of instructions that were given at this phone interview. Patient denies shortness of breath, chest pain, fever, cough at this phone interview.   Anesthesia Review:  Hx HTN;  S/p Gastric sleeve 2019;  OSA no cpap (pt stated has not used cpap since 2019).  Pt to have weight check and anesthesia airway evaluation 10-07-2019 for BMI 48.55.  Chart to be reviewed by Konrad Felix PA.  PCP:  [name] Cardiologist :  no Chest x-ray :  no EKG :  01-12-2018 epic Echo :  11-26-2017 epic Stress test:  09-19-2014 epic Cardiac Cath :  no Activity level:  No issues with normal activities but does get sob flight stairs Sleep Study/ CPAP :  YES / NO Fasting Blood Sugar :      / Checks Blood Sugar -- times a day:   N/A Blood Thinner/ Instructions /Last Dose: NO ASA / Instructions/ Last Dose :  NO

## 2019-10-07 ENCOUNTER — Other Ambulatory Visit: Payer: Self-pay

## 2019-10-07 ENCOUNTER — Encounter (HOSPITAL_COMMUNITY)
Admission: RE | Admit: 2019-10-07 | Discharge: 2019-10-07 | Disposition: A | Discharge status: Home / Self Care | Payer: Medicare Other | Source: Ambulatory Visit | Attending: Obstetrics and Gynecology | Admitting: Obstetrics and Gynecology

## 2019-10-07 DIAGNOSIS — Z9884 Bariatric surgery status: Secondary | ICD-10-CM | POA: Diagnosis not present

## 2019-10-07 DIAGNOSIS — Z01818 Encounter for other preprocedural examination: Secondary | ICD-10-CM | POA: Diagnosis not present

## 2019-10-07 LAB — CBC
HCT: 43.7 % (ref 36.0–46.0)
Hemoglobin: 14.5 g/dL (ref 12.0–15.0)
MCH: 34.3 pg — ABNORMAL HIGH (ref 26.0–34.0)
MCHC: 33.2 g/dL (ref 30.0–36.0)
MCV: 103.3 fL — ABNORMAL HIGH (ref 80.0–100.0)
Platelets: 242 10*3/uL (ref 150–400)
RBC: 4.23 MIL/uL (ref 3.87–5.11)
RDW: 13.2 % (ref 11.5–15.5)
WBC: 8.8 10*3/uL (ref 4.0–10.5)
nRBC: 0 % (ref 0.0–0.2)

## 2019-10-07 LAB — BASIC METABOLIC PANEL
Anion gap: 10 (ref 5–15)
BUN: 24 mg/dL — ABNORMAL HIGH (ref 6–20)
CO2: 28 mmol/L (ref 22–32)
Calcium: 9.3 mg/dL (ref 8.9–10.3)
Chloride: 102 mmol/L (ref 98–111)
Creatinine, Ser: 1.08 mg/dL — ABNORMAL HIGH (ref 0.44–1.00)
GFR calc Af Amer: >60 mL/min (ref >60)
GFR calc non Af Amer: 58 mL/min — ABNORMAL LOW (ref >60)
Glucose, Bld: 90 mg/dL (ref 70–99)
Potassium: 3.8 mmol/L (ref 3.5–5.1)
Sodium: 140 mmol/L (ref 135–145)

## 2019-10-07 NOTE — Anesthesia Preprocedure Evaluation (Addendum)
Anesthesia Evaluation  Patient identified by MRN, date of birth, ID band Patient awake    Reviewed: Allergy & Precautions, H&P , NPO status , Patient's Chart, lab work & pertinent test results  History of Anesthesia Complications Negative for: history of anesthetic complications  Airway Mallampati: II  TM Distance: >3 FB Neck ROM: Full    Dental no notable dental hx. (+) Missing, Dental Advisory Given,    Pulmonary sleep apnea (no CPAP since gastric bypass) ,    Pulmonary exam normal breath sounds clear to auscultation       Cardiovascular hypertension, Pt. on medications Normal cardiovascular exam Rhythm:Regular Rate:Normal  TTE 2019: EF 55%-60%, normal valves   Neuro/Psych  Headaches, Anxiety Depression    GI/Hepatic Neg liver ROS, GERD  Medicated,  Endo/Other  Morbid obesity (BMI 48)S/P gastric bypass 2019  Renal/GU negative Renal ROS  negative genitourinary   Musculoskeletal  (+) Arthritis ,   Abdominal   Peds  Hematology negative hematology ROS (+)   Anesthesia Other Findings   Reproductive/Obstetrics negative OB ROS                           Anesthesia Physical Anesthesia Plan  ASA: III  Anesthesia Plan: Spinal   Post-op Pain Management:    Induction: Intravenous  PONV Risk Score and Plan: 3 and Ondansetron, Treatment may vary due to age or medical condition, Propofol infusion and Dexamethasone  Airway Management Planned: Simple Face Mask  Additional Equipment:   Intra-op Plan:   Post-operative Plan:   Informed Consent: I have reviewed the patients History and Physical, chart, labs and discussed the procedure including the risks, benefits and alternatives for the proposed anesthesia with the patient or authorized representative who has indicated his/her understanding and acceptance.     Dental advisory given  Plan Discussed with: CRNA  Anesthesia Plan Comments:  (Patient seen in PAT due to BMI >45. Reassuring airway exam as noted. General questions regarding anesthesia answered. Patient informed that anesthetic plan will be determined by her anesthesiologist on day of procedure. Daiva Huge, MD)      Anesthesia Quick Evaluation

## 2019-10-10 ENCOUNTER — Other Ambulatory Visit (HOSPITAL_COMMUNITY)
Admission: RE | Admit: 2019-10-10 | Discharge: 2019-10-10 | Disposition: A | Discharge status: Home / Self Care | Payer: Medicare Other | Source: Ambulatory Visit | Attending: Obstetrics and Gynecology | Admitting: Obstetrics and Gynecology

## 2019-10-10 DIAGNOSIS — Z20822 Contact with and (suspected) exposure to covid-19: Secondary | ICD-10-CM | POA: Insufficient documentation

## 2019-10-10 DIAGNOSIS — Z01812 Encounter for preprocedural laboratory examination: Secondary | ICD-10-CM | POA: Diagnosis not present

## 2019-10-10 LAB — SARS CORONAVIRUS 2 (TAT 6-24 HRS): SARS Coronavirus 2: NEGATIVE

## 2019-10-10 NOTE — Progress Notes (Signed)
Routed abnormal bmp result done 10-07-2019 to Dr Philis Pique in epic.

## 2019-10-11 ENCOUNTER — Other Ambulatory Visit: Payer: Self-pay | Admitting: Obstetrics and Gynecology

## 2019-10-11 MED ORDER — CELECOXIB 100 MG PO CAPS: 100.0000 mg | ORAL_CAPSULE | Freq: Once | ORAL | Status: DC

## 2019-10-11 NOTE — H&P (Signed)
54 y.o. G3P3003 complains of SUI and urethral hypermobility.  She desires definitive.   Past Medical History:  Diagnosis Date  . Bilateral primary osteoarthritis of knee 03/29/2019  . Chronic back pain   . Cystocele with rectocele   . Generalized anxiety disorder   . Headache    otc med prn  . Heart murmur    echo in epic 11-26-2017  normal w/ ef 55-60%  . History of chest pain    pt referred for atyical chest pain , cardiology evaluation w/ dr berry note in epic 10-25-2014, stated with low risk w/ no ischemia , nuclear ef 70% , done 09-19-2014 in epic,  non-cardiac chest pain  . HTN (hypertension)    followed by pcp    (normal nuclear stress test 09-19-2014 in epic)  . Low blood potassium   . Major depressive disorder   . OSA (obstructive sleep apnea)    04/2015 study in e- Severe OSA (AHI = 55.0/hour);   (10-06-2019  per pt has not used cpap since 2019 after gastric bypass)  . Osteoarthritis of both hips 06/15/2019  . Plantar fasciitis of left foot   . S/P gastric bypass 02/15/2018   sleeve  . SUI (stress urinary incontinence, female)   . Vitamin D deficiency 09/06/2014  . Wears glasses    Past Surgical History:  Procedure Laterality Date  . CESAREAN SECTION W/BTL  1990  . COLONOSCOPY WITH PROPOFOL N/A 08/30/2015   Procedure: COLONOSCOPY WITH PROPOFOL;  Surgeon: Mauri Pole, MD;  Location: [facility];  Service: Endoscopy;  Laterality: N/A;  . CYSTOSCOPY N/A 05/23/2015   Procedure: CYSTOSCOPY;  Surgeon: Bobbye Charleston, MD;  Location: [facility];  Service: Gynecology;  Laterality: N/A;  . HIATAL HERNIA REPAIR N/A 02/15/2018   Procedure: HERNIA REPAIR HIATAL;  Surgeon: Excell Seltzer, MD;  Location: [facility];  Service: General;  Laterality: N/A;  . HYSTEROSCOPY WITH D & C N/A 01/11/2015   Procedure: DILATATION AND CURETTAGE /HYSTEROSCOPY;  Surgeon: Jerelyn Charles, MD;  Location: [facility];  Service: Gynecology;  Laterality: N/A;  . KNEE ARTHROSCOPY Right 2010  . LAPAROSCOPIC  GASTRIC SLEEVE RESECTION N/A 02/15/2018   Procedure: LAPAROSCOPIC GASTRIC SLEEVE RESECTION WITH UPPER ENDO AND ERAS PATHWAY;  Surgeon: Excell Seltzer, MD;  Location: [facility];  Service: General;  Laterality: N/A;  . LAPAROSCOPIC OOPHORECTOMY Bilateral 2004   LEFT OOPHORECTOMY AND PARTIAL RIGHT OOPHORECTOMY  . LIPOMA EXCISION Left 12/02/2016   Procedure: EXCISION LIPOMA OF LEFT SHOULDER;  Surgeon: Garald Balding, MD;  Location: [facility];  Service: Orthopedics;  Laterality: Left;  . ROBOTIC ASSISTED TOTAL HYSTERECTOMY WITH SALPINGECTOMY Bilateral 05/23/2015   Procedure: ROBOTIC ASSISTED TOTAL HYSTERECTOMY WITH SALPINGECTOMYwith right oophorectomy;  Surgeon: Bobbye Charleston, MD;  Location: [facility];  Service: Gynecology;  Laterality: Bilateral;  . UMBILICAL HERNIA REPAIR  2002    Social History   Socioeconomic History  . Marital status: Divorced    Spouse name: Not on file  . Number of children: Not on file  . Years of education: 3  . Highest education level: High school graduate  Occupational History  . Occupation: Disability  Tobacco Use  . Smoking status: Never Smoker  . Smokeless tobacco: Never Used  Vaping Use  . Vaping Use: Never used  Substance and Sexual Activity  . Alcohol use: No    Alcohol/week: 0.0 standard drinks  . Drug use: Never  . Sexual activity: Not on file    Comment: BTL w/ C/S in 1990  Other Topics Concern  .  Not on file  Social History Narrative   Divorced   Lives with son and grand-daughter   [place] (lives in [place])   [place] lives with patient   79- Daughter [name] (lives in [place])   Works at [employer]   Completed some college   Careers adviser   Social Determinants of Radio broadcast assistant Strain:   . Difficulty of Paying Living Expenses:   Food Insecurity:   . Worried About Charity fundraiser in the Last Year:   . Arboriculturist in the Last Year:   Transportation Needs:   . Lexicographer (Medical):   Marland Kitchen Lack of Transportation (Non-Medical):   Physical Activity:   . Days of Exercise per Week:   . Minutes of Exercise per Session:   Stress:   . Feeling of Stress :   Social Connections:   . Frequency of Communication with Friends and Family:   . Frequency of Social Gatherings with Friends and Family:   . Attends Religious Services:   . Active Member of Clubs or Organizations:   . Attends Archivist Meetings:   Marland Kitchen Marital Status:   Intimate Partner Violence:   . Fear of Current or Ex-Partner:   . Emotionally Abused:   Marland Kitchen Physically Abused:   . Sexually Abused:     No current facility-administered medications on file prior to encounter.   Current Outpatient Medications on File Prior to Encounter  Medication Sig Dispense Refill  . amLODipine (NORVASC) 2.5 MG tablet Take 3 tablets (7.5 mg total) by mouth daily. Please call patient as soon as rx is ready (Patient taking differently: Take 7.5 mg by mouth daily. Please call patient as soon as rx is ready) 90 tablet 5  . Artificial Tear Ointment (DRY EYES OP) Apply to eye as needed.    . Aspirin-Acetaminophen-Caffeine (EXCEDRIN MIGRAINE PO) Take by mouth as needed.    Marland Kitchen buPROPion (WELLBUTRIN XL) 300 MG 24 hr tablet Take 1 tablet by mouth once daily (Patient taking differently: Take 300 mg by mouth daily. ) 90 tablet 1  . Ca Phosphate-Cholecalciferol (CALTRATE GUMMY BITES PO) Take by mouth daily.     . Cholecalciferol (VITAMIN D3) 50 MCG (2000 UT) CHEW Chew 8,000 Units by mouth daily.    . Cyanocobalamin (B-12) 500 MCG TABS Take 1 tablet by mouth daily. 30 tablet   . lisinopril-hydrochlorothiazide (ZESTORETIC) 20-25 MG tablet Take 1 tablet by mouth once daily (Patient taking differently: Take 1 tablet by mouth daily. Take 1 tablet by mouth once daily) 90 tablet 1  . meloxicam (MOBIC) 7.5 MG tablet TAKE 1 TABLET BY MOUTH ONCE DAILY AS NEEDED FOR PAIN (Patient taking differently: Take 7.5 mg by mouth as  needed. ) 90 tablet 1  . Menthol, Topical Analgesic, (BIOFREEZE) 4 % GEL Apply 1 application topically daily.    . multivitamin-iron-minerals-folic acid (CENTRUM) chewable tablet Chew 1 tablet by mouth daily.    Marland Kitchen omeprazole (PRILOSEC) 40 MG capsule Take 1 capsule (40 mg total) by mouth daily. (Patient taking differently: Take 40 mg by mouth daily. ) 30 capsule 0  . venlafaxine XR (EFFEXOR-XR) 150 MG 24 hr capsule Take 150 mg by mouth daily with breakfast.    . cyclobenzaprine (FLEXERIL) 5 MG tablet Take 1 tablet (5 mg total) by mouth at bedtime as needed for muscle spasms. (Patient not taking: Reported on 10/06/2019) 15 tablet 0  . [DISCONTINUED] LISINOPRIL PO Take  by mouth.      Allergies  Allergen Reactions  . Penicillins Itching and Other (See Comments)    Has patient had a PCN reaction causing immediate rash, facial/tongue/throat swelling, SOB or lightheadedness with hypotension: No Has patient had a PCN reaction causing severe rash involving mucus membranes or skin necrosis: No Has patient had a PCN reaction that required hospitalization No Has patient had a PCN reaction occurring within the last 10 years: No If all of the above answers are "NO", then may proceed with Cephalosporin use.     Vitals:   10/03/19 0941 10/06/19 1554 10/06/19 1605  Weight: (!) 130.2 kg 128.4 kg 128.4 kg  Height: 5\' 4"  (1.626 m) 5\' 4"  (1.626 m) 5\' 4"  (1.626 m)    Lungs: clear to ascultation Cor:  RRR Abdomen:  soft, nontender, nondistended. Ex:  no cords, erythema Pelvic:   .   Vulva: no masses, no atrophy, no lesions .   Vagina: no tenderness, no erythema, no abnormal vaginal discharge, no rectocele, cystocele (very minor with valsalva, -3) .   Cervix: absent .   Uterus: absent .   Bladder/Urethra: no urethral discharge, no urethral mass, bladder non distended, Urethra hypermobile (now 45) .   Adnexa/Parametria: no parametrial tenderness, no parametrial mass, no adnexal tenderness, no ovarian  mass  A:  For TVT, A repair, cystoscopy.   P: P: All risks, benefits and alternatives d/w patient and she desires to proceed.  Patient has undergone a modified diet, ERAS protocol and will receive preop antibiotics and SCDs during the operation.   Daria Pastures

## 2019-10-12 ENCOUNTER — Ambulatory Visit (HOSPITAL_BASED_OUTPATIENT_CLINIC_OR_DEPARTMENT_OTHER): Payer: Medicare Other | Admitting: Physician Assistant

## 2019-10-12 ENCOUNTER — Ambulatory Visit (HOSPITAL_BASED_OUTPATIENT_CLINIC_OR_DEPARTMENT_OTHER): Payer: Medicare Other | Admitting: Anesthesiology

## 2019-10-12 ENCOUNTER — Encounter (HOSPITAL_BASED_OUTPATIENT_CLINIC_OR_DEPARTMENT_OTHER)
Admission: RE | Disposition: A | Discharge status: Home / Self Care | Payer: Self-pay | Source: Home / Self Care | Attending: Obstetrics and Gynecology

## 2019-10-12 ENCOUNTER — Ambulatory Visit (HOSPITAL_BASED_OUTPATIENT_CLINIC_OR_DEPARTMENT_OTHER)
Admission: RE | Admit: 2019-10-12 | Discharge: 2019-10-12 | Disposition: A | Discharge status: Home / Self Care | Payer: Medicare Other | Attending: Obstetrics and Gynecology | Admitting: Obstetrics and Gynecology

## 2019-10-12 ENCOUNTER — Other Ambulatory Visit: Payer: Self-pay

## 2019-10-12 ENCOUNTER — Encounter (HOSPITAL_BASED_OUTPATIENT_CLINIC_OR_DEPARTMENT_OTHER): Payer: Self-pay | Admitting: Obstetrics and Gynecology

## 2019-10-12 DIAGNOSIS — N8110 Cystocele, unspecified: Secondary | ICD-10-CM | POA: Diagnosis not present

## 2019-10-12 DIAGNOSIS — E559 Vitamin D deficiency, unspecified: Secondary | ICD-10-CM | POA: Insufficient documentation

## 2019-10-12 DIAGNOSIS — N816 Rectocele: Secondary | ICD-10-CM | POA: Diagnosis not present

## 2019-10-12 DIAGNOSIS — K219 Gastro-esophageal reflux disease without esophagitis: Secondary | ICD-10-CM | POA: Diagnosis not present

## 2019-10-12 DIAGNOSIS — F329 Major depressive disorder, single episode, unspecified: Secondary | ICD-10-CM | POA: Diagnosis not present

## 2019-10-12 DIAGNOSIS — Z791 Long term (current) use of non-steroidal anti-inflammatories (NSAID): Secondary | ICD-10-CM | POA: Diagnosis not present

## 2019-10-12 DIAGNOSIS — I10 Essential (primary) hypertension: Secondary | ICD-10-CM | POA: Diagnosis not present

## 2019-10-12 DIAGNOSIS — F411 Generalized anxiety disorder: Secondary | ICD-10-CM | POA: Diagnosis not present

## 2019-10-12 DIAGNOSIS — Z9884 Bariatric surgery status: Secondary | ICD-10-CM | POA: Insufficient documentation

## 2019-10-12 DIAGNOSIS — Z79899 Other long term (current) drug therapy: Secondary | ICD-10-CM | POA: Insufficient documentation

## 2019-10-12 DIAGNOSIS — G4733 Obstructive sleep apnea (adult) (pediatric): Secondary | ICD-10-CM | POA: Insufficient documentation

## 2019-10-12 DIAGNOSIS — Z6841 Body Mass Index (BMI) 40.0 and over, adult: Secondary | ICD-10-CM | POA: Diagnosis not present

## 2019-10-12 DIAGNOSIS — N393 Stress incontinence (female) (male): Secondary | ICD-10-CM | POA: Insufficient documentation

## 2019-10-12 HISTORY — DX: Personal history of other specified conditions: Z87.898

## 2019-10-12 HISTORY — PX: CYSTOSCOPY: SHX5120

## 2019-10-12 HISTORY — PX: BLADDER SUSPENSION: SHX72

## 2019-10-12 HISTORY — DX: Stress incontinence (female) (male): N39.3

## 2019-10-12 HISTORY — PX: CYSTOCELE REPAIR: SHX163

## 2019-10-12 HISTORY — DX: Cystocele, unspecified: N81.10

## 2019-10-12 HISTORY — DX: Gastro-esophageal reflux disease without esophagitis: K21.9

## 2019-10-12 HISTORY — DX: Rectocele: N81.6

## 2019-10-12 HISTORY — DX: Presence of spectacles and contact lenses: Z97.3

## 2019-10-12 LAB — TYPE AND SCREEN
ABO/RH(D): O POS
Antibody Screen: NEGATIVE

## 2019-10-12 SURGERY — URETHROPEXY, USING TRANSVAGINAL TAPE
Anesthesia: General | Site: Vagina

## 2019-10-12 MED ORDER — MEPIVACAINE HCL (PF) 2 % IJ SOLN
INTRAMUSCULAR | Status: DC | PRN
Start: 2019-10-12 — End: 2019-10-12
  Administered 2019-10-12: 3 mL via INTRATHECAL

## 2019-10-12 MED ORDER — ACETAMINOPHEN 500 MG PO TABS
ORAL_TABLET | ORAL | Status: AC
  Filled 2019-10-12: qty 2

## 2019-10-12 MED ORDER — GABAPENTIN 300 MG PO CAPS
300.0000 mg | ORAL_CAPSULE | ORAL | Status: AC
  Administered 2019-10-12: 300 mg via ORAL

## 2019-10-12 MED ORDER — CLINDAMYCIN PHOSPHATE 900 MG/50ML IV SOLN
INTRAVENOUS | Status: AC
  Filled 2019-10-12: qty 50

## 2019-10-12 MED ORDER — PROPOFOL 500 MG/50ML IV EMUL
INTRAVENOUS | Status: DC | PRN
Start: 2019-10-12 — End: 2019-10-12
  Administered 2019-10-12: 100 ug/kg/min via INTRAVENOUS

## 2019-10-12 MED ORDER — ONDANSETRON HCL 4 MG/2ML IJ SOLN
INTRAMUSCULAR | Status: AC
  Filled 2019-10-12: qty 2

## 2019-10-12 MED ORDER — ACETAMINOPHEN 500 MG PO TABS
1000.0000 mg | ORAL_TABLET | ORAL | Status: AC
  Administered 2019-10-12: 1000 mg via ORAL

## 2019-10-12 MED ORDER — MIDAZOLAM HCL 2 MG/2ML IJ SOLN
INTRAMUSCULAR | Status: AC
  Filled 2019-10-12: qty 2

## 2019-10-12 MED ORDER — OXYCODONE-ACETAMINOPHEN 5-325 MG PO TABS: 1.0000 | ORAL_TABLET | ORAL | 0 refills | Status: DC | PRN

## 2019-10-12 MED ORDER — LACTATED RINGERS IV SOLN: INTRAVENOUS | Status: DC

## 2019-10-12 MED ORDER — WHITE PETROLATUM EX OINT
TOPICAL_OINTMENT | CUTANEOUS | Status: AC
  Filled 2019-10-12: qty 5

## 2019-10-12 MED ORDER — LIDOCAINE-EPINEPHRINE 1 %-1:100000 IJ SOLN
INTRAMUSCULAR | Status: DC | PRN
  Administered 2019-10-12: 8 mL
  Administered 2019-10-12: 10 mL

## 2019-10-12 MED ORDER — FENTANYL CITRATE (PF) 100 MCG/2ML IJ SOLN
INTRAMUSCULAR | Status: DC | PRN
  Administered 2019-10-12: 50 ug via INTRAVENOUS

## 2019-10-12 MED ORDER — PHENYLEPHRINE HCL (PRESSORS) 10 MG/ML IV SOLN
INTRAVENOUS | Status: AC
  Filled 2019-10-12: qty 2

## 2019-10-12 MED ORDER — HYDRALAZINE HCL 20 MG/ML IJ SOLN
INTRAMUSCULAR | Status: DC | PRN
Start: 2019-10-12 — End: 2019-10-12
  Administered 2019-10-12: 5 mg via INTRAVENOUS

## 2019-10-12 MED ORDER — PROPOFOL 10 MG/ML IV BOLUS
INTRAVENOUS | Status: AC
  Filled 2019-10-12: qty 20

## 2019-10-12 MED ORDER — ROCURONIUM BROMIDE 10 MG/ML (PF) SYRINGE
PREFILLED_SYRINGE | INTRAVENOUS | Status: AC
  Filled 2019-10-12: qty 10

## 2019-10-12 MED ORDER — FENTANYL CITRATE (PF) 100 MCG/2ML IJ SOLN
INTRAMUSCULAR | Status: AC
  Filled 2019-10-12: qty 2

## 2019-10-12 MED ORDER — CLINDAMYCIN PHOSPHATE 900 MG/50ML IV SOLN
900.0000 mg | INTRAVENOUS | Status: AC
  Administered 2019-10-12: 900 mg via INTRAVENOUS

## 2019-10-12 MED ORDER — SOD CITRATE-CITRIC ACID 500-334 MG/5ML PO SOLN: 30.0000 mL | ORAL | Status: DC

## 2019-10-12 MED ORDER — ESTRADIOL 0.1 MG/GM VA CREA
TOPICAL_CREAM | VAGINAL | Status: DC | PRN
  Administered 2019-10-12: 1 via VAGINAL

## 2019-10-12 MED ORDER — CIPROFLOXACIN IN D5W 400 MG/200ML IV SOLN
INTRAVENOUS | Status: AC
  Filled 2019-10-12: qty 200

## 2019-10-12 MED ORDER — INDIGOTINDISULFONATE SODIUM 8 MG/ML IJ SOLN
INTRAMUSCULAR | Status: DC | PRN
  Administered 2019-10-12: 1 mL via INTRAVENOUS

## 2019-10-12 MED ORDER — HYDROMORPHONE HCL 1 MG/ML IJ SOLN: 0.2500 mg | INTRAMUSCULAR | Status: DC | PRN

## 2019-10-12 MED ORDER — ONDANSETRON HCL 4 MG/2ML IJ SOLN
4.0000 mg | Freq: Once | INTRAMUSCULAR | Status: AC
  Administered 2019-10-12: 4 mg via INTRAVENOUS

## 2019-10-12 MED ORDER — CIPROFLOXACIN IN D5W 400 MG/200ML IV SOLN
400.0000 mg | INTRAVENOUS | Status: AC
  Administered 2019-10-12: 400 mg via INTRAVENOUS

## 2019-10-12 MED ORDER — GABAPENTIN 300 MG PO CAPS
ORAL_CAPSULE | ORAL | Status: AC
  Filled 2019-10-12: qty 1

## 2019-10-12 MED ORDER — SODIUM CHLORIDE 0.9 % IR SOLN
Status: DC | PRN
  Administered 2019-10-12: 1000 mL via INTRAVESICAL

## 2019-10-12 MED ORDER — LIDOCAINE 2% (20 MG/ML) 5 ML SYRINGE
INTRAMUSCULAR | Status: AC
  Filled 2019-10-12: qty 5

## 2019-10-12 MED ORDER — POVIDONE-IODINE 10 % EX SWAB: 2.0000 "application " | Freq: Once | CUTANEOUS | Status: DC

## 2019-10-12 MED ORDER — MIDAZOLAM HCL 2 MG/2ML IJ SOLN
INTRAMUSCULAR | Status: DC | PRN
  Administered 2019-10-12: 1 mg via INTRAVENOUS

## 2019-10-12 SURGICAL SUPPLY — 30 items
ADH SKN CLS APL DERMABOND .7 (GAUZE/BANDAGES/DRESSINGS) ×3
AGENT HMST KT MTR STRL THRMB (HEMOSTASIS) ×3
BLADE SURG 15 STRL LF DISP TIS (BLADE) ×6 IMPLANT
BLADE SURG 15 STRL SS (BLADE) ×8
CANISTER SUCT 3000ML PPV (MISCELLANEOUS) ×4 IMPLANT
COVER WAND RF STERILE (DRAPES) ×4 IMPLANT
DERMABOND ADVANCED (GAUZE/BANDAGES/DRESSINGS) ×1
DERMABOND ADVANCED .7 DNX12 (GAUZE/BANDAGES/DRESSINGS) ×3 IMPLANT
GAUZE PACKING 2X5 YD STRL (GAUZE/BANDAGES/DRESSINGS) ×4 IMPLANT
GLOVE BIO SURGEON STRL SZ7 (GLOVE) ×4 IMPLANT
GLOVE BIOGEL PI IND STRL 7.0 (GLOVE) ×6 IMPLANT
GLOVE BIOGEL PI INDICATOR 7.0 (GLOVE) ×2
GOWN STRL REUS W/TWL LRG LVL3 (GOWN DISPOSABLE) ×12 IMPLANT
MARKER SKIN DUAL TIP RULER LAB (MISCELLANEOUS) IMPLANT
NEEDLE HYPO 22GX1.5 SAFETY (NEEDLE) ×6 IMPLANT
NS IRRIG 1000ML POUR BTL (IV SOLUTION) ×2 IMPLANT
NS IRRIG 500ML POUR BTL (IV SOLUTION) ×2 IMPLANT
PACK VAGINAL WOMENS (CUSTOM PROCEDURE TRAY) ×4 IMPLANT
SET IRRIG Y TYPE TUR BLADDER L (SET/KITS/TRAYS/PACK) ×4 IMPLANT
SLING TRANS VAGINAL TAPE (Sling) ×1 IMPLANT
SLING UTERINE/ABD GYNECARE TVT (Sling) ×3 IMPLANT
SURGIFLO W/THROMBIN 8M KIT (HEMOSTASIS) ×2 IMPLANT
SUT VIC AB 0 CT1 18XCR BRD8 (SUTURE) ×3 IMPLANT
SUT VIC AB 0 CT1 8-18 (SUTURE) ×4
SUT VIC AB 2-0 CT1 (SUTURE) ×2 IMPLANT
SUT VIC AB 2-0 CT1 27 (SUTURE)
SUT VIC AB 2-0 CT1 TAPERPNT 27 (SUTURE) ×2 IMPLANT
SUT VIC AB 2-0 UR6 27 (SUTURE) ×2 IMPLANT
TOWEL OR 17X26 10 PK STRL BLUE (TOWEL DISPOSABLE) ×8 IMPLANT
TRAY FOLEY W/BAG SLVR 14FR (SET/KITS/TRAYS/PACK) ×4 IMPLANT

## 2019-10-12 NOTE — Brief Op Note (Signed)
10/12/2019  11:06 AM  PATIENT:  Hailey Vance  54 y.o. female  PRE-OPERATIVE DIAGNOSIS:  STRESS URINARY INCONTINENCE  POST-OPERATIVE DIAGNOSIS:  STRESS URINARY INCONTINENCE  PROCEDURE:  Procedure(s): TRANSVAGINAL TAPE (TVT) PROCEDURE (N/A) ANTERIOR (CYSTOCELE) AND POSTERIOR REPAIR (RECTOCELE) (N/A) CYSTOSCOPY (N/A)  SURGEON:  Surgeon(s) and Role:    * Bobbye Charleston, MD - Primary    * Taam-Akelman, Lawrence Santiago, MD - Assisting  ANESTHESIA:   spinal  EBL:  50 mL   LOCAL MEDICATIONS USED:  LIDOCAINE with epi, surgiflo, estrace  SPECIMEN:  No Specimen  DISPOSITION OF SPECIMEN:  N/A  COUNTS:  YES  TOURNIQUET:  * No tourniquets in log *  DICTATION: .Note written in EPIC  PLAN OF CARE: Discharge to home after PACU  PATIENT DISPOSITION:  PACU - hemodynamically stable.   Delay start of Pharmacological VTE agent (>24hrs) due to surgical blood loss or risk of bleeding: not applicable

## 2019-10-12 NOTE — Anesthesia Procedure Notes (Signed)
Spinal  Patient location during procedure: OR Start time: 10/12/2019 10:08 AM End time: 10/12/2019 10:12 AM Staffing Performed: anesthesiologist  Anesthesiologist: Roderic Palau, MD Preanesthetic Checklist Completed: patient identified, IV checked, risks and benefits discussed, surgical consent, monitors and equipment checked, pre-op evaluation and timeout performed Spinal Block Patient position: sitting Prep: Betadine Patient monitoring: cardiac monitor, continuous pulse ox and blood pressure Approach: midline Location: L3-4 Injection technique: single-shot Needle Needle type: Pencan  Needle gauge: 24 G Needle length: 9 cm Assessment Sensory level: T8 Additional Notes Functioning IV was confirmed and monitors were applied. Sterile prep and drape, including hand hygiene and sterile gloves were used. The patient was positioned and the spine was prepped. The skin was anesthetized with lidocaine.  Free flow of clear CSF was obtained prior to injecting local anesthetic into the CSF.  The spinal needle aspirated freely following injection.  The needle was carefully withdrawn.  The patient tolerated the procedure well.

## 2019-10-12 NOTE — Discharge Instructions (Signed)
Remove vaginal packing tomorrow morning by gently pulling it out like removing a tampon. Return to the office on Friday morning for catheter removal and voiding trial. Please call the office tomorrow to let them know you'll be coming.  Indwelling Urinary Catheter Care, Adult An indwelling urinary catheter is a thin tube that is put into your bladder. The tube helps to drain pee (urine) out of your body. The tube goes in through your urethra. Your urethra is where pee comes out of your body. Your pee will come out through the catheter, then it will go into a bag (drainage bag). Take good care of your catheter so it will work well. How to wear your catheter and bag Supplies needed  Sticky tape (adhesive tape) or a leg strap.  Alcohol wipe or soap and water (if you use tape).  A clean towel (if you use tape).  Large overnight bag.  Smaller bag (leg bag). Wearing your catheter Attach your catheter to your leg with tape or a leg strap.  Make sure the catheter is not pulled tight.  If a leg strap gets wet, take it off and put on a dry strap.  If you use tape to hold the bag on your leg: 1. Use an alcohol wipe or soap and water to wash your skin where the tape made it sticky before. 2. Use a clean towel to pat-dry that skin. 3. Use new tape to make the bag stay on your leg. Wearing your bags You should have been given a large overnight bag.  You may wear the overnight bag in the day or night.  Always have the overnight bag lower than your bladder.  Do not let the bag touch the floor.  Before you go to sleep, put a clean plastic bag in a wastebasket. Then hang the overnight bag inside the wastebasket. You should also have a smaller leg bag that fits under your clothes.  Always wear the leg bag below your knee.  Do not wear your leg bag at night. How to care for your skin and catheter Supplies needed  A clean washcloth.  Water and mild soap.  A clean towel. Caring for your  skin and catheter      Clean the skin around your catheter every day: 1. Wash your hands with soap and water. 2. Wet a clean washcloth in warm water and mild soap. 3. Clean the skin around your urethra.  If you are female:  Gently spread the folds of skin around your vagina (labia).  With the washcloth in your other hand, wipe the inner side of your labia on each side. Wipe from front to back.  If you are female:  Pull back any skin that covers the end of your penis (foreskin).  With the washcloth in your other hand, wipe your penis in small circles. Start wiping at the tip of your penis, then move away from the catheter.  Move the foreskin back in place, if needed. 4. With your free hand, hold the catheter close to where it goes into your body.  Keep holding the catheter during cleaning so it does not get pulled out. 5. With the washcloth in your other hand, clean the catheter.  Only wipe downward on the catheter.  Do not wipe upward toward your body. Doing this may push germs into your urethra and cause infection. 6. Use a clean towel to pat-dry the catheter and the skin around it. Make sure to wipe off all  soap. 7. Wash your hands with soap and water.  Shower every day. Do not take baths.  Do not use cream, ointment, or lotion on the area where the catheter goes into your body, unless your doctor tells you to.  Do not use powders, sprays, or lotions on your genital area.  Check your skin around the catheter every day for signs of infection. Check for: ? Redness, swelling, or pain. ? Fluid or blood. ? Warmth. ? Pus or a bad smell. How to empty the bag Supplies needed  Rubbing alcohol.  Gauze pad or cotton ball.  Tape or a leg strap. Emptying the bag Pour the pee out of your bag when it is ?- full, or at least 2-3 times a day. Do this for your overnight bag and your leg bag. 1. Wash your hands with soap and water. 2. Separate (detach) the bag from your  leg. 3. Hold the bag over the toilet or a clean pail. Keep the bag lower than your hips and bladder. This is so the pee (urine) does not go back into the tube. 4. Open the pour spout. It is at the bottom of the bag. 5. Empty the pee into the toilet or pail. Do not let the pour spout touch any surface. 6. Put rubbing alcohol on a gauze pad or cotton ball. 7. Use the gauze pad or cotton ball to clean the pour spout. 8. Close the pour spout. 9. Attach the bag to your leg with tape or a leg strap. 10. Wash your hands with soap and water. Follow instructions for cleaning the drainage bag:  From the product maker.  As told by your doctor. How to change the bag Supplies needed  Alcohol wipes.  A clean bag.  Tape or a leg strap. Changing the bag Replace your bag when it starts to leak, smell bad, or look dirty. 1. Wash your hands with soap and water. 2. Separate the dirty bag from your leg. 3. Pinch the catheter with your fingers so that pee does not spill out. 4. Separate the catheter tube from the bag tube where these tubes connect (at the connection valve). Do not let the tubes touch any surface. 5. Clean the end of the catheter tube with an alcohol wipe. Use a different alcohol wipe to clean the end of the bag tube. 6. Connect the catheter tube to the tube of the clean bag. 7. Attach the clean bag to your leg with tape or a leg strap. Do not make the bag tight on your leg. 8. Wash your hands with soap and water. General rules   Never pull on your catheter. Never try to take it out. Doing that can hurt you.  Always wash your hands before and after you touch your catheter or bag. Use a mild, fragrance-free soap. If you do not have soap and water, use hand sanitizer.  Always make sure there are no twists or bends (kinks) in the catheter tube.  Always make sure there are no leaks in the catheter or bag.  Drink enough fluid to keep your pee pale yellow.  Do not take baths, swim,  or use a hot tub.  If you are female, wipe from front to back after you poop (have a bowel movement). Contact a doctor if:  Your pee is cloudy.  Your pee smells worse than usual.  Your catheter gets clogged.  Your catheter leaks.  Your bladder feels full. Get help right away if:  You have redness, swelling, or pain where the catheter goes into your body.  You have fluid, blood, pus, or a bad smell coming from the area where the catheter goes into your body.  Your skin feels warm where the catheter goes into your body.  You have a fever.  You have pain in your: ? Belly (abdomen). ? Legs. ? Lower back. ? Bladder.  You see blood in the catheter.  Your pee is pink or red.  You feel sick to your stomach (nauseous).  You throw up (vomit).  You have chills.  Your pee is not draining into the bag.  Your catheter gets pulled out. Summary  An indwelling urinary catheter is a thin tube that is placed into the bladder to help drain pee (urine) out of the body.  The catheter is placed into the part of the body that drains pee from the bladder (urethra).  Taking good care of your catheter will keep it working properly and help prevent problems.  Always wash your hands before and after touching your catheter or bag.  Never pull on your catheter or try to take it out. This information is not intended to replace advice given to you by your health care provider. Make sure you discuss any questions you have with your health care provider. Document Revised: 06/11/2018 Document Reviewed: 10/03/2016 Elsevier Patient Education  2020 [employer] CARE INSTRUCTIONS FOR VAGINAL SLING  Activity:  -No lifting greater than 10-15 pounds for 1 week, or as instructed by your physician.             -No sexual intercourse until your f/u visit Diet:  You may return to your normal diet tomorrow.   It is important to keep your bowels regular during the postoperative period.  To  avoid constipation, drink plenty of fluids during the day (8-10 glasses) and eat plenty of fresh fruits and vegetables.  Use a mild laxative or stool softener if necessary.  Wound Care:  You may begin showering tomorrow, or as instructed by your physician.       Return to Work as instructed by your physician.    Special Instructions:   Call your physician if any of these symptoms occur:   -temperature greater than 101 degrees Farenheit.   -redness, swelling or drainage at incision site.   -foul odor of your urine.   -a significant decrease in the amount of urine you have every day.   -severe pain not relieved by your pain medication.   Post Anesthesia Home Care Instructions  Activity: Get plenty of rest for the remainder of the day. A responsible individual must stay with you for 24 hours following the procedure.  For the next 24 hours, DO NOT: -Drive a car -Paediatric nurse -Drink alcoholic beverages -Take any medication unless instructed by your physician -Make any legal decisions or sign important papers.  Meals: Start with liquid foods such as gelatin or soup. Progress to regular foods as tolerated. Avoid greasy, spicy, heavy foods. If nausea and/or vomiting occur, drink only clear liquids until the nausea and/or vomiting subsides. Call your physician if vomiting continues.  Special Instructions/Symptoms: Your throat may feel dry or sore from the anesthesia or the breathing tube placed in your throat during surgery. If this causes discomfort, gargle with warm salt water. The discomfort should disappear within 24 hours.  No Tylenol until 3:30 pm today.

## 2019-10-12 NOTE — Anesthesia Postprocedure Evaluation (Signed)
Anesthesia Post Note  Patient: Hailey Vance  Procedure(s) Performed: TRANSVAGINAL TAPE (TVT) PROCEDURE (N/A Vagina ) ANTERIOR (CYSTOCELE) (N/A Vagina ) CYSTOSCOPY (N/A Bladder)     Patient location during evaluation: PACU Anesthesia Type: Spinal and MAC Level of consciousness: oriented and awake and alert Pain management: pain level controlled Vital Signs Assessment: post-procedure vital signs reviewed and stable Respiratory status: spontaneous breathing and respiratory function stable Cardiovascular status: blood pressure returned to baseline and stable Postop Assessment: no headache, no backache, no apparent nausea or vomiting, spinal receding and patient able to bend at knees Anesthetic complications: no   No complications documented.  Last Vitals:  Vitals:   10/12/19 1315 10/12/19 1330  BP: (!) 129/93 104/80  Pulse: 67 72  Resp: 13 (!) 25  Temp:    SpO2: 100% 99%    Last Pain:  Vitals:   10/12/19 1330  TempSrc:   PainSc: 0-No pain                 Zendaya Groseclose,W. EDMOND

## 2019-10-12 NOTE — Progress Notes (Signed)
There has been no change in the patients history, status or exam since the history and physical.  Vitals:   10/03/19 0941 10/06/19 1554 10/06/19 1605 10/12/19 0857  Pulse:    75  Resp:    18  Temp:    98 F (36.7 C)  TempSrc:    Oral  SpO2:    98%  Weight: (!) 130.2 kg 128.4 kg 128.4 kg 127.3 kg  Height: 5\' 4"  (1.626 m) 5\' 4"  (1.626 m) 5\' 4"  (1.626 m) 5\' 4"  (1.626 m)    Results for orders placed or performed during the hospital encounter of 10/10/19 (from the past 72 hour(s))  SARS CORONAVIRUS 2 (TAT 6-24 HRS) Nasopharyngeal Nasopharyngeal Swab     Status: None   Collection Time: 10/10/19  2:50 PM   Specimen: Nasopharyngeal Swab  Result Value Ref Range   SARS Coronavirus 2 NEGATIVE NEGATIVE    Comment: (NOTE) SARS-CoV-2 target nucleic acids are NOT DETECTED.  The SARS-CoV-2 RNA is generally detectable in upper and lower respiratory specimens during the acute phase of infection. Negative results do not preclude SARS-CoV-2 infection, do not rule out co-infections with other pathogens, and should not be used as the sole basis for treatment or other patient management decisions. Negative results must be combined with clinical observations, patient history, and epidemiological information. The expected result is Negative.  Fact Sheet for Patients: SugarRoll.be  Fact Sheet for Healthcare Providers: https://www.woods-mathews.com/  This test is not yet approved or cleared by the [place] FDA and  has been authorized for detection and/or diagnosis of SARS-CoV-2 by FDA under an Emergency Use Authorization (EUA). This EUA will remain  in effect (meaning this test can be used) for the duration of the COVID-19 declaration under Se ction 564(b)(1) of the Act, 21 U.S.C. section 360bbb-3(b)(1), unless the authorization is terminated or revoked sooner.  Performed at [facility], [place] [address] [place], [place] [address]      Daria Pastures

## 2019-10-12 NOTE — Op Note (Signed)
10/12/2019  11:06 AM  PATIENT:  Hailey Vance  54 y.o. female  PRE-OPERATIVE DIAGNOSIS:  STRESS URINARY INCONTINENCE  POST-OPERATIVE DIAGNOSIS:  STRESS URINARY INCONTINENCE  PROCEDURE:  Procedure(s): TRANSVAGINAL TAPE (TVT) PROCEDURE (N/A) ANTERIOR (CYSTOCELE) AND POSTERIOR REPAIR (RECTOCELE) (N/A) CYSTOSCOPY (N/A)  SURGEON:  Surgeon(s) and Role:    * Bobbye Charleston, MD - Primary    * Taam-Akelman, Lawrence Santiago, MD - Assisting  ANESTHESIA:   spinal  EBL:  50 mL   LOCAL MEDICATIONS USED:  LIDOCAINE with epi, surgiflo, estrace  SPECIMEN:  No Specimen  DISPOSITION OF SPECIMEN:  N/A  COUNTS:  YES  TOURNIQUET:  * No tourniquets in log *  DICTATION: .Note written in EPIC  PLAN OF CARE: Discharge to home after PACU  PATIENT DISPOSITION:  PACU - hemodynamically stable.   Delay start of Pharmacological VTE agent (>24hrs) due to surgical blood loss or risk of bleeding: not applicable  After spinal anesthesia was administered, the patient was prepped and draped in the usual sterile fashion.  The uterus was well suspended and we elected to leave it in place.  A foley was placed in the urethra and the apex of the cystocele grasped with two allises.  Allises were used to grasp the vaginal mucosa in the midline superior to inferior just below the urethral opening.  The mucosa was injected with 1% lidocaine with epi in the midline and a scalpel used to incise the vaginal mucosa vertically.  Allises were used to retract the vaginal mucosa as the vesico-vaginal fascia was removed from the mucosa with blunt and sharp dissection and adequate room midurethral to pelvic floor bilaterally was developed.  Two small puncture incisions were then made two cm from midline just above the pubic symphysis.  The abdominal needles from the Gynecare TVT set were introduced behind the pubic bone, through the pelvic floor and out the vagina carefully on each respective side, being careful to hug the posterior of  the pubic bone.  The foley was removed and indigo carmine given.  Cystoscopy revealed excellent bilateral spill from each ureteral orifice and NO NEEDLES IN THE BLADDER.   The urethra was clear as well.  The foley was replaced and the vaginal needles attached to the abdominal needles.  The tape was pulled into place through the abdominal incisions, the sheaths removed while a kelly was in place under the mesh sling and the tape cut at the level of just below the skin.  The tape was checked and found to be tight enough and laying flat.   Surgiflo was placed in the corners up by the pelvic floor to achieve hemostasis of some small bleeders out of reach and too close to the sling for stitches.  Once hemostasis was achieved, three mattress stitches of 0-vicryl were placed to close the vesico-vaginal fascia under the bladder.  The vaginal mucosa was trimmed and then closed with a running lock stitch of 2-0 vicryl.  A vaginal packing with estrace was placed and the patient returned to the recovery room in stable condition.

## 2019-10-12 NOTE — Transfer of Care (Signed)
Immediate Anesthesia Transfer of Care Note  Patient: Hailey Vance  Procedure(s) Performed: Procedure(s) (LRB): TRANSVAGINAL TAPE (TVT) PROCEDURE (N/A) ANTERIOR (CYSTOCELE) AND POSTERIOR REPAIR (RECTOCELE) (N/A) CYSTOSCOPY (N/A)  Patient Location: PACU  Anesthesia Type: General  Level of Consciousness: awake, oriented, sedated and patient cooperative  Airway & Oxygen Therapy: Patient Spontanous Breathing and Patient connected to face mask oxygen  Post-op Assessment: Report given to PACU RN and Post -op Vital signs reviewed and stable  Post vital signs: Reviewed and stable  Complications: No apparent anesthesia complications Last Vitals:  Vitals Value Taken Time  BP 137/87 10/12/19 1117  Temp    Pulse 75 10/12/19 1127  Resp 21 10/12/19 1127  SpO2 99 % 10/12/19 1127  Vitals shown include unvalidated device data.  Last Pain:  Vitals:   10/12/19 1117  TempSrc:   PainSc: (P) 0-No pain         Complications: No complications documented.

## 2019-10-13 ENCOUNTER — Encounter (HOSPITAL_BASED_OUTPATIENT_CLINIC_OR_DEPARTMENT_OTHER): Payer: Self-pay | Admitting: Obstetrics and Gynecology

## 2019-10-14 ENCOUNTER — Telehealth: Payer: Self-pay

## 2019-10-14 ENCOUNTER — Ambulatory Visit (INDEPENDENT_AMBULATORY_CARE_PROVIDER_SITE_OTHER): Payer: Medicare Other | Admitting: Otolaryngology

## 2019-10-14 ENCOUNTER — Other Ambulatory Visit: Payer: Self-pay

## 2019-10-14 ENCOUNTER — Encounter (INDEPENDENT_AMBULATORY_CARE_PROVIDER_SITE_OTHER): Payer: Self-pay | Admitting: Otolaryngology

## 2019-10-14 VITALS — Temp 95.5°F

## 2019-10-14 DIAGNOSIS — R490 Dysphonia: Secondary | ICD-10-CM

## 2019-10-14 DIAGNOSIS — K219 Gastro-esophageal reflux disease without esophagitis: Secondary | ICD-10-CM | POA: Diagnosis not present

## 2019-10-14 NOTE — Progress Notes (Signed)
HPI: Hailey Vance is a 54 y.o. female who presents is referred by her PCP2 for evaluation of chronic throat complaints and intermittent hoarseness. She states that she has to clear her throat a lot and frequently gets choked when she eats. She coughs up debris from her throat on a daily basis and shows pictures of thick yellow mucus and some white chunks. She is very obese and working on losing weight. She has minimal nasal symptoms. She has occasional sore throat that comes and go but doesn't last long. Several years ago she had several tonsil infections but has not had any tonsil infections recently. She states that her voice comes and goes but she is having no hoarseness today. Denies sore throat today. She does not smoke.  Past Medical History:  Diagnosis Date  . Bilateral primary osteoarthritis of knee 03/29/2019  . Chronic back pain   . Cystocele with rectocele   . Generalized anxiety disorder   . GERD (gastroesophageal reflux disease)   . Headache    otc med prn  . Heart murmur    echo in epic 11-26-2017  normal w/ ef 55-60%  . History of chest pain    pt referred for atyical chest pain , cardiology evaluation w/ dr berry note in epic 10-25-2014, stated with low risk w/ no ischemia , nuclear ef 70% , done 09-19-2014 in epic,  non-cardiac chest pain  . HTN (hypertension)    followed by pcp    (normal nuclear stress test 09-19-2014 in epic)  . Low blood potassium   . Major depressive disorder   . OSA (obstructive sleep apnea)    04/2015 study in e- Severe OSA (AHI = 55.0/hour);   (10-06-2019  per pt has not used cpap since 2019 after gastric bypass)  . Osteoarthritis of both hips 06/15/2019  . Plantar fasciitis of left foot   . S/P gastric bypass 02/15/2018   sleeve  . SUI (stress urinary incontinence, female)   . Vitamin D deficiency 09/06/2014  . Wears glasses    Past Surgical History:  Procedure Laterality Date  . BLADDER SUSPENSION N/A 10/12/2019   Procedure: TRANSVAGINAL TAPE  (TVT) PROCEDURE;  Surgeon: Bobbye Charleston, MD;  Location: [facility];  Service: Gynecology;  Laterality: N/A;  . CESAREAN SECTION W/BTL  1990  . COLONOSCOPY WITH PROPOFOL N/A 08/30/2015   Procedure: COLONOSCOPY WITH PROPOFOL;  Surgeon: Mauri Pole, MD;  Location: [facility];  Service: Endoscopy;  Laterality: N/A;  . CYSTOCELE REPAIR N/A 10/12/2019   Procedure: ANTERIOR (CYSTOCELE);  Surgeon: Bobbye Charleston, MD;  Location: [facility];  Service: Gynecology;  Laterality: N/A;  . CYSTOSCOPY N/A 05/23/2015   Procedure: CYSTOSCOPY;  Surgeon: Bobbye Charleston, MD;  Location: [facility];  Service: Gynecology;  Laterality: N/A;  . CYSTOSCOPY N/A 10/12/2019   Procedure: CYSTOSCOPY;  Surgeon: Bobbye Charleston, MD;  Location[facility];  Service: Gynecology;  Laterality: N/A;  . HIATAL HERNIA REPAIR N/A 02/15/2018   Procedure: HERNIA REPAIR HIATAL;  Surgeon: Excell Seltzer, MD;  Location: [facility];  Service: General;  Laterality: N/A;  . HYSTEROSCOPY WITH D & C N/A 01/11/2015   Procedure: DILATATION AND CURETTAGE /HYSTEROSCOPY;  Surgeon: Jerelyn Charles, MD;  Location: [facility];  Service: Gynecology;  Laterality: N/A;  . KNEE ARTHROSCOPY Right 2010  . LAPAROSCOPIC GASTRIC SLEEVE RESECTION N/A 02/15/2018   Procedure: LAPAROSCOPIC GASTRIC SLEEVE RESECTION WITH UPPER ENDO AND ERAS PATHWAY;  Surgeon: Excell Seltzer, MD;  Location: [facility];  Service: General;  Laterality: N/A;  . LAPAROSCOPIC OOPHORECTOMY Bilateral 2004   LEFT OOPHORECTOMY AND PARTIAL RIGHT OOPHORECTOMY  . LIPOMA EXCISION Left 12/02/2016   Procedure: EXCISION LIPOMA OF LEFT SHOULDER;  Surgeon: Garald Balding, MD;  Location: [facility];  Service: Orthopedics;  Laterality: Left;  . ROBOTIC ASSISTED TOTAL HYSTERECTOMY WITH SALPINGECTOMY Bilateral 05/23/2015   Procedure: ROBOTIC ASSISTED TOTAL HYSTERECTOMY WITH SALPINGECTOMYwith right oophorectomy;  Surgeon: Bobbye Charleston, MD;  Location: [facility];  Service: Gynecology;  Laterality: Bilateral;  . UMBILICAL HERNIA REPAIR  2002   Social History   Socioeconomic History  . Marital status: Divorced    Spouse name: Not on file  . Number of children: Not on file  . Years of education: 37  . Highest education level: High school graduate  Occupational History  . Occupation: Disability  Tobacco Use  . Smoking status: Never Smoker  . Smokeless tobacco: Never Used  Vaping Use  . Vaping Use: Never used  Substance and Sexual Activity  . Alcohol use: No    Alcohol/week: 0.0 standard drinks  . Drug use: Never  . Sexual activity: Not on file    Comment: BTL w/ C/S in 1990  Other Topics Concern  . Not on file  Social History Narrative   Divorced   Lives with son and grand-daughter   [place] (lives in [place])   [place] lives with patient   49- Daughter [name] (lives in [place])   Works at [employer]   Completed some college   Careers adviser   Social Determinants of Radio broadcast assistant Strain:   . Difficulty of Paying Living Expenses:   Food Insecurity:   . Worried About Charity fundraiser in the Last Year:   . Arboriculturist in the Last Year:   Transportation Needs:   . Film/video editor (Medical):   Marland Kitchen Lack of Transportation (Non-Medical):   Physical Activity:   . Days of Exercise per Week:   . Minutes of Exercise per Session:   Stress:   . Feeling of Stress :   Social Connections:   . Frequency of Communication with Friends and Family:   . Frequency of Social Gatherings with Friends and Family:   . Attends Religious Services:   . Active Member of Clubs or Organizations:   . Attends Archivist Meetings:   Marland Kitchen Marital Status:    Family History  Problem Relation Age of Onset  . Lung cancer Mother 79       dec  . Cancer Mother   . Hypertension Mother   . Arthritis Mother   . Cancer Father 33       prostate  . Hypertension Father   .  Leukemia Sister   . Hypertension Sister   . Arthritis Sister   . Hyperlipidemia Sister   . Hypertension Brother   . Arthritis Brother   . Multiple sclerosis Sister   . Hypertension Sister   . Hypertension Sister   . Hypertension Brother   . Hypertension Brother   . Breast cancer Paternal Aunt   . Alzheimer's disease Paternal Aunt   . Diabetes Maternal Grandmother   . Stroke Maternal Grandmother   . Heart attack Paternal Grandmother   . Asthma Other    Allergies  Allergen Reactions  . Penicillins Itching and Other (See Comments)    Has patient had a PCN reaction causing immediate rash, facial/tongue/throat swelling, SOB or lightheadedness with hypotension: No  Has patient had a PCN reaction causing severe rash involving mucus membranes or skin necrosis: No Has patient had a PCN reaction that required hospitalization No Has patient had a PCN reaction occurring within the last 10 years: No If all of the above answers are "NO", then may proceed with Cephalosporin use.    Prior to Admission medications   Medication Sig Start Date End Date Taking? Authorizing Provider  amLODipine (NORVASC) 2.5 MG tablet Take 3 tablets (7.5 mg total) by mouth daily. Please call patient as soon as rx is ready Patient taking differently: Take 7.5 mg by mouth daily. Please call patient as soon as rx is ready 05/06/19  Yes Debbrah Alar, NP  Artificial Tear Ointment (DRY EYES OP) Apply to eye as needed.   Yes [provider]  buPROPion (WELLBUTRIN XL) 300 MG 24 hr tablet Take 1 tablet by mouth once daily Patient taking differently: Take 300 mg by mouth daily.  05/16/19  Yes Debbrah Alar, NP  Ca Phosphate-Cholecalciferol (CALTRATE GUMMY BITES PO) Take by mouth daily.    Yes [provider]  Cholecalciferol (VITAMIN D3) 50 MCG (2000 UT) CHEW Chew 8,000 Units by mouth daily.   Yes [provider]  Cyanocobalamin (B-12) 500 MCG TABS Take 1 tablet by mouth daily. 06/16/19  Yes  Debbrah Alar, NP  cyclobenzaprine (FLEXERIL) 5 MG tablet Take 1 tablet (5 mg total) by mouth at bedtime as needed for muscle spasms. 04/11/19  Yes Debbrah Alar, NP  lisinopril-hydrochlorothiazide (ZESTORETIC) 20-25 MG tablet Take 1 tablet by mouth once daily Patient taking differently: Take 1 tablet by mouth daily. Take 1 tablet by mouth once daily 07/15/19  Yes Debbrah Alar, NP  meloxicam (MOBIC) 7.5 MG tablet TAKE 1 TABLET BY MOUTH ONCE DAILY AS NEEDED FOR PAIN Patient taking differently: Take 7.5 mg by mouth as needed.  05/16/19  Yes Debbrah Alar, NP  Menthol, Topical Analgesic, (BIOFREEZE) 4 % GEL Apply 1 application topically daily.   Yes [provider]  multivitamin-iron-minerals-folic acid (CENTRUM) chewable tablet Chew 1 tablet by mouth daily.   Yes [provider]  omeprazole (PRILOSEC) 40 MG capsule Take 1 capsule (40 mg total) by mouth daily. Patient taking differently: Take 40 mg by mouth daily.  04/11/19  Yes Debbrah Alar, NP  oxyCODONE-acetaminophen (PERCOCET/ROXICET) 5-325 MG tablet Take 1 tablet by mouth every 4 (four) hours as needed for severe pain. 10/12/19  Yes Bobbye Charleston, MD  venlafaxine XR (EFFEXOR-XR) 150 MG 24 hr capsule Take 150 mg by mouth daily with breakfast.   Yes [provider]  LISINOPRIL PO Take by mouth.  05/09/11  [provider]     Positive ROS: Otherwise negative  All other systems have been reviewed and were otherwise negative with the exception of those mentioned in the HPI and as above.  Physical Exam: Constitutional: Alert, well-appearing, no acute distress.  She has no hoarseness in the office today. Ears: External ears without lesions or tenderness. Ear canals are clear bilaterally with intact, clear TMs.  Nasal: External nose without lesions. Septum midline.  Both middle meatus regions are clear.. Clear nasal passages bilaterally. Oral: Lips and gums without lesions. Tongue and  palate mucosa without lesions. Posterior oropharynx clear.  She has generous size 2+ cryptic tonsils bilaterally.  Few noted tonsilliths but no signs of infection. Fiberoptic laryngoscopy was performed through the right nostril.  The nasopharynx was clear.  The base of tongue vallecula and epiglottis were normal.  Hypopharynx was clear.  Vocal cords  were clear bilaterally with normal vocal cord mobility.  She had moderate edema of the arytenoid mucosa.  But no erythema.  The fiberoptic laryngoscope was passed through the upper esophageal sphincter without difficulty and the upper cervical esophagus was clear. Neck: No palpable adenopathy or masses.  No palpable thyroid nodules. Respiratory: Breathing comfortably  Skin: No facial/neck lesions or rash noted.  Laryngoscopy  Date/Time: 10/14/2019 2:36 PM Performed by: Rozetta Nunnery, MD Authorized by: Rozetta Nunnery, MD   Consent:    Consent obtained:  Verbal   Consent given by:  Patient Procedure details:    Indications: hoarseness, dysphagia, or aspiration     Medication:  Afrin   Instrument: flexible fiberoptic laryngoscope     Scope location: right nare   Sinus:    Right middle meatus: normal     Right nasopharynx: normal   Mouth:    Oropharynx: normal     Vallecula: normal     Base of tongue: normal   Throat:    Pyriform sinus: normal     True vocal cords: normal   Comments:     On fiberoptic laryngoscopy the hypopharynx and larynx was clear with no evidence of neoplasm.  Both vocal cords were clear with normal vocal cord mobility.  She had moderate edema of the arytenoid mucosa consistent with probable laryngeal pharyngeal reflux    Assessment: History of intermittent hoarseness and chronic throat clearing consistent with probable laryngeal pharyngeal reflux. Clear hypopharynx and larynx on fiberoptic laryngoscopy.  Plan: Recommended taking omeprazole 40 mg daily before dinner for the next 6 weeks to see if this  helps with her symptoms. Reassured her of no evidence of infection or neoplasm of the hypopharynx and larynx. If she continues to have symptoms would recommend further evaluation with gastroenterology.   Radene Journey, MD   CC:

## 2019-10-14 NOTE — Telephone Encounter (Signed)
Called and left a VM advising patient to call back to schedule appt.for gel injection with Dr. Durward Fortes.  Approved, Gelsyn-3, bilateral knee. [place] Patient will be responsible for 20% OOP. No Co-pay No PA required

## 2019-11-02 ENCOUNTER — Ambulatory Visit: Payer: Medicare Other | Admitting: Orthopaedic Surgery

## 2019-11-02 ENCOUNTER — Encounter: Payer: Self-pay | Admitting: Orthopaedic Surgery

## 2019-11-02 ENCOUNTER — Ambulatory Visit (INDEPENDENT_AMBULATORY_CARE_PROVIDER_SITE_OTHER): Payer: Medicare Other | Admitting: Orthopaedic Surgery

## 2019-11-02 ENCOUNTER — Other Ambulatory Visit: Payer: Self-pay

## 2019-11-02 VITALS — Ht 64.0 in | Wt 280.0 lb

## 2019-11-02 DIAGNOSIS — M1711 Unilateral primary osteoarthritis, right knee: Secondary | ICD-10-CM

## 2019-11-02 DIAGNOSIS — M1712 Unilateral primary osteoarthritis, left knee: Secondary | ICD-10-CM | POA: Diagnosis not present

## 2019-11-02 DIAGNOSIS — M170 Bilateral primary osteoarthritis of knee: Secondary | ICD-10-CM

## 2019-11-02 MED ORDER — SODIUM HYALURONATE (VISCOSUP) 16.8 MG/2ML IX SOSY
16.8000 mg | PREFILLED_SYRINGE | INTRA_ARTICULAR | Status: AC | PRN
  Administered 2019-11-02: 16.8 mg via INTRA_ARTICULAR

## 2019-11-02 NOTE — Progress Notes (Signed)
Office Visit Note   Patient: Hailey Vance           Date of Birth: Apr 16, 1965           MRN: [id number] Visit Date: 11/02/2019              Requested by: Debbrah Alar, NP [address] [place],  [place] [address] PCP: Debbrah Alar, NP   Assessment & Plan: Visit Diagnoses:  1. Bilateral primary osteoarthritis of knee     Plan: Bilateral GELSYN-3 injections both knees.  Return weekly for the next 2 weeks to complete the series of 3  Follow-Up Instructions: Return in about 1 week (around 11/09/2019).   Orders:  Orders Placed This Encounter  Procedures  . Large Joint Inj: bilateral knee   No orders of the defined types were placed in this encounter.     Procedures: Large Joint Inj: bilateral knee on 11/02/2019 11:30 AM Indications: pain and joint swelling Details: 25 G 1.5 in needle, anteromedial approach  Arthrogram: No  Medications (Right): 16.8 mg Sodium Hyaluronate (Viscosup) 16.8 MG/2ML Medications (Left): 16.8 mg Sodium Hyaluronate (Viscosup) 16.8 MG/2ML Outcome: tolerated well, no immediate complications Procedure, treatment alternatives, risks and benefits explained, specific risks discussed. Consent was given by the patient. Immediately prior to procedure a time out was called to verify the correct patient, procedure, equipment, support staff and site/side marked as required. Patient was prepped and draped in the usual sterile fashion.       Clinical Data: No additional findings.   Subjective: Chief Complaint  Patient presents with  . Left Knee - Pain    Bilateral Gelsyn injections started 11/02/2019  . Right Knee - Pain  Patient presents today for the first set of Gelsyn injections bilaterally.  HPI  Review of Systems   Objective: Vital Signs: Ht 5\' 4"  (1.626 m)   Wt 280 lb (127 kg)   LMP 10/02/2007   BMI 48.06 kg/m   Physical Exam  Ortho Exam large knees.  BMI is 48.  Difficult to ascertain whether there is an  effusion but the knees were not hot warm or red and minimally tender  Specialty Comments:  No specialty comments available.  Imaging: No results found.   PMFS History: Patient Active Problem List   Diagnosis Date Noted  . Major depressive disorder   . Low back pain 06/15/2019  . Osteoarthritis of both hips 06/15/2019  . Bilateral primary osteoarthritis of knee 03/29/2019  . Morbid obesity ([place]) 02/15/2018  . Lipoma of left shoulder 12/02/2016  . BMI 50.0-59.9, adult ([place])   . HTN (hypertension) 09/06/2014  . Osteoarthritis 09/06/2014  . Post-menopausal bleeding 09/06/2014  . OSA (obstructive sleep apnea) 09/06/2014  . Generalized anxiety disorder 09/06/2014  . Vitamin D deficiency 09/06/2014  . Chronic back pain    Past Medical History:  Diagnosis Date  . Bilateral primary osteoarthritis of knee 03/29/2019  . Chronic back pain   . Cystocele with rectocele   . Generalized anxiety disorder   . GERD (gastroesophageal reflux disease)   . Headache    otc med prn  . Heart murmur    echo in epic 11-26-2017  normal w/ ef 55-60%  . History of chest pain    pt referred for atyical chest pain , cardiology evaluation w/ dr berry note in epic 10-25-2014, stated with low risk w/ no ischemia , nuclear ef 70% , done 09-19-2014 in epic,  non-cardiac chest pain  . HTN (hypertension)  followed by pcp    (normal nuclear stress test 09-19-2014 in epic)  . Low blood potassium   . Major depressive disorder   . OSA (obstructive sleep apnea)    04/2015 study in e- Severe OSA (AHI = 55.0/hour);   (10-06-2019  per pt has not used cpap since 2019 after gastric bypass)  . Osteoarthritis of both hips 06/15/2019  . Plantar fasciitis of left foot   . S/P gastric bypass 02/15/2018   sleeve  . SUI (stress urinary incontinence, female)   . Vitamin D deficiency 09/06/2014  . Wears glasses     Family History  Problem Relation Age of Onset  . Lung cancer Mother 35       dec  . Cancer Mother   .  Hypertension Mother   . Arthritis Mother   . Cancer Father 72       prostate  . Hypertension Father   . Leukemia Sister   . Hypertension Sister   . Arthritis Sister   . Hyperlipidemia Sister   . Hypertension Brother   . Arthritis Brother   . Multiple sclerosis Sister   . Hypertension Sister   . Hypertension Sister   . Hypertension Brother   . Hypertension Brother   . Breast cancer Paternal Aunt   . Alzheimer's disease Paternal Aunt   . Diabetes Maternal Grandmother   . Stroke Maternal Grandmother   . Heart attack Paternal Grandmother   . Asthma Other     Past Surgical History:  Procedure Laterality Date  . BLADDER SUSPENSION N/A 10/12/2019   Procedure: TRANSVAGINAL TAPE (TVT) PROCEDURE;  Surgeon: Bobbye Charleston, MD;  Location: [facility];  Service: Gynecology;  Laterality: N/A;  . CESAREAN SECTION W/BTL  1990  . COLONOSCOPY WITH PROPOFOL N/A 08/30/2015   Procedure: COLONOSCOPY WITH PROPOFOL;  Surgeon: Mauri Pole, MD;  Location: [facility];  Service: Endoscopy;  Laterality: N/A;  . CYSTOCELE REPAIR N/A 10/12/2019   Procedure: ANTERIOR (CYSTOCELE);  Surgeon: Bobbye Charleston, MD;  Location: [facility];  Service: Gynecology;  Laterality: N/A;  . CYSTOSCOPY N/A 05/23/2015   Procedure: CYSTOSCOPY;  Surgeon: Bobbye Charleston, MD;  Location: [facility];  Service: Gynecology;  Laterality: N/A;  . CYSTOSCOPY N/A 10/12/2019   Procedure: CYSTOSCOPY;  Surgeon: Bobbye Charleston, MD;  Location: [facility];  Service: Gynecology;  Laterality: N/A;  . HIATAL HERNIA REPAIR N/A 02/15/2018   Procedure: HERNIA REPAIR HIATAL;  Surgeon: Excell Seltzer, MD;  Location: [facility];  Service: General;  Laterality: N/A;  . HYSTEROSCOPY WITH D & C N/A 01/11/2015   Procedure: DILATATION AND CURETTAGE /HYSTEROSCOPY;  Surgeon: Jerelyn Charles, MD;  Location: [facility];  Service: Gynecology;  Laterality: N/A;  . KNEE ARTHROSCOPY Right 2010  . LAPAROSCOPIC  GASTRIC SLEEVE RESECTION N/A 02/15/2018   Procedure: LAPAROSCOPIC GASTRIC SLEEVE RESECTION WITH UPPER ENDO AND ERAS PATHWAY;  Surgeon: Excell Seltzer, MD;  Location: [facility];  Service: General;  Laterality: N/A;  . LAPAROSCOPIC OOPHORECTOMY Bilateral 2004   LEFT OOPHORECTOMY AND PARTIAL RIGHT OOPHORECTOMY  . LIPOMA EXCISION Left 12/02/2016   Procedure: EXCISION LIPOMA OF LEFT SHOULDER;  Surgeon: Garald Balding, MD;  Location: [facility];  Service: Orthopedics;  Laterality: Left;  . ROBOTIC ASSISTED TOTAL HYSTERECTOMY WITH SALPINGECTOMY Bilateral 05/23/2015   Procedure: ROBOTIC ASSISTED TOTAL HYSTERECTOMY WITH SALPINGECTOMYwith right oophorectomy;  Surgeon: Bobbye Charleston, MD;  Location: [facility];  Service: Gynecology;  Laterality: Bilateral;  . UMBILICAL HERNIA REPAIR  2002   Social History  Occupational History  . Occupation: Disability  Tobacco Use  . Smoking status: Never Smoker  . Smokeless tobacco: Never Used  Vaping Use  . Vaping Use: Never used  Substance and Sexual Activity  . Alcohol use: No    Alcohol/week: 0.0 standard drinks  . Drug use: Never  . Sexual activity: Not on file    Comment: BTL w/ C/S in 1990

## 2019-11-10 ENCOUNTER — Encounter: Payer: Self-pay | Admitting: Orthopaedic Surgery

## 2019-11-10 ENCOUNTER — Other Ambulatory Visit: Payer: Self-pay

## 2019-11-10 ENCOUNTER — Ambulatory Visit (INDEPENDENT_AMBULATORY_CARE_PROVIDER_SITE_OTHER): Payer: Medicare Other | Admitting: Orthopaedic Surgery

## 2019-11-10 VITALS — Ht 64.0 in | Wt 280.0 lb

## 2019-11-10 DIAGNOSIS — M170 Bilateral primary osteoarthritis of knee: Secondary | ICD-10-CM

## 2019-11-10 DIAGNOSIS — M1712 Unilateral primary osteoarthritis, left knee: Secondary | ICD-10-CM | POA: Diagnosis not present

## 2019-11-10 DIAGNOSIS — M1711 Unilateral primary osteoarthritis, right knee: Secondary | ICD-10-CM

## 2019-11-10 NOTE — Progress Notes (Signed)
Office Visit Note   Patient: Hailey Vance           Date of Birth: 07-24-1965           MRN: [id number] Visit Date: 11/10/2019              Requested by: Debbrah Alar, NP [address] [place],   [id number] PCP: Debbrah Alar, NP   Assessment & Plan: Visit Diagnoses:  1. Bilateral primary osteoarthritis of knee     Plan:  #1: Bilateral Gelsyn injections were given by Dr.Whitfield atraumatically with spinal needle.  Tolerated procedure well.  Follow-Up Instructions: No follow-ups on file.   Orders:  No orders of the defined types were placed in this encounter.  No orders of the defined types were placed in this encounter.     Procedures: No procedures performed   Clinical Data: No additional findings.   Subjective: Chief Complaint  Patient presents with  . Right Knee - Follow-up    Bilateral Gelsyn started 11/02/2019  . Left Knee - Follow-up  Patient presents today for the second bilateral Gelsyn injections. She started the series on 11/02/2019. Patient states that she is doing well. No complications from the first injections.   HPI  Review of Systems   Objective: Vital Signs: Ht 5\' 4"  (1.626 m)   Wt 280 lb (127 kg)   LMP 10/02/2007   BMI 48.06 kg/m   Physical Exam  Ortho Exam  Specialty Comments:  No specialty comments available.  Imaging: No results found.   PMFS History: Patient Active Problem List   Diagnosis Date Noted  . Major depressive disorder   . Low back pain 06/15/2019  . Osteoarthritis of both hips 06/15/2019  . Bilateral primary osteoarthritis of knee 03/29/2019  . Morbid obesity ([place]) 02/15/2018  . Lipoma of left shoulder 12/02/2016  . BMI 50.0-59.9, adult ([place])   . HTN (hypertension) 09/06/2014  . Osteoarthritis 09/06/2014  . Post-menopausal bleeding 09/06/2014  . OSA (obstructive sleep apnea) 09/06/2014  . Generalized anxiety disorder 09/06/2014  . Vitamin D deficiency 09/06/2014  .  Chronic back pain    Past Medical History:  Diagnosis Date  . Bilateral primary osteoarthritis of knee 03/29/2019  . Chronic back pain   . Cystocele with rectocele   . Generalized anxiety disorder   . GERD (gastroesophageal reflux disease)   . Headache    otc med prn  . Heart murmur    echo in epic 11-26-2017  normal w/ ef 55-60%  . History of chest pain    pt referred for atyical chest pain , cardiology evaluation w/ dr berry note in epic 10-25-2014, stated with low risk w/ no ischemia , nuclear ef 70% , done 09-19-2014 in epic,  non-cardiac chest pain  . HTN (hypertension)    followed by pcp    (normal nuclear stress test 09-19-2014 in epic)  . Low blood potassium   . Major depressive disorder   . OSA (obstructive sleep apnea)    04/2015 study in e- Severe OSA (AHI = 55.0/hour);   (10-06-2019  per pt has not used cpap since 2019 after gastric bypass)  . Osteoarthritis of both hips 06/15/2019  . Plantar fasciitis of left foot   . S/P gastric bypass 02/15/2018   sleeve  . SUI (stress urinary incontinence, female)   . Vitamin D deficiency 09/06/2014  . Wears glasses     Family History  Problem Relation Age of Onset  . Lung cancer  Mother 41       dec  . Cancer Mother   . Hypertension Mother   . Arthritis Mother   . Cancer Father 69       prostate  . Hypertension Father   . Leukemia Sister   . Hypertension Sister   . Arthritis Sister   . Hyperlipidemia Sister   . Hypertension Brother   . Arthritis Brother   . Multiple sclerosis Sister   . Hypertension Sister   . Hypertension Sister   . Hypertension Brother   . Hypertension Brother   . Breast cancer Paternal Aunt   . Alzheimer's disease Paternal Aunt   . Diabetes Maternal Grandmother   . Stroke Maternal Grandmother   . Heart attack Paternal Grandmother   . Asthma Other     Past Surgical History:  Procedure Laterality Date  . BLADDER SUSPENSION N/A 10/12/2019   Procedure: TRANSVAGINAL TAPE (TVT) PROCEDURE;  Surgeon:  Bobbye Charleston, MD;  [facility];  Service: Gynecology;  Laterality: N/A;  . CESAREAN SECTION W/BTL  1990  . COLONOSCOPY WITH PROPOFOL N/A 08/30/2015   Procedure: COLONOSCOPY WITH PROPOFOL;  Surgeon: Mauri Pole, MD;  Location: [facility];  Service: Endoscopy;  Laterality: N/A;  . CYSTOCELE REPAIR N/A 10/12/2019   Procedure: ANTERIOR (CYSTOCELE);  Surgeon: Bobbye Charleston, MD;  Location: [facility];  Service: Gynecology;  Laterality: N/A;  . CYSTOSCOPY N/A 05/23/2015   Procedure: CYSTOSCOPY;  Surgeon: Bobbye Charleston, MD;  Location: [facility];  Service: Gynecology;  Laterality: N/A;  . CYSTOSCOPY N/A 10/12/2019   Procedure: CYSTOSCOPY;  Surgeon: Bobbye Charleston, MD;  Location: [facility];  Service: Gynecology;  Laterality: N/A;  . HIATAL HERNIA REPAIR N/A 02/15/2018   Procedure: HERNIA REPAIR HIATAL;  Surgeon: Excell Seltzer, MD;  Location: [facility];  Service: General;  Laterality: N/A;  . HYSTEROSCOPY WITH D & C N/A 01/11/2015   Procedure: DILATATION AND CURETTAGE /HYSTEROSCOPY;  Surgeon: Jerelyn Charles, MD;  Location: [facility];  Service: Gynecology;  Laterality: N/A;  . KNEE ARTHROSCOPY Right 2010  . LAPAROSCOPIC GASTRIC SLEEVE RESECTION N/A 02/15/2018   Procedure: LAPAROSCOPIC GASTRIC SLEEVE RESECTION WITH UPPER ENDO AND ERAS PATHWAY;  Surgeon: Excell Seltzer, MD;  Location: [facility];  Service: General;  Laterality: N/A;  . LAPAROSCOPIC OOPHORECTOMY Bilateral 2004   LEFT OOPHORECTOMY AND PARTIAL RIGHT OOPHORECTOMY  . LIPOMA EXCISION Left 12/02/2016   Procedure: EXCISION LIPOMA OF LEFT SHOULDER;  Surgeon: Garald Balding, MD;  Location: [facility];  Service: Orthopedics;  Laterality: Left;  . ROBOTIC ASSISTED TOTAL HYSTERECTOMY WITH SALPINGECTOMY Bilateral 05/23/2015   Procedure: ROBOTIC ASSISTED TOTAL HYSTERECTOMY WITH SALPINGECTOMYwith right oophorectomy;  Surgeon: Bobbye Charleston, MD;  Location: [facility];  Service: Gynecology;   Laterality: Bilateral;  . UMBILICAL HERNIA REPAIR  2002   Social History   Occupational History  . Occupation: Disability  Tobacco Use  . Smoking status: Never Smoker  . Smokeless tobacco: Never Used  Vaping Use  . Vaping Use: Never used  Substance and Sexual Activity  . Alcohol use: No    Alcohol/week: 0.0 standard drinks  . Drug use: Never  . Sexual activity: Not on file    Comment: BTL w/ C/S in 1990

## 2019-11-16 ENCOUNTER — Ambulatory Visit (INDEPENDENT_AMBULATORY_CARE_PROVIDER_SITE_OTHER): Payer: Medicare Other | Admitting: Orthopaedic Surgery

## 2019-11-16 ENCOUNTER — Other Ambulatory Visit: Payer: Self-pay

## 2019-11-16 ENCOUNTER — Encounter: Payer: Self-pay | Admitting: Orthopaedic Surgery

## 2019-11-16 VITALS — Ht 64.0 in | Wt 280.0 lb

## 2019-11-16 DIAGNOSIS — M1711 Unilateral primary osteoarthritis, right knee: Secondary | ICD-10-CM

## 2019-11-16 DIAGNOSIS — M1712 Unilateral primary osteoarthritis, left knee: Secondary | ICD-10-CM | POA: Diagnosis not present

## 2019-11-16 DIAGNOSIS — M170 Bilateral primary osteoarthritis of knee: Secondary | ICD-10-CM

## 2019-11-16 MED ORDER — SODIUM HYALURONATE (VISCOSUP) 16.8 MG/2ML IX SOSY
16.8000 mg | PREFILLED_SYRINGE | INTRA_ARTICULAR | Status: AC | PRN
  Administered 2019-11-16: 16.8 mg via INTRA_ARTICULAR

## 2019-11-16 NOTE — Progress Notes (Signed)
Office Visit Note   Patient: Hailey Vance           Date of Birth: 04-13-1965           MRN: [id number] Visit Date: 11/16/2019              Requested by: Debbrah Alar, NP [address] [place],  [place] [id number] PCP: Debbrah Alar, NP   Assessment & Plan: Visit Diagnoses:  1. Bilateral primary osteoarthritis of knee     Plan: Third and final [place] injections both knees.  Mrs. Carmen thinks it is making a difference with less stiffness and soreness  Follow-Up Instructions: Return if symptoms worsen or fail to improve.   Orders:  Orders Placed This Encounter  Procedures  . Large Joint Inj: bilateral knee   No orders of the defined types were placed in this encounter.     Procedures: Large Joint Inj: bilateral knee on 11/16/2019 10:48 AM Indications: pain and joint swelling Details: 25 G 1.5 in needle, anteromedial approach  Arthrogram: No  Medications (Right): 16.8 mg Sodium Hyaluronate (Viscosup) 16.8 MG/2ML Medications (Left): 16.8 mg Sodium Hyaluronate (Viscosup) 16.8 MG/2ML Outcome: tolerated well, no immediate complications Procedure, treatment alternatives, risks and benefits explained, specific risks discussed. Consent was given by the patient. Immediately prior to procedure a time out was called to verify the correct patient, procedure, equipment, support staff and site/side marked as required. Patient was prepped and draped in the usual sterile fashion.       Clinical Data: No additional findings.   Subjective: Chief Complaint  Patient presents with  . Right Knee - Follow-up    Bilateral gelsyn started 11/02/2019  . Left Knee - Follow-up  Patient presents today for the third and final set of Gelsyn bilaterally. She started the series on 11/02/2019, and wishes to proceed on with the final injections today.  Mrs. Edell feels like it is made a difference in terms of her stiffness and soreness.  Still having trouble with  ambulation.  Has history of obesity with BMI presently at 66.  She has had a a prior gastric sleeve procedure  HPI  Review of Systems   Objective: Vital Signs: Ht 5\' 4"  (1.626 m)   Wt 280 lb (127 kg)   LMP 10/02/2007   BMI 48.06 kg/m   Physical Exam  Ortho Exam large knees.  Full extension about 90 degrees of flexion at which point calf touches thigh.  No instability.  Some patella crepitation.  Very minimal medial lateral joint pain.  Not sure if there is any effusion based on the size of her knees  Specialty Comments:  No specialty comments available.  Imaging: No results found.   PMFS History: Patient Active Problem List   Diagnosis Date Noted  . Major depressive disorder   . Low back pain 06/15/2019  . Osteoarthritis of both hips 06/15/2019  . Bilateral primary osteoarthritis of knee 03/29/2019  . Morbid obesity ([place]) 02/15/2018  . Lipoma of left shoulder 12/02/2016  . BMI 50.0-59.9, adult ([place])   . HTN (hypertension) 09/06/2014  . Osteoarthritis 09/06/2014  . Post-menopausal bleeding 09/06/2014  . OSA (obstructive sleep apnea) 09/06/2014  . Generalized anxiety disorder 09/06/2014  . Vitamin D deficiency 09/06/2014  . Chronic back pain    Past Medical History:  Diagnosis Date  . Bilateral primary osteoarthritis of knee 03/29/2019  . Chronic back pain   . Cystocele with rectocele   . Generalized anxiety disorder   .  GERD (gastroesophageal reflux disease)   . Headache    otc med prn  . Heart murmur    echo in epic 11-26-2017  normal w/ ef 55-60%  . History of chest pain    pt referred for atyical chest pain , cardiology evaluation w/ dr berry note in epic 10-25-2014, stated with low risk w/ no ischemia , nuclear ef 70% , done 09-19-2014 in epic,  non-cardiac chest pain  . HTN (hypertension)    followed by pcp    (normal nuclear stress test 09-19-2014 in epic)  . Low blood potassium   . Major depressive disorder   . OSA (obstructive sleep apnea)     04/2015 study in e- Severe OSA (AHI = 55.0/hour);   (10-06-2019  per pt has not used cpap since 2019 after gastric bypass)  . Osteoarthritis of both hips 06/15/2019  . Plantar fasciitis of left foot   . S/P gastric bypass 02/15/2018   sleeve  . SUI (stress urinary incontinence, female)   . Vitamin D deficiency 09/06/2014  . Wears glasses     Family History  Problem Relation Age of Onset  . Lung cancer Mother 45       dec  . Cancer Mother   . Hypertension Mother   . Arthritis Mother   . Cancer Father 63       prostate  . Hypertension Father   . Leukemia Sister   . Hypertension Sister   . Arthritis Sister   . Hyperlipidemia Sister   . Hypertension Brother   . Arthritis Brother   . Multiple sclerosis Sister   . Hypertension Sister   . Hypertension Sister   . Hypertension Brother   . Hypertension Brother   . Breast cancer Paternal Aunt   . Alzheimer's disease Paternal Aunt   . Diabetes Maternal Grandmother   . Stroke Maternal Grandmother   . Heart attack Paternal Grandmother   . Asthma Other     Past Surgical History:  Procedure Laterality Date  . BLADDER SUSPENSION N/A 10/12/2019   Procedure: TRANSVAGINAL TAPE (TVT) PROCEDURE;  Surgeon: Bobbye Charleston, MD;  Location: [facility];  Service: Gynecology;  Laterality: N/A;  . CESAREAN SECTION W/BTL  1990  . COLONOSCOPY WITH PROPOFOL N/A 08/30/2015   Procedure: COLONOSCOPY WITH PROPOFOL;  Surgeon: Mauri Pole, MD;  Location: [facility];  Service: Endoscopy;  Laterality: N/A;  . CYSTOCELE REPAIR N/A 10/12/2019   Procedure: ANTERIOR (CYSTOCELE);  Surgeon: Bobbye Charleston, MD;  Location: [facility];  Service: Gynecology;  Laterality: N/A;  . CYSTOSCOPY N/A 05/23/2015   Procedure: CYSTOSCOPY;  Surgeon: Bobbye Charleston, MD;  Location: [facility];  Service: Gynecology;  Laterality: N/A;  . CYSTOSCOPY N/A 10/12/2019   Procedure: CYSTOSCOPY;  Surgeon: Bobbye Charleston, MD;  Location: [facility];  Service: Gynecology;  Laterality: N/A;  . HIATAL HERNIA REPAIR N/A 02/15/2018   Procedure: HERNIA REPAIR HIATAL;  Surgeon: Excell Seltzer, MD;  Location: [facility];  Service: General;  Laterality: N/A;  . HYSTEROSCOPY WITH D & C N/A 01/11/2015   Procedure: DILATATION AND CURETTAGE /HYSTEROSCOPY;  Surgeon: Jerelyn Charles, MD;  Location: [facility];  Service: Gynecology;  Laterality: N/A;  . KNEE ARTHROSCOPY Right 2010  . LAPAROSCOPIC GASTRIC SLEEVE RESECTION N/A 02/15/2018   Procedure: LAPAROSCOPIC GASTRIC SLEEVE RESECTION WITH UPPER ENDO AND ERAS PATHWAY;  Surgeon: Excell Seltzer, MD;  Location: [facility];  Service: General;  Laterality: N/A;  . LAPAROSCOPIC OOPHORECTOMY Bilateral 2004   LEFT OOPHORECTOMY  AND PARTIAL RIGHT OOPHORECTOMY  . LIPOMA EXCISION Left 12/02/2016   Procedure: EXCISION LIPOMA OF LEFT SHOULDER;  Surgeon: Garald Balding, MD;  Location: [facility];  Service: Orthopedics;  Laterality: Left;  . ROBOTIC ASSISTED TOTAL HYSTERECTOMY WITH SALPINGECTOMY Bilateral 05/23/2015   Procedure: ROBOTIC ASSISTED TOTAL HYSTERECTOMY WITH SALPINGECTOMYwith right oophorectomy;  Surgeon: Bobbye Charleston, MD;  Location: [facility];  Service: Gynecology;  Laterality: Bilateral;  . UMBILICAL HERNIA REPAIR  2002   Social History   Occupational History  . Occupation: Disability  Tobacco Use  . Smoking status: Never Smoker  . Smokeless tobacco: Never Used  Vaping Use  . Vaping Use: Never used  Substance and Sexual Activity  . Alcohol use: No    Alcohol/week: 0.0 standard drinks  . Drug use: Never  . Sexual activity: Not on file    Comment: BTL w/ C/S in 1990

## 2019-11-18 ENCOUNTER — Other Ambulatory Visit: Payer: Self-pay | Admitting: Obstetrics and Gynecology

## 2019-11-18 DIAGNOSIS — N63 Unspecified lump in unspecified breast: Secondary | ICD-10-CM

## 2019-11-28 ENCOUNTER — Other Ambulatory Visit: Payer: Self-pay

## 2019-11-28 ENCOUNTER — Encounter: Payer: Self-pay | Admitting: Pulmonary Disease

## 2019-11-28 ENCOUNTER — Ambulatory Visit (INDEPENDENT_AMBULATORY_CARE_PROVIDER_SITE_OTHER): Payer: Medicare Other | Admitting: Pulmonary Disease

## 2019-11-28 VITALS — BP 130/86 | HR 99 | Temp 97.7°F | Ht 64.0 in | Wt 288.0 lb

## 2019-11-28 DIAGNOSIS — G4733 Obstructive sleep apnea (adult) (pediatric): Secondary | ICD-10-CM | POA: Diagnosis not present

## 2019-11-28 DIAGNOSIS — E669 Obesity, unspecified: Secondary | ICD-10-CM | POA: Diagnosis not present

## 2019-11-28 DIAGNOSIS — G4730 Sleep apnea, unspecified: Secondary | ICD-10-CM | POA: Diagnosis not present

## 2019-11-28 DIAGNOSIS — J351 Hypertrophy of tonsils: Secondary | ICD-10-CM | POA: Diagnosis not present

## 2019-11-28 NOTE — Progress Notes (Signed)
[facility]  Chief Complaint  Patient presents with  . Consult    pt is here has  witnesnessed apnea episodes    Constitutional:  BP 130/86 (BP Location: Left Arm, Cuff Size: Normal)   Pulse 99   Temp 97.7 F (36.5 C) (Oral)   Ht 5\' 4"  (1.626 m)   Wt 288 lb (130.6 kg)   LMP 10/02/2007   SpO2 97%   BMI 49.44 kg/m   Past Medical History:  Anxiety, OA, Chronic back pain, Depression, Fibroids, HA, HTN, Plantar fasciitis, Vit D deficiency  Past Surgical History:  Her  has a past surgical history that includes Hysteroscopy with D & C (N/A, 01/11/2015); Robotic assisted total hysterectomy with salpingectomy (Bilateral, 05/23/2015); Cystoscopy (N/A, 05/23/2015); Colonoscopy with propofol (N/A, 08/30/2015); Lipoma excision (Left, 12/02/2016); Laparoscopic gastric sleeve resection (N/A, 02/15/2018); Hiatal hernia repair (N/A, 02/15/2018); Cesarean section w/btl (1990); Umbilical hernia repair (2002); Laparoscopic oophorectomy (Bilateral, 2004); Knee arthroscopy (Right, 2010); Bladder suspension (N/A, 10/12/2019); Cystocele repair (N/A, 10/12/2019); and Cystoscopy (N/A, 10/12/2019).  Brief Summary:  Hailey Vance is a 54 y.o. female with obstructive sleep apnea.      Subjective:  I saw her in October 2019.  She had sleep study in 2017 that showed severe sleep apnea.  She wasn't able to afford CPAP set up then.  She continues to have trouble with her sleep.  She snores and wakes up feeling like she can't breath.  She is tired during the day.  Her Epworth score is 20 out of 24.  She was seen by ENT recently, but told that her tonsils were enough of an issue to require surgery.  Physical Exam:   Appearance - well kempt   ENMT - no sinus tenderness, no oral exudate, no LAN, Mallampati 2 airway, no stridor, 3+ tonsils  Respiratory - equal breath sounds bilaterally, no wheezing or rales  CV - s1s2 regular rate and rhythm, no murmurs  Ext - no  clubbing, no edema  Skin - no rashes  Psych - normal mood and affect   Sleep Tests:   PSG 04/09/15 >> AHI 55, SpO2 low 81%  Cardiac Tests:   Echo 11/26/17 >> EF 55 to 60%, PAS 37 mmHg  Social History:  She  reports that she has never smoked. She has never used smokeless tobacco. She reports that she does not drink alcohol and does not use drugs.  Family History:  Her family history includes Alzheimer's disease in her paternal aunt; Arthritis in her brother, mother, and sister; Asthma in an other family member; Breast cancer in her paternal aunt; Cancer in her mother; Cancer (age of onset: 44) in her father; Diabetes in her maternal grandmother; Heart attack in her paternal grandmother; Hyperlipidemia in her sister; Hypertension in her brother, brother, brother, father, mother, sister, sister, and sister; Leukemia in her sister; Lung cancer (age of onset: 19) in her mother; Multiple sclerosis in her sister; Stroke in her maternal grandmother.      Assessment/Plan:   Obstructive sleep apnea. - will arrange for repeat home sleep study to assess her current status; explained her insurance might require in lab study - will then plan to arrange for auto CPAP set up  Obesity. - discussed importance of weight loss  Tonsillar hypertrophy. - might need follow up with ENT if she has trouble tolerating CPAP  Time Spent Involved in Patient Care on Day of Examination:  21 minutes  Follow up:  Patient Instructions  Will arrange for home sleep study Will call to arrange for follow up after sleep study reviewed    Medication List:   Allergies as of 11/28/2019      Reactions   Penicillins Itching, Other (See Comments)   Has patient had a PCN reaction causing immediate rash, facial/tongue/throat swelling, SOB or lightheadedness with hypotension: No Has patient had a PCN reaction causing severe rash involving mucus membranes or skin necrosis: No Has patient had a PCN reaction that  required hospitalization No Has patient had a PCN reaction occurring within the last 10 years: No If all of the above answers are "NO", then may proceed with Cephalosporin use.      Medication List       Accurate as of November 28, 2019 10:41 AM. If you have any questions, ask your nurse or doctor.        STOP taking these medications   cyclobenzaprine 5 MG tablet Commonly known as: FLEXERIL Stopped by: Chesley Mires, MD   oxyCODONE-acetaminophen 5-325 MG tablet Commonly known as: PERCOCET/ROXICET Stopped by: Chesley Mires, MD     TAKE these medications   amLODipine 2.5 MG tablet Commonly known as: NORVASC Take 3 tablets (7.5 mg total) by mouth daily. Please call patient as soon as rx is ready   B-12 500 MCG Tabs Take 1 tablet by mouth daily.   Biofreeze 4 % Gel Generic drug: Menthol (Topical Analgesic) Apply 1 application topically daily.   buPROPion 300 MG 24 hr tablet Commonly known as: WELLBUTRIN XL Take 1 tablet by mouth once daily   CALTRATE GUMMY BITES PO Take by mouth daily.   DRY EYES OP Apply to eye as needed.   lisinopril-hydrochlorothiazide 20-25 MG tablet Commonly known as: ZESTORETIC Take 1 tablet by mouth once daily What changed:   how much to take  how to take this  when to take this   meloxicam 7.5 MG tablet Commonly known as: MOBIC TAKE 1 TABLET BY MOUTH ONCE DAILY AS NEEDED FOR PAIN What changed: See the new instructions.   multivitamin-iron-minerals-folic acid chewable tablet Chew 1 tablet by mouth daily.   omeprazole 40 MG capsule Commonly known as: PRILOSEC Take 1 capsule (40 mg total) by mouth daily.   venlafaxine XR 150 MG 24 hr capsule Commonly known as: EFFEXOR-XR Take 150 mg by mouth daily with breakfast.   Vitamin D3 50 MCG (2000 UT) Chew Chew 8,000 Units by mouth daily.       Signature:  Chesley Mires, MD [place] Pager - [phone] 11/28/2019, 10:41 AM

## 2019-11-28 NOTE — Patient Instructions (Signed)
Will arrange for home sleep study Will call to arrange for follow up after sleep study reviewed  

## 2019-12-06 ENCOUNTER — Ambulatory Visit
Admission: RE | Admit: 2019-12-06 | Discharge: 2019-12-06 | Disposition: A | Discharge status: Home / Self Care | Payer: Medicare Other | Source: Ambulatory Visit | Attending: Obstetrics and Gynecology | Admitting: Obstetrics and Gynecology

## 2019-12-06 ENCOUNTER — Other Ambulatory Visit: Payer: Self-pay

## 2019-12-06 DIAGNOSIS — R928 Other abnormal and inconclusive findings on diagnostic imaging of breast: Secondary | ICD-10-CM | POA: Diagnosis not present

## 2019-12-06 DIAGNOSIS — N6320 Unspecified lump in the left breast, unspecified quadrant: Secondary | ICD-10-CM | POA: Diagnosis not present

## 2019-12-06 DIAGNOSIS — N63 Unspecified lump in unspecified breast: Secondary | ICD-10-CM

## 2020-01-05 ENCOUNTER — Other Ambulatory Visit: Payer: Self-pay

## 2020-01-05 ENCOUNTER — Ambulatory Visit: Payer: Medicare Other

## 2020-01-05 DIAGNOSIS — G4733 Obstructive sleep apnea (adult) (pediatric): Secondary | ICD-10-CM | POA: Diagnosis not present

## 2020-01-06 ENCOUNTER — Telehealth: Payer: Self-pay | Admitting: Pulmonary Disease

## 2020-01-06 DIAGNOSIS — G4733 Obstructive sleep apnea (adult) (pediatric): Secondary | ICD-10-CM | POA: Diagnosis not present

## 2020-01-06 NOTE — Telephone Encounter (Signed)
Attempted to call patient to go over results and was unable to leave a message due to no voicemail available. Will attempt to call again at another time.

## 2020-01-06 NOTE — Telephone Encounter (Signed)
HST 01/05/20 >> AHI 34, SpO2 low 83%   Please let her know that home sleep study shows severe obstructive sleep apnea.  Please send order to have her get set up with auto CPAP range 5 to 20 cm H2O with heated humidity and mask of choice.  She needs ROV with me or NP in 2 months after CPAP set up.

## 2020-01-07 DIAGNOSIS — Z20822 Contact with and (suspected) exposure to covid-19: Secondary | ICD-10-CM | POA: Diagnosis not present

## 2020-01-11 DIAGNOSIS — M170 Bilateral primary osteoarthritis of knee: Secondary | ICD-10-CM | POA: Diagnosis not present

## 2020-01-11 DIAGNOSIS — M25561 Pain in right knee: Secondary | ICD-10-CM | POA: Diagnosis not present

## 2020-01-11 DIAGNOSIS — M25562 Pain in left knee: Secondary | ICD-10-CM | POA: Diagnosis not present

## 2020-01-11 DIAGNOSIS — M1711 Unilateral primary osteoarthritis, right knee: Secondary | ICD-10-CM | POA: Diagnosis not present

## 2020-01-12 NOTE — Telephone Encounter (Signed)
Attempted for a 2nd time to call patient and was unable to leave a message on voicemail due to mailbox being full. Letter being sent to home address on chart.

## 2020-01-12 NOTE — Telephone Encounter (Signed)
Noted  

## 2020-01-17 ENCOUNTER — Ambulatory Visit: Payer: Medicare Other | Admitting: Family

## 2020-01-21 ENCOUNTER — Other Ambulatory Visit: Payer: Self-pay | Admitting: Family

## 2020-01-24 ENCOUNTER — Telehealth: Payer: Self-pay | Admitting: Pulmonary Disease

## 2020-01-24 NOTE — Telephone Encounter (Signed)
ATC pt, vm box is full. WCB.    Per Dr. Halford Chessman: HST 01/05/20 >> AHI 34, SpO2 low 83%   Please let her know that home sleep study shows severe obstructive sleep apnea.  Please send order to have her get set up with auto CPAP range 5 to 20 cm H2O with heated humidity and mask of choice.  She needs ROV with me or NP in 2 months after CPAP set up.

## 2020-01-28 ENCOUNTER — Encounter: Payer: Self-pay | Admitting: Family

## 2020-02-02 DIAGNOSIS — Z20822 Contact with and (suspected) exposure to covid-19: Secondary | ICD-10-CM | POA: Diagnosis not present

## 2020-02-02 NOTE — Telephone Encounter (Signed)
ATC pt. VM box is full. WCB.  

## 2020-02-07 ENCOUNTER — Encounter: Payer: Self-pay | Admitting: Emergency Medicine

## 2020-02-07 NOTE — Telephone Encounter (Signed)
atc pt again, no answer and vm full.  Sent letter previously typed by Daneil Dan.  Will close encounter.

## 2020-02-07 NOTE — Telephone Encounter (Signed)
ATC pt. VM box is full.   Will send letter and close encounter due to several unsuccessful attempts to reach pt.    Triage, please print and mail the letter to pt (letter already generated). Can close encounter once complete. Thanks.

## 2020-02-07 NOTE — Telephone Encounter (Signed)
Patient is returning phone call. Patient phone number is [phone].

## 2020-02-21 ENCOUNTER — Other Ambulatory Visit: Payer: Self-pay

## 2020-02-21 ENCOUNTER — Ambulatory Visit: Payer: Medicare Other | Admitting: Family

## 2020-02-21 ENCOUNTER — Ambulatory Visit (INDEPENDENT_AMBULATORY_CARE_PROVIDER_SITE_OTHER): Payer: Medicare Other | Admitting: Orthopaedic Surgery

## 2020-02-21 ENCOUNTER — Encounter: Payer: Self-pay | Admitting: Orthopaedic Surgery

## 2020-02-21 VITALS — Ht 64.0 in | Wt 279.0 lb

## 2020-02-21 DIAGNOSIS — G8929 Other chronic pain: Secondary | ICD-10-CM

## 2020-02-21 DIAGNOSIS — M170 Bilateral primary osteoarthritis of knee: Secondary | ICD-10-CM | POA: Diagnosis not present

## 2020-02-21 DIAGNOSIS — M5442 Lumbago with sciatica, left side: Secondary | ICD-10-CM

## 2020-02-21 DIAGNOSIS — M5441 Lumbago with sciatica, right side: Secondary | ICD-10-CM | POA: Diagnosis not present

## 2020-02-21 DIAGNOSIS — M160 Bilateral primary osteoarthritis of hip: Secondary | ICD-10-CM

## 2020-02-21 NOTE — Progress Notes (Signed)
Office Visit Note   Patient: Hailey Vance           Date of Birth: Apr 15, 1965           MRN: [id number] Visit Date: 02/21/2020              Requested by: Hailey Alar, NP [address] [place],  [place] [address] PCP: Hailey Alar, NP   Assessment & Plan: Visit Diagnoses:  1. Chronic bilateral low back pain with bilateral sciatica   2. Primary osteoarthritis of both hips   3. Bilateral primary osteoarthritis of knee     Plan: [name] has primary osteoarthritis of both knees and both hips as well as chronic low back pain.  Dr. Ernestina Patches injected her right hip in April with excellent relief for short period of time.  I have been following her for the end-stage osteoarthritis of both knees.  She has had cortisone and viscosupplementation without much relief.  She does use a walker.  She was recently seen by Dr. Roselie Skinner at [facility] who confirmed the osteoarthritis of both knees and suggested she needed to lose weight to have a BMI below 40 before that he would even consider knee replacement.  She has had a prior gastric sleeve with some weight loss.  Long discussion today regarding each of the issues with her joint.  She needs to follow-up with Dr. Saintclair Halsted in neurosurgery as he has all of her records and prior treatment modalities.  We can make the referral.  We will have Dr. Ernestina Patches repeat the right hip injection.  Should continue working on her weight. I injected both of her knees with cortisone in September and she had them reinjected last month in [place].  I think it is too soon to inject.  I have also suggested she check with her primary care physician regarding medicines to help her with all of her pain Follow-Up Instructions: Return if symptoms worsen or fail to improve.   Orders:  No orders of the defined types were placed in this encounter.  No orders of the defined types were placed in this encounter.     Procedures: No procedures  performed   Clinical Data: No additional findings.   Subjective: Chief Complaint  Patient presents with  . Right Hip - Pain  . Left Knee - Pain  . Right Knee - Pain  Patient presents today for bilateral knee pain and right hip pain. She saw Dr.[place] in April of this year and had her right hip injected with cortisone. She said that she would like to get another injection given by him.  She also is having bilateral recurrent knee pain. Her left is worse than the right. She had both knees injected with Dr.Sierah Lacewell on 11/16/2019. She said that the pain is constant. Most of her pain is anterior radiates into her shin, along with her lateral lower leg. She is using a walker to help with ambulation. She is taking Advil and Tylenol for pain relief.  Has seen Dr. Saintclair Halsted in the past for chronic problems with her back that does create some discomfort in both of her hips and both of her legs.  Its been several years.  She recently saw Dr. Roselie Skinner at [facility] for the the arthritis in both of her knees.  He agreed with the end-stage osteoarthritis and need for weight loss before considering knee replacement  HPI  Review of Systems   Objective: Vital Signs: Ht 5'  4" (1.626 m)   Wt 279 lb (126.6 kg)   LMP 10/02/2007   BMI 47.89 kg/m   Physical Exam Constitutional:      Appearance: She is well-developed and well-nourished.  HENT:     Mouth/Throat:     Mouth: Oropharynx is clear and moist.  Eyes:     Extraocular Movements: EOM normal.     Pupils: Pupils are equal, round, and reactive to light.  Pulmonary:     Effort: Pulmonary effort is normal.  Skin:    General: Skin is warm and dry.  Neurological:     Mental Status: She is alert and oriented to person, place, and time.  Psychiatric:        Mood and Affect: Mood and affect normal.        Behavior: Behavior normal.     Ortho Exam awake alert and oriented x3.  Comfortable sitting.  Uses a walker for ambulation.  Straight leg raise  negative.  There was some percussible tenderness of the lower lumbar spine but quite mild.  Does have some limitation of motion of both hips with internal and external rotation.  Has increased varus with weightbearing of both knees with probable predominately medial joint pain consistent with her prior evaluations  Specialty Comments:  No specialty comments available.  Imaging: No results found.   PMFS History: Patient Active Problem List   Diagnosis Date Noted  . Major depressive disorder   . Low back pain 06/15/2019  . Osteoarthritis of both hips 06/15/2019  . Morbid obesity ([place]) 02/15/2018  . Lipoma of left shoulder 12/02/2016  . BMI 50.0-59.9, adult ([place])   . HTN (hypertension) 09/06/2014  . Osteoarthritis 09/06/2014  . Post-menopausal bleeding 09/06/2014  . OSA (obstructive sleep apnea) 09/06/2014  . Generalized anxiety disorder 09/06/2014  . Vitamin D deficiency 09/06/2014  . Chronic back pain    Past Medical History:  Diagnosis Date  . Bilateral primary osteoarthritis of knee 03/29/2019  . Chronic back pain   . Cystocele with rectocele   . Generalized anxiety disorder   . GERD (gastroesophageal reflux disease)   . Headache    otc med prn  . Heart murmur    echo in epic 11-26-2017  normal w/ ef 55-60%  . History of chest pain    pt referred for atyical chest pain , cardiology evaluation w/ dr berry note in epic 10-25-2014, stated with low risk w/ no ischemia , nuclear ef 70% , done 09-19-2014 in epic,  non-cardiac chest pain  . HTN (hypertension)    followed by pcp    (normal nuclear stress test 09-19-2014 in epic)  . Low blood potassium   . Major depressive disorder   . OSA (obstructive sleep apnea)    04/2015 study in e- Severe OSA (AHI = 55.0/hour);   (10-06-2019  per pt has not used cpap since 2019 after gastric bypass)  . Osteoarthritis of both hips 06/15/2019  . Plantar fasciitis of left foot   . S/P gastric bypass 02/15/2018   sleeve  . SUI (stress urinary  incontinence, female)   . Vitamin D deficiency 09/06/2014  . Wears glasses     Family History  Problem Relation Age of Onset  . Lung cancer Mother 22       dec  . Cancer Mother   . Hypertension Mother   . Arthritis Mother   . Cancer Father 61       prostate  . Hypertension Father   . Leukemia Sister   .  Hypertension Sister   . Arthritis Sister   . Hyperlipidemia Sister   . Hypertension Brother   . Arthritis Brother   . Multiple sclerosis Sister   . Hypertension Sister   . Hypertension Sister   . Hypertension Brother   . Hypertension Brother   . Breast cancer Paternal Aunt   . Alzheimer's disease Paternal Aunt   . Diabetes Maternal Grandmother   . Stroke Maternal Grandmother   . Heart attack Paternal Grandmother   . Asthma Other     Past Surgical History:  Procedure Laterality Date  . BLADDER SUSPENSION N/A 10/12/2019   Procedure: TRANSVAGINAL TAPE (TVT) PROCEDURE;  Surgeon: Bobbye Charleston, MD;  Location: [facility];  Service: Gynecology;  Laterality: N/A;  . CESAREAN SECTION W/BTL  1990  . COLONOSCOPY WITH PROPOFOL N/A 08/30/2015   Procedure: COLONOSCOPY WITH PROPOFOL;  Surgeon: Mauri Pole, MD;  Location: [facility];  Service: Endoscopy;  Laterality: N/A;  . CYSTOCELE REPAIR N/A 10/12/2019   Procedure: ANTERIOR (CYSTOCELE);  Surgeon: Bobbye Charleston, MD;  Location: [facility];  Service: Gynecology;  Laterality: N/A;  . CYSTOSCOPY N/A 05/23/2015   Procedure: CYSTOSCOPY;  Surgeon: Bobbye Charleston, MD;  Location: [facility];  Service: Gynecology;  Laterality: N/A;  . CYSTOSCOPY N/A 10/12/2019   Procedure: CYSTOSCOPY;  Surgeon: Bobbye Charleston, MD;  Location: [facility];  Service: Gynecology;  Laterality: N/A;  . HIATAL HERNIA REPAIR N/A 02/15/2018   Procedure: HERNIA REPAIR HIATAL;  Surgeon: Excell Seltzer, MD;  Location: [facility];  Service: General;  Laterality: N/A;  . HYSTEROSCOPY WITH D & C N/A 01/11/2015    Procedure: DILATATION AND CURETTAGE /HYSTEROSCOPY;  Surgeon: Jerelyn Charles, MD;  Location: [facility];  Service: Gynecology;  Laterality: N/A;  . KNEE ARTHROSCOPY Right 2010  . LAPAROSCOPIC GASTRIC SLEEVE RESECTION N/A 02/15/2018   Procedure: LAPAROSCOPIC GASTRIC SLEEVE RESECTION WITH UPPER ENDO AND ERAS PATHWAY;  Surgeon: Excell Seltzer, MD;  Location: [facility];  Service: General;  Laterality: N/A;  . LAPAROSCOPIC OOPHORECTOMY Bilateral 2004   LEFT OOPHORECTOMY AND PARTIAL RIGHT OOPHORECTOMY  . LIPOMA EXCISION Left 12/02/2016   Procedure: EXCISION LIPOMA OF LEFT SHOULDER;  Surgeon: Garald Balding, MD;  Location: [facility];  Service: Orthopedics;  Laterality: Left;  . ROBOTIC ASSISTED TOTAL HYSTERECTOMY WITH SALPINGECTOMY Bilateral 05/23/2015   Procedure: ROBOTIC ASSISTED TOTAL HYSTERECTOMY WITH SALPINGECTOMYwith right oophorectomy;  Surgeon: Bobbye Charleston, MD;  Location: [facility];  Service: Gynecology;  Laterality: Bilateral;  . UMBILICAL HERNIA REPAIR  2002   Social History   Occupational History  . Occupation: Disability  Tobacco Use  . Smoking status: Never Smoker  . Smokeless tobacco: Never Used  Vaping Use  . Vaping Use: Never used  Substance and Sexual Activity  . Alcohol use: No    Alcohol/week: 0.0 standard drinks  . Drug use: Never  . Sexual activity: Not on file    Comment: BTL w/ C/S in 1990

## 2020-03-06 ENCOUNTER — Telehealth: Payer: Self-pay | Admitting: Physical Medicine and Rehabilitation

## 2020-03-06 DIAGNOSIS — G4733 Obstructive sleep apnea (adult) (pediatric): Secondary | ICD-10-CM

## 2020-03-06 NOTE — Telephone Encounter (Signed)
Patient returning call from [facility] for a referral set from Dr. Cleophas Dunker for appt with Dr. Alvester Morin. Please call patient at [phone].

## 2020-03-07 NOTE — Telephone Encounter (Signed)
See referral

## 2020-03-10 ENCOUNTER — Other Ambulatory Visit: Payer: Self-pay | Admitting: Family

## 2020-03-20 ENCOUNTER — Other Ambulatory Visit: Payer: Self-pay

## 2020-03-20 ENCOUNTER — Telehealth: Payer: Self-pay | Admitting: Family

## 2020-03-20 ENCOUNTER — Telehealth (INDEPENDENT_AMBULATORY_CARE_PROVIDER_SITE_OTHER): Payer: Medicare Other | Admitting: Family

## 2020-03-20 DIAGNOSIS — I10 Essential (primary) hypertension: Secondary | ICD-10-CM | POA: Diagnosis not present

## 2020-03-20 DIAGNOSIS — F331 Major depressive disorder, recurrent, moderate: Secondary | ICD-10-CM | POA: Diagnosis not present

## 2020-03-20 DIAGNOSIS — G4733 Obstructive sleep apnea (adult) (pediatric): Secondary | ICD-10-CM

## 2020-03-20 DIAGNOSIS — J351 Hypertrophy of tonsils: Secondary | ICD-10-CM

## 2020-03-20 DIAGNOSIS — M1990 Unspecified osteoarthritis, unspecified site: Secondary | ICD-10-CM

## 2020-03-20 MED ORDER — AMLODIPINE BESYLATE 2.5 MG PO TABS: 7.5000 mg | ORAL_TABLET | Freq: Every day | ORAL | 1 refills | Status: DC

## 2020-03-20 MED ORDER — CELECOXIB 100 MG PO CAPS: 100.0000 mg | ORAL_CAPSULE | Freq: Two times a day (BID) | ORAL | 1 refills | Status: DC | PRN

## 2020-03-20 MED ORDER — BUPROPION HCL ER (XL) 300 MG PO TB24: 300.0000 mg | ORAL_TABLET | Freq: Every day | ORAL | 1 refills | Status: DC

## 2020-03-20 MED ORDER — LISINOPRIL-HYDROCHLOROTHIAZIDE 20-25 MG PO TABS: ORAL_TABLET | ORAL | 1 refills | Status: DC

## 2020-03-20 MED ORDER — VENLAFAXINE HCL ER 150 MG PO CP24: 150.0000 mg | ORAL_CAPSULE | Freq: Every day | ORAL | 1 refills | Status: DC

## 2020-03-20 NOTE — Telephone Encounter (Signed)
HST 01/05/20 >> AHI 34, SpO2 low 83%   Please let her know that home sleep study shows severe obstructive sleep apnea.  Please send order to have her get set up with auto CPAP range 5 to 20 cm H2O with heated humidity and mask of choice.  She needs ROV with me or NP in 2 months after CPAP set up. 

## 2020-03-20 NOTE — Progress Notes (Signed)
Virtual Visit via Video Note  I connected with Mitali Shenefield on 03/20/20 at  2:20 PM EST by a video enabled telemedicine application and verified that I am speaking with the correct person using two identifiers.  Location: Patient: parked car Provider: home   I discussed the limitations of evaluation and management by telemedicine and the availability of in person appointments. The patient expressed understanding and agreed to proceed. Only the patient and myself were present for today's video call.   History of Present Illness:  Patient is a 55 yr old female who presents today for follow up.    Depression- Reports that overall her mood is better. Reports that she is getting out more and staying more active.   She did about 5 sessions with  Therapist. She did not feel like it was a good fit so she stopped going. She lives with her daughter in [place] but keeps her mail and dr's visits in [place]. She is happy with this arrangement.  She is continued on Wellbutrin and Effexor.    HTN- she is not checking her blood pressure.  She has a bp cuff at home.  Continues amlodipine 7.5mg  once daily, zestoretic.     BP Readings from Last 3 Encounters:  11/28/19 130/86  10/12/19 138/88  10/07/19 (!) 145/92   She is working with ortho for knee pain- trying to lose enough weight for knee surgery. Has left leg pain. Feels like she has pain in the left lower lateral leg and radiates into the foot.  Describes a "deep ache."    GERD-  Maintained on omeprazole 40mg .     She has not had a flu shot or a covid vaccine.   ENT- feels like she has hoarseness, sore throat.   Observations/Objective:   Gen: Awake, alert, no acute distress Resp: Breathing is even and non-labored Psych: calm/pleasant demeanor Neuro: Alert and Oriented x 3, + facial symmetry, speech is clear.   Assessment and Plan:  Depression- stable/improved. Continue wellbutrin XL 300mg  and effexor xr 150 mg.    HTN- control  unknown. I have asked the patient to check her blood pressure when she gets home and to send me her reading via mychart.  GERD- notes a globus sensation- states she saw ENT and "they didn't see anything wrong."  She is advised to continue omeprazole 40mg  once daily and we discussed gerd precautions/diet.  Enlarged tonsils- she is requesting referral to a different ENT to discuss tonsillectomy.  Severe OSA- It looks like from the chart that the pulmonary office had trouble getting in touch with her.  I advised her to make sure her mailbox is empty in case they need to leave a message for her.  I will reach out to the pulmonary team to see I they can get this set up for her.   Immunization counseling- recommended that she obtain covid vaccine and flu vaccines ASAP.    Osteoarthritis/Knee pain- she is working with orthopedics.  I recommended that she also address her leg pain with them- it sounds radicular in nature.   Follow Up Instructions:    I discussed the assessment and treatment plan with the patient. The patient was provided an opportunity to ask questions and all were answered. The patient agreed with the plan and demonstrated an understanding of the instructions.   The patient was advised to call back or seek an in-person evaluation if the symptoms worsen or if the condition fails to improve as anticipated.  Melquan Ernsberger  Rowe Pavy, NP

## 2020-03-20 NOTE — Telephone Encounter (Signed)
Dr. Halford Chessman, it looks like your office had a lot of trouble getting in touch with the patient to set up her CPAP for OSA.  I spoke with her today and she is ready to proceed with getting that set up. Would you mind having your staff reach back out to her please?  Tks

## 2020-03-21 NOTE — Telephone Encounter (Signed)
Attempted to call patient and was unable to reach at this time or leave a message due to mailbox being full. Will attempt to call again at another time.

## 2020-03-22 ENCOUNTER — Other Ambulatory Visit: Payer: Self-pay | Admitting: Family

## 2020-03-22 ENCOUNTER — Encounter: Payer: Self-pay | Admitting: Family

## 2020-03-22 MED ORDER — METOPROLOL SUCCINATE ER 25 MG PO TB24: 25.0000 mg | ORAL_TABLET | Freq: Every day | ORAL | 0 refills | Status: DC

## 2020-03-22 NOTE — Telephone Encounter (Signed)
Discussed sleep study results from November 2021 with patient. She is aware of recommendations to start auto CPAP 5-20cm h20 per Dr. Halford Chessman. She will call office once she gets machine to set up follow-up in 1-2 months. DME order placed.

## 2020-03-23 NOTE — Telephone Encounter (Signed)
Called and went over HST results per Dr Halford Chessman with patient. All questions answered and patient expressed full understanding and agreeable to Dr Juanetta Gosling recommendations for ordering CPAP and settings. Order placed per Dr Halford Chessman. Patient expressed full understanding to call office once she gets the CPAP to set up an office visit with NP or Dr Halford Chessman 2 months after set up. Nothing further needed at this time.

## 2020-03-23 NOTE — Addendum Note (Signed)
Addended by: Merrilee Seashore on: 03/23/2020 09:47 AM   Modules accepted: Orders

## 2020-03-28 ENCOUNTER — Ambulatory Visit: Payer: Medicare Other | Admitting: Physical Medicine and Rehabilitation

## 2020-03-29 ENCOUNTER — Ambulatory Visit (INDEPENDENT_AMBULATORY_CARE_PROVIDER_SITE_OTHER): Payer: Medicare Other | Admitting: Physical Medicine and Rehabilitation

## 2020-03-29 ENCOUNTER — Encounter: Payer: Self-pay | Admitting: Physical Medicine and Rehabilitation

## 2020-03-29 ENCOUNTER — Ambulatory Visit: Payer: Self-pay

## 2020-03-29 ENCOUNTER — Other Ambulatory Visit: Payer: Self-pay

## 2020-03-29 DIAGNOSIS — M25551 Pain in right hip: Secondary | ICD-10-CM | POA: Diagnosis not present

## 2020-03-29 MED ORDER — TRIAMCINOLONE ACETONIDE 40 MG/ML IJ SUSP
60.0000 mg | INTRAMUSCULAR | Status: AC | PRN
  Administered 2020-03-29: 60 mg via INTRA_ARTICULAR

## 2020-03-29 MED ORDER — BUPIVACAINE HCL 0.25 % IJ SOLN
4.0000 mL | INTRAMUSCULAR | Status: AC | PRN
  Administered 2020-03-29: 4 mL via INTRA_ARTICULAR

## 2020-03-29 NOTE — Progress Notes (Signed)
Right groin and right buttock pain. Numeric Pain Rating Scale and Functional Assessment Average Pain 10   In the last MONTH (on 0-10 scale) has pain interfered with the following?  1. General activity like being  able to carry out your everyday physical activities such as walking, climbing stairs, carrying groceries, or moving a chair?  Rating(8)   -Dye Allergies.

## 2020-03-29 NOTE — Progress Notes (Signed)
Hailey Vance - 55 y.o. female MRN [id number]  Date of birth: [birth year]  Office Visit Note: Visit Date: 03/29/2020 PCP: Debbrah Alar, NP Referred by: Debbrah Alar, NP  Subjective: Chief Complaint  Patient presents with  . Right Hip - Pain   HPI:  [name] is a 55 y.o. female who comes in today for planned repeat Right anesthetic hip arthrogram with fluoroscopic guidance.  The patient has failed conservative care including home exercise, medications, time and activity modification. Prior injection gave more than 50% relief for several months. This injection will be diagnostic and hopefully therapeutic.  Please see requesting physician notes for further details and justification.  Referring: Dr. Joni Fears   ROS Otherwise per HPI.  Assessment & Plan: Visit Diagnoses:    ICD-10-CM   1. Pain in right hip  M25.551 XR C-ARM NO REPORT    Plan: No additional findings.   Meds & Orders: No orders of the defined types were placed in this encounter.   Orders Placed This Encounter  Procedures  . Large Joint Inj  . XR C-ARM NO REPORT    Follow-up: Return for visit to requesting physician as needed.   Procedures: Large Joint Inj: R hip joint on 03/29/2020 11:00 AM Indications: diagnostic evaluation and pain Details: 22 G 3.5 in needle, fluoroscopy-guided anterior approach  Arthrogram: No  Medications: 4 mL bupivacaine 0.25 %; 60 mg triamcinolone acetonide 40 MG/ML Outcome: tolerated well, no immediate complications  There was excellent flow of contrast producing a partial arthrogram of the hip. The patient did have relief of symptoms during the anesthetic phase of the injection. Procedure, treatment alternatives, risks and benefits explained, specific risks discussed. Consent was given by the patient. Immediately prior to procedure a time out was called to verify the correct patient, procedure, equipment, support staff and site/side marked as required.  Patient was prepped and draped in the usual sterile fashion.          Clinical History: No specialty comments available.     Objective:  VS:  HT:    WT:   BMI:     BP:   HR: bpm  TEMP: ( )  RESP:  Physical Exam Vitals and nursing note reviewed.  Constitutional:      General: She is not in acute distress.    Appearance: Normal appearance. She is obese. She is not ill-appearing.  HENT:     Head: Normocephalic and atraumatic.     Right Ear: External ear normal.     Left Ear: External ear normal.  Eyes:     Extraocular Movements: Extraocular movements intact.  Cardiovascular:     Rate and Rhythm: Normal rate.     Pulses: Normal pulses.  Pulmonary:     Effort: Pulmonary effort is normal. No respiratory distress.  Abdominal:     General: There is no distension.     Palpations: Abdomen is soft.  Musculoskeletal:        General: Tenderness present.     Cervical back: Neck supple.     Right lower leg: No edema.     Left lower leg: No edema.     Comments: Patient has good distal strength with antalgic gait to the right using a walker.  Skin:    Findings: No erythema, lesion or rash.  Neurological:     General: No focal deficit present.     Mental Status: She is alert and oriented to person, place, and time.  Sensory: No sensory deficit.     Motor: No weakness or abnormal muscle tone.     Coordination: Coordination normal.  Psychiatric:        Mood and Affect: Mood normal.        Behavior: Behavior normal.      Imaging: No results found.

## 2020-04-02 DIAGNOSIS — J351 Hypertrophy of tonsils: Secondary | ICD-10-CM | POA: Diagnosis not present

## 2020-04-02 DIAGNOSIS — R490 Dysphonia: Secondary | ICD-10-CM | POA: Diagnosis not present

## 2020-04-02 DIAGNOSIS — R1312 Dysphagia, oropharyngeal phase: Secondary | ICD-10-CM | POA: Diagnosis not present

## 2020-04-02 DIAGNOSIS — K219 Gastro-esophageal reflux disease without esophagitis: Secondary | ICD-10-CM | POA: Diagnosis not present

## 2020-04-12 DIAGNOSIS — M5416 Radiculopathy, lumbar region: Secondary | ICD-10-CM | POA: Diagnosis not present

## 2020-04-12 DIAGNOSIS — M170 Bilateral primary osteoarthritis of knee: Secondary | ICD-10-CM | POA: Diagnosis not present

## 2020-05-18 DIAGNOSIS — Z7689 Persons encountering health services in other specified circumstances: Secondary | ICD-10-CM | POA: Diagnosis not present

## 2020-05-18 DIAGNOSIS — Z1231 Encounter for screening mammogram for malignant neoplasm of breast: Secondary | ICD-10-CM | POA: Diagnosis not present

## 2020-05-18 LAB — HM MAMMOGRAPHY

## 2020-05-22 ENCOUNTER — Ambulatory Visit: Payer: Medicare Other | Admitting: Orthopaedic Surgery

## 2020-06-05 ENCOUNTER — Other Ambulatory Visit: Payer: Self-pay | Admitting: Otolaryngology

## 2020-06-05 ENCOUNTER — Other Ambulatory Visit (HOSPITAL_COMMUNITY): Payer: Self-pay | Admitting: Otolaryngology

## 2020-06-05 DIAGNOSIS — R1312 Dysphagia, oropharyngeal phase: Secondary | ICD-10-CM | POA: Diagnosis not present

## 2020-06-05 DIAGNOSIS — K219 Gastro-esophageal reflux disease without esophagitis: Secondary | ICD-10-CM | POA: Diagnosis not present

## 2020-06-08 ENCOUNTER — Ambulatory Visit (HOSPITAL_COMMUNITY): Admission: RE | Admit: 2020-06-08 | Payer: Medicare Other | Source: Ambulatory Visit

## 2020-06-08 ENCOUNTER — Encounter (HOSPITAL_COMMUNITY): Payer: Self-pay

## 2020-06-09 ENCOUNTER — Other Ambulatory Visit: Payer: Self-pay | Admitting: Family

## 2020-06-24 ENCOUNTER — Other Ambulatory Visit: Payer: Self-pay | Admitting: Family

## 2020-07-02 DIAGNOSIS — H04123 Dry eye syndrome of bilateral lacrimal glands: Secondary | ICD-10-CM | POA: Diagnosis not present

## 2020-07-04 ENCOUNTER — Other Ambulatory Visit: Payer: Self-pay

## 2020-07-04 ENCOUNTER — Ambulatory Visit (HOSPITAL_COMMUNITY)
Admission: RE | Admit: 2020-07-04 | Discharge: 2020-07-04 | Disposition: A | Discharge status: Home / Self Care | Payer: Medicare Other | Source: Ambulatory Visit | Attending: Otolaryngology | Admitting: Otolaryngology

## 2020-07-04 DIAGNOSIS — R1310 Dysphagia, unspecified: Secondary | ICD-10-CM | POA: Diagnosis not present

## 2020-07-04 DIAGNOSIS — R1312 Dysphagia, oropharyngeal phase: Secondary | ICD-10-CM | POA: Diagnosis not present

## 2020-07-18 DIAGNOSIS — G4733 Obstructive sleep apnea (adult) (pediatric): Secondary | ICD-10-CM | POA: Diagnosis not present

## 2020-08-06 ENCOUNTER — Ambulatory Visit: Payer: Medicare Other | Admitting: Physician Assistant

## 2020-08-06 ENCOUNTER — Encounter: Payer: Self-pay | Admitting: Family

## 2020-08-06 DIAGNOSIS — R1310 Dysphagia, unspecified: Secondary | ICD-10-CM

## 2020-08-09 ENCOUNTER — Telehealth: Payer: Self-pay | Admitting: Physical Medicine and Rehabilitation

## 2020-08-09 NOTE — Telephone Encounter (Signed)
Right hip injection 03/29/20. Please advise.

## 2020-08-09 NOTE — Telephone Encounter (Signed)
Pt called to get an appt for a hip inj   [phone]

## 2020-08-10 NOTE — Telephone Encounter (Signed)
Scheduled for 6/13 at 1415.

## 2020-08-13 ENCOUNTER — Encounter: Payer: Self-pay | Admitting: Physical Medicine and Rehabilitation

## 2020-08-13 ENCOUNTER — Other Ambulatory Visit: Payer: Self-pay

## 2020-08-13 ENCOUNTER — Ambulatory Visit (INDEPENDENT_AMBULATORY_CARE_PROVIDER_SITE_OTHER): Payer: Medicare Other | Admitting: Physical Medicine and Rehabilitation

## 2020-08-13 ENCOUNTER — Ambulatory Visit: Payer: Self-pay

## 2020-08-13 DIAGNOSIS — M25551 Pain in right hip: Secondary | ICD-10-CM

## 2020-08-13 NOTE — Progress Notes (Signed)
   Hailey Vance - 55 y.o. female MRN [id number]  Date of birth: [birth year]  Office Visit Note: Visit Date: 08/13/2020 PCP: Debbrah Alar, NP Referred by: Debbrah Alar, NP  Subjective: Chief Complaint  Patient presents with   Right Hip - Pain   HPI:  Hailey Vance is a 55 y.o. female who comes in today at the request of Dr. Joni Fears for planned Right anesthetic hip arthrogram with fluoroscopic guidance.  The patient has failed conservative care including home exercise, medications, time and activity modification.  This injection will be diagnostic and hopefully therapeutic.  Please see requesting physician notes for further details and justification.  ROS Otherwise per HPI.  Assessment & Plan: Visit Diagnoses:    ICD-10-CM   1. Pain in right hip  M25.551 XR C-ARM NO REPORT    Large Joint Inj: R hip joint      Plan: No additional findings.   Meds & Orders: No orders of the defined types were placed in this encounter.   Orders Placed This Encounter  Procedures   Large Joint Inj: R hip joint   XR C-ARM NO REPORT    Follow-up: Return if symptoms worsen or fail to improve.   Procedures: Large Joint Inj: R hip joint on 08/13/2020 2:46 PM Indications: diagnostic evaluation and pain Details: 22 G 3.5 in needle, fluoroscopy-guided anterior approach  Arthrogram: No  Medications: 4 mL bupivacaine 0.25 %; 60 mg triamcinolone acetonide 40 MG/ML Outcome: tolerated well, no immediate complications  There was excellent flow of contrast producing a partial arthrogram of the hip. The patient did have relief of symptoms during the anesthetic phase of the injection. Procedure, treatment alternatives, risks and benefits explained, specific risks discussed. Consent was given by the patient. Immediately prior to procedure a time out was called to verify the correct patient, procedure, equipment, support staff and site/side marked as required. Patient was prepped and  draped in the usual sterile fashion.         Clinical History: No specialty comments available.     Objective:  VS:  HT:    WT:   BMI:     BP:   HR: bpm  TEMP: ( )  RESP:  Physical Exam   Imaging: No results found.

## 2020-08-13 NOTE — Progress Notes (Signed)
Pt state right hip pain. Pt state walking, standing and laying down makes the pain worse. Pt state she uses heating pads, pain creams and over the counter pain meds. Pt has hx of inj on 03/29/20 pt state it helped.  Numeric Pain Rating Scale and Functional Assessment Average Pain 10   In the last MONTH (on 0-10 scale) has pain interfered with the following?  1. General activity like being  able to carry out your everyday physical activities such as walking, climbing stairs, carrying groceries, or moving a chair?  Rating(10)    -BT, -Dye Allergies.

## 2020-08-15 ENCOUNTER — Ambulatory Visit: Payer: Medicare Other | Admitting: Family

## 2020-08-15 MED ORDER — TRIAMCINOLONE ACETONIDE 40 MG/ML IJ SUSP
60.0000 mg | INTRAMUSCULAR | Status: AC | PRN
  Administered 2020-08-13: 60 mg via INTRA_ARTICULAR

## 2020-08-15 MED ORDER — BUPIVACAINE HCL 0.25 % IJ SOLN
4.0000 mL | INTRAMUSCULAR | Status: AC | PRN
  Administered 2020-08-13: 4 mL via INTRA_ARTICULAR

## 2020-08-22 ENCOUNTER — Ambulatory Visit (INDEPENDENT_AMBULATORY_CARE_PROVIDER_SITE_OTHER): Payer: Medicare Other | Admitting: Orthopaedic Surgery

## 2020-08-22 ENCOUNTER — Ambulatory Visit (INDEPENDENT_AMBULATORY_CARE_PROVIDER_SITE_OTHER): Payer: Medicare Other | Admitting: Family

## 2020-08-22 ENCOUNTER — Encounter (HOSPITAL_BASED_OUTPATIENT_CLINIC_OR_DEPARTMENT_OTHER): Payer: Self-pay | Admitting: Family

## 2020-08-22 ENCOUNTER — Other Ambulatory Visit: Payer: Self-pay

## 2020-08-22 ENCOUNTER — Encounter: Payer: Self-pay | Admitting: Orthopaedic Surgery

## 2020-08-22 ENCOUNTER — Ambulatory Visit: Payer: Medicare Other

## 2020-08-22 ENCOUNTER — Ambulatory Visit
Admission: RE | Admit: 2020-08-22 | Discharge: 2020-08-22 | Disposition: A | Discharge status: Home / Self Care | Payer: Medicare Other | Source: Ambulatory Visit | Attending: Family | Admitting: Family

## 2020-08-22 VITALS — Ht 64.0 in | Wt 310.0 lb

## 2020-08-22 VITALS — BP 143/86 | HR 91 | Temp 99.0°F | Resp 16 | Ht 64.0 in | Wt 310.0 lb

## 2020-08-22 DIAGNOSIS — R1310 Dysphagia, unspecified: Secondary | ICD-10-CM | POA: Diagnosis not present

## 2020-08-22 DIAGNOSIS — Z23 Encounter for immunization: Secondary | ICD-10-CM | POA: Diagnosis not present

## 2020-08-22 DIAGNOSIS — R635 Abnormal weight gain: Secondary | ICD-10-CM | POA: Diagnosis not present

## 2020-08-22 DIAGNOSIS — M170 Bilateral primary osteoarthritis of knee: Secondary | ICD-10-CM

## 2020-08-22 DIAGNOSIS — N200 Calculus of kidney: Secondary | ICD-10-CM | POA: Diagnosis not present

## 2020-08-22 DIAGNOSIS — M1712 Unilateral primary osteoarthritis, left knee: Secondary | ICD-10-CM

## 2020-08-22 DIAGNOSIS — M5416 Radiculopathy, lumbar region: Secondary | ICD-10-CM

## 2020-08-22 DIAGNOSIS — M5116 Intervertebral disc disorders with radiculopathy, lumbar region: Secondary | ICD-10-CM | POA: Diagnosis not present

## 2020-08-22 DIAGNOSIS — E785 Hyperlipidemia, unspecified: Secondary | ICD-10-CM | POA: Insufficient documentation

## 2020-08-22 DIAGNOSIS — E538 Deficiency of other specified B group vitamins: Secondary | ICD-10-CM | POA: Insufficient documentation

## 2020-08-22 DIAGNOSIS — Z6841 Body Mass Index (BMI) 40.0 and over, adult: Secondary | ICD-10-CM

## 2020-08-22 DIAGNOSIS — F331 Major depressive disorder, recurrent, moderate: Secondary | ICD-10-CM

## 2020-08-22 DIAGNOSIS — M1711 Unilateral primary osteoarthritis, right knee: Secondary | ICD-10-CM

## 2020-08-22 DIAGNOSIS — I10 Essential (primary) hypertension: Secondary | ICD-10-CM | POA: Diagnosis not present

## 2020-08-22 DIAGNOSIS — M4726 Other spondylosis with radiculopathy, lumbar region: Secondary | ICD-10-CM | POA: Diagnosis not present

## 2020-08-22 LAB — COMPREHENSIVE METABOLIC PANEL
ALT: 14 U/L (ref 0–35)
AST: 15 U/L (ref 0–37)
Albumin: 3.8 g/dL (ref 3.5–5.2)
Alkaline Phosphatase: 105 U/L (ref 39–117)
BUN: 18 mg/dL (ref 6–23)
CO2: 26 mEq/L (ref 19–32)
Calcium: 8.9 mg/dL (ref 8.4–10.5)
Chloride: 105 mEq/L (ref 96–112)
Creatinine, Ser: 1.26 mg/dL — ABNORMAL HIGH (ref 0.40–1.20)
GFR: 47.72 mL/min — ABNORMAL LOW (ref >60.00)
Glucose, Bld: 97 mg/dL (ref 70–99)
Potassium: 4 mEq/L (ref 3.5–5.1)
Sodium: 142 mEq/L (ref 135–145)
Total Bilirubin: 0.5 mg/dL (ref 0.2–1.2)
Total Protein: 6.6 g/dL (ref 6.0–8.3)

## 2020-08-22 LAB — TSH: TSH: 3.16 u[IU]/mL (ref 0.35–4.50)

## 2020-08-22 LAB — LIPID PANEL
Cholesterol: 199 mg/dL (ref 0–200)
HDL: 83 mg/dL (ref >39.00)
LDL Cholesterol: 93 mg/dL (ref 0–99)
NonHDL: 115.54
Total CHOL/HDL Ratio: 2
Triglycerides: 115 mg/dL (ref 0.0–149.0)
VLDL: 23 mg/dL (ref 0.0–40.0)

## 2020-08-22 LAB — VITAMIN B12: Vitamin B-12: 481 pg/mL (ref 211–911)

## 2020-08-22 MED ORDER — METHYLPREDNISOLONE ACETATE 40 MG/ML IJ SUSP
80.0000 mg | INTRAMUSCULAR | Status: AC | PRN
  Administered 2020-08-22: 80 mg via INTRA_ARTICULAR

## 2020-08-22 MED ORDER — CELECOXIB 100 MG PO CAPS: 100.0000 mg | ORAL_CAPSULE | Freq: Two times a day (BID) | ORAL | 1 refills | Status: DC | PRN

## 2020-08-22 MED ORDER — LIDOCAINE HCL 1 % IJ SOLN
2.0000 mL | INTRAMUSCULAR | Status: AC | PRN
  Administered 2020-08-22: 2 mL

## 2020-08-22 MED ORDER — BUPIVACAINE HCL 0.5 % IJ SOLN
2.0000 mL | INTRAMUSCULAR | Status: AC | PRN
  Administered 2020-08-22: 2 mL via INTRA_ARTICULAR

## 2020-08-22 MED ORDER — GABAPENTIN 100 MG PO CAPS: 100.0000 mg | ORAL_CAPSULE | Freq: Three times a day (TID) | ORAL | 3 refills | Status: DC

## 2020-08-22 NOTE — Assessment & Plan Note (Signed)
Wt Readings from Last 3 Encounters:  08/22/20 (!) 310 lb (140.6 kg)  02/21/20 279 lb (126.6 kg)  11/28/19 288 lb (130.6 kg)   Weight has gone up significantly. Discussed diet/exercise and weight loss.

## 2020-08-22 NOTE — Patient Instructions (Addendum)
Restart Celebrex. Add gabapentin 100mg  3x day for pain. Complete x-ray on the first floor.  Call [facility] [place]) [phone]

## 2020-08-22 NOTE — Progress Notes (Signed)
Subjective:   By signing my name below, I, [name], attest that this documentation has been prepared under the direction and in the presence of [name]. 08/22/2020    Patient ID: Hailey Vance, female    DOB: [birth year], 55 y.o.   MRN: [id number]  Chief Complaint  Patient presents with   Back Pain    Complains of lower back pain    Leg Pain    Complains of pain on both legs, "from the knee down"    HPI Patient is in today for a office visit. She complains of lower back pain that radiates into the left leg. She has a history of this pain back in 2015. While laying down the pain twists into her left leg and cause her toes to twitch. She stopped using 100 mg Celebrex 2x daily PRN because she ran out there was no refill. She is willing to try physical therapy near her other residence in [place], [place]. Bladder- Her bladder has started to feel slightly weaker. If she is not conscious of it while coughing she can have leaks. Otherwise she has not had any other changes.  Vitamin B12- She continues taking vitamin B12 supplements. Mammogram- She is UTD on mammograms. Acid Reflux- She continues taking 40 mg omeprazole daily PO. During recent visit with ENT, she had a procedure which revealed the following:   IMPRESSION: 1. Apparent mild narrowing of the gastroesophageal junction, estimated at 9-10 mm. This along with esophageal dysmotility, reflected by tertiary contractions and barium stasis, led to barium stasis within the esophagus. 2. No mass. No evidence of esophagitis. No reflux documented during the exam.She is planning to make a follow up appointment to manage that issue. She is requesting a referral to another gastrointestinal physician. She takes another medication but does not remember the name of it at this time.   Depression- She reports having mild depression at this time. She's upset that she is not able to walk to lose weight. She is also upset about  factors she cannot control in her life. Her sons father had passed away recently. Her daughter is having medical issues. She continues taking 300 mg Wellbutrin daily PO and 100 mg 150 mg Effexor daily PO to manage her symptoms. She has tried counseling in the past but did not like her counselor so she canceled her remaining appointments. She is willing to try counseling again with another provider.  Immunizations- She does not have the Covid-19 vaccines. She is wiling to get the vaccine at a later date. Hypertension- Her blood pressure is controled during this visit. She did not take her blood pressure medication today because she was in a rush to make this appointment. She continues taking 2.5 mg amlodipine daily PO and 25 mg metoprolol succinate daily PO and 20-25 mg lisinopril-hydrochlorothiazide daily PO.  BP Readings from Last 3 Encounters:  08/22/20 (!) 143/86  11/28/19 130/86  10/12/19 138/88    Health Maintenance Due  Topic Date Due   Zoster Vaccines- Shingrix (1 of 2) Never done   PAP SMEAR-Modifier  10/01/2017    Past Medical History:  Diagnosis Date   Bilateral primary osteoarthritis of knee 03/29/2019   Chronic back pain    Cystocele with rectocele    Generalized anxiety disorder    GERD (gastroesophageal reflux disease)    Headache    otc med prn   Heart murmur    echo in epic 11-26-2017  normal w/ ef 55-60%   History  of chest pain    pt referred for atyical chest pain , cardiology evaluation w/ dr berry note in epic 10-25-2014, stated with low risk w/ no ischemia , nuclear ef 70% , done 09-19-2014 in epic,  non-cardiac chest pain   HTN (hypertension)    followed by pcp    (normal nuclear stress test 09-19-2014 in epic)   Low blood potassium    Major depressive disorder    OSA (obstructive sleep apnea)    04/2015 study in e- Severe OSA (AHI = 55.0/hour);   (10-06-2019  per pt has not used cpap since 2019 after gastric bypass)   Osteoarthritis of both hips 06/15/2019    Plantar fasciitis of left foot    S/P gastric bypass 02/15/2018   sleeve   SUI (stress urinary incontinence, female)    Vitamin D deficiency 09/06/2014   Wears glasses     Past Surgical History:  Procedure Laterality Date   BLADDER SUSPENSION N/A 10/12/2019   Procedure: TRANSVAGINAL TAPE (TVT) PROCEDURE;  Surgeon: Bobbye Charleston, MD;  Location: [facility];  Service: Gynecology;  Laterality: N/A;   CESAREAN SECTION W/BTL  1990   COLONOSCOPY WITH PROPOFOL N/A 08/30/2015   Procedure: COLONOSCOPY WITH PROPOFOL;  Surgeon: Mauri Pole, MD;  [facility];  Service: Endoscopy;  Laterality: N/A;   CYSTOCELE REPAIR N/A 10/12/2019   Procedure: ANTERIOR (CYSTOCELE);  Surgeon: Bobbye Charleston, MD;  Location[facility];  Service: Gynecology;  Laterality: N/A;   CYSTOSCOPY N/A 05/23/2015   Procedure: CYSTOSCOPY;  Surgeon: Bobbye Charleston, MD;  Location: [facility];  Service: Gynecology;  Laterality: N/A;   CYSTOSCOPY N/A 10/12/2019   Procedure: CYSTOSCOPY;  Surgeon: Bobbye Charleston, MD;  Location: [facility];  Service: Gynecology;  Laterality: N/A;   HIATAL HERNIA REPAIR N/A 02/15/2018   Procedure: HERNIA REPAIR HIATAL;  Surgeon: Excell Seltzer, MD;  Location: [facility];  Service: General;  Laterality: N/A;   HYSTEROSCOPY WITH D & C N/A 01/11/2015   Procedure: DILATATION AND CURETTAGE /HYSTEROSCOPY;  Surgeon: Jerelyn Charles, MD;  Location: [facility];  Service: Gynecology;  Laterality: N/A;   KNEE ARTHROSCOPY Right 2010   LAPAROSCOPIC GASTRIC SLEEVE RESECTION N/A 02/15/2018   Procedure: LAPAROSCOPIC GASTRIC SLEEVE RESECTION WITH UPPER ENDO AND ERAS PATHWAY;  Surgeon: Excell Seltzer, MD;  Location: [facility];  Service: General;  Laterality: N/A;   LAPAROSCOPIC OOPHORECTOMY Bilateral 2004   LEFT OOPHORECTOMY AND PARTIAL RIGHT OOPHORECTOMY   LIPOMA EXCISION Left 12/02/2016   Procedure: EXCISION LIPOMA OF LEFT SHOULDER;  Surgeon: Garald Balding, MD;  Location: [facility];  Service: Orthopedics;  Laterality: Left;   ROBOTIC ASSISTED TOTAL HYSTERECTOMY WITH SALPINGECTOMY Bilateral 05/23/2015   Procedure: ROBOTIC ASSISTED TOTAL HYSTERECTOMY WITH SALPINGECTOMYwith right oophorectomy;  Surgeon: Bobbye Charleston, MD;  Location: [facility];  Service: Gynecology;  Laterality: Bilateral;   UMBILICAL HERNIA REPAIR  2002    Family History  Problem Relation Age of Onset   Lung cancer Mother 99       dec   Cancer Mother    Hypertension Mother    Arthritis Mother    Cancer Father 9       prostate   Hypertension Father    Leukemia Sister    Hypertension Sister    Arthritis Sister    Hyperlipidemia Sister    Hypertension Brother    Arthritis Brother    Multiple sclerosis Sister    Hypertension Sister    Hypertension Sister    Hypertension Brother  Hypertension Brother    Breast cancer Paternal Aunt    Alzheimer's disease Paternal Aunt    Diabetes Maternal Grandmother    Stroke Maternal Grandmother    Heart attack Paternal Grandmother    Asthma Other     Social History   Socioeconomic History   Marital status: Divorced    Spouse name: Not on file   Number of children: Not on file   Years of education: 12   Highest education level: High school graduate  Occupational History   Occupation: Disability  Tobacco Use   Smoking status: Never   Smokeless tobacco: Never  Vaping Use   Vaping Use: Never used  Substance and Sexual Activity   Alcohol use: No    Alcohol/week: 0.0 standard drinks   Drug use: Never   Sexual activity: Not on file    Comment: BTL w/ C/S in 1990  Other Topics Concern   Not on file  Social History Narrative   Divorced   Lives with son and grand-daughter   40- [place] (lives in [place])   [place] lives with patient   57- Daughter [name] (lives in [place])   Works at [employer]   Completed some college   Careers adviser   Social Determinants of Adult nurse Strain: Not on file  Food Insecurity: Not on file  Transportation Needs: Not on file  Physical Activity: Not on file  Stress: Not on file  Social Connections: Not on file  Intimate Partner Violence: Not on file    Outpatient Medications Prior to Visit  Medication Sig Dispense Refill   amLODipine (NORVASC) 2.5 MG tablet Take 3 tablets (7.5 mg total) by mouth daily. 90 tablet 3   Artificial Tear Ointment (DRY EYES OP) Apply to eye as needed.     buPROPion (WELLBUTRIN XL) 300 MG 24 hr tablet Take 1 tablet (300 mg total) by mouth daily. 90 tablet 1   Ca Phosphate-Cholecalciferol (CALTRATE GUMMY BITES PO) Take by mouth daily.      Cholecalciferol (VITAMIN D3) 50 MCG (2000 UT) CHEW Chew 8,000 Units by mouth daily.     Cyanocobalamin (B-12) 500 MCG TABS Take 1 tablet by mouth daily. 30 tablet    lisinopril-hydrochlorothiazide (ZESTORETIC) 20-25 MG tablet Take 1 tablet by mouth once daily 90 tablet 1   Menthol, Topical Analgesic, 4 % GEL Apply 1 application topically daily.     metoprolol succinate (TOPROL-XL) 25 MG 24 hr tablet Take 1 tablet (25 mg total) by mouth daily. 90 tablet 0   multivitamin-iron-minerals-folic acid (CENTRUM) chewable tablet Chew 1 tablet by mouth daily.     omeprazole (PRILOSEC) 40 MG capsule Take 1 capsule (40 mg total) by mouth daily. 90 capsule 3   venlafaxine XR (EFFEXOR-XR) 150 MG 24 hr capsule Take 1 capsule (150 mg total) by mouth daily with breakfast. 90 capsule 1   cyclobenzaprine (FLEXERIL) 5 MG tablet Take 1 tablet (5 mg total) by mouth at bedtime as needed for muscle spasms. 15 tablet 0   celecoxib (CELEBREX) 100 MG capsule Take 1 capsule (100 mg total) by mouth 2 (two) times daily as needed. (Patient not taking: Reported on 08/22/2020) 60 capsule 1   meloxicam (MOBIC) 7.5 MG tablet Take 1 tablet (7.5 mg total) by mouth daily as needed for pain. 15 tablet 0   No facility-administered medications prior to visit.    Allergies  Allergen  Reactions   Penicillins Itching and Other (See Comments)  Has patient had a PCN reaction causing immediate rash, facial/tongue/throat swelling, SOB or lightheadedness with hypotension: No Has patient had a PCN reaction causing severe rash involving mucus membranes or skin necrosis: No Has patient had a PCN reaction that required hospitalization No Has patient had a PCN reaction occurring within the last 10 years: No If all of the above answers are "NO", then may proceed with Cephalosporin use.     ROS     Objective:    Physical Exam Constitutional:      General: She is not in acute distress.    Appearance: Normal appearance. She is not ill-appearing.  HENT:     Head: Normocephalic and atraumatic.     Right Ear: External ear normal.     Left Ear: External ear normal.  Eyes:     Extraocular Movements: Extraocular movements intact.     Pupils: Pupils are equal, round, and reactive to light.  Cardiovascular:     Rate and Rhythm: Normal rate and regular rhythm.     Pulses: Normal pulses.     Heart sounds: Murmur heard.  Systolic murmur is present with a grade of 1/6.    No gallop.  Pulmonary:     Effort: Pulmonary effort is normal. No respiratory distress.     Breath sounds: Normal breath sounds. No wheezing, rhonchi or rales.  Musculoskeletal:     Comments: 5/5 strength in lower extremities  Lymphadenopathy:     Cervical: No cervical adenopathy.  Skin:    General: Skin is warm and dry.  Neurological:     Mental Status: She is alert and oriented to person, place, and time.     Comments: 1+ Patellar reflexes (limited by habitus)  Psychiatric:        Behavior: Behavior normal.    BP (!) 143/86 (BP Location: Right Arm, Patient Position: Sitting, Cuff Size: Large)   Pulse 91   Temp 99 F (37.2 C) (Oral)   Resp 16   Ht $R'5\' 4"'zF$  (1.626 m)   Wt (!) 310 lb (140.6 kg)   LMP 10/02/2007   SpO2 98%   BMI 53.21 kg/m  Wt Readings from Last 3 Encounters:  08/22/20 (!) 310 lb  (140.6 kg)  08/22/20 (!) 310 lb (140.6 kg)  02/21/20 279 lb (126.6 kg)       Assessment & Plan:   Problem List Items Addressed This Visit       Unprioritized   Moderate episode of recurrent major depressive disorder (HCC)   Lumbar radiculopathy   Relevant Medications   gabapentin (NEURONTIN) 100 MG capsule   Other Relevant Orders   DG Lumbar Spine Complete   Ambulatory referral to Physical Therapy   Hyperlipidemia   Relevant Orders   Lipid panel (Completed)   Essential hypertension - Primary    BP Readings from Last 3 Encounters:  08/22/20 (!) 143/86  11/28/19 130/86  10/12/19 138/88  bp is acceptable. Continue zestoretic 20-25 and amlodipine 2.$RemoveBeforeDE'5mg'lwYtwJQztePXPCH$  daily.        Relevant Orders   Comp Met (CMET) (Completed)   Dysphagia   Relevant Orders   Ambulatory referral to Gastroenterology   BMI 50.0-59.9, adult ([place])    Wt Readings from Last 3 Encounters:  08/22/20 (!) 310 lb (140.6 kg)  02/21/20 279 lb (126.6 kg)  11/28/19 288 lb (130.6 kg)  Weight has gone up significantly. Discussed diet/exercise and weight loss.        B12 deficiency   Relevant Orders   Vitamin B12 (Completed)  Other Visit Diagnoses     Weight gain       Relevant Orders   TSH (Completed)        Meds ordered this encounter  Medications   celecoxib (CELEBREX) 100 MG capsule    Sig: Take 1 capsule (100 mg total) by mouth 2 (two) times daily as needed.    Dispense:  60 capsule    Refill:  1    Order Specific Question:   Supervising Provider    Answer:   Penni Homans A [[id number]]   gabapentin (NEURONTIN) 100 MG capsule    Sig: Take 1 capsule (100 mg total) by mouth 3 (three) times daily.    Dispense:  90 capsule    Refill:  3    Order Specific Question:   Supervising Provider    Answer:   Penni Homans A [[id number]]    I, [name], personally preformed the services described in this documentation.  All medical record entries made by the scribe were at my direction and in my  presence.  I have reviewed the chart and discharge instructions (if applicable) and agree that the record reflects my personal performance and is accurate and complete. 08/22/2020   I,[name],acting as a Education administrator for Nance Pear, NP.,have documented all relevant documentation on the behalf of Nance Pear, NP,as directed by  Nance Pear, NP while in the presence of Nance Pear, NP.   Nance Pear, NP

## 2020-08-22 NOTE — Progress Notes (Signed)
   Covid-19 Vaccination Clinic  Name:  Chianna Spirito    MRN: [id number] DOB: 1966/02/13  08/22/2020  Ms. Aramburo was observed post Covid-19 immunization for 15 minutes without incident. She was provided with Vaccine Information Sheet and instruction to access the V-Safe system.   Ms. Scruggs was instructed to call 911 with any severe reactions post vaccine: Difficulty breathing  Swelling of face and throat  A fast heartbeat  A bad rash all over body  Dizziness and weakness   Immunizations Administered     Name Date Dose VIS Date Route   PFIZER Comrnaty(Gray TOP) Covid-19 Vaccine 08/22/2020 12:23 PM 0.3 mL 02/09/2020 Intramuscular   Manufacturer: [place]   Lot: [id number]   [place]: [phone]

## 2020-08-22 NOTE — Progress Notes (Signed)
Office Visit Note   Patient: Hailey Vance           Date of Birth: 1965-04-04           MRN: [id number] Visit Date: 08/22/2020              Requested by: Debbrah Alar, NP [address] [place],  [place] [address] PCP: Debbrah Alar, NP   Assessment & Plan: Visit Diagnoses:  1. Bilateral primary osteoarthritis of knee   2. Primary osteoarthritis of both knees     Plan: Hailey Vance has end-stage bilateral knee osteoarthritis.  She is having considerable pain and, therefore, I will inject with cortisone.  She has had good response in the past although temporary.  She also has issues with both of her hips.  Dr. Ernestina Patches recently injected her right hip and she notes it made a big difference.  Also has been followed elsewhere for arthritis of her lumbar spine.  She thinks she may be gaining rather than losing weight.  Her last weight was 310 pounds  Follow-Up Instructions: Return if symptoms worsen or fail to improve.   Orders:  Orders Placed This Encounter  Procedures   Large Joint Inj: bilateral knee   No orders of the defined types were placed in this encounter.     Procedures: Large Joint Inj: bilateral knee on 08/22/2020 1:51 PM Indications: diagnostic evaluation Details: 25 G 1.5 in needle, anteromedial approach  Arthrogram: No  Medications (Right): 2 mL lidocaine 1 %; 2 mL bupivacaine 0.5 %; 80 mg methylPREDNISolone acetate 40 MG/ML Medications (Left): 2 mL lidocaine 1 %; 2 mL bupivacaine 0.5 %; 80 mg methylPREDNISolone acetate 40 MG/ML Consent was given by the patient. Immediately prior to procedure a time out was called to verify the correct patient, procedure, equipment, support staff and site/side marked as required. Patient was prepped and draped in the usual sterile fashion.      Clinical Data: No additional findings.   Subjective: Chief Complaint  Patient presents with   Left Knee - Pain   Right Knee - Pain  Patient presents  today for recurrent chronic bilateral knee pain. She states that the left side hurts more than the right. She states that typically her knees hurt anteriorly, but more recently she has developed pain at the lateral aspect of her leg and across her foot. She also has numbness in her foot and pain in her lower back. She takes celebrex and mobic as needed.   HPI  Review of Systems   Objective: Vital Signs: Ht 5\' 4"  (1.626 m)   Wt (!) 310 lb (140.6 kg)   LMP 10/02/2007   BMI 53.21 kg/m   Physical Exam Constitutional:      Appearance: She is well-developed.  Eyes:     Pupils: Pupils are equal, round, and reactive to light.  Pulmonary:     Effort: Pulmonary effort is normal.  Skin:    General: Skin is warm and dry.  Neurological:     Mental Status: She is alert and oriented to person, place, and time.  Psychiatric:        Behavior: Behavior normal.    Ortho Exam fairly large knees.  Full extension about 90 degrees of flexion.  No instability.  Mostly lateral joint pain on the left and medial joint pain on the right.  Difficult to determine if there is an effusion  Specialty Comments:  No specialty comments available.  Imaging: No results  found.   PMFS History: Patient Active Problem List   Diagnosis Date Noted   B12 deficiency 08/22/2020   Hyperlipidemia 08/22/2020   Moderate episode of recurrent major depressive disorder ([place]) 08/22/2020   Lumbar radiculopathy 08/22/2020   Major depressive disorder    Low back pain 06/15/2019   Osteoarthritis of both hips 06/15/2019   Morbid obesity ([place]) 02/15/2018   Lipoma of left shoulder 12/02/2016   BMI 50.0-59.9, adult ([place])    Essential hypertension 09/06/2014   Osteoarthritis 09/06/2014   Post-menopausal bleeding 09/06/2014   OSA (obstructive sleep apnea) 09/06/2014   Generalized anxiety disorder 09/06/2014   Vitamin D deficiency 09/06/2014   Chronic back pain    Past Medical History:  Diagnosis Date   Bilateral primary  osteoarthritis of knee 03/29/2019   Chronic back pain    Cystocele with rectocele    Generalized anxiety disorder    GERD (gastroesophageal reflux disease)    Headache    otc med prn   Heart murmur    echo in epic 11-26-2017  normal w/ ef 55-60%   History of chest pain    pt referred for atyical chest pain , cardiology evaluation w/ dr berry note in epic 10-25-2014, stated with low risk w/ no ischemia , nuclear ef 70% , done 09-19-2014 in epic,  non-cardiac chest pain   HTN (hypertension)    followed by pcp    (normal nuclear stress test 09-19-2014 in epic)   Low blood potassium    Major depressive disorder    OSA (obstructive sleep apnea)    04/2015 study in e- Severe OSA (AHI = 55.0/hour);   (10-06-2019  per pt has not used cpap since 2019 after gastric bypass)   Osteoarthritis of both hips 06/15/2019   Plantar fasciitis of left foot    S/P gastric bypass 02/15/2018   sleeve   SUI (stress urinary incontinence, female)    Vitamin D deficiency 09/06/2014   Wears glasses     Family History  Problem Relation Age of Onset   Lung cancer Mother 49       dec   Cancer Mother    Hypertension Mother    Arthritis Mother    Cancer Father 56       prostate   Hypertension Father    Leukemia Sister    Hypertension Sister    Arthritis Sister    Hyperlipidemia Sister    Hypertension Brother    Arthritis Brother    Multiple sclerosis Sister    Hypertension Sister    Hypertension Sister    Hypertension Brother    Hypertension Brother    Breast cancer Paternal Aunt    Alzheimer's disease Paternal Aunt    Diabetes Maternal Grandmother    Stroke Maternal Grandmother    Heart attack Paternal Grandmother    Asthma Other     Past Surgical History:  Procedure Laterality Date   BLADDER SUSPENSION N/A 10/12/2019   Procedure: TRANSVAGINAL TAPE (TVT) PROCEDURE;  Surgeon: Bobbye Charleston, MD;  Location: [facility];  Service: Gynecology;  Laterality: N/A;   CESAREAN SECTION  W/BTL  1990   COLONOSCOPY WITH PROPOFOL N/A 08/30/2015   Procedure: COLONOSCOPY WITH PROPOFOL;  Surgeon: Mauri Pole, MD;  [facility];  Service: Endoscopy;  Laterality: N/A;   CYSTOCELE REPAIR N/A 10/12/2019   Procedure: ANTERIOR (CYSTOCELE);  Surgeon: Bobbye Charleston, MD;  [facility];  Service: Gynecology;  Laterality: N/A;   CYSTOSCOPY N/A 05/23/2015   Procedure:  CYSTOSCOPY;  Surgeon: Bobbye Charleston, MD;  Location: [facility];  Service: Gynecology;  Laterality: N/A;   CYSTOSCOPY N/A 10/12/2019   Procedure: CYSTOSCOPY;  Surgeon: Bobbye Charleston, MD;  Location[facility];  Service: Gynecology;  Laterality: N/A;   HIATAL HERNIA REPAIR N/A 02/15/2018   Procedure: HERNIA REPAIR HIATAL;  Surgeon: Excell Seltzer, MD;  Location: [facility];  Service: General;  Laterality: N/A;   HYSTEROSCOPY WITH D & C N/A 01/11/2015   Procedure: DILATATION AND CURETTAGE /HYSTEROSCOPY;  Surgeon: Jerelyn Charles, MD;  Location: [facility];  Service: Gynecology;  Laterality: N/A;   KNEE ARTHROSCOPY Right 2010   LAPAROSCOPIC GASTRIC SLEEVE RESECTION N/A 02/15/2018   Procedure: LAPAROSCOPIC GASTRIC SLEEVE RESECTION WITH UPPER ENDO AND ERAS PATHWAY;  Surgeon: Excell Seltzer, MD;  Location: [facility];  Service: General;  Laterality: N/A;   LAPAROSCOPIC OOPHORECTOMY Bilateral 2004   LEFT OOPHORECTOMY AND PARTIAL RIGHT OOPHORECTOMY   LIPOMA EXCISION Left 12/02/2016   Procedure: EXCISION LIPOMA OF LEFT SHOULDER;  Surgeon: Garald Balding, MD;  Location: [facility];  Service: Orthopedics;  Laterality: Left;   ROBOTIC ASSISTED TOTAL HYSTERECTOMY WITH SALPINGECTOMY Bilateral 05/23/2015   Procedure: ROBOTIC ASSISTED TOTAL HYSTERECTOMY WITH SALPINGECTOMYwith right oophorectomy;  Surgeon: Bobbye Charleston, MD;  Location: [facility];  Service: Gynecology;  Laterality: Bilateral;   UMBILICAL HERNIA REPAIR  2002   Social History   Occupational History   Occupation: Disability   Tobacco Use   Smoking status: Never   Smokeless tobacco: Never  Vaping Use   Vaping Use: Never used  Substance and Sexual Activity   Alcohol use: No    Alcohol/week: 0.0 standard drinks   Drug use: Never   Sexual activity: Not on file    Comment: BTL w/ C/S in 1990

## 2020-08-23 ENCOUNTER — Other Ambulatory Visit (HOSPITAL_BASED_OUTPATIENT_CLINIC_OR_DEPARTMENT_OTHER): Payer: Self-pay

## 2020-08-23 DIAGNOSIS — R1310 Dysphagia, unspecified: Secondary | ICD-10-CM | POA: Insufficient documentation

## 2020-08-23 MED ORDER — COVID-19 MRNA VAC-TRIS(PFIZER) 30 MCG/0.3ML IM SUSP
INTRAMUSCULAR | 0 refills | Status: DC
  Filled 2020-08-23: qty 0.3, 1d supply, fill #0

## 2020-08-23 NOTE — Assessment & Plan Note (Signed)
BP Readings from Last 3 Encounters:  08/22/20 (!) 143/86  11/28/19 130/86  10/12/19 138/88   bp is acceptable. Continue zestoretic 20-25 and amlodipine 2.5mg  daily.

## 2020-08-23 NOTE — Assessment & Plan Note (Signed)
Fair control. Recommended that she try establishing with a new counselor.  Continue wellbutrin 300mg  once daily and effexor 150mg  once daily.

## 2020-08-23 NOTE — Assessment & Plan Note (Signed)
Will refer to GI for further evaluation.

## 2020-08-25 ENCOUNTER — Encounter: Payer: Self-pay | Admitting: Family

## 2020-08-27 MED ORDER — FAMOTIDINE 20 MG PO TABS: 20.0000 mg | ORAL_TABLET | Freq: Every day | ORAL | Status: DC

## 2020-08-28 ENCOUNTER — Encounter: Payer: Self-pay | Admitting: Family

## 2020-08-29 DIAGNOSIS — R531 Weakness: Secondary | ICD-10-CM | POA: Diagnosis not present

## 2020-08-29 DIAGNOSIS — R262 Difficulty in walking, not elsewhere classified: Secondary | ICD-10-CM | POA: Diagnosis not present

## 2020-08-29 DIAGNOSIS — M5416 Radiculopathy, lumbar region: Secondary | ICD-10-CM | POA: Diagnosis not present

## 2020-08-29 DIAGNOSIS — M5451 Vertebrogenic low back pain: Secondary | ICD-10-CM | POA: Diagnosis not present

## 2020-08-31 ENCOUNTER — Other Ambulatory Visit: Payer: Self-pay | Admitting: Family

## 2020-08-31 DIAGNOSIS — G4733 Obstructive sleep apnea (adult) (pediatric): Secondary | ICD-10-CM | POA: Diagnosis not present

## 2020-09-04 DIAGNOSIS — R262 Difficulty in walking, not elsewhere classified: Secondary | ICD-10-CM | POA: Diagnosis not present

## 2020-09-04 DIAGNOSIS — M5416 Radiculopathy, lumbar region: Secondary | ICD-10-CM | POA: Diagnosis not present

## 2020-09-04 DIAGNOSIS — R531 Weakness: Secondary | ICD-10-CM | POA: Diagnosis not present

## 2020-09-04 DIAGNOSIS — M5451 Vertebrogenic low back pain: Secondary | ICD-10-CM | POA: Diagnosis not present

## 2020-09-06 ENCOUNTER — Encounter: Payer: Self-pay | Admitting: Family

## 2020-09-06 DIAGNOSIS — M5416 Radiculopathy, lumbar region: Secondary | ICD-10-CM | POA: Diagnosis not present

## 2020-09-06 DIAGNOSIS — M5451 Vertebrogenic low back pain: Secondary | ICD-10-CM | POA: Diagnosis not present

## 2020-09-06 DIAGNOSIS — R262 Difficulty in walking, not elsewhere classified: Secondary | ICD-10-CM | POA: Diagnosis not present

## 2020-09-06 DIAGNOSIS — R531 Weakness: Secondary | ICD-10-CM | POA: Diagnosis not present

## 2020-09-07 ENCOUNTER — Ambulatory Visit: Payer: Self-pay | Admitting: Podiatry

## 2020-09-11 ENCOUNTER — Ambulatory Visit (INDEPENDENT_AMBULATORY_CARE_PROVIDER_SITE_OTHER): Payer: Medicare Other | Admitting: Orthopaedic Surgery

## 2020-09-11 ENCOUNTER — Ambulatory Visit (INDEPENDENT_AMBULATORY_CARE_PROVIDER_SITE_OTHER): Payer: Medicare Other | Admitting: Family

## 2020-09-11 ENCOUNTER — Other Ambulatory Visit: Payer: Self-pay

## 2020-09-11 ENCOUNTER — Ambulatory Visit: Payer: Self-pay

## 2020-09-11 ENCOUNTER — Encounter: Payer: Self-pay | Admitting: Family

## 2020-09-11 ENCOUNTER — Encounter: Payer: Self-pay | Admitting: Orthopaedic Surgery

## 2020-09-11 VITALS — Ht 64.0 in | Wt 310.0 lb

## 2020-09-11 VITALS — BP 126/84 | HR 87 | Temp 97.4°F | Resp 17

## 2020-09-11 DIAGNOSIS — G8929 Other chronic pain: Secondary | ICD-10-CM

## 2020-09-11 DIAGNOSIS — M19012 Primary osteoarthritis, left shoulder: Secondary | ICD-10-CM | POA: Diagnosis not present

## 2020-09-11 DIAGNOSIS — M25512 Pain in left shoulder: Secondary | ICD-10-CM

## 2020-09-11 MED ORDER — LIDOCAINE HCL 1 % IJ SOLN
2.0000 mL | INTRAMUSCULAR | Status: AC | PRN
  Administered 2020-09-11: 2 mL

## 2020-09-11 MED ORDER — METHYLPREDNISOLONE ACETATE 40 MG/ML IJ SUSP
80.0000 mg | INTRAMUSCULAR | Status: AC | PRN
  Administered 2020-09-11: 80 mg via INTRA_ARTICULAR

## 2020-09-11 MED ORDER — BUPIVACAINE HCL 0.25 % IJ SOLN
2.0000 mL | INTRAMUSCULAR | Status: AC | PRN
  Administered 2020-09-11: 2 mL via INTRA_ARTICULAR

## 2020-09-11 NOTE — Progress Notes (Signed)
Office Visit Note   Patient: Hailey Vance           Date of Birth: [birth year]           MRN: [id number] Visit Date: 09/11/2020              Requested by: Debbrah Alar, NP [address] [place],  [place] [address] PCP: Debbrah Alar, NP   Assessment & Plan: Visit Diagnoses:  1. Chronic left shoulder pain   2. Primary osteoarthritis, left shoulder     Plan: Hailey Vance developed insidious onset of left shoulder pain after her recent COVID-vaccine injection.  She has had some difficulty raising her arm over her head.  X-rays reveal what appears to be primary osteoarthritis with a small inferior humeral head spur.  After much discussion she like to proceed with an intra-articular cortisone injection.  She is aware that this will will help but will be temporary.  We will plan to see her back as needed.  She did not have much loss of motion but I have encouraged her to keep working on range of motion and strengthening.  I did not discuss definitive treatment of replacement as I do not think she is anywhere near that need at present Follow-Up Instructions: No follow-ups on file.   Orders:  Orders Placed This Encounter  Procedures   Large Joint Inj: L glenohumeral   XR Shoulder Left   No orders of the defined types were placed in this encounter.     Procedures: Large Joint Inj: L glenohumeral on 09/11/2020 1:49 PM Details: 25 G 1.5 in needle, posterior approach  Arthrogram: No  Medications: 2 mL lidocaine 1 %; 80 mg methylPREDNISolone acetate 40 MG/ML; 2 mL bupivacaine 0.25 % Procedure, treatment alternatives, risks and benefits explained, specific risks discussed. Consent was given by the patient. Immediately prior to procedure a time out was called to verify the correct patient, procedure, equipment, support staff and site/side marked as required. Patient was prepped and draped in the usual sterile fashion.      Clinical Data: No additional  findings.   Subjective: Chief Complaint  Patient presents with   Left Shoulder - Pain  Patient presents today for left shoulder pain. She said that it started about three weeks ago after getting her covid vaccine. She has pain in her proximal arm and hurts constantly. She cannot take anything for pain relief. No numbness or tingling down her arm. She is right hand dominant.  Has history of some renal compromise and is unable to take NSAIDs.  Tylenol has not been working.  No numbness or tingling.  No related neck pain  HPI  Review of Systems   Objective: Vital Signs: Ht 5\' 4"  (1.626 m)   Wt (!) 310 lb (140.6 kg)   LMP 10/02/2007   BMI 53.21 kg/m   Physical Exam Constitutional:      Appearance: She is well-developed.  Eyes:     Pupils: Pupils are equal, round, and reactive to light.  Pulmonary:     Effort: Pulmonary effort is normal.  Skin:    General: Skin is warm and dry.  Neurological:     Mental Status: She is alert and oriented to person, place, and time.  Psychiatric:        Behavior: Behavior normal.    Ortho Exam left shoulder with pain in the impingement position without any grating.  Maybe a little loss of external rotation compared to the  right side.  Good strength.  Abducts about 80 degrees at which point there was some pain.  Good grip and release.  After intra-articular cortisone injection of the left shoulder she had much better range of motion and less discomfort  Specialty Comments:  No specialty comments available.  Imaging: XR Shoulder Left  Result Date: 09/11/2020 Films of the left shoulder obtained in 3 projections.  There is a small inferior humeral head osteophyte consistent with osteoarthritis.  There is minimal narrowing of the glenohumeral joint.  Humeral head is centered about the glenoid.  Normal space between the humeral head and the acromion.  Mild arthritic changes at the [facility] joint.  No ectopic calcification    PMFS History: Patient Active  Problem List   Diagnosis Date Noted   Primary osteoarthritis, left shoulder 09/11/2020   Dysphagia 08/23/2020   B12 deficiency 08/22/2020   Hyperlipidemia 08/22/2020   Moderate episode of recurrent major depressive disorder ([place]) 08/22/2020   Lumbar radiculopathy 08/22/2020   Major depressive disorder    Low back pain 06/15/2019   Osteoarthritis of both hips 06/15/2019   Morbid obesity ([place]) 02/15/2018   Lipoma of left shoulder 12/02/2016   BMI 50.0-59.9, adult ([place])    Essential hypertension 09/06/2014   Osteoarthritis 09/06/2014   Post-menopausal bleeding 09/06/2014   OSA (obstructive sleep apnea) 09/06/2014   Generalized anxiety disorder 09/06/2014   Vitamin D deficiency 09/06/2014   Chronic back pain    Past Medical History:  Diagnosis Date   Bilateral primary osteoarthritis of knee 03/29/2019   Chronic back pain    Cystocele with rectocele    Generalized anxiety disorder    GERD (gastroesophageal reflux disease)    Headache    otc med prn   Heart murmur    echo in epic 11-26-2017  normal w/ ef 55-60%   History of chest pain    pt referred for atyical chest pain , cardiology evaluation w/ dr berry note in epic 10-25-2014, stated with low risk w/ no ischemia , nuclear ef 70% , done 09-19-2014 in epic,  non-cardiac chest pain   HTN (hypertension)    followed by pcp    (normal nuclear stress test 09-19-2014 in epic)   Low blood potassium    Major depressive disorder    OSA (obstructive sleep apnea)    04/2015 study in e- Severe OSA (AHI = 55.0/hour);   (10-06-2019  per pt has not used cpap since 2019 after gastric bypass)   Osteoarthritis of both hips 06/15/2019   Plantar fasciitis of left foot    S/P gastric bypass 02/15/2018   sleeve   SUI (stress urinary incontinence, female)    Vitamin D deficiency 09/06/2014   Wears glasses     Family History  Problem Relation Age of Onset   Lung cancer Mother 70       dec   Cancer Mother    Hypertension Mother    Arthritis  Mother    Cancer Father 20       prostate   Hypertension Father    Leukemia Sister    Hypertension Sister    Arthritis Sister    Hyperlipidemia Sister    Hypertension Brother    Arthritis Brother    Multiple sclerosis Sister    Hypertension Sister    Hypertension Sister    Hypertension Brother    Hypertension Brother    Breast cancer Paternal Aunt    Alzheimer's disease Paternal Aunt    Diabetes Maternal Grandmother  Stroke Maternal Grandmother    Heart attack Paternal Grandmother    Asthma Other     Past Surgical History:  Procedure Laterality Date   BLADDER SUSPENSION N/A 10/12/2019   Procedure: TRANSVAGINAL TAPE (TVT) PROCEDURE;  Surgeon: Bobbye Charleston, MD;  Location: [place];  Service: Gynecology;  Laterality: N/A;   CESAREAN SECTION W/BTL  1990   COLONOSCOPY WITH PROPOFOL N/A 08/30/2015   Procedure: COLONOSCOPY WITH PROPOFOL;  Surgeon: Mauri Pole, MD;  [facility];  Service: Endoscopy;  Laterality: N/A;   CYSTOCELE REPAIR N/A 10/12/2019   Procedure: ANTERIOR (CYSTOCELE);  Surgeon: Bobbye Charleston, MD;  Location: [facility];  Service: Gynecology;  Laterality: N/A;   CYSTOSCOPY N/A 05/23/2015   Procedure: CYSTOSCOPY;  Surgeon: Bobbye Charleston, MD;  Location: [facility];  Service: Gynecology;  Laterality: N/A;   CYSTOSCOPY N/A 10/12/2019   Procedure: CYSTOSCOPY;  Surgeon: Bobbye Charleston, MD;  Location: [facility];  Service: Gynecology;  Laterality: N/A;   HIATAL HERNIA REPAIR N/A 02/15/2018   Procedure: HERNIA REPAIR HIATAL;  Surgeon: Excell Seltzer, MD;  Location: [facility];  Service: General;  Laterality: N/A;   HYSTEROSCOPY WITH D & C N/A 01/11/2015   Procedure: DILATATION AND CURETTAGE /HYSTEROSCOPY;  Surgeon: Jerelyn Charles, MD;  Location: [facility];  Service: Gynecology;  Laterality: N/A;   KNEE ARTHROSCOPY Right 2010   LAPAROSCOPIC GASTRIC SLEEVE RESECTION N/A 02/15/2018   Procedure: LAPAROSCOPIC  GASTRIC SLEEVE RESECTION WITH UPPER ENDO AND ERAS PATHWAY;  Surgeon: Excell Seltzer, MD;  Location: [facility];  Service: General;  Laterality: N/A;   LAPAROSCOPIC OOPHORECTOMY Bilateral 2004   LEFT OOPHORECTOMY AND PARTIAL RIGHT OOPHORECTOMY   LIPOMA EXCISION Left 12/02/2016   Procedure: EXCISION LIPOMA OF LEFT SHOULDER;  Surgeon: Garald Balding, MD;  Location: [place];  Service: Orthopedics;  Laterality: Left;   ROBOTIC ASSISTED TOTAL HYSTERECTOMY WITH SALPINGECTOMY Bilateral 05/23/2015   Procedure: ROBOTIC ASSISTED TOTAL HYSTERECTOMY WITH SALPINGECTOMYwith right oophorectomy;  Surgeon: Bobbye Charleston, MD;  Location: [facility];  Service: Gynecology;  Laterality: Bilateral;   UMBILICAL HERNIA REPAIR  2002   Social History   Occupational History   Occupation: Disability  Tobacco Use   Smoking status: Never   Smokeless tobacco: Never  Vaping Use   Vaping Use: Never used  Substance and Sexual Activity   Alcohol use: No    Alcohol/week: 0.0 standard drinks   Drug use: Never   Sexual activity: Not on file    Comment: BTL w/ C/S in 1990

## 2020-09-11 NOTE — Progress Notes (Signed)
Subjective:   By signing my name below, I, Hailey Vance, attest that this documentation has been prepared under the direction and in the presence of Hailey Alar NP. 09/11/2020     Patient ID: Hailey Vance, female    DOB: 05-25-1965, 55 y.o.   MRN: [id number]  Chief Complaint  Patient presents with   Arm Pain    Arm pain after covid shot, left arm, 3 weeks ago, no known swelling    HPI Patient is in today for a office visit. She complains of left arm pain since getting a Covid-19 vaccine since 3 weeks ago. While getting the vaccine she was observed and reacted normally and had no pain. The next morning she had pain in her arm and managed it by putting ice and rubbing alcohol on her arm but found no relief. Since then her pain has worsened. She reports not being able to lift her arm without having pain. She also has pain while pushing and pulling objects which make it difficult to use her walker. She has pain while putting on clothes. She does not know if the site of the vaccine has redness or bruising due to the site of the vaccine being in a difficult area to observe for her. She denies any fever at this time.  Health Maintenance Due  Topic Date Due   Zoster Vaccines- Shingrix (1 of 2) Never done   PAP SMEAR-Modifier  10/01/2017   COVID-19 Vaccine (2 - Pfizer series) 09/12/2020    Past Medical History:  Diagnosis Date   Bilateral primary osteoarthritis of knee 03/29/2019   Chronic back pain    Cystocele with rectocele    Generalized anxiety disorder    GERD (gastroesophageal reflux disease)    Headache    otc med prn   Heart murmur    echo in epic 11-26-2017  normal w/ ef 55-60%   History of chest pain    pt referred for atyical chest pain , cardiology evaluation w/ dr berry note in epic 10-25-2014, stated with low risk w/ no ischemia , nuclear ef 70% , done 09-19-2014 in epic,  non-cardiac chest pain   HTN (hypertension)    followed by pcp    (normal nuclear  stress test 09-19-2014 in epic)   Low blood potassium    Major depressive disorder    OSA (obstructive sleep apnea)    04/2015 study in e- Severe OSA (AHI = 55.0/hour);   (10-06-2019  per pt has not used cpap since 2019 after gastric bypass)   Osteoarthritis of both hips 06/15/2019   Plantar fasciitis of left foot    S/P gastric bypass 02/15/2018   sleeve   SUI (stress urinary incontinence, female)    Vitamin D deficiency 09/06/2014   Wears glasses     Past Surgical History:  Procedure Laterality Date   BLADDER SUSPENSION N/A 10/12/2019   Procedure: TRANSVAGINAL TAPE (TVT) PROCEDURE;  Surgeon: Bobbye Charleston, MD;  Location: [place];  Service: Gynecology;  Laterality: N/A;   CESAREAN SECTION W/BTL  1990   COLONOSCOPY WITH PROPOFOL N/A 08/30/2015   Procedure: COLONOSCOPY WITH PROPOFOL;  Surgeon: Mauri Pole, MD;  [facility];  Service: Endoscopy;  Laterality: N/A;   CYSTOCELE REPAIR N/A 10/12/2019   Procedure: ANTERIOR (CYSTOCELE);  Surgeon: Bobbye Charleston, MD;  Location: [facility];  Service: Gynecology;  Laterality: N/A;   CYSTOSCOPY N/A 05/23/2015   Procedure: CYSTOSCOPY;  Surgeon: Bobbye Charleston, MD;  Location: [facility];  Service: Gynecology;  Laterality: N/A;   CYSTOSCOPY N/A 10/12/2019   Procedure: CYSTOSCOPY;  Surgeon: Bobbye Charleston, MD;  Location: [facility];  Service: Gynecology;  Laterality: N/A;   HIATAL HERNIA REPAIR N/A 02/15/2018   Procedure: HERNIA REPAIR HIATAL;  Surgeon: Excell Seltzer, MD;  Location: [facility];  Service: General;  Laterality: N/A;   HYSTEROSCOPY WITH D & C N/A 01/11/2015   Procedure: DILATATION AND CURETTAGE /HYSTEROSCOPY;  Surgeon: Jerelyn Charles, MD;  Location: [facility];  Service: Gynecology;  Laterality: N/A;   KNEE ARTHROSCOPY Right 2010   LAPAROSCOPIC GASTRIC SLEEVE RESECTION N/A 02/15/2018   Procedure: LAPAROSCOPIC GASTRIC SLEEVE RESECTION WITH UPPER ENDO AND ERAS PATHWAY;   Surgeon: Excell Seltzer, MD;  Location: [facility];  Service: General;  Laterality: N/A;   LAPAROSCOPIC OOPHORECTOMY Bilateral 2004   LEFT OOPHORECTOMY AND PARTIAL RIGHT OOPHORECTOMY   LIPOMA EXCISION Left 12/02/2016   Procedure: EXCISION LIPOMA OF LEFT SHOULDER;  Surgeon: Garald Balding, MD;  Location: [place];  Service: Orthopedics;  Laterality: Left;   ROBOTIC ASSISTED TOTAL HYSTERECTOMY WITH SALPINGECTOMY Bilateral 05/23/2015   Procedure: ROBOTIC ASSISTED TOTAL HYSTERECTOMY WITH SALPINGECTOMYwith right oophorectomy;  Surgeon: Bobbye Charleston, MD;  Location: [facility];  Service: Gynecology;  Laterality: Bilateral;   UMBILICAL HERNIA REPAIR  2002    Family History  Problem Relation Age of Onset   Lung cancer Mother 61       dec   Cancer Mother    Hypertension Mother    Arthritis Mother    Cancer Father 77       prostate   Hypertension Father    Leukemia Sister    Hypertension Sister    Arthritis Sister    Hyperlipidemia Sister    Hypertension Brother    Arthritis Brother    Multiple sclerosis Sister    Hypertension Sister    Hypertension Sister    Hypertension Brother    Hypertension Brother    Breast cancer Paternal Aunt    Alzheimer's disease Paternal Aunt    Diabetes Maternal Grandmother    Stroke Maternal Grandmother    Heart attack Paternal Grandmother    Asthma Other     Social History   Socioeconomic History   Marital status: Divorced    Spouse name: Not on file   Number of children: Not on file   Years of education: 12   Highest education level: High school graduate  Occupational History   Occupation: Disability  Tobacco Use   Smoking status: Never   Smokeless tobacco: Never  Vaping Use   Vaping Use: Never used  Substance and Sexual Activity   Alcohol use: No    Alcohol/week: 0.0 standard drinks   Drug use: Never   Sexual activity: Not on file    Comment: BTL w/ C/S in 1990  Other Topics Concern   Not on file  Social History Narrative   Divorced    Lives with son and grand-daughter   49- Son Hailey Vance (lives in [place])   Hailey Vance lives with patient   44- Daughter Hailey Vance (lives in [place])   Works at [employer]   Completed some college   Careers adviser   Social Determinants of Radio broadcast assistant Strain: Not on [employer] Insecurity: Not on file  Transportation Needs: Not on file  Physical Activity: Not on file  Stress: Not on file  Social Connections: Not on file  Intimate Partner Violence: Not on file  Outpatient Medications Prior to Visit  Medication Sig Dispense Refill   amLODipine (NORVASC) 2.5 MG tablet Take 3 tablets (7.5 mg total) by mouth daily. 90 tablet 3   Artificial Tear Ointment (DRY EYES OP) Apply to eye as needed.     buPROPion (WELLBUTRIN XL) 300 MG 24 hr tablet Take 1 tablet (300 mg total) by mouth daily. 90 tablet 1   Ca Phosphate-Cholecalciferol (CALTRATE GUMMY BITES PO) Take by mouth daily.      celecoxib (CELEBREX) 100 MG capsule Take 1 capsule (100 mg total) by mouth 2 (two) times daily as needed. 60 capsule 1   Cholecalciferol (VITAMIN D3) 50 MCG (2000 UT) CHEW Chew 8,000 Units by mouth daily.     COVID-19 mRNA Vac-TriS, Pfizer, SUSP injection Inject into the muscle. 0.3 mL 0   Cyanocobalamin (B-12) 500 MCG TABS Take 1 tablet by mouth daily. 30 tablet    famotidine (PEPCID) 20 MG tablet Take 1 tablet (20 mg total) by mouth at bedtime.     gabapentin (NEURONTIN) 100 MG capsule Take 1 capsule (100 mg total) by mouth 3 (three) times daily. 90 capsule 3   lisinopril-hydrochlorothiazide (ZESTORETIC) 20-25 MG tablet Take 1 tablet by mouth once daily 90 tablet 1   Menthol, Topical Analgesic, 4 % GEL Apply 1 application topically daily.     metoprolol succinate (TOPROL-XL) 25 MG 24 hr tablet Take 1 tablet by mouth once daily 90 tablet 0   multivitamin-iron-minerals-folic acid (CENTRUM) chewable tablet Chew 1 tablet by mouth daily.     omeprazole (PRILOSEC) 40 MG  capsule Take 1 capsule (40 mg total) by mouth daily. 90 capsule 3   venlafaxine XR (EFFEXOR-XR) 150 MG 24 hr capsule Take 1 capsule (150 mg total) by mouth daily with breakfast. 90 capsule 1   No facility-administered medications prior to visit.    Allergies  Allergen Reactions   Penicillins Itching and Other (See Comments)    Has patient had a PCN reaction causing immediate rash, facial/tongue/throat swelling, SOB or lightheadedness with hypotension: No Has patient had a PCN reaction causing severe rash involving mucus membranes or skin necrosis: No Has patient had a PCN reaction that required hospitalization No Has patient had a PCN reaction occurring within the last 10 years: No If all of the above answers are "NO", then may proceed with Cephalosporin use.     Review of Systems  Constitutional:  Negative for fever.  Musculoskeletal:  Positive for myalgias (Left arm).      Objective:    Physical Exam Constitutional:      General: She is not in acute distress.    Appearance: Normal appearance. She is not ill-appearing.  HENT:     Head: Normocephalic and atraumatic.     Right Ear: External ear normal.     Left Ear: External ear normal.  Eyes:     Extraocular Movements: Extraocular movements intact.     Pupils: Pupils are equal, round, and reactive to light.  Cardiovascular:     Rate and Rhythm: Normal rate and regular rhythm.     Pulses: Normal pulses.     Heart sounds: Normal heart sounds. No murmur heard.   No gallop.  Pulmonary:     Effort: Pulmonary effort is normal. No respiratory distress.     Breath sounds: Normal breath sounds. No wheezing, rhonchi or rales.  Musculoskeletal:     Comments: Positive pain with empty can on left. Positive pain on left shoulder with abduction  Skin:  General: Skin is warm and dry.  Neurological:     Mental Status: She is alert and oriented to person, place, and time.  Psychiatric:        Behavior: Behavior normal.    BP  126/84 (BP Location: Right Arm, Patient Position: Sitting, Cuff Size: Large)   Pulse 87   Temp (!) 97.4 F (36.3 C)   Resp 17   LMP 10/02/2007   SpO2 99%  Wt Readings from Last 3 Encounters:  09/11/20 (!) 310 lb (140.6 kg)  08/22/20 (!) 310 lb (140.6 kg)  08/22/20 (!) 310 lb (140.6 kg)       Assessment & Plan:   Problem List Items Addressed This Visit       Unprioritized   Left shoulder pain - Primary    I don't think this is related to her covid-19 shot.  Avoid NSAIDS due to renal insufficiency. Will refer to ortho for further evaluation.        Relevant Orders   Ambulatory referral to Orthopedics     No orders of the defined types were placed in this encounter.   I, Hailey Alar NP, personally preformed the services described in this documentation.  All medical record entries made by the scribe were at my direction and in my presence.  I have reviewed the chart and discharge instructions (if applicable) and agree that the record reflects my personal performance and is accurate and complete. 09/11/2020   I,Hailey Vance,acting as a Education administrator for Nance Pear, NP.,have documented all relevant documentation on the behalf of Nance Pear, NP,as directed by  Nance Pear, NP while in the presence of Nance Pear, NP.   Nance Pear, NP

## 2020-09-11 NOTE — Patient Instructions (Signed)
You may use tylenol for pain.  You should be contacted about your referral to Dr. Durward Fortes.

## 2020-09-12 DIAGNOSIS — M25512 Pain in left shoulder: Secondary | ICD-10-CM | POA: Insufficient documentation

## 2020-09-12 NOTE — Assessment & Plan Note (Signed)
I don't think this is related to her covid-19 shot.  Avoid NSAIDS due to renal insufficiency. Will refer to ortho for further evaluation.

## 2020-09-21 ENCOUNTER — Ambulatory Visit: Payer: Self-pay | Admitting: Podiatry

## 2020-10-02 ENCOUNTER — Encounter: Payer: Medicare Other | Admitting: Family

## 2020-10-18 DIAGNOSIS — G4733 Obstructive sleep apnea (adult) (pediatric): Secondary | ICD-10-CM | POA: Diagnosis not present

## 2020-10-19 ENCOUNTER — Other Ambulatory Visit: Payer: Self-pay | Admitting: Family

## 2020-10-24 ENCOUNTER — Encounter: Payer: Self-pay | Admitting: Family

## 2020-10-24 DIAGNOSIS — M25512 Pain in left shoulder: Secondary | ICD-10-CM

## 2020-11-02 NOTE — Telephone Encounter (Signed)
-----   Message from Antonieta Loveless sent at 11/01/2020  9:01 AM EDT ----- Regarding: Unable to reach patient Hi Dr. Inda Castle  We have attempted to contact patient on: 10/25/20 10/26/20 10/29/20 11/01/20 Voicemail box is full and unable to leave messages.   Just wanted to let you know.   Thanks [employer] [facility].

## 2020-11-07 ENCOUNTER — Telehealth: Payer: Self-pay

## 2020-11-07 ENCOUNTER — Ambulatory Visit: Payer: Medicare Other

## 2020-11-07 NOTE — Telephone Encounter (Signed)
Attempted to reach patient x 3 for 11:00 appt for Medicare Wellness exam. Left message for patient to call back to reschedule.

## 2020-11-07 NOTE — Progress Notes (Deleted)
Subjective:   Hailey Vance is a 55 y.o. female who presents for an Initial Medicare Annual Wellness Visit.  I connected with *** today by telephone and verified that I am speaking with the correct person using two identifiers. Location patient: home Location provider: work Persons participating in the virtual visit: patient, Marine scientist.    I discussed the limitations, risks, security and privacy concerns of performing an evaluation and management service by telephone and the availability of in person appointments. I also discussed with the patient that there may be a patient responsible charge related to this service. The patient expressed understanding and verbally consented to this telephonic visit.    Interactive audio and video telecommunications were attempted between this provider and patient, however failed, due to patient having technical difficulties OR patient did not have access to video capability.  We continued and completed visit with audio only.  Some vital signs may be absent or patient reported.   Time Spent with patient on telephone encounter: *** minutes   Review of Systems    ***       Objective:    There were no vitals filed for this visit. There is no height or weight on file to calculate BMI.  Advanced Directives 10/12/2019 02/15/2018 02/15/2018 02/11/2018 09/30/2017 12/01/2016 10/12/2016  Does Patient Have a Medical Advance Directive? No No No No No No No  Would patient like information on creating a medical advance directive? Yes (Inpatient - patient defers creating a medical advance directive at this time - Information given) No - Patient declined No - Patient declined No - Patient declined Yes (MAU/Ambulatory/Procedural Areas - Information given) No - Patient declined No - Patient declined    Current Medications (verified) Outpatient Encounter Medications as of 11/07/2020  Medication Sig   amLODipine (NORVASC) 2.5 MG tablet Take 3 tablets (7.5 mg total) by  mouth daily.   Artificial Tear Ointment (DRY EYES OP) Apply to eye as needed.   buPROPion (WELLBUTRIN XL) 300 MG 24 hr tablet TAKE 1 TABLET BY MOUTH EVERY DAY   Ca Phosphate-Cholecalciferol (CALTRATE GUMMY BITES PO) Take by mouth daily.    celecoxib (CELEBREX) 100 MG capsule Take 1 capsule (100 mg total) by mouth 2 (two) times daily as needed.   Cholecalciferol (VITAMIN D3) 50 MCG (2000 UT) CHEW Chew 8,000 Units by mouth daily.   COVID-19 mRNA Vac-TriS, Pfizer, SUSP injection Inject into the muscle.   Cyanocobalamin (B-12) 500 MCG TABS Take 1 tablet by mouth daily.   famotidine (PEPCID) 20 MG tablet Take 1 tablet (20 mg total) by mouth at bedtime.   gabapentin (NEURONTIN) 100 MG capsule Take 1 capsule (100 mg total) by mouth 3 (three) times daily.   lisinopril-hydrochlorothiazide (ZESTORETIC) 20-25 MG tablet Take 1 tablet by mouth once daily   Menthol, Topical Analgesic, 4 % GEL Apply 1 application topically daily.   metoprolol succinate (TOPROL-XL) 25 MG 24 hr tablet Take 1 tablet by mouth once daily   multivitamin-iron-minerals-folic acid (CENTRUM) chewable tablet Chew 1 tablet by mouth daily.   omeprazole (PRILOSEC) 40 MG capsule Take 1 capsule (40 mg total) by mouth daily.   venlafaxine XR (EFFEXOR-XR) 150 MG 24 hr capsule TAKE 1 CAPSULE BY MOUTH DAILY WITH BREAKFAST.   [DISCONTINUED] LISINOPRIL PO Take by mouth.   No facility-administered encounter medications on file as of 11/07/2020.    Allergies (verified) Penicillins   History: Past Medical History:  Diagnosis Date   Bilateral primary osteoarthritis of knee 03/29/2019   Chronic  back pain    Cystocele with rectocele    Generalized anxiety disorder    GERD (gastroesophageal reflux disease)    Headache    otc med prn   Heart murmur    echo in epic 11-26-2017  normal w/ ef 55-60%   History of chest pain    pt referred for atyical chest pain , cardiology evaluation w/ dr berry note in epic 10-25-2014, stated with low risk w/ no  ischemia , nuclear ef 70% , done 09-19-2014 in epic,  non-cardiac chest pain   HTN (hypertension)    followed by pcp    (normal nuclear stress test 09-19-2014 in epic)   Low blood potassium    Major depressive disorder    OSA (obstructive sleep apnea)    04/2015 study in e- Severe OSA (AHI = 55.0/hour);   (10-06-2019  per pt has not used cpap since 2019 after gastric bypass)   Osteoarthritis of both hips 06/15/2019   Plantar fasciitis of left foot    S/P gastric bypass 02/15/2018   sleeve   SUI (stress urinary incontinence, female)    Vitamin D deficiency 09/06/2014   Wears glasses    Past Surgical History:  Procedure Laterality Date   BLADDER SUSPENSION N/A 10/12/2019   Procedure: TRANSVAGINAL TAPE (TVT) PROCEDURE;  Surgeon: Bobbye Charleston, MD;  Location: [facility];  Service: Gynecology;  Laterality: N/A;   CESAREAN SECTION W/BTL  1990   COLONOSCOPY WITH PROPOFOL N/A 08/30/2015   Procedure: COLONOSCOPY WITH PROPOFOL;  Surgeon: Mauri Pole, MD;  Location: [facility];  Service: Endoscopy;  Laterality: N/A;   CYSTOCELE REPAIR N/A 10/12/2019   Procedure: ANTERIOR (CYSTOCELE);  Surgeon: Bobbye Charleston, MD;  Location[facility];  Service: Gynecology;  Laterality: N/A;   CYSTOSCOPY N/A 05/23/2015   Procedure: CYSTOSCOPY;  Surgeon: Bobbye Charleston, MD;  Location: [facility];  Service: Gynecology;  Laterality: N/A;   CYSTOSCOPY N/A 10/12/2019   Procedure: CYSTOSCOPY;  Surgeon: Bobbye Charleston, MD;  Location: [facility];  Service: Gynecology;  Laterality: N/A;   HIATAL HERNIA REPAIR N/A 02/15/2018   Procedure: HERNIA REPAIR HIATAL;  Surgeon: Excell Seltzer, MD;  Location: [facility];  Service: General;  Laterality: N/A;   HYSTEROSCOPY WITH D & C N/A 01/11/2015   Procedure: DILATATION AND CURETTAGE /HYSTEROSCOPY;  Surgeon: Jerelyn Charles, MD;  Location: [facility];  Service: Gynecology;  Laterality: N/A;   KNEE ARTHROSCOPY Right 2010    LAPAROSCOPIC GASTRIC SLEEVE RESECTION N/A 02/15/2018   Procedure: LAPAROSCOPIC GASTRIC SLEEVE RESECTION WITH UPPER ENDO AND ERAS PATHWAY;  Surgeon: Excell Seltzer, MD;  Location: [facility];  Service: General;  Laterality: N/A;   LAPAROSCOPIC OOPHORECTOMY Bilateral 2004   LEFT OOPHORECTOMY AND PARTIAL RIGHT OOPHORECTOMY   LIPOMA EXCISION Left 12/02/2016   Procedure: EXCISION LIPOMA OF LEFT SHOULDER;  Surgeon: Garald Balding, MD;  Location: [facility];  Service: Orthopedics;  Laterality: Left;   ROBOTIC ASSISTED TOTAL HYSTERECTOMY WITH SALPINGECTOMY Bilateral 05/23/2015   Procedure: ROBOTIC ASSISTED TOTAL HYSTERECTOMY WITH SALPINGECTOMYwith right oophorectomy;  Surgeon: Bobbye Charleston, MD;  Location: [facility];  Service: Gynecology;  Laterality: Bilateral;   UMBILICAL HERNIA REPAIR  2002   Family History  Problem Relation Age of Onset   Lung cancer Mother 76       dec   Cancer Mother    Hypertension Mother    Arthritis Mother    Cancer Father 66       prostate   Hypertension Father  Leukemia Sister    Hypertension Sister    Arthritis Sister    Hyperlipidemia Sister    Hypertension Brother    Arthritis Brother    Multiple sclerosis Sister    Hypertension Sister    Hypertension Sister    Hypertension Brother    Hypertension Brother    Breast cancer Paternal Aunt    Alzheimer's disease Paternal Aunt    Diabetes Maternal Grandmother    Stroke Maternal Grandmother    Heart attack Paternal Grandmother    Asthma Other    Social History   Socioeconomic History   Marital status: Divorced    Spouse name: Not on file   Number of children: Not on file   Years of education: 12   Highest education level: High school graduate  Occupational History   Occupation: Disability  Tobacco Use   Smoking status: Never   Smokeless tobacco: Never  Vaping Use   Vaping Use: Never used  Substance and Sexual Activity   Alcohol use: No    Alcohol/week: 0.0 standard drinks   Drug use: Never    Sexual activity: Not on file    Comment: BTL w/ C/S in 1990  Other Topics Concern   Not on file  Social History Narrative   Divorced   Lives with son and grand-daughter   35- Son [name] (lives in [place])   [place] lives with patient   62- Daughter [name] (lives in [place])   Works at [employer]   Completed some college   Careers adviser   Social Determinants of Radio broadcast assistant Strain: Not on [employer] Insecurity: Not on file  Transportation Needs: Not on file  Physical Activity: Not on file  Stress: Not on file  Social Connections: Not on file    Tobacco Counseling Counseling given: Not Answered   Clinical Intake:                 Diabetic?No         Activities of Daily Living No flowsheet data found.  Patient Care Team: Debbrah Alar, NP as PCP - General (Internal Medicine) Kary Kos, MD as Consulting Physician (Neurosurgery) Yisroel Ramming, Everardo All, MD as Consulting Physician (Obstetrics and Gynecology)  Indicate any recent Medical Services you may have received from other than [facility] providers in the past year (date may be approximate).     Assessment:   This is a routine wellness examination for Caiya.  Hearing/Vision screen No results found.  Dietary issues and exercise activities discussed:     Goals Addressed   None    Depression Screen PHQ 2/9 Scores 08/22/2020 03/20/2020 05/03/2019 10/19/2018 03/02/2018 01/26/2018 12/28/2017  PHQ - 2 Score 3 0 4 6 0 0 0  PHQ- 9 Score 12 0 13 20 - - -    Fall Risk Fall Risk  08/22/2020 03/02/2018 01/26/2018 12/28/2017 11/26/2017  Falls in the past year? 1 0 0 No No  Number falls in past yr: 0 - - - -  Injury with Fall? 1 - - - -    FALL RISK PREVENTION PERTAINING TO THE HOME:  Any stairs in or around the home? {YES/NO:21197} If so, are there any without handrails? {YES/NO:21197} Home free of loose throw rugs in walkways, pet beds,  electrical cords, etc? {YES/NO:21197} Adequate lighting in your home to reduce risk of falls? {YES/NO:21197}  ASSISTIVE DEVICES UTILIZED TO PREVENT FALLS:  Life alert? {YES/NO:21197} Use of a cane, walker  or w/c? {YES/NO:21197} Grab bars in the bathroom? {YES/NO:21197} Shower chair or bench in shower? {YES/NO:21197} Elevated toilet seat or a handicapped toilet? {YES/NO:21197}  TIMED UP AND GO:  Was the test performed? {YES/NO:21197}.  Length of time to ambulate 10 feet: *** sec.   {Appearance of GA:4730917  Cognitive Function:        Immunizations Immunization History  Administered Date(s) Administered   Influenza-Unspecified 12/28/2014   PFIZER Comirnaty(Gray Top)Covid-19 Tri-Sucrose Vaccine 08/22/2020   Tdap 10/10/2014    TDAP status: Up to date  Flu Vaccine status: Due, Education has been provided regarding the importance of this vaccine. Advised may receive this vaccine at local pharmacy or Health Dept. Aware to provide a copy of the vaccination record if obtained from local pharmacy or Health Dept. Verbalized acceptance and understanding.  Pneumococcal vaccine status: Not yet indicated  {Covid-19 vaccine status:2101808}  Qualifies for Shingles Vaccine? Yes   Zostavax completed No   Shingrix Completed?: No.    Education has been provided regarding the importance of this vaccine. Patient has been advised to call insurance company to determine out of pocket expense if they have not yet received this vaccine. Advised may also receive vaccine at local pharmacy or Health Dept. Verbalized acceptance and understanding.  Screening Tests Health Maintenance  Topic Date Due   Zoster Vaccines- Shingrix (1 of 2) Never done   PAP SMEAR-Modifier  10/01/2017   COVID-19 Vaccine (2 - Pfizer series) 09/12/2020   INFLUENZA VACCINE  10/01/2020   MAMMOGRAM  04/28/2021   TETANUS/TDAP  10/09/2024   COLONOSCOPY (Pts 45-9yr Insurance coverage will need to be confirmed)  08/29/2025    Hepatitis C Screening  Completed   HIV Screening  Completed   Pneumococcal Vaccine 030631Years old  Aged Out   HPV VTamaracMaintenance Due  Topic Date Due   Zoster Vaccines- Shingrix (1 of 2) Never done   PAP SMEAR-Modifier  10/01/2017   COVID-19 Vaccine (2 - Pfizer series) 09/12/2020   INFLUENZA VACCINE  10/01/2020    Colorectal cancer screening: Type of screening: Colonoscopy. Completed 08/30/2015. Repeat every 10 years  {Mammogram status:21018020}  Bone Density status: Not yet indicated  Lung Cancer Screening: (Low Dose CT Chest recommended if Age 57-80years, 30 pack-year currently smoking OR have quit w/in 15years.) does not qualify.     Additional Screening:  Hepatitis C Screening: Completed 05/02/2015  Vision Screening: Recommended annual ophthalmology exams for early detection of glaucoma and other disorders of the eye. Is the patient up to date with their annual eye exam?  {YES/NO:21197} Who is the provider or what is the name of the office in which the patient attends annual eye exams? *** If pt is not established with a provider, would they like to be referred to a provider to establish care? {YES/NO:21197}.   Dental Screening: Recommended annual dental exams for proper oral hygiene  Community Resource Referral / Chronic Care Management: CRR required this visit?  {YES/NO:21197}  CCM required this visit?  {YES/NO:21197}     Plan:     I have personally reviewed and noted the following in the patient's chart:   Medical and social history Use of alcohol, tobacco or illicit drugs  Current medications and supplements including opioid prescriptions. {Opioid Prescriptions:[phone]} Functional ability and status Nutritional status Physical activity Advanced directives List of other physicians Hospitalizations, surgeries, and ER visits in previous 12 months Vitals Screenings to include cognitive, depression, and  falls Referrals  and appointments  In addition, I have reviewed and discussed with patient certain preventive protocols, quality metrics, and best practice recommendations. A written personalized care plan for preventive services as well as general preventive health recommendations were provided to patient.   Due to this being a telephonic visit, the after visit summary with patients personalized plan was offered to patient via mail or my-chart. ***Patient declined at this time./ Patient would like to access on my-chart/ per request, patient was mailed a copy of AVS./ Patient preferred to pick up at office at next visit.   Marta Antu, LPN   [id number]  Nurse Health Advisor  Nurse Notes: ***

## 2020-11-18 DIAGNOSIS — G4733 Obstructive sleep apnea (adult) (pediatric): Secondary | ICD-10-CM | POA: Diagnosis not present

## 2020-11-23 DIAGNOSIS — G4733 Obstructive sleep apnea (adult) (pediatric): Secondary | ICD-10-CM | POA: Diagnosis not present

## 2020-11-28 ENCOUNTER — Encounter: Payer: Self-pay | Admitting: Orthopaedic Surgery

## 2020-11-28 ENCOUNTER — Ambulatory Visit (INDEPENDENT_AMBULATORY_CARE_PROVIDER_SITE_OTHER): Payer: Medicare Other | Admitting: Orthopaedic Surgery

## 2020-11-28 ENCOUNTER — Other Ambulatory Visit: Payer: Self-pay

## 2020-11-28 ENCOUNTER — Ambulatory Visit (INDEPENDENT_AMBULATORY_CARE_PROVIDER_SITE_OTHER): Payer: Medicare Other | Admitting: Family

## 2020-11-28 VITALS — BP 147/86 | HR 74 | Temp 98.0°F | Resp 16 | Ht 64.0 in | Wt 312.0 lb

## 2020-11-28 DIAGNOSIS — M25512 Pain in left shoulder: Secondary | ICD-10-CM | POA: Diagnosis not present

## 2020-11-28 DIAGNOSIS — M5416 Radiculopathy, lumbar region: Secondary | ICD-10-CM | POA: Diagnosis not present

## 2020-11-28 DIAGNOSIS — M170 Bilateral primary osteoarthritis of knee: Secondary | ICD-10-CM | POA: Diagnosis not present

## 2020-11-28 DIAGNOSIS — I10 Essential (primary) hypertension: Secondary | ICD-10-CM | POA: Diagnosis not present

## 2020-11-28 DIAGNOSIS — F411 Generalized anxiety disorder: Secondary | ICD-10-CM | POA: Diagnosis not present

## 2020-11-28 LAB — BASIC METABOLIC PANEL
BUN: 17 mg/dL (ref 6–23)
CO2: 30 mEq/L (ref 19–32)
Calcium: 9.1 mg/dL (ref 8.4–10.5)
Chloride: 103 mEq/L (ref 96–112)
Creatinine, Ser: 1.01 mg/dL (ref 0.40–1.20)
GFR: 62.11 mL/min (ref >60.00)
Glucose, Bld: 85 mg/dL (ref 70–99)
Potassium: 4.1 mEq/L (ref 3.5–5.1)
Sodium: 140 mEq/L (ref 135–145)

## 2020-11-28 MED ORDER — LIDOCAINE HCL 1 % IJ SOLN
2.0000 mL | INTRAMUSCULAR | Status: AC | PRN
  Administered 2020-11-28: 2 mL

## 2020-11-28 MED ORDER — BUPIVACAINE HCL 0.25 % IJ SOLN
2.0000 mL | INTRAMUSCULAR | Status: AC | PRN
  Administered 2020-11-28: 2 mL via INTRA_ARTICULAR

## 2020-11-28 MED ORDER — LIDOCAINE HCL 1 % IJ SOLN
2.0000 mL | INTRAMUSCULAR | Status: AC | PRN
Start: 2020-11-28 — End: 2020-11-28
  Administered 2020-11-28: 2 mL

## 2020-11-28 MED ORDER — GABAPENTIN 300 MG PO CAPS: 300.0000 mg | ORAL_CAPSULE | Freq: Three times a day (TID) | ORAL | 3 refills | Status: DC

## 2020-11-28 MED ORDER — METHYLPREDNISOLONE ACETATE 40 MG/ML IJ SUSP
80.0000 mg | INTRAMUSCULAR | Status: AC | PRN
  Administered 2020-11-28: 80 mg via INTRA_ARTICULAR

## 2020-11-28 NOTE — Progress Notes (Signed)
Office Visit Note   Patient: Hailey Vance           Date of Birth: 01-25-1966           MRN: [id number] Visit Date: 11/28/2020              Requested by: Debbrah Alar, NP [address] [place],  [place] [address] PCP: Debbrah Alar, NP   Assessment & Plan: Visit Diagnoses:  1. Bilateral primary osteoarthritis of knee   2. Morbid obesity ([place])     Plan: Recurrent symptoms of osteoarthritis both knees.  Has very large knees.  I injected them with Depo-Medrol.  Patient has a history of morbid obesity and has had a prior gastric bypass  Follow-Up Instructions: Return if symptoms worsen or fail to improve.   Orders:  No orders of the defined types were placed in this encounter.  No orders of the defined types were placed in this encounter.     Procedures: Large Joint Inj: bilateral knee on 11/28/2020 11:58 AM Indications: diagnostic evaluation Details: 25 G 1.5 in needle, anteromedial approach  Arthrogram: No  Medications (Right): 80 mg methylPREDNISolone acetate 40 MG/ML; 2 mL lidocaine 1 %; 2 mL bupivacaine 0.25 % Medications (Left): 2 mL lidocaine 1 %; 80 mg methylPREDNISolone acetate 40 MG/ML; 2 mL bupivacaine 0.25 % Consent was given by the patient. Immediately prior to procedure a time out was called to verify the correct patient, procedure, equipment, support staff and site/side marked as required. Patient was prepped and draped in the usual sterile fashion.      Clinical Data: No additional findings.   Subjective: Chief Complaint  Patient presents with   Right Knee - Pain   Left Knee - Pain  Patient presents today for bilateral knee pain. She received cortisone injections in June of this year. The injections usually help for a couple months and then the pain returns. She said that her left knee is worse than the right. She is wanting to have both knees injected today.  HPI  Review of Systems   Objective: Vital Signs: LMP  10/02/2007   Physical Exam Constitutional:      Appearance: She is well-developed.  Pulmonary:     Effort: Pulmonary effort is normal.  Skin:    General: Skin is warm and dry.  Neurological:     Mental Status: She is alert and oriented to person, place, and time.  Psychiatric:        Behavior: Behavior normal.    Ortho Exam awake alert and oriented x3.  Comfortable sitting.  Very large legs.  BMI is 54.  Walks with the use of a rolling walker.  Full extension of both knees only about 90 degrees of flexion based on calf touching thigh.  Mostly lateral joint pain.  Some patella crepitation.  Difficult to determine if there is an effusion based on the sides of the knees  Specialty Comments:  No specialty comments available.  Imaging: No results found.   PMFS History: Patient Active Problem List   Diagnosis Date Noted   Left shoulder pain 09/12/2020   Primary osteoarthritis, left shoulder 09/11/2020   Dysphagia 08/23/2020   B12 deficiency 08/22/2020   Hyperlipidemia 08/22/2020   Moderate episode of recurrent major depressive disorder ([place]) 08/22/2020   Lumbar radiculopathy 08/22/2020   Major depressive disorder    Low back pain 06/15/2019   Osteoarthritis of both hips 06/15/2019   Morbid obesity ([place]) 02/15/2018   Lipoma  of left shoulder 12/02/2016   BMI 50.0-59.9, adult ([place])    Essential hypertension 09/06/2014   Osteoarthritis 09/06/2014   Post-menopausal bleeding 09/06/2014   OSA (obstructive sleep apnea) 09/06/2014   Generalized anxiety disorder 09/06/2014   Vitamin D deficiency 09/06/2014   Chronic back pain    Past Medical History:  Diagnosis Date   Bilateral primary osteoarthritis of knee 03/29/2019   Chronic back pain    Cystocele with rectocele    Generalized anxiety disorder    GERD (gastroesophageal reflux disease)    Headache    otc med prn   Heart murmur    echo in epic 11-26-2017  normal w/ ef 55-60%   History of chest pain    pt referred for  atyical chest pain , cardiology evaluation w/ dr berry note in epic 10-25-2014, stated with low risk w/ no ischemia , nuclear ef 70% , done 09-19-2014 in epic,  non-cardiac chest pain   HTN (hypertension)    followed by pcp    (normal nuclear stress test 09-19-2014 in epic)   Low blood potassium    Major depressive disorder    OSA (obstructive sleep apnea)    04/2015 study in e- Severe OSA (AHI = 55.0/hour);   (10-06-2019  per pt has not used cpap since 2019 after gastric bypass)   Osteoarthritis of both hips 06/15/2019   Plantar fasciitis of left foot    S/P gastric bypass 02/15/2018   sleeve   SUI (stress urinary incontinence, female)    Vitamin D deficiency 09/06/2014   Wears glasses     Family History  Problem Relation Age of Onset   Lung cancer Mother 9       dec   Cancer Mother    Hypertension Mother    Arthritis Mother    Cancer Father 51       prostate   Hypertension Father    Leukemia Sister    Hypertension Sister    Arthritis Sister    Hyperlipidemia Sister    Hypertension Brother    Arthritis Brother    Multiple sclerosis Sister    Hypertension Sister    Hypertension Sister    Hypertension Brother    Hypertension Brother    Breast cancer Paternal Aunt    Alzheimer's disease Paternal Aunt    Diabetes Maternal Grandmother    Stroke Maternal Grandmother    Heart attack Paternal Grandmother    Asthma Other     Past Surgical History:  Procedure Laterality Date   BLADDER SUSPENSION N/A 10/12/2019   Procedure: TRANSVAGINAL TAPE (TVT) PROCEDURE;  Surgeon: Bobbye Charleston, MD;  Location: [facility];  Service: Gynecology;  Laterality: N/A;   CESAREAN SECTION W/BTL  1990   COLONOSCOPY WITH PROPOFOL N/A 08/30/2015   Procedure: COLONOSCOPY WITH PROPOFOL;  Surgeon: Mauri Pole, MD;  [facility];  Service: Endoscopy;  Laterality: N/A;   CYSTOCELE REPAIR N/A 10/12/2019   Procedure: ANTERIOR (CYSTOCELE);  Surgeon: Bobbye Charleston, MD;   Location[facility];  Service: Gynecology;  Laterality: N/A;   CYSTOSCOPY N/A 05/23/2015   Procedure: CYSTOSCOPY;  Surgeon: Bobbye Charleston, MD;  Location: [facility];  Service: Gynecology;  Laterality: N/A;   CYSTOSCOPY N/A 10/12/2019   Procedure: CYSTOSCOPY;  Surgeon: Bobbye Charleston, MD;  Location: [facility];  Service: Gynecology;  Laterality: N/A;   HIATAL HERNIA REPAIR N/A 02/15/2018   Procedure: HERNIA REPAIR HIATAL;  Surgeon: Excell Seltzer, MD;  Location[facility];  Service: General;  Laterality: N/A;   HYSTEROSCOPY WITH D & C N/A 01/11/2015   Procedure: DILATATION AND CURETTAGE /HYSTEROSCOPY;  Surgeon: Jerelyn Charles, MD;  Location: [facility];  Service: Gynecology;  Laterality: N/A;   KNEE ARTHROSCOPY Right 2010   LAPAROSCOPIC GASTRIC SLEEVE RESECTION N/A 02/15/2018   Procedure: LAPAROSCOPIC GASTRIC SLEEVE RESECTION WITH UPPER ENDO AND ERAS PATHWAY;  Surgeon: Excell Seltzer, MD;  Location[facility];  Service: General;  Laterality: N/A;   LAPAROSCOPIC OOPHORECTOMY Bilateral 2004   LEFT OOPHORECTOMY AND PARTIAL RIGHT OOPHORECTOMY   LIPOMA EXCISION Left 12/02/2016   Procedure: EXCISION LIPOMA OF LEFT SHOULDER;  Surgeon: Garald Balding, MD;  Location: [facility];  Service: Orthopedics;  Laterality: Left;   ROBOTIC ASSISTED TOTAL HYSTERECTOMY WITH SALPINGECTOMY Bilateral 05/23/2015   Procedure: ROBOTIC ASSISTED TOTAL HYSTERECTOMY WITH SALPINGECTOMYwith right oophorectomy;  Surgeon: Bobbye Charleston, MD;  Location: [facility];  Service: Gynecology;  Laterality: Bilateral;   UMBILICAL HERNIA REPAIR  2002   Social History   Occupational History   Occupation: Disability  Tobacco Use   Smoking status: Never   Smokeless tobacco: Never  Vaping Use   Vaping Use: Never used  Substance and Sexual Activity   Alcohol use: No    Alcohol/week: 0.0 standard drinks   Drug use: Never   Sexual activity: Not on file    Comment: BTL w/ C/S in 1990

## 2020-11-28 NOTE — Assessment & Plan Note (Addendum)
BP Readings from Last 3 Encounters:  11/28/20 (!) 147/86  09/11/20 126/84  08/22/20 (!) 143/86   Fair control. Will continue current meds. Monitor.

## 2020-11-28 NOTE — Assessment & Plan Note (Signed)
Fair control on wellbutrin. Contine same.

## 2020-11-28 NOTE — Progress Notes (Signed)
Subjective:   By signing my name below, I, [name], attest that this documentation has been prepared under the direction and in the presence of [name]. 11/28/2020    Patient ID: Hailey Vance, female    DOB: Nov 08, 1965, 55 y.o.   MRN: [id number]  Chief Complaint  Patient presents with   Follow-up    Follow up after covid, still having productive cough   Shoulder Pain    Complains of left shoulder pain since getting a covid shot at pharmacy in June.    Hypertension    Here for follow up    HPI Patient is in today for a office visit.   Left shoulder pain- She complains of left shoulder pain since her last Covid-19 vaccine in that arm. She also has weakness in her left hand. She has not had a similar reaction to previous Covid-19 vaccines. She has seen her orthopedist specialist Dr. Durward Fortes and was told it was arthritis.  Covid-19- She reports having Covid-19 on 10/28/2020. Most her symptoms have improved but she continues having a mild cough with sputum production.  Gabapentin- She continues having pain down her right leg and notes that her gabapentin is only mildly effective.  Blood pressure- Her blood pressure is slightly elevated during this visit. She continues taking 7.5 mg amlodipine daily PO, 25 mg metoprolol succinate daily PO, 20-25 mg Zestoretic daily PO and reports no new issues while taking it.  BP Readings from Last 3 Encounters:  11/28/20 (!) 147/86  09/11/20 126/84  08/22/20 (!) 143/86   Pulse Readings from Last 3 Encounters:  11/28/20 74  09/11/20 87  08/22/20 91   Reflux- She continues taking 40 mg Prilosec daily PO and reports doing well while taking it.  Mood- Her mood is staying the same since her last visit. She continues taking 300 mg Wellbutrin daily PO.  CPAP- Her Cpap machine is coming in today from delivery and she is planning on using it daily.    Health Maintenance Due  Topic Date Due   Zoster Vaccines- Shingrix (1 of  2) Never done   PAP SMEAR-Modifier  10/01/2017   COVID-19 Vaccine (2 - Pfizer series) 09/12/2020   INFLUENZA VACCINE  10/01/2020    Past Medical History:  Diagnosis Date   Bilateral primary osteoarthritis of knee 03/29/2019   Chronic back pain    Cystocele with rectocele    Generalized anxiety disorder    GERD (gastroesophageal reflux disease)    Headache    otc med prn   Heart murmur    echo in epic 11-26-2017  normal w/ ef 55-60%   History of chest pain    pt referred for atyical chest pain , cardiology evaluation w/ dr berry note in epic 10-25-2014, stated with low risk w/ no ischemia , nuclear ef 70% , done 09-19-2014 in epic,  non-cardiac chest pain   HTN (hypertension)    followed by pcp    (normal nuclear stress test 09-19-2014 in epic)   Low blood potassium    Major depressive disorder    OSA (obstructive sleep apnea)    04/2015 study in e- Severe OSA (AHI = 55.0/hour);   (10-06-2019  per pt has not used cpap since 2019 after gastric bypass)   Osteoarthritis of both hips 06/15/2019   Plantar fasciitis of left foot    S/P gastric bypass 02/15/2018   sleeve   SUI (stress urinary incontinence, female)    Vitamin D deficiency 09/06/2014  Wears glasses     Past Surgical History:  Procedure Laterality Date   BLADDER SUSPENSION N/A 10/12/2019   Procedure: TRANSVAGINAL TAPE (TVT) PROCEDURE;  Surgeon: Bobbye Charleston, MD;  Location: [facility];  Service: Gynecology;  Laterality: N/A;   CESAREAN SECTION W/BTL  1990   COLONOSCOPY WITH PROPOFOL N/A 08/30/2015   Procedure: COLONOSCOPY WITH PROPOFOL;  Surgeon: Mauri Pole, MD;  Location: [facility];  Service: Endoscopy;  Laterality: N/A;   CYSTOCELE REPAIR N/A 10/12/2019   Procedure: ANTERIOR (CYSTOCELE);  Surgeon: Bobbye Charleston, MD;  Location: [facility];  Service: Gynecology;  Laterality: N/A;   CYSTOSCOPY N/A 05/23/2015   Procedure: CYSTOSCOPY;  Surgeon: Bobbye Charleston, MD;  Location:  [facility];  Service: Gynecology;  Laterality: N/A;   CYSTOSCOPY N/A 10/12/2019   Procedure: CYSTOSCOPY;  Surgeon: Bobbye Charleston, MD;  Location: [facility];  Service: Gynecology;  Laterality: N/A;   HIATAL HERNIA REPAIR N/A 02/15/2018   Procedure: HERNIA REPAIR HIATAL;  Surgeon: Excell Seltzer, MD;  Location: [facility];  Service: General;  Laterality: N/A;   HYSTEROSCOPY WITH D & C N/A 01/11/2015   Procedure: DILATATION AND CURETTAGE /HYSTEROSCOPY;  Surgeon: Jerelyn Charles, MD;  Location: [facility];  Service: Gynecology;  Laterality: N/A;   KNEE ARTHROSCOPY Right 2010   LAPAROSCOPIC GASTRIC SLEEVE RESECTION N/A 02/15/2018   Procedure: LAPAROSCOPIC GASTRIC SLEEVE RESECTION WITH UPPER ENDO AND ERAS PATHWAY;  Surgeon: Excell Seltzer, MD;  Location: [facility];  Service: General;  Laterality: N/A;   LAPAROSCOPIC OOPHORECTOMY Bilateral 2004   LEFT OOPHORECTOMY AND PARTIAL RIGHT OOPHORECTOMY   LIPOMA EXCISION Left 12/02/2016   Procedure: EXCISION LIPOMA OF LEFT SHOULDER;  Surgeon: Garald Balding, MD;  Location: [facility];  Service: Orthopedics;  Laterality: Left;   ROBOTIC ASSISTED TOTAL HYSTERECTOMY WITH SALPINGECTOMY Bilateral 05/23/2015   Procedure: ROBOTIC ASSISTED TOTAL HYSTERECTOMY WITH SALPINGECTOMYwith right oophorectomy;  Surgeon: Bobbye Charleston, MD;  Location: [facility];  Service: Gynecology;  Laterality: Bilateral;   UMBILICAL HERNIA REPAIR  2002    Family History  Problem Relation Age of Onset   Lung cancer Mother 12       dec   Cancer Mother    Hypertension Mother    Arthritis Mother    Cancer Father 35       prostate   Hypertension Father    Leukemia Sister    Hypertension Sister    Arthritis Sister    Hyperlipidemia Sister    Hypertension Brother    Arthritis Brother    Multiple sclerosis Sister    Hypertension Sister    Hypertension Sister    Hypertension Brother    Hypertension Brother    Breast cancer Paternal Aunt    Alzheimer's disease Paternal Aunt     Diabetes Maternal Grandmother    Stroke Maternal Grandmother    Heart attack Paternal Grandmother    Asthma Other     Social History   Socioeconomic History   Marital status: Divorced    Spouse name: Not on file   Number of children: Not on file   Years of education: 12   Highest education level: High school graduate  Occupational History   Occupation: Disability  Tobacco Use   Smoking status: Never   Smokeless tobacco: Never  Vaping Use   Vaping Use: Never used  Substance and Sexual Activity   Alcohol use: No    Alcohol/week: 0.0 standard drinks   Drug use: Never   Sexual activity: Not on file  Comment: BTL w/ C/S in 1990  Other Topics Concern   Not on file  Social History Narrative   Divorced   Lives with son and grand-daughter   [place] (lives in [place])   [place] lives with patient   65- Daughter [name] (lives in [place])   Works at [employer]   Completed some college   Careers adviser   Social Determinants of Radio broadcast assistant Strain: Not on [employer] Insecurity: Not on file  Transportation Needs: Not on file  Physical Activity: Not on file  Stress: Not on file  Social Connections: Not on file  Intimate Partner Violence: Not on file    Outpatient Medications Prior to Visit  Medication Sig Dispense Refill   amLODipine (NORVASC) 2.5 MG tablet Take 3 tablets (7.5 mg total) by mouth daily. 90 tablet 3   Artificial Tear Ointment (DRY EYES OP) Apply to eye as needed.     buPROPion (WELLBUTRIN XL) 300 MG 24 hr tablet TAKE 1 TABLET BY MOUTH EVERY DAY 90 tablet 1   Ca Phosphate-Cholecalciferol (CALTRATE GUMMY BITES PO) Take by mouth daily.      celecoxib (CELEBREX) 100 MG capsule Take 1 capsule (100 mg total) by mouth 2 (two) times daily as needed. 60 capsule 1   Cholecalciferol (VITAMIN D3) 50 MCG (2000 UT) CHEW Chew 8,000 Units by mouth daily.     COVID-19 mRNA Vac-TriS, Pfizer, SUSP injection Inject  into the muscle. 0.3 mL 0   Cyanocobalamin (B-12) 500 MCG TABS Take 1 tablet by mouth daily. 30 tablet    famotidine (PEPCID) 20 MG tablet Take 1 tablet (20 mg total) by mouth at bedtime.     lisinopril-hydrochlorothiazide (ZESTORETIC) 20-25 MG tablet Take 1 tablet by mouth once daily 90 tablet 1   Menthol, Topical Analgesic, 4 % GEL Apply 1 application topically daily.     metoprolol succinate (TOPROL-XL) 25 MG 24 hr tablet Take 1 tablet by mouth once daily 90 tablet 0   multivitamin-iron-minerals-folic acid (CENTRUM) chewable tablet Chew 1 tablet by mouth daily.     omeprazole (PRILOSEC) 40 MG capsule Take 1 capsule (40 mg total) by mouth daily. 90 capsule 3   venlafaxine XR (EFFEXOR-XR) 150 MG 24 hr capsule TAKE 1 CAPSULE BY MOUTH DAILY WITH BREAKFAST. 90 capsule 1   gabapentin (NEURONTIN) 100 MG capsule Take 1 capsule (100 mg total) by mouth 3 (three) times daily. 90 capsule 3   No facility-administered medications prior to visit.    Allergies  Allergen Reactions   Penicillins Itching and Other (See Comments)    Has patient had a PCN reaction causing immediate rash, facial/tongue/throat swelling, SOB or lightheadedness with hypotension: No Has patient had a PCN reaction causing severe rash involving mucus membranes or skin necrosis: No Has patient had a PCN reaction that required hospitalization No Has patient had a PCN reaction occurring within the last 10 years: No If all of the above answers are "NO", then may proceed with Cephalosporin use.     Review of Systems  Musculoskeletal:  Positive for joint pain (Left shoulder pain) and myalgias (pain down left leg).      Objective:    Physical Exam Constitutional:      General: She is not in acute distress.    Appearance: Normal appearance. She is not ill-appearing.  HENT:     Head: Normocephalic and atraumatic.     Right Ear: External ear normal.  Left Ear: External ear normal.  Eyes:     Extraocular Movements:  Extraocular movements intact.     Pupils: Pupils are equal, round, and reactive to light.  Cardiovascular:     Rate and Rhythm: Normal rate and regular rhythm.     Heart sounds: Normal heart sounds. No murmur heard.   No gallop.  Pulmonary:     Effort: Pulmonary effort is normal. No respiratory distress.     Breath sounds: Normal breath sounds. No wheezing or rales.  Skin:    General: Skin is warm and dry.  Neurological:     Mental Status: She is alert and oriented to person, place, and time.  Psychiatric:        Behavior: Behavior normal.        Judgment: Judgment normal.    BP (!) 147/86 (BP Location: Right Arm, Patient Position: Sitting, Cuff Size: Large)   Pulse 74   Temp 98 F (36.7 C) (Oral)   Resp 16   Ht 5\' 4"  (1.626 m)   Wt (!) 312 lb (141.5 kg)   LMP 10/02/2007   SpO2 100%   BMI 53.55 kg/m  Wt Readings from Last 3 Encounters:  11/28/20 (!) 312 lb (141.5 kg)  09/11/20 (!) 310 lb (140.6 kg)  08/22/20 (!) 310 lb (140.6 kg)       Assessment & Plan:   Problem List Items Addressed This Visit       Unprioritized   Lumbar radiculopathy - Primary    Uncontrolled. Will increase gabapentin from 100mg  tid to 300mg  tid.       Relevant Medications   gabapentin (NEURONTIN) 300 MG capsule   Left shoulder pain    Advised pt to follow up with her orthopedic specialist about this.       Generalized anxiety disorder    Fair control on wellbutrin. Contine same.       Essential hypertension    BP Readings from Last 3 Encounters:  11/28/20 (!) 147/86  09/11/20 126/84  08/22/20 (!) 143/86        Relevant Orders   Basic metabolic panel (Completed)     Meds ordered this encounter  Medications   gabapentin (NEURONTIN) 300 MG capsule    Sig: Take 1 capsule (300 mg total) by mouth 3 (three) times daily.    Dispense:  90 capsule    Refill:  3    Order Specific Question:   Supervising Provider    Answer:   Penni Homans A [[id number]]    I, [name],  personally preformed the services described in this documentation.  All medical record entries made by the scribe were at my direction and in my presence.  I have reviewed the chart and discharge instructions (if applicable) and agree that the record reflects my personal performance and is accurate and complete. 11/28/2020   I,[name],acting as a scribe for Nance Pear, NP.,have documented all relevant documentation on the behalf of Nance Pear, NP,as directed by  Nance Pear, NP while in the presence of Nance Pear, NP.   Nance Pear, NP

## 2020-11-29 NOTE — Assessment & Plan Note (Signed)
Advised pt to follow up with her orthopedic specialist about this.

## 2020-11-29 NOTE — Assessment & Plan Note (Signed)
Uncontrolled. Will increase gabapentin from 100mg  tid to 300mg  tid.

## 2020-12-18 DIAGNOSIS — G4733 Obstructive sleep apnea (adult) (pediatric): Secondary | ICD-10-CM | POA: Diagnosis not present

## 2021-01-18 DIAGNOSIS — G4733 Obstructive sleep apnea (adult) (pediatric): Secondary | ICD-10-CM | POA: Diagnosis not present

## 2021-02-14 ENCOUNTER — Encounter: Payer: Self-pay | Admitting: Family

## 2021-02-16 ENCOUNTER — Other Ambulatory Visit: Payer: Self-pay | Admitting: Family

## 2021-02-17 DIAGNOSIS — G4733 Obstructive sleep apnea (adult) (pediatric): Secondary | ICD-10-CM | POA: Diagnosis not present

## 2021-02-18 ENCOUNTER — Other Ambulatory Visit: Payer: Self-pay

## 2021-02-18 MED ORDER — VENLAFAXINE HCL ER 150 MG PO CP24: 150.0000 mg | ORAL_CAPSULE | Freq: Every day | ORAL | 1 refills | Status: DC

## 2021-02-26 ENCOUNTER — Encounter: Payer: Self-pay | Admitting: Physical Medicine and Rehabilitation

## 2021-02-27 ENCOUNTER — Telehealth: Payer: Self-pay | Admitting: Physical Medicine and Rehabilitation

## 2021-02-27 NOTE — Telephone Encounter (Signed)
Patient called. Returning a call to [place] 

## 2021-02-27 NOTE — Telephone Encounter (Signed)
Pt called stating she would like to get a hip injection appt.  [phone]

## 2021-03-05 ENCOUNTER — Ambulatory Visit (INDEPENDENT_AMBULATORY_CARE_PROVIDER_SITE_OTHER): Payer: Medicare Other | Admitting: Orthopaedic Surgery

## 2021-03-05 ENCOUNTER — Other Ambulatory Visit: Payer: Self-pay

## 2021-03-05 ENCOUNTER — Encounter: Payer: Self-pay | Admitting: Family

## 2021-03-05 ENCOUNTER — Ambulatory Visit (INDEPENDENT_AMBULATORY_CARE_PROVIDER_SITE_OTHER): Payer: Commercial Managed Care - HMO | Admitting: Family

## 2021-03-05 VITALS — BP 138/90 | HR 79 | Temp 98.5°F | Resp 16 | Wt 326.6 lb

## 2021-03-05 DIAGNOSIS — J209 Acute bronchitis, unspecified: Secondary | ICD-10-CM | POA: Diagnosis not present

## 2021-03-05 DIAGNOSIS — I10 Essential (primary) hypertension: Secondary | ICD-10-CM | POA: Diagnosis not present

## 2021-03-05 DIAGNOSIS — M170 Bilateral primary osteoarthritis of knee: Secondary | ICD-10-CM

## 2021-03-05 DIAGNOSIS — Z0000 Encounter for general adult medical examination without abnormal findings: Secondary | ICD-10-CM

## 2021-03-05 LAB — BASIC METABOLIC PANEL
BUN: 13 mg/dL (ref 6–23)
CO2: 26 mEq/L (ref 19–32)
Calcium: 8.7 mg/dL (ref 8.4–10.5)
Chloride: 104 mEq/L (ref 96–112)
Creatinine, Ser: 1.23 mg/dL — ABNORMAL HIGH (ref 0.40–1.20)
GFR: 48.94 mL/min — ABNORMAL LOW (ref >60.00)
Glucose, Bld: 105 mg/dL — ABNORMAL HIGH (ref 70–99)
Potassium: 3.9 mEq/L (ref 3.5–5.1)
Sodium: 140 mEq/L (ref 135–145)

## 2021-03-05 MED ORDER — METHYLPREDNISOLONE ACETATE 40 MG/ML IJ SUSP
80.0000 mg | INTRAMUSCULAR | Status: AC | PRN
  Administered 2021-03-05: 80 mg via INTRA_ARTICULAR

## 2021-03-05 MED ORDER — LIDOCAINE HCL 1 % IJ SOLN
5.0000 mL | INTRAMUSCULAR | Status: AC | PRN
  Administered 2021-03-05: 5 mL

## 2021-03-05 MED ORDER — AZITHROMYCIN 250 MG PO TABS: ORAL_TABLET | ORAL | 0 refills | Status: AC

## 2021-03-05 MED ORDER — TIRZEPATIDE 2.5 MG/0.5ML ~~LOC~~ SOAJ: SUBCUTANEOUS | 1 refills | Status: DC

## 2021-03-05 MED ORDER — ALBUTEROL SULFATE HFA 108 (90 BASE) MCG/ACT IN AERS: 2.0000 | INHALATION_SPRAY | Freq: Four times a day (QID) | RESPIRATORY_TRACT | 0 refills | Status: DC | PRN

## 2021-03-05 MED ORDER — PREDNISONE 10 MG PO TABS: ORAL_TABLET | ORAL | 0 refills | Status: DC

## 2021-03-05 NOTE — Assessment & Plan Note (Signed)
°  Start albuterol as needed for wheezing, prednisone taper and Zpak. Call if symptoms worsen of if symptoms do not improve.

## 2021-03-05 NOTE — Progress Notes (Signed)
Subjective:     Patient ID: Hailey Vance, female    DOB: 26-Mar-1965, 56 y.o.   MRN: [id number]  Chief Complaint  Patient presents with   Annual Exam    HPI Patient is in today for CPX.  Immunizations: tdap 2016, reports that she had some pain in the left shoulder following the covid vaccine.  Diet: she sometimes only eats once a day.  Last night she had ham, broccoli.  Had a yogurt, 2 grapefruit juices, water.  Exercise: Reports that she is not moving much because of her bilateral knee pain.   Wt Readings from Last 3 Encounters:  03/05/21 (!) 326 lb 9.6 oz (148.1 kg)  11/28/20 (!) 312 lb (141.5 kg)  09/11/20 (!) 310 lb (140.6 kg)   Reports cough which began last Thursday.  Started with drainage. Notes some wheezing. Sputum is green.  She did a home test for covid which was not positive.  She is trying to lose weight.   Colonoscopy: 08/30/15 will need 10 year follow up Pap Smear:  UTD per gyn Mammogram: 04/23/14 Vision: UTD Dental: UTD  Health Maintenance Due  Topic Date Due   Zoster Vaccines- Shingrix (1 of 2) Never done   PAP SMEAR-Modifier  10/01/2017   COVID-19 Vaccine (2 - Pfizer series) 09/12/2020   INFLUENZA VACCINE  10/01/2020    Past Medical History:  Diagnosis Date   Bilateral primary osteoarthritis of knee 03/29/2019   Chronic back pain    Cystocele with rectocele    Generalized anxiety disorder    GERD (gastroesophageal reflux disease)    Headache    otc med prn   Heart murmur    echo in epic 11-26-2017  normal w/ ef 55-60%   History of chest pain    pt referred for atyical chest pain , cardiology evaluation w/ dr berry note in epic 10-25-2014, stated with low risk w/ no ischemia , nuclear ef 70% , done 09-19-2014 in epic,  non-cardiac chest pain   HTN (hypertension)    followed by pcp    (normal nuclear stress test 09-19-2014 in epic)   Low blood potassium    Major depressive disorder    OSA (obstructive sleep apnea)    04/2015  study in e- Severe OSA (AHI = 55.0/hour);   (10-06-2019  per pt has not used cpap since 2019 after gastric bypass)   Osteoarthritis of both hips 06/15/2019   Plantar fasciitis of left foot    S/P gastric bypass 02/15/2018   sleeve   SUI (stress urinary incontinence, female)    Vitamin D deficiency 09/06/2014   Wears glasses     Past Surgical History:  Procedure Laterality Date   BLADDER SUSPENSION N/A 10/12/2019   Procedure: TRANSVAGINAL TAPE (TVT) PROCEDURE;  Surgeon: Bobbye Charleston, MD;  Location: [facility];  Service: Gynecology;  Laterality: N/A;   CESAREAN SECTION W/BTL  1990   COLONOSCOPY WITH PROPOFOL N/A 08/30/2015   Procedure: COLONOSCOPY WITH PROPOFOL;  Surgeon: Mauri Pole, MD;  [facility];  Service: Endoscopy;  Laterality: N/A;   CYSTOCELE REPAIR N/A 10/12/2019   Procedure: ANTERIOR (CYSTOCELE);  Surgeon: Bobbye Charleston, MD;  Location: [facility];  Service: Gynecology;  Laterality: N/A;   CYSTOSCOPY N/A 05/23/2015   Procedure: CYSTOSCOPY;  Surgeon: Bobbye Charleston, MD;  Location: [facility];  Service: Gynecology;  Laterality: N/A;   CYSTOSCOPY N/A 10/12/2019   Procedure: CYSTOSCOPY;  Surgeon: Bobbye Charleston, MD;  Location: [facility];  Service: Gynecology;  Laterality: N/A;   HIATAL HERNIA REPAIR N/A 02/15/2018   Procedure: HERNIA REPAIR HIATAL;  Surgeon: Excell Seltzer, MD;  [facility];  Service: General;  Laterality: N/A;   HYSTEROSCOPY WITH D & C N/A 01/11/2015   Procedure: DILATATION AND CURETTAGE /HYSTEROSCOPY;  Surgeon: Jerelyn Charles, MD;  Location: [facility];  Service: Gynecology;  Laterality: N/A;   KNEE ARTHROSCOPY Right 2010   LAPAROSCOPIC GASTRIC SLEEVE RESECTION N/A 02/15/2018   Procedure: LAPAROSCOPIC GASTRIC SLEEVE RESECTION WITH UPPER ENDO AND ERAS PATHWAY;  Surgeon: Excell Seltzer, MD;  [facility];  Service: General;  Laterality: N/A;   LAPAROSCOPIC OOPHORECTOMY  Bilateral 2004   LEFT OOPHORECTOMY AND PARTIAL RIGHT OOPHORECTOMY   LIPOMA EXCISION Left 12/02/2016   Procedure: EXCISION LIPOMA OF LEFT SHOULDER;  Surgeon: Garald Balding, MD;  Location: [facility];  Service: Orthopedics;  Laterality: Left;   ROBOTIC ASSISTED TOTAL HYSTERECTOMY WITH SALPINGECTOMY Bilateral 05/23/2015   Procedure: ROBOTIC ASSISTED TOTAL HYSTERECTOMY WITH SALPINGECTOMYwith right oophorectomy;  Surgeon: Bobbye Charleston, MD;  Location: [facility];  Service: Gynecology;  Laterality: Bilateral;   UMBILICAL HERNIA REPAIR  2002    Family History  Problem Relation Age of Onset   Lung cancer Mother 34       dec   Cancer Mother    Hypertension Mother    Arthritis Mother    Cancer Father 42       prostate   Hypertension Father    Leukemia Sister    Hypertension Sister    Arthritis Sister    Hyperlipidemia Sister    Hypertension Brother    Arthritis Brother    Multiple sclerosis Sister    Hypertension Sister    Hypertension Sister    Hypertension Brother    Hypertension Brother    Breast cancer Paternal Aunt    Alzheimer's disease Paternal Aunt    Diabetes Maternal Grandmother    Stroke Maternal Grandmother    Heart attack Paternal Grandmother    Asthma Other     Social History   Socioeconomic History   Marital status: Divorced    Spouse name: Not on file   Number of children: Not on file   Years of education: 12   Highest education level: High school graduate  Occupational History   Occupation: Disability  Tobacco Use   Smoking status: Never   Smokeless tobacco: Never  Vaping Use   Vaping Use: Never used  Substance and Sexual Activity   Alcohol use: No    Alcohol/week: 0.0 standard drinks   Drug use: Never   Sexual activity: Not on file    Comment: BTL w/ C/S in 1990  Other Topics Concern   Not on file  Social History Narrative   Divorced   Lives with son and grand-daughter   80- [place] (lives in [place])   [place] lives with patient   44- Daughter [name] (lives in [place])   Works at [employer]   Completed some college   Careers adviser   Social Determinants of Radio broadcast assistant Strain: Not on file  Food Insecurity: Not on file  Transportation Needs: Not on file  Physical Activity: Not on file  Stress: Not on file  Social Connections: Not on file  Intimate Partner Violence: Not on file    Outpatient Medications Prior to Visit  Medication Sig Dispense Refill   amLODipine (NORVASC) 2.5 MG tablet Take 3 tablets (7.5 mg total) by mouth daily.  90 tablet 3   Artificial Tear Ointment (DRY EYES OP) Apply to eye as needed.     buPROPion (WELLBUTRIN XL) 300 MG 24 hr tablet TAKE 1 TABLET BY MOUTH EVERY DAY 90 tablet 1   Ca Phosphate-Cholecalciferol (CALTRATE GUMMY BITES PO) Take by mouth daily.      celecoxib (CELEBREX) 100 MG capsule Take 1 capsule (100 mg total) by mouth 2 (two) times daily as needed. 60 capsule 1   Cholecalciferol (VITAMIN D3) 50 MCG (2000 UT) CHEW Chew 8,000 Units by mouth daily.     COVID-19 mRNA Vac-TriS, Pfizer, SUSP injection Inject into the muscle. 0.3 mL 0   Cyanocobalamin (B-12) 500 MCG TABS Take 1 tablet by mouth daily. 30 tablet    famotidine (PEPCID) 20 MG tablet Take 1 tablet (20 mg total) by mouth at bedtime.     gabapentin (NEURONTIN) 300 MG capsule Take 1 capsule (300 mg total) by mouth 3 (three) times daily. 90 capsule 3   lisinopril-hydrochlorothiazide (ZESTORETIC) 20-25 MG tablet Take 1 tablet by mouth once daily 90 tablet 1   Menthol, Topical Analgesic, 4 % GEL Apply 1 application topically daily.     metoprolol succinate (TOPROL-XL) 25 MG 24 hr tablet Take 1 tablet (25 mg total) by mouth daily. Take with or immediately following a meal 90 tablet 1   multivitamin-iron-minerals-folic acid (CENTRUM) chewable tablet Chew 1 tablet by mouth daily.     omeprazole (PRILOSEC) 40 MG capsule Take 1 capsule  (40 mg total) by mouth daily. 90 capsule 3   venlafaxine XR (EFFEXOR-XR) 150 MG 24 hr capsule Take 1 capsule (150 mg total) by mouth daily with breakfast. 90 capsule 1   No facility-administered medications prior to visit.    Allergies  Allergen Reactions   Penicillins Itching and Other (See Comments)    Has patient had a PCN reaction causing immediate rash, facial/tongue/throat swelling, SOB or lightheadedness with hypotension: No Has patient had a PCN reaction causing severe rash involving mucus membranes or skin necrosis: No Has patient had a PCN reaction that required hospitalization No Has patient had a PCN reaction occurring within the last 10 years: No If all of the above answers are "NO", then may proceed with Cephalosporin use.     Review of Systems  Constitutional:  Negative for weight loss.  HENT:  Positive for congestion and hearing loss.   Eyes:  Negative for blurred vision.  Respiratory:  Positive for shortness of breath (due to URI symptoms).   Cardiovascular:  Positive for leg swelling.  Gastrointestinal:  Negative for constipation and diarrhea.  Genitourinary:  Negative for dysuria and frequency.  Musculoskeletal:  Positive for joint pain.  Skin:  Negative for rash.  Neurological:  Positive for headaches (occasional- if she does not wear cpap).  Psychiatric/Behavioral:         Notes some depression symptoms but feels like her chronic pain and lack of mobility depresses her.       Objective:    Physical Exam Constitutional:      General: She is not in acute distress.    Appearance: Normal appearance. She is well-developed.  HENT:     Head: Normocephalic and atraumatic.     Right Ear: Tympanic membrane, ear canal and external ear normal.     Left Ear: Tympanic membrane, ear canal and external ear normal.  Eyes:     General: No scleral icterus. Neck:     Thyroid: No thyromegaly.  Cardiovascular:     Rate and  Rhythm: Normal rate and regular rhythm.      Heart sounds: Normal heart sounds. No murmur heard. Pulmonary:     Effort: Pulmonary effort is normal. No respiratory distress.     Breath sounds: Wheezing present.  Musculoskeletal:     Cervical back: Neck supple.     Right lower leg: Edema present.     Left lower leg: Edema present.  Skin:    General: Skin is warm and dry.  Neurological:     Mental Status: She is alert and oriented to person, place, and time.  Psychiatric:        Mood and Affect: Mood normal.        Behavior: Behavior normal.        Thought Content: Thought content normal.        Judgment: Judgment normal.    BP 138/90 (BP Location: Right Arm, Patient Position: Sitting, Cuff Size: Large)    Pulse 79    Temp 98.5 F (36.9 C) (Oral)    Resp 16    Wt (!) 326 lb 9.6 oz (148.1 kg)    LMP 10/02/2007    SpO2 98%    BMI 56.06 kg/m  Wt Readings from Last 3 Encounters:  03/05/21 (!) 326 lb 9.6 oz (148.1 kg)  11/28/20 (!) 312 lb (141.5 kg)  09/11/20 (!) 310 lb (140.6 kg)       Assessment & Plan:   Problem List Items Addressed This Visit       Unprioritized   Preventative health care - Primary    Discussed healthy diet, exercise, weight loss. Specifically recommended water exercise/aerobics due to her her severe knee pain.  Pap and mammo up to date. Will hold off on flu/covid vaccines for a week or so until she is feeling better from her respiratory illness.       Morbid obesity ([place])    Having continued weight gain. Will see if her insurance will cover mounjaro.       Relevant Medications   tirzepatide [facility]) 2.5 MG/0.5ML Pen   Essential hypertension    BP Readings from Last 3 Encounters:  03/05/21 138/90  11/28/20 (!) 147/86  09/11/20 126/84  BP stable. Check follow up bmet.       Bronchitis with bronchospasm     Start albuterol as needed for wheezing, prednisone taper and Zpak. Call if symptoms worsen of if symptoms do not improve.         Other Visit Diagnoses     Primary hypertension        Relevant Orders   Basic metabolic panel       I am having Erick Blinks. Vessell start on tirzepatide, albuterol, predniSONE, and azithromycin. I am also having her maintain her Menthol (Topical Analgesic), multivitamin-iron-minerals-folic acid, Vitamin D3, Ca Phosphate-Cholecalciferol (CALTRATE GUMMY BITES PO), B-12, Artificial Tear Ointment (DRY EYES OP), omeprazole, lisinopril-hydrochlorothiazide, amLODipine, celecoxib, COVID-19 mRNA Vac-TriS (Pfizer), famotidine, buPROPion, gabapentin, metoprolol succinate, and venlafaxine XR.  Meds ordered this encounter  Medications   tirzepatide (MOUNJARO) 2.5 MG/0.5ML Pen    Sig: Inject 2.5 mg subcutaneously once weekly    Dispense:  2 mL    Refill:  1    Order Specific Question:   Supervising Provider    Answer:   Penni Homans A [[id number]]   albuterol (VENTOLIN HFA) 108 (90 Base) MCG/ACT inhaler    Sig: Inhale 2 puffs into the lungs every 6 (six) hours as needed for wheezing or shortness of breath.    Dispense:  8 g    Refill:  0    Order Specific Question:   Supervising Provider    Answer:   Penni Homans A [[id number]]   predniSONE (DELTASONE) 10 MG tablet    Sig: 4 tabs by mouth once daily for 2 days, then 3 tabs daily x 2 days, then 2 tabs daily x 2 days, then 1 tab daily x 2 days    Dispense:  20 tablet    Refill:  0    Order Specific Question:   Supervising Provider    Answer:   Penni Homans A [[id number]]   azithromycin (ZITHROMAX) 250 MG tablet    Sig: Take 2 tablets on day 1, then 1 tablet daily on days 2 through 5    Dispense:  6 tablet    Refill:  0    Order Specific Question:   Supervising Provider    Answer:   Penni Homans A [[id number]]

## 2021-03-05 NOTE — Addendum Note (Signed)
Addended by: Georgette Dover on: 03/05/2021 02:11 PM   Modules accepted: Level of Service

## 2021-03-05 NOTE — Patient Instructions (Addendum)
Please consider getting a covid booster/flu shot as soon as you are feeling better Please schedule a nurse visit for teaching of the weight loss medication Mounjaro once you get the medication.  Start albuterol as needed for wheezing, prednisone taper and Zpak. Call if symptoms worsen of if symptoms do not improve.

## 2021-03-05 NOTE — Assessment & Plan Note (Signed)
Having continued weight gain. Will see if her insurance will cover mounjaro.

## 2021-03-05 NOTE — Progress Notes (Signed)
Office Visit Note   Patient: Hailey Vance           Date of Birth: [birth year]           MRN: [id number] Visit Date: 03/05/2021              Requested by: Debbrah Alar, NP [address] [place],  [place] [id number] PCP: Debbrah Alar, NP   Assessment & Plan: Visit Diagnoses: No diagnosis found.  Plan: Patient with a history of bilateral knee arthritis.  She has periodic steroid injections which seemed to help her.  She does ambulate with a walker.  She comes today requesting a new injection.  Last injections were in September  Follow-Up Instructions: No follow-ups on file.   Orders:  No orders of the defined types were placed in this encounter.  No orders of the defined types were placed in this encounter.     Procedures: Large Joint Inj: bilateral knee on 03/05/2021 1:37 PM Indications: pain and diagnostic evaluation Details: 25 G 1.5 in needle, anteromedial approach  Arthrogram: No  Medications (Right): 5 mL lidocaine 1 %; 80 mg methylPREDNISolone acetate 40 MG/ML Medications (Left): 5 mL lidocaine 1 %; 80 mg methylPREDNISolone acetate 40 MG/ML Outcome: tolerated well, no immediate complications Procedure, treatment alternatives, risks and benefits explained, specific risks discussed. Consent was given by the patient.      Clinical Data: No additional findings.   Subjective: Chief Complaint  Patient presents with   Right Knee - Pain   Left Knee - Pain  Patient presents today for bilateral knee pain. She was here in September of last year and received bilateral cortisone injections. She states that the injections last for two months. She is wanting to get both injected again today. The left is more painful than the right.     Review of Systems  All other systems reviewed and are negative.   Objective: Vital Signs: LMP 10/02/2007     Ortho Exam Patient appears well without complaints.  Bilateral knees no effusion no redness  no cellulitis globally tender to palpation left greater than right Specialty Comments:  No specialty comments available.  Imaging: No results found.   PMFS History: Patient Active Problem List   Diagnosis Date Noted   Left shoulder pain 09/12/2020   Primary osteoarthritis, left shoulder 09/11/2020   Dysphagia 08/23/2020   B12 deficiency 08/22/2020   Hyperlipidemia 08/22/2020   Moderate episode of recurrent major depressive disorder ([place]) 08/22/2020   Lumbar radiculopathy 08/22/2020   Major depressive disorder    Low back pain 06/15/2019   Osteoarthritis of both hips 06/15/2019   Morbid obesity ([place]) 02/15/2018   Lipoma of left shoulder 12/02/2016   BMI 50.0-59.9, adult [facility])    Essential hypertension 09/06/2014   Osteoarthritis 09/06/2014   Post-menopausal bleeding 09/06/2014   OSA (obstructive sleep apnea) 09/06/2014   Generalized anxiety disorder 09/06/2014   Vitamin D deficiency 09/06/2014   Chronic back pain    Past Medical History:  Diagnosis Date   Bilateral primary osteoarthritis of knee 03/29/2019   Chronic back pain    Cystocele with rectocele    Generalized anxiety disorder    GERD (gastroesophageal reflux disease)    Headache    otc med prn   Heart murmur    echo in epic 11-26-2017  normal w/ ef 55-60%   History of chest pain    pt referred for atyical chest pain , cardiology evaluation w/ dr berry note in  epic 10-25-2014, stated with low risk w/ no ischemia , nuclear ef 70% , done 09-19-2014 in epic,  non-cardiac chest pain   HTN (hypertension)    followed by pcp    (normal nuclear stress test 09-19-2014 in epic)   Low blood potassium    Major depressive disorder    OSA (obstructive sleep apnea)    04/2015 study in e- Severe OSA (AHI = 55.0/hour);   (10-06-2019  per pt has not used cpap since 2019 after gastric bypass)   Osteoarthritis of both hips 06/15/2019   Plantar fasciitis of left foot    S/P gastric bypass 02/15/2018   sleeve   SUI (stress  urinary incontinence, female)    Vitamin D deficiency 09/06/2014   Wears glasses     Family History  Problem Relation Age of Onset   Lung cancer Mother 48       dec   Cancer Mother    Hypertension Mother    Arthritis Mother    Cancer Father 47       prostate   Hypertension Father    Leukemia Sister    Hypertension Sister    Arthritis Sister    Hyperlipidemia Sister    Hypertension Brother    Arthritis Brother    Multiple sclerosis Sister    Hypertension Sister    Hypertension Sister    Hypertension Brother    Hypertension Brother    Breast cancer Paternal Aunt    Alzheimer's disease Paternal Aunt    Diabetes Maternal Grandmother    Stroke Maternal Grandmother    Heart attack Paternal Grandmother    Asthma Other     Past Surgical History:  Procedure Laterality Date   BLADDER SUSPENSION N/A 10/12/2019   Procedure: TRANSVAGINAL TAPE (TVT) PROCEDURE;  Surgeon: Bobbye Charleston, MD;  Location: [facility];  Service: Gynecology;  Laterality: N/A;   CESAREAN SECTION W/BTL  1990   COLONOSCOPY WITH PROPOFOL N/A 08/30/2015   Procedure: COLONOSCOPY WITH PROPOFOL;  Surgeon: Mauri Pole, MD;  [facility];  Service: Endoscopy;  Laterality: N/A;   CYSTOCELE REPAIR N/A 10/12/2019   Procedure: ANTERIOR (CYSTOCELE);  Surgeon: Bobbye Charleston, MD;  Location[facility];  Service: Gynecology;  Laterality: N/A;   CYSTOSCOPY N/A 05/23/2015   Procedure: CYSTOSCOPY;  Surgeon: Bobbye Charleston, MD;  Location: [facility];  Service: Gynecology;  Laterality: N/A;   CYSTOSCOPY N/A 10/12/2019   Procedure: CYSTOSCOPY;  Surgeon: Bobbye Charleston, MD;  Location: [facility];  Service: Gynecology;  Laterality: N/A;   HIATAL HERNIA REPAIR N/A 02/15/2018   Procedure: HERNIA REPAIR HIATAL;  Surgeon: Excell Seltzer, MD;  [facility];  Service: General;  Laterality: N/A;   HYSTEROSCOPY WITH D & C N/A 01/11/2015   Procedure: DILATATION AND  CURETTAGE /HYSTEROSCOPY;  Surgeon: Jerelyn Charles, MD;  Location: [facility];  Service: Gynecology;  Laterality: N/A;   KNEE ARTHROSCOPY Right 2010   LAPAROSCOPIC GASTRIC SLEEVE RESECTION N/A 02/15/2018   Procedure: LAPAROSCOPIC GASTRIC SLEEVE RESECTION WITH UPPER ENDO AND ERAS PATHWAY;  Surgeon: Excell Seltzer, MD;  [facility];  Service: General;  Laterality: N/A;   LAPAROSCOPIC OOPHORECTOMY Bilateral 2004   LEFT OOPHORECTOMY AND PARTIAL RIGHT OOPHORECTOMY   LIPOMA EXCISION Left 12/02/2016   Procedure: EXCISION LIPOMA OF LEFT SHOULDER;  Surgeon: Garald Balding, MD;  Location: [facility];  Service: Orthopedics;  Laterality: Left;   ROBOTIC ASSISTED TOTAL HYSTERECTOMY WITH SALPINGECTOMY Bilateral 05/23/2015   Procedure: ROBOTIC ASSISTED TOTAL HYSTERECTOMY WITH SALPINGECTOMYwith  right oophorectomy;  Surgeon: Bobbye Charleston, MD;  Location: [facility];  Service: Gynecology;  Laterality: Bilateral;   UMBILICAL HERNIA REPAIR  2002   Social History   Occupational History   Occupation: Disability  Tobacco Use   Smoking status: Never   Smokeless tobacco: Never  Vaping Use   Vaping Use: Never used  Substance and Sexual Activity   Alcohol use: No    Alcohol/week: 0.0 standard drinks   Drug use: Never   Sexual activity: Not on file    Comment: BTL w/ C/S in 1990

## 2021-03-05 NOTE — Assessment & Plan Note (Signed)
Discussed healthy diet, exercise, weight loss. Specifically recommended water exercise/aerobics due to her her severe knee pain.  Pap and mammo up to date. Will hold off on flu/covid vaccines for a week or so until she is feeling better from her respiratory illness.

## 2021-03-05 NOTE — Assessment & Plan Note (Signed)
BP Readings from Last 3 Encounters:  03/05/21 138/90  11/28/20 (!) 147/86  09/11/20 126/84   BP stable. Check follow up bmet.

## 2021-03-12 ENCOUNTER — Ambulatory Visit: Payer: Commercial Managed Care - HMO | Admitting: Physical Medicine and Rehabilitation

## 2021-03-13 ENCOUNTER — Encounter: Payer: Self-pay | Admitting: Family

## 2021-03-13 ENCOUNTER — Encounter: Payer: Self-pay | Admitting: Gastroenterology

## 2021-03-13 DIAGNOSIS — R059 Cough, unspecified: Secondary | ICD-10-CM

## 2021-03-13 NOTE — Telephone Encounter (Signed)
1) Patient referring to mounjaro subcutaneous injections. She was confused and thought someone was coming to her house. 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  2) I called [facility] about albuterol rx and they will submit to insurance "a different way and call patient when ready"

## 2021-03-16 ENCOUNTER — Telehealth (HOSPITAL_BASED_OUTPATIENT_CLINIC_OR_DEPARTMENT_OTHER): Payer: Self-pay

## 2021-03-18 ENCOUNTER — Encounter: Payer: Self-pay | Admitting: Family

## 2021-03-20 DIAGNOSIS — G4733 Obstructive sleep apnea (adult) (pediatric): Secondary | ICD-10-CM | POA: Diagnosis not present

## 2021-03-26 ENCOUNTER — Ambulatory Visit: Payer: Commercial Managed Care - HMO | Admitting: Physical Medicine and Rehabilitation

## 2021-03-26 ENCOUNTER — Telehealth: Payer: Self-pay | Admitting: Physical Medicine and Rehabilitation

## 2021-03-26 NOTE — Telephone Encounter (Signed)
Pt called to reschedule appt. Please call pt back at [phone].

## 2021-03-27 DIAGNOSIS — G4733 Obstructive sleep apnea (adult) (pediatric): Secondary | ICD-10-CM | POA: Diagnosis not present

## 2021-04-09 ENCOUNTER — Ambulatory Visit: Payer: Commercial Managed Care - HMO | Admitting: Physical Medicine and Rehabilitation

## 2021-04-17 ENCOUNTER — Ambulatory Visit: Payer: Medicare Other | Admitting: Family

## 2021-04-19 ENCOUNTER — Ambulatory Visit (INDEPENDENT_AMBULATORY_CARE_PROVIDER_SITE_OTHER): Payer: Medicare Other | Admitting: Family

## 2021-04-19 ENCOUNTER — Telehealth: Payer: Self-pay | Admitting: Family

## 2021-04-19 DIAGNOSIS — R1310 Dysphagia, unspecified: Secondary | ICD-10-CM | POA: Diagnosis not present

## 2021-04-19 DIAGNOSIS — M170 Bilateral primary osteoarthritis of knee: Secondary | ICD-10-CM | POA: Diagnosis not present

## 2021-04-19 DIAGNOSIS — I10 Essential (primary) hypertension: Secondary | ICD-10-CM | POA: Diagnosis not present

## 2021-04-19 MED ORDER — AMLODIPINE BESYLATE 10 MG PO TABS: 10.0000 mg | ORAL_TABLET | Freq: Every day | ORAL | 1 refills | Status: DC

## 2021-04-19 MED ORDER — DICLOFENAC SODIUM 1 % EX GEL: 2.0000 g | Freq: Every day | CUTANEOUS | 2 refills | Status: DC | PRN

## 2021-04-19 MED ORDER — NYSTATIN 100000 UNIT/GM EX POWD: 1.0000 "application " | Freq: Two times a day (BID) | CUTANEOUS | 2 refills | Status: DC

## 2021-04-19 MED ORDER — TIRZEPATIDE 5 MG/0.5ML ~~LOC~~ SOAJ: 5.0000 mg | SUBCUTANEOUS | 1 refills | Status: DC

## 2021-04-19 NOTE — Assessment & Plan Note (Signed)
BP Readings from Last 3 Encounters:  04/19/21 (!) 153/88  03/05/21 138/90  11/28/20 (!) 147/86   Above goal. Will increase amlodipine from 7.5mg  to 10mg  once daily.

## 2021-04-19 NOTE — Assessment & Plan Note (Signed)
She has follow up scheduled with GI.

## 2021-04-19 NOTE — Patient Instructions (Addendum)
Please increase moujaro to 5mg  once weekly.

## 2021-04-19 NOTE — Assessment & Plan Note (Signed)
She is noting + appetite suppression on moujaro with minimal side effects.  Unfortunately, she has actually gained weight since her last visit. Reinforced dietary changes.  Will increase mounjaro from 2.5 mg to 5mg .

## 2021-04-19 NOTE — Progress Notes (Signed)
Subjective:     Patient ID: Hailey Vance, female    DOB: [birth year], 57 y.o.   MRN: [id number]  Chief Complaint  Patient presents with   Weight management    Here for follow up    HPI Patient is in today for weight management follow up. Last visit we placed an order for mounjaro. She has been on mounjaro 2.5mg  x 4 weeks.  She notes occasional GI discomfort. Denies nausea or constipation.   Wt Readings from Last 3 Encounters:  04/19/21 (!) 323 lb 9.6 oz (146.8 kg)  03/05/21 (!) 326 lb 9.6 oz (148.1 kg)  11/28/20 (!) 312 lb (141.5 kg)   Bilateral knee pain- tylenol does not help, wondering what else she could take besides oral NSAIDs which were d/c'd due to her renal function.    Health Maintenance Due  Topic Date Due   Zoster Vaccines- Shingrix (1 of 2) Never done   COVID-19 Vaccine (2 - Pfizer series) 09/12/2020   INFLUENZA VACCINE  10/01/2020   PAP SMEAR-Modifier  11/20/2020    Past Medical History:  Diagnosis Date   Bilateral primary osteoarthritis of knee 03/29/2019   Chronic back pain    Cystocele with rectocele    Generalized anxiety disorder    GERD (gastroesophageal reflux disease)    Headache    otc med prn   Heart murmur    echo in epic 11-26-2017  normal w/ ef 55-60%   History of chest pain    pt referred for atyical chest pain , cardiology evaluation w/ dr berry note in epic 10-25-2014, stated with low risk w/ no ischemia , nuclear ef 70% , done 09-19-2014 in epic,  non-cardiac chest pain   HTN (hypertension)    followed by pcp    (normal nuclear stress test 09-19-2014 in epic)   Low blood potassium    Major depressive disorder    OSA (obstructive sleep apnea)    04/2015 study in e- Severe OSA (AHI = 55.0/hour);   (10-06-2019  per pt has not used cpap since 2019 after gastric bypass)   Osteoarthritis of both hips 06/15/2019   Plantar fasciitis of left foot    S/P gastric bypass 02/15/2018   sleeve   SUI (stress urinary incontinence, female)     Vitamin D deficiency 09/06/2014   Wears glasses     Past Surgical History:  Procedure Laterality Date   BLADDER SUSPENSION N/A 10/12/2019   Procedure: TRANSVAGINAL TAPE (TVT) PROCEDURE;  Surgeon: Bobbye Charleston, MD;  Location: [facility];  Service: Gynecology;  Laterality: N/A;   CESAREAN SECTION W/BTL  1990   COLONOSCOPY WITH PROPOFOL N/A 08/30/2015   Procedure: COLONOSCOPY WITH PROPOFOL;  Surgeon: Mauri Pole, MD;  [facility];  Service: Endoscopy;  Laterality: N/A;   CYSTOCELE REPAIR N/A 10/12/2019   Procedure: ANTERIOR (CYSTOCELE);  Surgeon: Bobbye Charleston, MD;  Location: [facility];  Service: Gynecology;  Laterality: N/A;   CYSTOSCOPY N/A 05/23/2015   Procedure: CYSTOSCOPY;  Surgeon: Bobbye Charleston, MD;  Location: [facility];  Service: Gynecology;  Laterality: N/A;   CYSTOSCOPY N/A 10/12/2019   Procedure: CYSTOSCOPY;  Surgeon: Bobbye Charleston, MD;  Location: [facility];  Service: Gynecology;  Laterality: N/A;   HIATAL HERNIA REPAIR N/A 02/15/2018   Procedure: HERNIA REPAIR HIATAL;  Surgeon: Excell Seltzer, MD;  Location: [facility];  Service: General;  Laterality: N/A;   HYSTEROSCOPY WITH D & C N/A 01/11/2015   Procedure: DILATATION AND CURETTAGE /HYSTEROSCOPY;  Surgeon: Jerelyn Charles, MD;  Location: [facility];  Service: Gynecology;  Laterality: N/A;   KNEE ARTHROSCOPY Right 2010   LAPAROSCOPIC GASTRIC SLEEVE RESECTION N/A 02/15/2018   Procedure: LAPAROSCOPIC GASTRIC SLEEVE RESECTION WITH UPPER ENDO AND ERAS PATHWAY;  Surgeon: Excell Seltzer, MD;  Location: [facility];  Service: General;  Laterality: N/A;   LAPAROSCOPIC OOPHORECTOMY Bilateral 2004   LEFT OOPHORECTOMY AND PARTIAL RIGHT OOPHORECTOMY   LIPOMA EXCISION Left 12/02/2016   Procedure: EXCISION LIPOMA OF LEFT SHOULDER;  Surgeon: Garald Balding, MD;  Location: [facility];  Service: Orthopedics;  Laterality: Left;   ROBOTIC ASSISTED TOTAL HYSTERECTOMY WITH SALPINGECTOMY  Bilateral 05/23/2015   Procedure: ROBOTIC ASSISTED TOTAL HYSTERECTOMY WITH SALPINGECTOMYwith right oophorectomy;  Surgeon: Bobbye Charleston, MD;  Location: [facility];  Service: Gynecology;  Laterality: Bilateral;   UMBILICAL HERNIA REPAIR  2002    Family History  Problem Relation Age of Onset   Lung cancer Mother 90       dec   Cancer Mother    Hypertension Mother    Arthritis Mother    Cancer Father 14       prostate   Hypertension Father    Leukemia Sister    Hypertension Sister    Arthritis Sister    Hyperlipidemia Sister    Hypertension Brother    Arthritis Brother    Multiple sclerosis Sister    Hypertension Sister    Hypertension Sister    Hypertension Brother    Hypertension Brother    Breast cancer Paternal Aunt    Alzheimer's disease Paternal Aunt    Diabetes Maternal Grandmother    Stroke Maternal Grandmother    Heart attack Paternal Grandmother    Asthma Other     Social History   Socioeconomic History   Marital status: Divorced    Spouse name: Not on file   Number of children: Not on file   Years of education: 12   Highest education level: High school graduate  Occupational History   Occupation: Disability  Tobacco Use   Smoking status: Never   Smokeless tobacco: Never  Vaping Use   Vaping Use: Never used  Substance and Sexual Activity   Alcohol use: No    Alcohol/week: 0.0 standard drinks   Drug use: Never   Sexual activity: Not on file    Comment: BTL w/ C/S in 1990  Other Topics Concern   Not on file  Social History Narrative   Divorced   Lives with son and grand-daughter   80- [place] (lives in [place])   [place] lives with patient   72- Daughter [name] (lives in [place])   Works at [employer]   Completed some college   Careers adviser   Social Determinants of Radio broadcast assistant Strain: Not on file  Food Insecurity: Not on file  Transportation Needs: Not on file  Physical Activity:  Not on file  Stress: Not on file  Social Connections: Not on file  Intimate Partner Violence: Not on file    Outpatient Medications Prior to Visit  Medication Sig Dispense Refill   albuterol (VENTOLIN HFA) 108 (90 Base) MCG/ACT inhaler Inhale 2 puffs into the lungs every 6 (six) hours as needed for wheezing or shortness of breath. 8 g 0   Artificial Tear Ointment (DRY EYES OP) Apply to eye as needed.     buPROPion (WELLBUTRIN XL) 300 MG 24 hr tablet TAKE 1 TABLET BY MOUTH EVERY DAY 90 tablet 1  Ca Phosphate-Cholecalciferol (CALTRATE GUMMY BITES PO) Take by mouth daily.      Cholecalciferol (VITAMIN D3) 50 MCG (2000 UT) CHEW Chew 8,000 Units by mouth daily.     COVID-19 mRNA Vac-TriS, Pfizer, SUSP injection Inject into the muscle. 0.3 mL 0   famotidine (PEPCID) 20 MG tablet Take 1 tablet (20 mg total) by mouth at bedtime.     gabapentin (NEURONTIN) 300 MG capsule Take 1 capsule (300 mg total) by mouth 3 (three) times daily. 90 capsule 3   lisinopril-hydrochlorothiazide (ZESTORETIC) 20-25 MG tablet Take 1 tablet by mouth once daily 90 tablet 1   Menthol, Topical Analgesic, 4 % GEL Apply 1 application topically daily.     metoprolol succinate (TOPROL-XL) 25 MG 24 hr tablet Take 1 tablet (25 mg total) by mouth daily. Take with or immediately following a meal 90 tablet 1   multivitamin-iron-minerals-folic acid (CENTRUM) chewable tablet Chew 1 tablet by mouth daily.     omeprazole (PRILOSEC) 40 MG capsule Take 1 capsule (40 mg total) by mouth daily. 90 capsule 3   venlafaxine XR (EFFEXOR-XR) 150 MG 24 hr capsule Take 1 capsule (150 mg total) by mouth daily with breakfast. 90 capsule 1   amLODipine (NORVASC) 2.5 MG tablet Take 3 tablets (7.5 mg total) by mouth daily. 90 tablet 3   tirzepatide (MOUNJARO) 2.5 MG/0.5ML Pen Inject 2.5 mg subcutaneously once weekly 2 mL 1   celecoxib (CELEBREX) 100 MG capsule Take 1 capsule (100 mg total) by mouth 2 (two) times daily as needed. 60 capsule 1    Cyanocobalamin (B-12) 500 MCG TABS Take 1 tablet by mouth daily. 30 tablet    predniSONE (DELTASONE) 10 MG tablet 4 tabs by mouth once daily for 2 days, then 3 tabs daily x 2 days, then 2 tabs daily x 2 days, then 1 tab daily x 2 days 20 tablet 0   No facility-administered medications prior to visit.    Allergies  Allergen Reactions   Penicillins Itching and Other (See Comments)    Has patient had a PCN reaction causing immediate rash, facial/tongue/throat swelling, SOB or lightheadedness with hypotension: No Has patient had a PCN reaction causing severe rash involving mucus membranes or skin necrosis: No Has patient had a PCN reaction that required hospitalization No Has patient had a PCN reaction occurring within the last 10 years: No If all of the above answers are "NO", then may proceed with Cephalosporin use.     ROS See HPI    Objective:    Physical Exam Constitutional:      General: She is not in acute distress.    Appearance: Normal appearance. She is well-developed.  HENT:     Head: Normocephalic and atraumatic.     Right Ear: External ear normal.     Left Ear: External ear normal.  Eyes:     General: No scleral icterus. Neck:     Thyroid: No thyromegaly.  Cardiovascular:     Rate and Rhythm: Normal rate and regular rhythm.     Heart sounds: Murmur heard.  Pulmonary:     Effort: Pulmonary effort is normal. No respiratory distress.     Breath sounds: Normal breath sounds. No wheezing.  Musculoskeletal:     Cervical back: Neck supple.     Right lower leg: 2+ Edema present.     Left lower leg: 2+ Edema present.  Skin:    General: Skin is warm and dry.  Neurological:     Mental Status: She is  alert and oriented to person, place, and time.  Psychiatric:        Mood and Affect: Mood normal.        Behavior: Behavior normal.        Thought Content: Thought content normal.        Judgment: Judgment normal.    BP (!) 153/88 (BP Location: Right Arm, Patient  Position: Sitting, Cuff Size: Large)    Pulse 89    Temp 98.7 F (37.1 C) (Oral)    Resp 16    Wt (!) 323 lb 9.6 oz (146.8 kg)    LMP 10/02/2007    SpO2 98%    BMI 55.55 kg/m  Wt Readings from Last 3 Encounters:  04/19/21 (!) 323 lb 9.6 oz (146.8 kg)  03/05/21 (!) 326 lb 9.6 oz (148.1 kg)  11/28/20 (!) 312 lb (141.5 kg)       Assessment & Plan:   Problem List Items Addressed This Visit       Unprioritized   Osteoarthritis    Recommended trial of sparing use of voltaren gel to knees.  Weight loss will also help considerably with her pain.       Morbid obesity ([place])    She is noting + appetite suppression on moujaro with minimal side effects.  Unfortunately, she has actually gained weight since her last visit. Reinforced dietary changes.  Will increase mounjaro from 2.5 mg to 5mg .       Relevant Medications   tirzepatide [facility]) 5 MG/0.5ML Pen   Essential hypertension    BP Readings from Last 3 Encounters:  04/19/21 (!) 153/88  03/05/21 138/90  11/28/20 (!) 147/86  Above goal. Will increase amlodipine from 7.5mg  to 10mg  once daily.      Relevant Medications   amLODipine (NORVASC) 10 MG tablet   Dysphagia    She has follow up scheduled with GI.        I have discontinued Erick Blinks. Magana's B-12, amLODipine, celecoxib, tirzepatide, and predniSONE. I am also having her start on tirzepatide, diclofenac Sodium, nystatin, and amLODipine. Additionally, I am having her maintain her Menthol (Topical Analgesic), multivitamin-iron-minerals-folic acid, Vitamin D3, Ca Phosphate-Cholecalciferol (CALTRATE GUMMY BITES PO), Artificial Tear Ointment (DRY EYES OP), omeprazole, lisinopril-hydrochlorothiazide, COVID-19 mRNA Vac-TriS (Pfizer), famotidine, buPROPion, gabapentin, metoprolol succinate, venlafaxine XR, and albuterol.  Meds ordered this encounter  Medications   tirzepatide (MOUNJARO) 5 MG/0.5ML Pen    Sig: Inject 5 mg into the skin once a week.    Dispense:  2 mL    Refill:  1     Order Specific Question:   Supervising Provider    Answer:   Penni Homans A [[id number]]   diclofenac Sodium (VOLTAREN) 1 % GEL    Sig: Apply 2 g topically daily as needed.    Dispense:  100 g    Refill:  2    Order Specific Question:   Supervising Provider    Answer:   Penni Homans A [[id number]]   nystatin (MYCOSTATIN/NYSTOP) powder    Sig: Apply 1 application topically 2 (two) times daily.    Dispense:  60 g    Refill:  2    Order Specific Question:   Supervising Provider    Answer:   Penni Homans A [[id number]]   amLODipine (NORVASC) 10 MG tablet    Sig: Take 1 tablet (10 mg total) by mouth daily.    Dispense:  90 tablet    Refill:  1    Order Specific Question:  Supervising Provider    Answer:   Penni Homans A [[id number]]

## 2021-04-19 NOTE — Telephone Encounter (Signed)
Please advise pt that her bp was elevated at today's visit and I would like her to increase her amlodipine from 7.5mg  to 10mg  once daily. I sent 10mg  tabs to her pharmacy.

## 2021-04-19 NOTE — Telephone Encounter (Signed)
She is scheduled to come back in one month

## 2021-04-19 NOTE — Assessment & Plan Note (Signed)
Recommended trial of sparing use of voltaren gel to knees.  Weight loss will also help considerably with her pain.

## 2021-04-19 NOTE — Telephone Encounter (Signed)
Patient advised of provider's comments.  

## 2021-04-20 DIAGNOSIS — G4733 Obstructive sleep apnea (adult) (pediatric): Secondary | ICD-10-CM | POA: Diagnosis not present

## 2021-04-23 ENCOUNTER — Ambulatory Visit: Payer: Self-pay

## 2021-04-23 ENCOUNTER — Encounter: Payer: Self-pay | Admitting: Physical Medicine and Rehabilitation

## 2021-04-23 ENCOUNTER — Other Ambulatory Visit: Payer: Self-pay

## 2021-04-23 ENCOUNTER — Ambulatory Visit (INDEPENDENT_AMBULATORY_CARE_PROVIDER_SITE_OTHER): Payer: Medicare Other | Admitting: Gastroenterology

## 2021-04-23 ENCOUNTER — Encounter: Payer: Self-pay | Admitting: Gastroenterology

## 2021-04-23 ENCOUNTER — Ambulatory Visit (INDEPENDENT_AMBULATORY_CARE_PROVIDER_SITE_OTHER): Payer: Medicare Other | Admitting: Physical Medicine and Rehabilitation

## 2021-04-23 VITALS — BP 140/86 | HR 82 | Ht 64.0 in | Wt 322.0 lb

## 2021-04-23 DIAGNOSIS — R1310 Dysphagia, unspecified: Secondary | ICD-10-CM

## 2021-04-23 DIAGNOSIS — M25551 Pain in right hip: Secondary | ICD-10-CM | POA: Diagnosis not present

## 2021-04-23 DIAGNOSIS — K219 Gastro-esophageal reflux disease without esophagitis: Secondary | ICD-10-CM | POA: Diagnosis not present

## 2021-04-23 DIAGNOSIS — R1013 Epigastric pain: Secondary | ICD-10-CM | POA: Diagnosis not present

## 2021-04-23 NOTE — Progress Notes (Signed)
Pt state right hip pain. Pt state walking and laying down makes the pain worse. Pt state she uses pain cream to help ease her pain.  Numeric Pain Rating Scale and Functional Assessment Average Pain 7   In the last MONTH (on 0-10 scale) has pain interfered with the following?  1. General activity like being  able to carry out your everyday physical activities such as walking, climbing stairs, carrying groceries, or moving a chair?  Rating(10)   -BT, -Dye Allergies.

## 2021-04-23 NOTE — Progress Notes (Signed)
Hailey Vance    [id number]    1965-10-25  Primary Care Physician:O'Sullivan, Lenna Sciara, NP  Referring Physician: Debbrah Alar, NP [address] [place],   [address]   Chief complaint:  Dysphagia  HPI:  56 year old very pleasant female with morbid obesity, BMI greater than 55 here with complaints of worsening dysphagia  She feels the food gets hung up in her throat, experiences it with both solids and liquids.  She also is having epigastric discomfort.  No association with oral intake or bowel habits.  She is experiencing intermittent constipation which improves when she increases dietary intake of fiber with increased fruits and vegetables.  It is worse when she eats mostly meat.  Denies any episodes of food impaction.  No rectal bleeding or melena.  She recently started [facility] for weight loss.  She is experiencing postprandial fullness and early satiety since starting it.  Epigastric abdominal discomfort started prior to the medication.  Barium esophagram Jul 04, 2020 1. Apparent mild narrowing of the gastroesophageal junction,estimated at 9-10 mm. This along with esophageal dysmotility,reflected by tertiary contractions and barium stasis, led to barium stasis within the esophagus. 2. No mass. No evidence of esophagitis. No reflux documented during the exam.  Colonoscopy 08/30/2015: Normal    Outpatient Encounter Medications as of 04/23/2021  Medication Sig   albuterol (VENTOLIN HFA) 108 (90 Base) MCG/ACT inhaler Inhale 2 puffs into the lungs every 6 (six) hours as needed for wheezing or shortness of breath.   amLODipine (NORVASC) 10 MG tablet Take 1 tablet (10 mg total) by mouth daily.   Artificial Tear Ointment (DRY EYES OP) Apply to eye as needed.   buPROPion (WELLBUTRIN XL) 300 MG 24 hr tablet TAKE 1 TABLET BY MOUTH EVERY DAY   Ca Phosphate-Cholecalciferol (CALTRATE GUMMY BITES PO) Take by mouth daily.    Cholecalciferol (VITAMIN  D3) 50 MCG (2000 UT) CHEW Chew 8,000 Units by mouth daily.   COVID-19 mRNA Vac-TriS, Pfizer, SUSP injection Inject into the muscle.   diclofenac Sodium (VOLTAREN) 1 % GEL Apply 2 g topically daily as needed.   famotidine (PEPCID) 20 MG tablet Take 1 tablet (20 mg total) by mouth at bedtime.   gabapentin (NEURONTIN) 300 MG capsule Take 1 capsule (300 mg total) by mouth 3 (three) times daily.   lisinopril-hydrochlorothiazide (ZESTORETIC) 20-25 MG tablet Take 1 tablet by mouth once daily   Menthol, Topical Analgesic, 4 % GEL Apply 1 application topically daily.   metoprolol succinate (TOPROL-XL) 25 MG 24 hr tablet Take 1 tablet (25 mg total) by mouth daily. Take with or immediately following a meal   multivitamin-iron-minerals-folic acid (CENTRUM) chewable tablet Chew 1 tablet by mouth daily.   nystatin (MYCOSTATIN/NYSTOP) powder Apply 1 application topically 2 (two) times daily.   omeprazole (PRILOSEC) 40 MG capsule Take 1 capsule (40 mg total) by mouth daily.   tirzepatide [facility]) 5 MG/0.5ML Pen Inject 5 mg into the skin once a week.   venlafaxine XR (EFFEXOR-XR) 150 MG 24 hr capsule Take 1 capsule (150 mg total) by mouth daily with breakfast.   [DISCONTINUED] LISINOPRIL PO Take by mouth.   No facility-administered encounter medications on file as of 04/23/2021.    Allergies as of 04/23/2021 - Review Complete 04/23/2021  Allergen Reaction Noted   Penicillins Itching and Other (See Comments) 04/27/2011    Past Medical History:  Diagnosis Date   Bilateral primary osteoarthritis of knee 03/29/2019   Chronic  back pain    Cystocele with rectocele    Generalized anxiety disorder    GERD (gastroesophageal reflux disease)    Headache    otc med prn   Heart murmur    echo in epic 11-26-2017  normal w/ ef 55-60%   History of chest pain    pt referred for atyical chest pain , cardiology evaluation w/ dr berry note in epic 10-25-2014, stated with low risk w/ no ischemia , nuclear ef 70% , done  09-19-2014 in epic,  non-cardiac chest pain   HTN (hypertension)    followed by pcp    (normal nuclear stress test 09-19-2014 in epic)   Low blood potassium    Major depressive disorder    OSA (obstructive sleep apnea)    04/2015 study in e- Severe OSA (AHI = 55.0/hour);   (10-06-2019  per pt has not used cpap since 2019 after gastric bypass)   Osteoarthritis of both hips 06/15/2019   Plantar fasciitis of left foot    S/P gastric bypass 02/15/2018   sleeve   SUI (stress urinary incontinence, female)    Vitamin D deficiency 09/06/2014   Wears glasses     Past Surgical History:  Procedure Laterality Date   BLADDER SUSPENSION N/A 10/12/2019   Procedure: TRANSVAGINAL TAPE (TVT) PROCEDURE;  Surgeon: Bobbye Charleston, MD;  Location: [facility];  Service: Gynecology;  Laterality: N/A;   CESAREAN SECTION W/BTL  1990   COLONOSCOPY WITH PROPOFOL N/A 08/30/2015   Procedure: COLONOSCOPY WITH PROPOFOL;  Surgeon: Mauri Pole, MD;  [facility];  Service: Endoscopy;  Laterality: N/A;   CYSTOCELE REPAIR N/A 10/12/2019   Procedure: ANTERIOR (CYSTOCELE);  Surgeon: Bobbye Charleston, MD;  Location: [facility];  Service: Gynecology;  Laterality: N/A;   CYSTOSCOPY N/A 05/23/2015   Procedure: CYSTOSCOPY;  Surgeon: Bobbye Charleston, MD;  Location: [facility];  Service: Gynecology;  Laterality: N/A;   CYSTOSCOPY N/A 10/12/2019   Procedure: CYSTOSCOPY;  Surgeon: Bobbye Charleston, MD;  Location: [facility];  Service: Gynecology;  Laterality: N/A;   HIATAL HERNIA REPAIR N/A 02/15/2018   Procedure: HERNIA REPAIR HIATAL;  Surgeon: Excell Seltzer, MD;  Location[facility];  Service: General;  Laterality: N/A;   HYSTEROSCOPY WITH D & C N/A 01/11/2015   Procedure: DILATATION AND CURETTAGE /HYSTEROSCOPY;  Surgeon: Jerelyn Charles, MD;  Location: [facility];  Service: Gynecology;  Laterality: N/A;   KNEE ARTHROSCOPY Right 2010   LAPAROSCOPIC GASTRIC SLEEVE RESECTION  N/A 02/15/2018   Procedure: LAPAROSCOPIC GASTRIC SLEEVE RESECTION WITH UPPER ENDO AND ERAS PATHWAY;  Surgeon: Excell Seltzer, MD;  Location[facility];  Service: General;  Laterality: N/A;   LAPAROSCOPIC OOPHORECTOMY Bilateral 2004   LEFT OOPHORECTOMY AND PARTIAL RIGHT OOPHORECTOMY   LIPOMA EXCISION Left 12/02/2016   Procedure: EXCISION LIPOMA OF LEFT SHOULDER;  Surgeon: Garald Balding, MD;  Location: [facility];  Service: Orthopedics;  Laterality: Left;   ROBOTIC ASSISTED TOTAL HYSTERECTOMY WITH SALPINGECTOMY Bilateral 05/23/2015   Procedure: ROBOTIC ASSISTED TOTAL HYSTERECTOMY WITH SALPINGECTOMYwith right oophorectomy;  Surgeon: Bobbye Charleston, MD;  Location: [facility];  Service: Gynecology;  Laterality: Bilateral;   UMBILICAL HERNIA REPAIR  2002    Family History  Problem Relation Age of Onset   Lung cancer Mother 58       dec   Cancer Mother    Hypertension Mother    Arthritis Mother    Cancer Father 56       prostate   Hypertension Father  Leukemia Sister    Hypertension Sister    Arthritis Sister    Hyperlipidemia Sister    Hypertension Brother    Arthritis Brother    Multiple sclerosis Sister    Hypertension Sister    Hypertension Sister    Hypertension Brother    Hypertension Brother    Breast cancer Paternal Aunt    Alzheimer's disease Paternal Aunt    Diabetes Maternal Grandmother    Stroke Maternal Grandmother    Heart attack Paternal Grandmother    Asthma Other     Social History   Socioeconomic History   Marital status: Divorced    Spouse name: Not on file   Number of children: Not on file   Years of education: 12   Highest education level: High school graduate  Occupational History   Occupation: Disability  Tobacco Use   Smoking status: Never   Smokeless tobacco: Never  Vaping Use   Vaping Use: Never used  Substance and Sexual Activity   Alcohol use: No    Alcohol/week: 0.0 standard drinks   Drug use: Never   Sexual activity: Not on file     Comment: BTL w/ C/S in 1990  Other Topics Concern   Not on file  Social History Narrative   Divorced   Lives with son and grand-daughter   75- Son [name] (lives in [place])   [place] lives with patient   104- Daughter [name] (lives in [place])   Works at [employer]   Completed some college   Careers adviser   Social Determinants of Radio broadcast assistant Strain: Not on [employer] Insecurity: Not on file  Transportation Needs: Not on file  Physical Activity: Not on file  Stress: Not on file  Social Connections: Not on file  Intimate Partner Violence: Not on file      Review of systems: All other review of systems negative except as mentioned in the HPI.   Physical Exam: Vitals:   04/23/21 1027  BP: 140/86  Pulse: 82   Body mass index is 55.27 kg/m. Gen:      No acute distress HEENT:  sclera anicteric Abd:      soft, non-tender; no palpable masses, no distension Ext:    No edema Neuro: alert and oriented x 3 Psych: normal mood and affect  Data Reviewed:  Reviewed labs, radiology imaging, old records and pertinent past GI work up   Assessment and Plan/Recommendations:  56 year old very pleasant female with morbid obesity, BMI >55 with complaints of dysphagia and epigastric abdominal pain  Reviewed barium esophagram, evidence of peptic stricture and significant barium stasis.  No mass lesion or significant GERD was noted on exam Schedule for EGD at [facility] end[facility] center for esophageal dilation and biopsies as needed The risks and benefits as well as alternatives of endoscopic procedure(s) have been discussed and reviewed. All questions answered. The patient agrees to proceed.  Advised patient to chew food well, avoid dry tough meat or chunky bread until EGD  Epigastric abdominal pain: Obtain right upper quadrant ultrasound to exclude gallstones or cholecystitis We will also evaluate for erosive gastritis or  peptic ulcer disease during EGD  Due for colorectal cancer screening in 2027  Return in 3 months   The patient was provided an opportunity to ask questions and all were answered. The patient agreed with the plan and demonstrated an understanding of the instructions.  Damaris Hippo , MD  CC: Debbrah Alar, NP

## 2021-04-23 NOTE — Progress Notes (Signed)
° °  Hailey Vance - 56 y.o. female MRN [id number]  Date of birth: 31-Jan-1966  Office Visit Note: Visit Date: 04/23/2021 PCP: Debbrah Alar, NP Referred by: Debbrah Alar, NP  Subjective: Chief Complaint  Patient presents with   Right Hip - Pain   HPI:  Hailey Vance is a 56 y.o. female who comes in today for planned repeat Right anesthetic hip arthrogram with fluoroscopic guidance.  The patient has failed conservative care including home exercise, medications, time and activity modification. Prior injection gave more than 50% relief for several months. This injection will be diagnostic and hopefully therapeutic.  Please see requesting physician notes for further details and justification.  Referring: Dr. Joni Fears   ROS Otherwise per HPI.  Assessment & Plan: Visit Diagnoses:    ICD-10-CM   1. Pain in right hip  M25.551 Large Joint Inj: R hip joint    XR C-ARM NO REPORT      Plan: No additional findings.   Meds & Orders: No orders of the defined types were placed in this encounter.   Orders Placed This Encounter  Procedures   Large Joint Inj: R hip joint   XR C-ARM NO REPORT    Follow-up: Return for visit to requesting provider as needed.   Procedures: Large Joint Inj: R hip joint on 04/23/2021 9:39 AM Indications: diagnostic evaluation and pain Details: 22 G 3.5 in needle, fluoroscopy-guided anterior approach  Arthrogram: No  Medications: 4 mL bupivacaine 0.25 %; 60 mg triamcinolone acetonide 40 MG/ML Outcome: tolerated well, no immediate complications  There was excellent flow of contrast producing a partial arthrogram of the hip. The patient did have relief of symptoms during the anesthetic phase of the injection. Procedure, treatment alternatives, risks and benefits explained, specific risks discussed. Consent was given by the patient. Immediately prior to procedure a time out was called to verify the correct patient, procedure, equipment,  support staff and site/side marked as required. Patient was prepped and draped in the usual sterile fashion.         Clinical History: No specialty comments available.     Objective:  VS:  HT:     WT:    BMI:      BP:    HR: bpm   TEMP: ( )   RESP:  Physical Exam   Imaging: No results found.

## 2021-04-23 NOTE — Patient Instructions (Signed)
You have been scheduled for an endoscopy. Please follow written instructions given to you at your visit today. If you use inhalers (even only as needed), please bring them with you on the day of your procedure.    At [place] have been scheduled for an abdominal ultrasound/ RUQ Ultrasound at [facility] (1st floor of hospital) on 04/26/2021 at 10:30am. Please arrive 30 minutes prior to your appointment for registration. Make certain not to have anything to eat or drink 8 hours prior to your appointment. Should you need to reschedule your appointment, please contact radiology at [phone]. This test typically takes about 30 minutes to perform.   If you are age 63 or older, your body mass index should be between 23-30. Your Body mass index is 55.27 kg/m. If this is out of the aforementioned range listed, please consider follow up with your Primary Care Provider.  If you are age 51 or younger, your body mass index should be between 19-25. Your Body mass index is 55.27 kg/m. If this is out of the aformentioned range listed, please consider follow up with your Primary Care Provider.   ________________________________________________________  The [facility] providers would like to encourage you to use [facility] to communicate with providers for non-urgent requests or questions.  Due to long hold times on the telephone, sending your provider a message by [facility] may be a faster and more efficient way to get a response.  Please allow 48 business hours for a response.  Please remember that this is for non-urgent requests.   Due to recent changes in healthcare laws, you may see the results of your imaging and laboratory studies on MyChart before your provider has had a chance to review them.  We understand that in some cases there may be results that are confusing or concerning to you. Not all laboratory results come back in the same time frame and the provider may be waiting for  multiple results in order to interpret others.  Please give [place] 48 hours in order for your provider to thoroughly review all the results before contacting the office for clarification of your results.    I appreciate the  opportunity to care for you  Thank You   Harl Bowie , MD  _______________________________________________________

## 2021-04-23 NOTE — Patient Instructions (Signed)
Good pain management providers in [facility] with comprehensive management.  1. [facility]: Alysia Penna, MD, Alger Simons, MD and Ankit Patel,MD  2. [facility]: Nicholaus Bloom, MD and Margaretha Sheffield, MD  3. [facility]: all locations  4. Mohammed Kindle, MD [facility] office .

## 2021-04-26 ENCOUNTER — Ambulatory Visit (HOSPITAL_COMMUNITY)
Admission: RE | Admit: 2021-04-26 | Discharge: 2021-04-26 | Disposition: A | Discharge status: Home / Self Care | Payer: Medicare Other | Source: Ambulatory Visit | Attending: Gastroenterology | Admitting: Gastroenterology

## 2021-04-26 ENCOUNTER — Other Ambulatory Visit: Payer: Self-pay

## 2021-04-26 DIAGNOSIS — N281 Cyst of kidney, acquired: Secondary | ICD-10-CM | POA: Diagnosis not present

## 2021-04-26 DIAGNOSIS — R1013 Epigastric pain: Secondary | ICD-10-CM | POA: Insufficient documentation

## 2021-04-26 DIAGNOSIS — R1310 Dysphagia, unspecified: Secondary | ICD-10-CM | POA: Insufficient documentation

## 2021-04-26 DIAGNOSIS — K219 Gastro-esophageal reflux disease without esophagitis: Secondary | ICD-10-CM | POA: Diagnosis not present

## 2021-05-02 MED ORDER — BUPIVACAINE HCL 0.25 % IJ SOLN
4.0000 mL | INTRAMUSCULAR | Status: AC | PRN
  Administered 2021-04-23: 4 mL via INTRA_ARTICULAR

## 2021-05-02 MED ORDER — TRIAMCINOLONE ACETONIDE 40 MG/ML IJ SUSP
60.0000 mg | INTRAMUSCULAR | Status: AC | PRN
  Administered 2021-04-23: 60 mg via INTRA_ARTICULAR

## 2021-05-17 ENCOUNTER — Ambulatory Visit: Payer: Medicare Other | Admitting: Family

## 2021-05-18 DIAGNOSIS — G4733 Obstructive sleep apnea (adult) (pediatric): Secondary | ICD-10-CM | POA: Diagnosis not present

## 2021-05-22 ENCOUNTER — Ambulatory Visit: Payer: Medicare Other | Admitting: Family

## 2021-06-11 ENCOUNTER — Ambulatory Visit (INDEPENDENT_AMBULATORY_CARE_PROVIDER_SITE_OTHER): Payer: Medicare Other | Admitting: Family

## 2021-06-11 ENCOUNTER — Other Ambulatory Visit: Payer: Self-pay | Admitting: Family

## 2021-06-11 VITALS — BP 150/79 | HR 84 | Temp 98.6°F | Resp 16 | Wt 325.0 lb

## 2021-06-11 DIAGNOSIS — I10 Essential (primary) hypertension: Secondary | ICD-10-CM | POA: Diagnosis not present

## 2021-06-11 DIAGNOSIS — R29898 Other symptoms and signs involving the musculoskeletal system: Secondary | ICD-10-CM | POA: Diagnosis not present

## 2021-06-11 DIAGNOSIS — M5416 Radiculopathy, lumbar region: Secondary | ICD-10-CM

## 2021-06-11 MED ORDER — FUROSEMIDE 20 MG PO TABS: 20.0000 mg | ORAL_TABLET | Freq: Every day | ORAL | 3 refills | Status: DC | PRN

## 2021-06-11 MED ORDER — TIRZEPATIDE 7.5 MG/0.5ML ~~LOC~~ SOAJ: 7.5000 mg | SUBCUTANEOUS | 0 refills | Status: DC

## 2021-06-11 MED ORDER — LISINOPRIL 20 MG PO TABS: 20.0000 mg | ORAL_TABLET | Freq: Every day | ORAL | 1 refills | Status: DC

## 2021-06-11 NOTE — Progress Notes (Signed)
? ?Subjective:  ? ?By signing my name below, I, Hailey Vance, attest that this documentation has been prepared under the direction and in the presence of Hailey Alar NP, 06/11/2021 ? ? Patient ID: Hailey Vance, female    DOB: Jan 29, 1966, 56 y.o.   MRN: [id number] ? ?Chief Complaint  ?Patient presents with  ? Morbid obesigy  ?  Here for follow up  ? Hypertension  ?  Here for follow up  ? ? ?HPI ?Patient is in today for an office visit. ? ?Back Pain - Patient complains of lower back pain that radiates downward into her left foot. Pain in her right lower leg begun as of a few days ago. She describes the pain as if "something is inside of her leg and twisting her bones". The pain tends to run over her toes making doing activities difficult for her. The pain is inconsistent. She is wary of doing physical therapy because she has not have had good experiences with the activity. She takes 300 MG of Gabapentin and Acetaminophen every 7 hours. ? ?We referred her to PT for the same last year and she did not experience any improvement with PT.  ? ?Weight - Her weight has been increasing. She has been taking 5 MG of Mounjaro once a week. She thinks that swelling is part of the reason her weight is up.   ?Wt Readings from Last 3 Encounters:  ?06/11/21 (!) 325 lb (147.4 kg)  ?04/23/21 (!) 322 lb (146.1 kg)  ?04/19/21 (!) 323 lb 9.6 oz (146.8 kg)  ? ? ?Blood Pressure - She has not been taking 20 - 25 MG of Zestoretic since the medication causes her frequent urination. She also states that her legs have been swelling.  ?Wt Readings from Last 3 Encounters:  ?06/11/21 (!) 325 lb (147.4 kg)  ?04/23/21 (!) 322 lb (146.1 kg)  ?04/19/21 (!) 323 lb 9.6 oz (146.8 kg)  ? ? ?Health Maintenance Due  ?Topic Date Due  ? Zoster Vaccines- Shingrix (1 of 2) Never done  ? COVID-19 Vaccine (2 - Pfizer series) 09/12/2020  ? PAP SMEAR-Modifier  11/20/2020  ? ? ?Past Medical History:  ?Diagnosis Date  ? Bilateral primary osteoarthritis of  knee 03/29/2019  ? Chronic back pain   ? Cystocele with rectocele   ? Generalized anxiety disorder   ? GERD (gastroesophageal reflux disease)   ? Headache   ? otc med prn  ? Heart murmur   ? echo in epic 11-26-2017  normal w/ ef 55-60%  ? History of chest pain   ? pt referred for atyical chest pain , cardiology evaluation w/ dr berry note in epic 10-25-2014, stated with low risk w/ no ischemia , nuclear ef 70% , done 09-19-2014 in epic,  non-cardiac chest pain  ? HTN (hypertension)   ? followed by pcp    (normal nuclear stress test 09-19-2014 in epic)  ? Low blood potassium   ? Major depressive disorder   ? OSA (obstructive sleep apnea)   ? 04/2015 study in e- Severe OSA (AHI = 55.0/hour);   (10-06-2019  per pt has not used cpap since 2019 after gastric bypass)  ? Osteoarthritis of both hips 06/15/2019  ? Plantar fasciitis of left foot   ? S/P gastric bypass 02/15/2018  ? sleeve  ? SUI (stress urinary incontinence, female)   ? Vitamin D deficiency 09/06/2014  ? Wears glasses   ? ? ?Past Surgical History:  ?Procedure Laterality Date  ? BLADDER SUSPENSION  N/A 10/12/2019  ? Procedure: TRANSVAGINAL TAPE (TVT) PROCEDURE;  Surgeon: Bobbye Charleston, MD;  Location: [facility];  Service: Gynecology;  Laterality: N/A;  ? CESAREAN SECTION W/BTL  1990  ? COLONOSCOPY WITH PROPOFOL N/A 08/30/2015  ? Procedure: COLONOSCOPY WITH PROPOFOL;  Surgeon: Mauri Pole, MD;  Location: [facility];  Service: Endoscopy;  Laterality: N/A;  ? CYSTOCELE REPAIR N/A 10/12/2019  ? Procedure: ANTERIOR (CYSTOCELE);  Surgeon: Bobbye Charleston, MD;  Location: [facility];  Service: Gynecology;  Laterality: N/A;  ? CYSTOSCOPY N/A 05/23/2015  ? Procedure: CYSTOSCOPY;  Surgeon: Bobbye Charleston, MD;  Location: [facility];  Service: Gynecology;  Laterality: N/A;  ? CYSTOSCOPY N/A 10/12/2019  ? Procedure: CYSTOSCOPY;  Surgeon: Bobbye Charleston, MD;  Location: [facility];  Service: Gynecology;  Laterality: N/A;   ? HIATAL HERNIA REPAIR N/A 02/15/2018  ? Procedure: HERNIA REPAIR HIATAL;  Surgeon: Excell Seltzer, MD;  Location: [facility];  Service: General;  Laterality: N/A;  ? HYSTEROSCOPY WITH D & C N/A 01/11/2015  ? Procedure: DILATATION AND CURETTAGE /HYSTEROSCOPY;  Surgeon: Jerelyn Charles, MD;  Location: [facility];  Service: Gynecology;  Laterality: N/A;  ? KNEE ARTHROSCOPY Right 2010  ? LAPAROSCOPIC GASTRIC SLEEVE RESECTION N/A 02/15/2018  ? Procedure: LAPAROSCOPIC GASTRIC SLEEVE RESECTION WITH UPPER ENDO AND ERAS PATHWAY;  Surgeon: Excell Seltzer, MD;  Location: [facility];  Service: General;  Laterality: N/A;  ? LAPAROSCOPIC OOPHORECTOMY Bilateral 2004  ? LEFT OOPHORECTOMY AND PARTIAL RIGHT OOPHORECTOMY  ? LIPOMA EXCISION Left 12/02/2016  ? Procedure: EXCISION LIPOMA OF LEFT SHOULDER;  Surgeon: Garald Balding, MD;  Location: [facility];  Service: Orthopedics;  Laterality: Left;  ? ROBOTIC ASSISTED TOTAL HYSTERECTOMY WITH SALPINGECTOMY Bilateral 05/23/2015  ? Procedure: ROBOTIC ASSISTED TOTAL HYSTERECTOMY WITH SALPINGECTOMYwith right oophorectomy;  Surgeon: Bobbye Charleston, MD;  Location: [facility];  Service: Gynecology;  Laterality: Bilateral;  ? UMBILICAL HERNIA REPAIR  2002  ? ? ?Family History  ?Problem Relation Age of Onset  ? Lung cancer Mother 32  ?     dec  ? Cancer Mother   ? Hypertension Mother   ? Arthritis Mother   ? Cancer Father 44  ?     prostate  ? Hypertension Father   ? Leukemia Sister   ? Hypertension Sister   ? Arthritis Sister   ? Hyperlipidemia Sister   ? Hypertension Brother   ? Arthritis Brother   ? Multiple sclerosis Sister   ? Hypertension Sister   ? Hypertension Sister   ? Hypertension Brother   ? Hypertension Brother   ? Breast cancer Paternal Aunt   ? Alzheimer's disease Paternal Aunt   ? Diabetes Maternal Grandmother   ? Stroke Maternal Grandmother   ? Heart attack Paternal Grandmother   ? Asthma Other   ? ? ?Social History  ? ?Socioeconomic History  ? Marital status: Divorced  ?  Spouse name: Not  on file  ? Number of children: Not on file  ? Years of education: 77  ? Highest education level: High school graduate  ?Occupational History  ? Occupation: Disability  ?Tobacco Use  ? Smoking status: Never  ? Smokeless tobacco: Never  ?Vaping Use  ? Vaping Use: Never used  ?Substance and Sexual Activity  ? Alcohol use: No  ?  Alcohol/week: 0.0 standard drinks  ? Drug use: Never  ? Sexual activity: Not on file  ?  Comment: BTL w/ C/S in 1990  ?Other Topics Concern  ? Not on file  ?  Social History Narrative  ? Divorced  ? Lives with son and grand-daughter  ? Ripley (lives in [place])  ? [place] lives with patient  ? 32- Daughter [name] (lives in [place])  ? Works at [employer] center  ? Completed some college  ? 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 ?Social Determinants of Health  ? ?Financial Resource Strain: Not on file  ?Food Insecurity: Not on file  ?Transportation Needs: Not on file  ?Physical Activity: Not on file  ?Stress: Not on file  ?Social Connections: Not on file  ?Intimate Partner Violence: Not on file  ? ? ?Outpatient Medications Prior to Visit  ?Medication Sig Dispense Refill  ? albuterol (VENTOLIN HFA) 108 (90 Base) MCG/ACT inhaler INHALE 2 PUFFS BY MOUTH EVERY 6 HOURS AS NEEDED FOR WHEEZING FOR SHORTNESS OF BREATH 9 g 0  ? amLODipine (NORVASC) 10 MG tablet Take 1 tablet (10 mg total) by mouth daily. 90 tablet 1  ? Artificial Tear Ointment (DRY EYES OP) Apply to eye as needed.    ? buPROPion (WELLBUTRIN XL) 300 MG 24 hr tablet TAKE 1 TABLET BY MOUTH EVERY DAY 90 tablet 1  ? Ca Phosphate-Cholecalciferol (CALTRATE GUMMY BITES PO) Take by mouth daily.     ? Cholecalciferol (VITAMIN D3) 50 MCG (2000 UT) CHEW Chew 8,000 Units by mouth daily.    ? COVID-19 mRNA Vac-TriS, Pfizer, SUSP injection Inject into the muscle. 0.3 mL 0  ? diclofenac Sodium (VOLTAREN) 1 % GEL Apply 2 g topically daily as needed. 100 g 2  ? famotidine (PEPCID) 20 MG tablet Take 1 tablet (20 mg total) by mouth at  bedtime.    ? gabapentin (NEURONTIN) 300 MG capsule Take 1 capsule (300 mg total) by mouth 3 (three) times daily. 90 capsule 3  ? Menthol, Topical Analgesic, 4 % GEL Apply 1 application topically daily.

## 2021-06-11 NOTE — Assessment & Plan Note (Addendum)
Wt Readings from Last 3 Encounters:  ?06/11/21 (!) 325 lb (147.4 kg)  ?04/23/21 (!) 322 lb (146.1 kg)  ?04/19/21 (!) 323 lb 9.6 oz (146.8 kg)  ? ?Continue moujaro, but will increase dose to 7.'5mg'$ .  ?

## 2021-06-11 NOTE — Patient Instructions (Signed)
We will work on getting the MRI approved. ?Stop lisinopril-hctz. ?Start lisinopril '20mg'$ . ?Start furosemide '20mg'$  once daily (only as needed) for swelling.  ?Increase mounjaro from '5mg'$  to 7.'5mg'$  once daily.  ?

## 2021-06-11 NOTE — Assessment & Plan Note (Signed)
Will d/c zestoretic because pt is not taking due to diuretic.  Start lisinopril '20mg'$  only.  Will add furosemide for prn use.  ?

## 2021-06-11 NOTE — Assessment & Plan Note (Signed)
I am concerned about her c/o LE weakness and ongoing radicular symptoms despite PT. Will order MRI Lumbar spine.  ?

## 2021-06-18 ENCOUNTER — Ambulatory Visit (HOSPITAL_COMMUNITY)
Admission: RE | Admit: 2021-06-18 | Discharge: 2021-06-18 | Disposition: A | Discharge status: Home / Self Care | Payer: Medicare Other | Source: Ambulatory Visit | Attending: Family | Admitting: Family

## 2021-06-18 ENCOUNTER — Encounter (HOSPITAL_COMMUNITY): Payer: Self-pay | Admitting: Gastroenterology

## 2021-06-18 ENCOUNTER — Ambulatory Visit: Payer: Medicare Other | Admitting: Pulmonary Disease

## 2021-06-18 DIAGNOSIS — M4726 Other spondylosis with radiculopathy, lumbar region: Secondary | ICD-10-CM | POA: Diagnosis not present

## 2021-06-18 DIAGNOSIS — R29898 Other symptoms and signs involving the musculoskeletal system: Secondary | ICD-10-CM | POA: Diagnosis not present

## 2021-06-18 DIAGNOSIS — M5416 Radiculopathy, lumbar region: Secondary | ICD-10-CM | POA: Diagnosis not present

## 2021-06-18 DIAGNOSIS — M5106 Intervertebral disc disorders with myelopathy, lumbar region: Secondary | ICD-10-CM | POA: Diagnosis not present

## 2021-06-18 NOTE — Progress Notes (Signed)
Attempted to obtain medical history via telephone, unable to reach at this time. Unable to leave voicemail to return pre surgical testing department's phone call,due to mailbox full.  

## 2021-06-19 ENCOUNTER — Telehealth: Payer: Self-pay | Admitting: Family

## 2021-06-19 DIAGNOSIS — M545 Low back pain, unspecified: Secondary | ICD-10-CM

## 2021-06-19 DIAGNOSIS — N281 Cyst of kidney, acquired: Secondary | ICD-10-CM

## 2021-06-19 NOTE — Telephone Encounter (Signed)
MRI shows some degenerative changes.  I would like to refer her to neurosurgery. Also, the radiologist saw some renal cysts that I would like to take a closer look at with ultrasound. [place] order placed.  ?

## 2021-06-20 ENCOUNTER — Telehealth (HOSPITAL_BASED_OUTPATIENT_CLINIC_OR_DEPARTMENT_OTHER): Payer: Self-pay

## 2021-06-20 NOTE — Telephone Encounter (Signed)
Patient advised of results and referrals, she will be aware of getting phone call from neurosurgery and radiology. ?

## 2021-06-21 ENCOUNTER — Telehealth (HOSPITAL_BASED_OUTPATIENT_CLINIC_OR_DEPARTMENT_OTHER): Payer: Self-pay

## 2021-06-21 ENCOUNTER — Encounter: Payer: Self-pay | Admitting: Family

## 2021-06-21 MED ORDER — OMEPRAZOLE 40 MG PO CPDR: 40.0000 mg | DELAYED_RELEASE_CAPSULE | Freq: Every day | ORAL | 1 refills | Status: DC

## 2021-06-21 NOTE — Telephone Encounter (Signed)
Gwen, can you please forward referral to Dr. Rennis Harding? tks ?

## 2021-06-24 ENCOUNTER — Telehealth: Payer: Self-pay | Admitting: Gastroenterology

## 2021-06-24 NOTE — Telephone Encounter (Signed)
Called the patient back to discuss. No answer. Mailbox is full and cannot accept messages. ?Her EGD is scheduled for 8:15 at [place]. To reschedule will push out to July or August.  ?

## 2021-06-24 NOTE — Telephone Encounter (Signed)
Patient called to cancel procedure scheduled for [facility] tomorrow.  She does not have anyone who can provide her a ride or stay with her.  Please call her back to reschedule.  Thank you. ?

## 2021-06-25 ENCOUNTER — Encounter (HOSPITAL_COMMUNITY): Admission: RE | Payer: Self-pay | Source: Home / Self Care

## 2021-06-25 ENCOUNTER — Ambulatory Visit (HOSPITAL_COMMUNITY): Admission: RE | Admit: 2021-06-25 | Payer: Medicare Other | Source: Home / Self Care | Admitting: Gastroenterology

## 2021-06-25 ENCOUNTER — Encounter (HOSPITAL_COMMUNITY): Payer: Self-pay | Admitting: Anesthesiology

## 2021-06-25 SURGERY — ESOPHAGOGASTRODUODENOSCOPY (EGD) WITH PROPOFOL
Anesthesia: Monitor Anesthesia Care

## 2021-06-25 NOTE — Telephone Encounter (Signed)
Got it, thanks for checking ?

## 2021-06-25 NOTE — Progress Notes (Signed)
[facility])  ?                                          [facility] Quality Pharmacy Team  ?                                      Statin Quality Measure Assessment ?  ? ?06/25/2021 ? ?Hailey Vance ?[birth year] ?[id number] ? ?Per review of chart and payor information, this patient has been flagged for non-adherence to the following CMS Quality Measure:  ? '[x]'$  Statin Use in Persons with Diabetes ? - This patient is failing SUPD as a result of being on Mounjaro. Per chart review, no hemoglobin A1c is available to assess. LDL 93 mg/dL as of 08/2020. Next appointment with PCP is on 06/26/2021. If clinically appropriate, please consider checking an updated lipid panel and hemoglobin A1c to assess for statin therapy. Additionally, if an exclusion code is applicable, please consider associating an exclusion code (see options below).  ? ? '[]'$  Statin Use in Persons with Cardiovascular Disease ? ?The 10-year ASCVD risk score (Arnett DK, et al., 2019) is: 6.7% ?  Values used to calculate the score: ?    Age: 56 years ?    Sex: Female ?    Is Non-Hispanic African American: Yes ?    Diabetic: No ?    Tobacco smoker: No ?    Systolic Blood Pressure: 009 mmHg ?    Is BP treated: Yes ?    HDL Cholesterol: 83 mg/dL ?    Total Cholesterol: 199 mg/dL ? ? ? ?Please consider ONE of the following recommendations:  ? ?Initiate high intensity statin Atorvastatin '40mg'$  once daily, #90, 3 refills  ? Rosuvastatin '20mg'$  once daily, #90, 3 refills  ?  ?Initiate moderate intensity  ?        statin with reduced frequency if prior  ?        statin intolerance 1x weekly, #13, 3 refills  ? 2x weekly, #26, 3 refills  ? 3x weekly, #39, 3 refills  ? ?Code for past statin intolerance or other exclusions (required annually) ? Drug Induced Myopathy G72.0  ? Myositis, unspecified M60.9  ? Rhabdomyolysis M62.82  ? Cirrhosis of liver K74.69  ? Biliary cirrhosis, unspecified K74.5  ? Abnormal blood glucose - for  SUPD ONLY R73.09  ? Prediabetes - for SUPD ONLY  R73.03  ? Polycystic ovarian syndrome E28.2  ?    ? ?Thank you for your time, ? ?Kristeen Miss, PharmD ?Clinical Pharmacist ?[facility] ?Cell: [phone]  ? ?

## 2021-06-25 NOTE — Anesthesia Preprocedure Evaluation (Deleted)
Anesthesia Evaluation  ? ? ?Reviewed: ?Allergy & Precautions, Patient's Chart, lab work & pertinent test results, reviewed documented beta blocker date and time  ? ?History of Anesthesia Complications ?Negative for: history of anesthetic complications ? ?Airway ? ? ? ? ? ? ? Dental ?  ?Pulmonary ?sleep apnea ,  ?  ? ? ? ? ? ? ? Cardiovascular ?hypertension, Pt. on medications and Pt. on home beta blockers ? ? ? ?  ?Neuro/Psych ? Headaches, Anxiety Depression   ? GI/Hepatic ?Neg liver ROS, GERD  ,  ?Endo/Other  ?Morbid obesity (BMI 56) ? Renal/GU ?negative Renal ROS  ?negative genitourinary ?  ?Musculoskeletal ? ?(+) Arthritis ,  ? Abdominal ?  ?Peds ? Hematology ?negative hematology ROS ?(+)   ?Anesthesia Other Findings ?Day of surgery medications reviewed with patient. ? Reproductive/Obstetrics ?negative OB ROS ? ?  ? ? ? ? ? ? ? ? ? ? ? ? ? ?  ?  ? ? ? ? ? ? ? ? ?Anesthesia Physical ?Anesthesia Plan ? ?ASA: 3 ? ?Anesthesia Plan: MAC  ? ?Post-op Pain Management: Minimal or no pain anticipated  ? ?Induction:  ? ?PONV Risk Score and Plan: Treatment may vary due to age or medical condition and Propofol infusion ? ?Airway Management Planned: Natural Airway and Nasal Cannula ? ?Additional Equipment: None ? ?Intra-op Plan:  ? ?Post-operative Plan:  ? ?Informed Consent:  ? ?Plan Discussed with:  ? ?Anesthesia Plan Comments:   ? ? ? ? ? ? ?Anesthesia Quick Evaluation ? ?

## 2021-06-26 ENCOUNTER — Ambulatory Visit (INDEPENDENT_AMBULATORY_CARE_PROVIDER_SITE_OTHER): Payer: Medicare Other | Admitting: Family

## 2021-06-26 VITALS — BP 151/83 | HR 87 | Temp 98.7°F | Resp 16 | Wt 324.0 lb

## 2021-06-26 DIAGNOSIS — I10 Essential (primary) hypertension: Secondary | ICD-10-CM

## 2021-06-26 DIAGNOSIS — G8929 Other chronic pain: Secondary | ICD-10-CM

## 2021-06-26 DIAGNOSIS — N281 Cyst of kidney, acquired: Secondary | ICD-10-CM

## 2021-06-26 DIAGNOSIS — M5442 Lumbago with sciatica, left side: Secondary | ICD-10-CM

## 2021-06-26 DIAGNOSIS — M5441 Lumbago with sciatica, right side: Secondary | ICD-10-CM

## 2021-06-26 DIAGNOSIS — R300 Dysuria: Secondary | ICD-10-CM

## 2021-06-26 LAB — BASIC METABOLIC PANEL
BUN: 13 mg/dL (ref 6–23)
CO2: 30 mEq/L (ref 19–32)
Calcium: 9.2 mg/dL (ref 8.4–10.5)
Chloride: 103 mEq/L (ref 96–112)
Creatinine, Ser: 1.01 mg/dL (ref 0.40–1.20)
GFR: 61.86 mL/min (ref >60.00)
Glucose, Bld: 91 mg/dL (ref 70–99)
Potassium: 4.4 mEq/L (ref 3.5–5.1)
Sodium: 140 mEq/L (ref 135–145)

## 2021-06-26 MED ORDER — METOPROLOL SUCCINATE ER 50 MG PO TB24: 50.0000 mg | ORAL_TABLET | Freq: Every day | ORAL | 1 refills | Status: DC

## 2021-06-26 NOTE — Patient Instructions (Signed)
Please complete lab work prior to leaving.   

## 2021-06-26 NOTE — Assessment & Plan Note (Signed)
Incidental finding on MRI. She will schedule renal US for further evaluation.  ?

## 2021-06-26 NOTE — Assessment & Plan Note (Signed)
BP Readings from Last 3 Encounters:  ?06/26/21 (!) 151/83  ?06/11/21 (!) 150/79  ?04/23/21 140/86  ? ?Still above goal. Advised pt to increase toprol xl from '25mg'$  to '50mg'$ .  Continue lisinopril.   ?

## 2021-06-26 NOTE — Progress Notes (Signed)
? ?Subjective:  ? ?By signing my name below, I, Joaquin Music, attest that this documentation has been prepared under the direction and in the presence of Debbrah Alar, NP 06/26/2021   ?   ? ? Patient ID: Hailey Vance, female    DOB: [birth year], 56 y.o.   MRN: [id number] ? ?Chief Complaint  ?Patient presents with  ? Hypertension  ?  Here for follow up  ? ? ?HPI ?Patient is in today for an office visit and 2 week f/u ? ?Hypertension- Her blood pressure is elevated at today's visit. ?BP Readings from Last 3 Encounters:  ?06/26/21 (!) 151/83  ?06/11/21 (!) 150/79  ?04/23/21 140/86  ?  ?Swelling- She reports she has been taking lasix once every 3 days. Notes that the first time she took it, nothing happened but after the second time, she was urinating frequently. She thought it was a bladder infection because there was dysuria and discomfort. No signs of hematuria. She thinks her swelling is less compared to her last visit. ? ?Back pain- Last MRI showed arthritis in her lumbar spine.  ? ?Leg pain- She is wondering about the intermittent leg pain in her left calf. Notes that pain is brought on by sudden movements and is not persistent. She will be going to see a neurosurgeon ? ?Past Medical History:  ?Diagnosis Date  ? Bilateral primary osteoarthritis of knee 03/29/2019  ? Chronic back pain   ? Cystocele with rectocele   ? Generalized anxiety disorder   ? GERD (gastroesophageal reflux disease)   ? Headache   ? otc med prn  ? Heart murmur   ? echo in epic 11-26-2017  normal w/ ef 55-60%  ? History of chest pain   ? pt referred for atyical chest pain , cardiology evaluation w/ dr berry note in epic 10-25-2014, stated with low risk w/ no ischemia , nuclear ef 70% , done 09-19-2014 in epic,  non-cardiac chest pain  ? HTN (hypertension)   ? followed by pcp    (normal nuclear stress test 09-19-2014 in epic)  ? Low blood potassium   ? Major depressive disorder   ? OSA (obstructive sleep apnea)   ? 04/2015 study in e-  Severe OSA (AHI = 55.0/hour);   (10-06-2019  per pt has not used cpap since 2019 after gastric bypass)  ? Osteoarthritis of both hips 06/15/2019  ? Plantar fasciitis of left foot   ? S/P gastric bypass 02/15/2018  ? sleeve  ? SUI (stress urinary incontinence, female)   ? Vitamin D deficiency 09/06/2014  ? Wears glasses   ? ? ?Past Surgical History:  ?Procedure Laterality Date  ? BLADDER SUSPENSION N/A 10/12/2019  ? Procedure: TRANSVAGINAL TAPE (TVT) PROCEDURE;  Surgeon: Bobbye Charleston, MD;  [facility];  Service: Gynecology;  Laterality: N/A;  ? CESAREAN SECTION W/BTL  1990  ? COLONOSCOPY WITH PROPOFOL N/A 08/30/2015  ? Procedure: COLONOSCOPY WITH PROPOFOL;  Surgeon: Mauri Pole, MD;  Location: [facility];  Service: Endoscopy;  Laterality: N/A;  ? CYSTOCELE REPAIR N/A 10/12/2019  ? Procedure: ANTERIOR (CYSTOCELE);  Surgeon: Bobbye Charleston, MD;  Location: [facility];  Service: Gynecology;  Laterality: N/A;  ? CYSTOSCOPY N/A 05/23/2015  ? Procedure: CYSTOSCOPY;  Surgeon: Bobbye Charleston, MD;  Location: [facility];  Service: Gynecology;  Laterality: N/A;  ? CYSTOSCOPY N/A 10/12/2019  ? Procedure: CYSTOSCOPY;  Surgeon: Bobbye Charleston, MD;  Location: [facility];  Service: Gynecology;  Laterality: N/A;  ?  HIATAL HERNIA REPAIR N/A 02/15/2018  ? Procedure: HERNIA REPAIR HIATAL;  Surgeon: Excell Seltzer, MD;  Location: [facility];  Service: General;  Laterality: N/A;  ? HYSTEROSCOPY WITH D & C N/A 01/11/2015  ? Procedure: DILATATION AND CURETTAGE /HYSTEROSCOPY;  Surgeon: Jerelyn Charles, MD;  Location: [facility];  Service: Gynecology;  Laterality: N/A;  ? KNEE ARTHROSCOPY Right 2010  ? LAPAROSCOPIC GASTRIC SLEEVE RESECTION N/A 02/15/2018  ? Procedure: LAPAROSCOPIC GASTRIC SLEEVE RESECTION WITH UPPER ENDO AND ERAS PATHWAY;  Surgeon: Excell Seltzer, MD;  Location: [facility];  Service: General;  Laterality: N/A;  ? LAPAROSCOPIC OOPHORECTOMY Bilateral 2004  ? LEFT  OOPHORECTOMY AND PARTIAL RIGHT OOPHORECTOMY  ? LIPOMA EXCISION Left 12/02/2016  ? Procedure: EXCISION LIPOMA OF LEFT SHOULDER;  Surgeon: Garald Balding, MD;  Location: [facility];  Service: Orthopedics;  Laterality: Left;  ? ROBOTIC ASSISTED TOTAL HYSTERECTOMY WITH SALPINGECTOMY Bilateral 05/23/2015  ? Procedure: ROBOTIC ASSISTED TOTAL HYSTERECTOMY WITH SALPINGECTOMYwith right oophorectomy;  Surgeon: Bobbye Charleston, MD;  Location: [facility];  Service: Gynecology;  Laterality: Bilateral;  ? UMBILICAL HERNIA REPAIR  2002  ? ? ?Family History  ?Problem Relation Age of Onset  ? Lung cancer Mother 81  ?     dec  ? Cancer Mother   ? Hypertension Mother   ? Arthritis Mother   ? Cancer Father 32  ?     prostate  ? Hypertension Father   ? Leukemia Sister   ? Hypertension Sister   ? Arthritis Sister   ? Hyperlipidemia Sister   ? Hypertension Brother   ? Arthritis Brother   ? Multiple sclerosis Sister   ? Hypertension Sister   ? Hypertension Sister   ? Hypertension Brother   ? Hypertension Brother   ? Breast cancer Paternal Aunt   ? Alzheimer's disease Paternal Aunt   ? Diabetes Maternal Grandmother   ? Stroke Maternal Grandmother   ? Heart attack Paternal Grandmother   ? Asthma Other   ? ? ?Social History  ? ?Socioeconomic History  ? Marital status: Divorced  ?  Spouse name: Not on file  ? Number of children: Not on file  ? Years of education: 71  ? Highest education level: High school graduate  ?Occupational History  ? Occupation: Disability  ?Tobacco Use  ? Smoking status: Never  ? Smokeless tobacco: Never  ?Vaping Use  ? Vaping Use: Never used  ?Substance and Sexual Activity  ? Alcohol use: No  ?  Alcohol/week: 0.0 standard drinks  ? Drug use: Never  ? Sexual activity: Not on file  ?  Comment: BTL w/ C/S in 1990  ?Other Topics Concern  ? Not on file  ?Social History Narrative  ? Divorced  ? Lives with son and grand-daughter  ? [place] (lives in [place])  ? [place] lives with patient  ? 41- Daughter [name]  (lives in [place])  ? Works at [employer] center  ? Completed some college  ? 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 ?Social Determinants of Health  ? ?Financial Resource Strain: Not on file  ?Food Insecurity: Not on file  ?Transportation Needs: Not on file  ?Physical Activity: Not on file  ?Stress: Not on file  ?Social Connections: Not on file  ?Intimate Partner Violence: Not on file  ? ? ?Outpatient Medications Prior to Visit  ?Medication Sig Dispense Refill  ? albuterol (VENTOLIN HFA) 108 (90 Base) MCG/ACT inhaler INHALE 2 PUFFS BY MOUTH EVERY 6 HOURS AS NEEDED FOR WHEEZING FOR  SHORTNESS OF BREATH 9 g 0  ? amLODipine (NORVASC) 10 MG tablet Take 1 tablet (10 mg total) by mouth daily. 90 tablet 1  ? Artificial Tear Ointment (DRY EYES OP) Apply to eye as needed.    ? buPROPion (WELLBUTRIN XL) 300 MG 24 hr tablet TAKE 1 TABLET BY MOUTH EVERY DAY 90 tablet 1  ? Ca Phosphate-Cholecalciferol (CALTRATE GUMMY BITES PO) Take by mouth daily.     ? Cholecalciferol (VITAMIN D3) 50 MCG (2000 UT) CHEW Chew 8,000 Units by mouth daily.    ? COVID-19 mRNA Vac-TriS, Pfizer, SUSP injection Inject into the muscle. 0.3 mL 0  ? diclofenac Sodium (VOLTAREN) 1 % GEL Apply 2 g topically daily as needed. 100 g 2  ? famotidine (PEPCID) 20 MG tablet Take 1 tablet (20 mg total) by mouth at bedtime.    ? furosemide (LASIX) 20 MG tablet Take 1 tablet (20 mg total) by mouth daily as needed for edema. 30 tablet 3  ? gabapentin (NEURONTIN) 300 MG capsule Take 1 capsule (300 mg total) by mouth 3 (three) times daily. 90 capsule 3  ? lisinopril (ZESTRIL) 20 MG tablet Take 1 tablet (20 mg total) by mouth daily. 90 tablet 1  ? Menthol, Topical Analgesic, 4 % GEL Apply 1 application topically daily.    ? multivitamin-iron-minerals-folic acid (CENTRUM) chewable tablet Chew 1 tablet by mouth daily.    ? nystatin (MYCOSTATIN/NYSTOP) powder Apply 1 application topically 2 (two) times daily. 60 g 2  ? omeprazole (PRILOSEC) 40 MG capsule Take 1 capsule (40 mg  total) by mouth daily. 90 capsule 1  ? tirzepatide (MOUNJARO) 7.5 MG/0.5ML Pen Inject 7.5 mg into the skin once a week. 2 mL 0  ? venlafaxine XR (EFFEXOR-XR) 150 MG 24 hr capsule Take 1 capsule (150 mg total)

## 2021-06-26 NOTE — Assessment & Plan Note (Signed)
Uncontrolled. She was unable to return to [facility] due to outstanding balance. Will plan referral to Dr. Rennis Harding instead.  ?

## 2021-06-27 NOTE — Telephone Encounter (Signed)
Letter mailed to the patient offering to reschedule. ?

## 2021-06-29 ENCOUNTER — Telehealth: Payer: Self-pay | Admitting: Family

## 2021-06-29 LAB — URINE CULTURE
MICRO NUMBER:: 13315018
SPECIMEN QUALITY:: ADEQUATE

## 2021-06-29 MED ORDER — NITROFURANTOIN MONOHYD MACRO 100 MG PO CAPS: 100.0000 mg | ORAL_CAPSULE | Freq: Two times a day (BID) | ORAL | 0 refills | Status: DC

## 2021-06-29 NOTE — Telephone Encounter (Signed)
See mychart.  

## 2021-07-01 ENCOUNTER — Ambulatory Visit (HOSPITAL_BASED_OUTPATIENT_CLINIC_OR_DEPARTMENT_OTHER): Admission: RE | Admit: 2021-07-01 | Payer: Medicare Other | Source: Ambulatory Visit

## 2021-07-05 ENCOUNTER — Ambulatory Visit: Payer: Medicare Other | Admitting: Pulmonary Disease

## 2021-07-05 NOTE — Telephone Encounter (Signed)
Patient calling to reschedule procedure. ?

## 2021-07-06 ENCOUNTER — Other Ambulatory Visit: Payer: Self-pay | Admitting: Family

## 2021-07-09 ENCOUNTER — Telehealth: Payer: Self-pay

## 2021-07-09 NOTE — Telephone Encounter (Signed)
Called the patient to discuss rescheduling her hospital EGD that she had cancelled. It was previously scheduled for 06/25/21. ?There is no answer and no voicemail for the patient. ?

## 2021-07-10 ENCOUNTER — Telehealth (INDEPENDENT_AMBULATORY_CARE_PROVIDER_SITE_OTHER): Payer: Medicare Other | Admitting: Family

## 2021-07-10 DIAGNOSIS — G8929 Other chronic pain: Secondary | ICD-10-CM

## 2021-07-10 DIAGNOSIS — M5441 Lumbago with sciatica, right side: Secondary | ICD-10-CM | POA: Diagnosis not present

## 2021-07-10 DIAGNOSIS — M5442 Lumbago with sciatica, left side: Secondary | ICD-10-CM

## 2021-07-10 DIAGNOSIS — I10 Essential (primary) hypertension: Secondary | ICD-10-CM | POA: Diagnosis not present

## 2021-07-10 DIAGNOSIS — N281 Cyst of kidney, acquired: Secondary | ICD-10-CM

## 2021-07-10 NOTE — Assessment & Plan Note (Signed)
Had to cancel [place] appointment. She plans to reschedule.  ?

## 2021-07-10 NOTE — Assessment & Plan Note (Signed)
BP is fair.  Not clear how well her bp cuff is working. Recommended that she continue current dose of metoprolol, amlodipine, lisinopril. She will follow up in the office in 3 months for bp recheck.  ?

## 2021-07-10 NOTE — Progress Notes (Signed)
? ? ?MyChart Video Visit ? ? ? ?Virtual Visit via Video Note  ? ?This visit type was conducted due to national recommendations for restrictions regarding the COVID-19 Pandemic (e.g. social distancing) in an effort to limit this patient's exposure and mitigate transmission in our community. This patient is at least at moderate risk for complications without adequate follow up. This format is felt to be most appropriate for this patient at this time. Physical exam was limited by quality of the video and audio technology used for the visit. CMA was able to get the patient set up on a video visit. ? ?Patient location: Home Patient and provider in visit ?Provider location: Office ? ?I discussed the limitations of evaluation and management by telemedicine and the availability of in person appointments. The patient expressed understanding and agreed to proceed. ? ?Visit Date: 07/10/2021 ? ?Today's healthcare provider: Nance Pear, NP  ? ? ? ?Subjective:  ? ? Patient ID: Hailey Vance, female    DOB: [birth year], 56 y.o.   MRN: [id number] ? ?Chief Complaint  ?Patient presents with  ? Hypertension  ?  Follow up after medication change  ? ? ?Hypertension ? ?Patient is in today for a follow up video visit.  ? ?Blood pressure- She measured her blood pressure at home and reports it measured 132/93 and her pulse was 80. During her last visit her blood pressure was elevated and so her Metoprolol succinate was increased from 25 to 50 mg. She reports no new issues while taking it.  ?BP Readings from Last 3 Encounters:  ?07/10/21 (!) 132/93  ?06/26/21 (!) 151/83  ?06/11/21 (!) 150/79  ? ?Pulse Readings from Last 3 Encounters:  ?07/10/21 80  ?06/26/21 87  ?06/11/21 84  ? ?Back pain- She has an upcomming appointment with another provider to manage her back pain.  ? ?[place] of kidney- She reports canceling her ultra sound of her kidney appointment due to her legs hurting.  ? ? ?Past Medical History:  ?Diagnosis Date  ? Bilateral  primary osteoarthritis of knee 03/29/2019  ? Chronic back pain   ? Cystocele with rectocele   ? Generalized anxiety disorder   ? GERD (gastroesophageal reflux disease)   ? Headache   ? otc med prn  ? Heart murmur   ? echo in epic 11-26-2017  normal w/ ef 55-60%  ? History of chest pain   ? pt referred for atyical chest pain , cardiology evaluation w/ dr berry note in epic 10-25-2014, stated with low risk w/ no ischemia , nuclear ef 70% , done 09-19-2014 in epic,  non-cardiac chest pain  ? HTN (hypertension)   ? followed by pcp    (normal nuclear stress test 09-19-2014 in epic)  ? Low blood potassium   ? Major depressive disorder   ? OSA (obstructive sleep apnea)   ? 04/2015 study in e- Severe OSA (AHI = 55.0/hour);   (10-06-2019  per pt has not used cpap since 2019 after gastric bypass)  ? Osteoarthritis of both hips 06/15/2019  ? Plantar fasciitis of left foot   ? S/P gastric bypass 02/15/2018  ? sleeve  ? SUI (stress urinary incontinence, female)   ? Vitamin D deficiency 09/06/2014  ? Wears glasses   ? ? ?Past Surgical History:  ?Procedure Laterality Date  ? BLADDER SUSPENSION N/A 10/12/2019  ? Procedure: TRANSVAGINAL TAPE (TVT) PROCEDURE;  Surgeon: Bobbye Charleston, MD;  Location: [facility];  Service: Gynecology;  Laterality: N/A;  ? CESAREAN SECTION  W/BTL  1990  ? COLONOSCOPY WITH PROPOFOL N/A 08/30/2015  ? Procedure: COLONOSCOPY WITH PROPOFOL;  Surgeon: Mauri Pole, MD;  Location: [facility];  Service: Endoscopy;  Laterality: N/A;  ? CYSTOCELE REPAIR N/A 10/12/2019  ? Procedure: ANTERIOR (CYSTOCELE);  Surgeon: Bobbye Charleston, MD;  Location: [facility];  Service: Gynecology;  Laterality: N/A;  ? CYSTOSCOPY N/A 05/23/2015  ? Procedure: CYSTOSCOPY;  Surgeon: Bobbye Charleston, MD;  Location: [facility];  Service: Gynecology;  Laterality: N/A;  ? CYSTOSCOPY N/A 10/12/2019  ? Procedure: CYSTOSCOPY;  Surgeon: Bobbye Charleston, MD;  Location: [facility];  Service:  Gynecology;  Laterality: N/A;  ? HIATAL HERNIA REPAIR N/A 02/15/2018  ? Procedure: HERNIA REPAIR HIATAL;  Surgeon: Excell Seltzer, MD;  Location: [facility];  Service: General;  Laterality: N/A;  ? HYSTEROSCOPY WITH D & C N/A 01/11/2015  ? Procedure: DILATATION AND CURETTAGE /HYSTEROSCOPY;  Surgeon: Jerelyn Charles, MD;  Location: [facility];  Service: Gynecology;  Laterality: N/A;  ? KNEE ARTHROSCOPY Right 2010  ? LAPAROSCOPIC GASTRIC SLEEVE RESECTION N/A 02/15/2018  ? Procedure: LAPAROSCOPIC GASTRIC SLEEVE RESECTION WITH UPPER ENDO AND ERAS PATHWAY;  Surgeon: Excell Seltzer, MD;  Location: [facility];  Service: General;  Laterality: N/A;  ? LAPAROSCOPIC OOPHORECTOMY Bilateral 2004  ? LEFT OOPHORECTOMY AND PARTIAL RIGHT OOPHORECTOMY  ? LIPOMA EXCISION Left 12/02/2016  ? Procedure: EXCISION LIPOMA OF LEFT SHOULDER;  Surgeon: Garald Balding, MD;  Location: [facility];  Service: Orthopedics;  Laterality: Left;  ? ROBOTIC ASSISTED TOTAL HYSTERECTOMY WITH SALPINGECTOMY Bilateral 05/23/2015  ? Procedure: ROBOTIC ASSISTED TOTAL HYSTERECTOMY WITH SALPINGECTOMYwith right oophorectomy;  Surgeon: Bobbye Charleston, MD;  Location: [facility];  Service: Gynecology;  Laterality: Bilateral;  ? UMBILICAL HERNIA REPAIR  2002  ? ? ?Family History  ?Problem Relation Age of Onset  ? Lung cancer Mother 25  ?     dec  ? Cancer Mother   ? Hypertension Mother   ? Arthritis Mother   ? Cancer Father 40  ?     prostate  ? Hypertension Father   ? Leukemia Sister   ? Hypertension Sister   ? Arthritis Sister   ? Hyperlipidemia Sister   ? Hypertension Brother   ? Arthritis Brother   ? Multiple sclerosis Sister   ? Hypertension Sister   ? Hypertension Sister   ? Hypertension Brother   ? Hypertension Brother   ? Breast cancer Paternal Aunt   ? Alzheimer's disease Paternal Aunt   ? Diabetes Maternal Grandmother   ? Stroke Maternal Grandmother   ? Heart attack Paternal Grandmother   ? Asthma Other   ? ? ?Social History  ? ?Socioeconomic History  ? Marital status:  Divorced  ?  Spouse name: Not on file  ? Number of children: Not on file  ? Years of education: 44  ? Highest education level: High school graduate  ?Occupational History  ? Occupation: Disability  ?Tobacco Use  ? Smoking status: Never  ? Smokeless tobacco: Never  ?Vaping Use  ? Vaping Use: Never used  ?Substance and Sexual Activity  ? Alcohol use: No  ?  Alcohol/week: 0.0 standard drinks  ? Drug use: Never  ? Sexual activity: Not on file  ?  Comment: BTL w/ C/S in 1990  ?Other Topics Concern  ? Not on file  ?Social History Narrative  ? Divorced  ? Lives with son and grand-daughter  ? [place] (lives in [place])  ? [place] lives with patient  ?  86- Daughter [name] (lives in [place])  ? Works at [employer] center  ? Completed some college  ? 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 ?Social Determinants of Health  ? ?Financial Resource Strain: Not on file  ?Food Insecurity: Not on file  ?Transportation Needs: Not on file  ?Physical Activity: Not on file  ?Stress: Not on file  ?Social Connections: Not on file  ?Intimate Partner Violence: Not on file  ? ? ?Outpatient Medications Prior to Visit  ?Medication Sig Dispense Refill  ? albuterol (VENTOLIN HFA) 108 (90 Base) MCG/ACT inhaler INHALE 2 PUFFS BY MOUTH EVERY 6 HOURS AS NEEDED FOR WHEEZING FOR SHORTNESS OF BREATH 9 g 0  ? amLODipine (NORVASC) 10 MG tablet Take 1 tablet (10 mg total) by mouth daily. 90 tablet 1  ? Artificial Tear Ointment (DRY EYES OP) Apply to eye as needed.    ? buPROPion (WELLBUTRIN XL) 300 MG 24 hr tablet TAKE 1 TABLET BY MOUTH EVERY DAY 90 tablet 1  ? Ca Phosphate-Cholecalciferol (CALTRATE GUMMY BITES PO) Take by mouth daily.     ? Cholecalciferol (VITAMIN D3) 50 MCG (2000 UT) CHEW Chew 8,000 Units by mouth daily.    ? COVID-19 mRNA Vac-TriS, Pfizer, SUSP injection Inject into the muscle. 0.3 mL 0  ? diclofenac Sodium (VOLTAREN) 1 % GEL Apply 2 g topically daily as needed. 100 g 2  ? famotidine (PEPCID) 20 MG tablet Take 1 tablet  (20 mg total) by mouth at bedtime.    ? furosemide (LASIX) 20 MG tablet Take 1 tablet (20 mg total) by mouth daily as needed for edema. 30 tablet 3  ? gabapentin (NEURONTIN) 300 MG capsule Take 1 capsule

## 2021-07-10 NOTE — Assessment & Plan Note (Signed)
Scheduled this week for ortho consultation.  ?

## 2021-07-11 DIAGNOSIS — M47816 Spondylosis without myelopathy or radiculopathy, lumbar region: Secondary | ICD-10-CM | POA: Diagnosis not present

## 2021-07-11 DIAGNOSIS — M1712 Unilateral primary osteoarthritis, left knee: Secondary | ICD-10-CM | POA: Diagnosis not present

## 2021-07-11 DIAGNOSIS — M545 Low back pain, unspecified: Secondary | ICD-10-CM | POA: Diagnosis not present

## 2021-07-11 DIAGNOSIS — S8412XA Injury of peroneal nerve at lower leg level, left leg, initial encounter: Secondary | ICD-10-CM | POA: Diagnosis not present

## 2021-07-15 ENCOUNTER — Telehealth: Payer: Self-pay | Admitting: Physical Medicine and Rehabilitation

## 2021-07-15 ENCOUNTER — Encounter (INDEPENDENT_AMBULATORY_CARE_PROVIDER_SITE_OTHER): Payer: Medicare Other | Admitting: Family

## 2021-07-15 DIAGNOSIS — L819 Disorder of pigmentation, unspecified: Secondary | ICD-10-CM | POA: Diagnosis not present

## 2021-07-15 NOTE — Telephone Encounter (Signed)
Pt requesting a call back for hip injection. Please call pt at [phone]. ?

## 2021-07-16 ENCOUNTER — Encounter: Payer: Self-pay | Admitting: Family

## 2021-07-16 MED ORDER — SULFAMETHOXAZOLE-TRIMETHOPRIM 400-80 MG PO TABS
1.0000 | ORAL_TABLET | Freq: Two times a day (BID) | ORAL | 0 refills | Status: DC
Start: 2021-07-16 — End: 2021-08-05

## 2021-07-16 NOTE — Telephone Encounter (Signed)
IC scheduled for repeat hip injection ?

## 2021-07-16 NOTE — Telephone Encounter (Signed)

## 2021-07-17 NOTE — Telephone Encounter (Signed)
Spoke with the patient. She is advised I will contact her when there is an opening at the hospital to do the EGD. ?

## 2021-07-18 ENCOUNTER — Other Ambulatory Visit: Payer: Self-pay

## 2021-07-18 DIAGNOSIS — R1310 Dysphagia, unspecified: Secondary | ICD-10-CM

## 2021-07-18 DIAGNOSIS — Z6841 Body Mass Index (BMI) 40.0 and over, adult: Secondary | ICD-10-CM

## 2021-07-18 NOTE — Telephone Encounter (Signed)
Spoke with the patient. She is now scheduled for her EGD on 09/16/21 in [place] unit. She is to arrive at 8:45 am or when the hospital tells her. She will begin fasting at 6:15 am. Patient acknowledges this. New instructions mailed to her with prep information.

## 2021-07-24 ENCOUNTER — Ambulatory Visit (INDEPENDENT_AMBULATORY_CARE_PROVIDER_SITE_OTHER): Payer: Medicare Other | Admitting: Orthopaedic Surgery

## 2021-07-24 ENCOUNTER — Encounter: Payer: Self-pay | Admitting: Orthopaedic Surgery

## 2021-07-24 VITALS — Ht 64.0 in | Wt 324.0 lb

## 2021-07-24 DIAGNOSIS — M1711 Unilateral primary osteoarthritis, right knee: Secondary | ICD-10-CM

## 2021-07-24 DIAGNOSIS — M1712 Unilateral primary osteoarthritis, left knee: Secondary | ICD-10-CM | POA: Diagnosis not present

## 2021-07-24 DIAGNOSIS — M170 Bilateral primary osteoarthritis of knee: Secondary | ICD-10-CM | POA: Diagnosis not present

## 2021-07-24 MED ORDER — LIDOCAINE HCL 1 % IJ SOLN
2.0000 mL | INTRAMUSCULAR | Status: AC | PRN
  Administered 2021-07-24: 2 mL

## 2021-07-24 MED ORDER — BUPIVACAINE HCL 0.25 % IJ SOLN
2.0000 mL | INTRAMUSCULAR | Status: AC | PRN
  Administered 2021-07-24: 2 mL via INTRA_ARTICULAR

## 2021-07-24 MED ORDER — METHYLPREDNISOLONE ACETATE 40 MG/ML IJ SUSP
80.0000 mg | INTRAMUSCULAR | Status: AC | PRN
  Administered 2021-07-24: 80 mg via INTRA_ARTICULAR

## 2021-07-24 NOTE — Progress Notes (Signed)
Office Visit Note   Patient: Hailey Vance           Date of Birth: [birth year]           MRN: [id number] Visit Date: 07/24/2021              Requested by: Debbrah Alar, NP [address] [place],  [place] [id number] PCP: Debbrah Alar, NP   Assessment & Plan: Visit Diagnoses: Bilateral knee pain  Plan: Pleasant 56 year old woman with history of bilateral knee arthritis.  Her last knee injections were in [place] and this seemed to help her.  She also gets a hip injection with Dr. Ernestina Patches periodically.  Comes in today requesting injections into her knees.  We will go forward with bilateral cortisone injections.  She would like to at some point follow-up on her shoulder.  Explained that we could not give her an injection in the shoulder today because of both knees being injected she understood.  She also has appointment scheduled to her hip injected with Dr. Ernestina Patches  Follow-Up Instructions: No follow-ups on file.   Orders:  No orders of the defined types were placed in this encounter.  No orders of the defined types were placed in this encounter.     Procedures: Large Joint Inj: bilateral knee on 07/24/2021 10:35 AM Indications: pain and diagnostic evaluation Details: 25 G 1.5 in needle, anteromedial approach  Arthrogram: No  Medications (Right): 2 mL lidocaine 1 %; 2 mL bupivacaine 0.25 %; 80 mg methylPREDNISolone acetate 40 MG/ML Medications (Left): 2 mL lidocaine 1 %; 2 mL bupivacaine 0.25 %; 80 mg methylPREDNISolone acetate 40 MG/ML Outcome: tolerated well, no immediate complications Procedure, treatment alternatives, risks and benefits explained, specific risks discussed. Consent was given by the patient.      Clinical Data: No additional findings.   Subjective: Chief Complaint  Patient presents with   Right Knee - Follow-up, Pain   Left Knee - Follow-up, Pain  Patient presents today for bilateral knee pain. She received bilateral  cortisone injections on 03/05/2021. She is wanting to have both knees injected again today. She is not diabetic.     Review of Systems  All other systems reviewed and are negative.   Objective: Vital Signs: LMP 10/02/2007   Physical Exam Constitutional:      Appearance: Normal appearance.  Pulmonary:     Effort: Pulmonary effort is normal.  Skin:    General: Skin is warm and dry.  Neurological:     Mental Status: She is alert.  Psychiatric:        Mood and Affect: Mood normal.    Ortho Exam Examination bilateral knees she has no effusion no redness no erythema.  She does have some pain with range of motion's bilaterally Specialty Comments:  No specialty comments available.  Imaging: No results found.   PMFS History: Patient Active Problem List   Diagnosis Date Noted   Renal cyst 06/26/2021   Left shoulder pain 09/12/2020   Primary osteoarthritis, left shoulder 09/11/2020   Dysphagia 08/23/2020   B12 deficiency 08/22/2020   Hyperlipidemia 08/22/2020   Moderate episode of recurrent major depressive disorder ([place]) 08/22/2020   Lumbar radiculopathy 08/22/2020   Major depressive disorder    Osteoarthritis of both hips 06/15/2019   Morbid obesity ([place]) 02/15/2018   Lipoma of left shoulder 12/02/2016   BMI 50.0-59.9, adult [facility])    Preventative health care 10/10/2014   Essential hypertension 09/06/2014   Osteoarthritis 09/06/2014  Post-menopausal bleeding 09/06/2014   OSA (obstructive sleep apnea) 09/06/2014   Generalized anxiety disorder 09/06/2014   Vitamin D deficiency 09/06/2014   Chronic back pain    Past Medical History:  Diagnosis Date   Bilateral primary osteoarthritis of knee 03/29/2019   Chronic back pain    Cystocele with rectocele    Generalized anxiety disorder    GERD (gastroesophageal reflux disease)    Headache    otc med prn   Heart murmur    echo in epic 11-26-2017  normal w/ ef 55-60%   History of chest pain    pt referred for atyical  chest pain , cardiology evaluation w/ dr berry note in epic 10-25-2014, stated with low risk w/ no ischemia , nuclear ef 70% , done 09-19-2014 in epic,  non-cardiac chest pain   HTN (hypertension)    followed by pcp    (normal nuclear stress test 09-19-2014 in epic)   Low blood potassium    Major depressive disorder    OSA (obstructive sleep apnea)    04/2015 study in e- Severe OSA (AHI = 55.0/hour);   (10-06-2019  per pt has not used cpap since 2019 after gastric bypass)   Osteoarthritis of both hips 06/15/2019   Plantar fasciitis of left foot    S/P gastric bypass 02/15/2018   sleeve   SUI (stress urinary incontinence, female)    Vitamin D deficiency 09/06/2014   Wears glasses     Family History  Problem Relation Age of Onset   Lung cancer Mother 23       dec   Cancer Mother    Hypertension Mother    Arthritis Mother    Cancer Father 28       prostate   Hypertension Father    Leukemia Sister    Hypertension Sister    Arthritis Sister    Hyperlipidemia Sister    Hypertension Brother    Arthritis Brother    Multiple sclerosis Sister    Hypertension Sister    Hypertension Sister    Hypertension Brother    Hypertension Brother    Breast cancer Paternal Aunt    Alzheimer's disease Paternal Aunt    Diabetes Maternal Grandmother    Stroke Maternal Grandmother    Heart attack Paternal Grandmother    Asthma Other     Past Surgical History:  Procedure Laterality Date   BLADDER SUSPENSION N/A 10/12/2019   Procedure: TRANSVAGINAL TAPE (TVT) PROCEDURE;  Surgeon: Bobbye Charleston, MD;  Location: [facility];  Service: Gynecology;  Laterality: N/A;   CESAREAN SECTION W/BTL  1990   COLONOSCOPY WITH PROPOFOL N/A 08/30/2015   Procedure: COLONOSCOPY WITH PROPOFOL;  Surgeon: Mauri Pole, MD;  Location: [facility];  Service: Endoscopy;  Laterality: N/A;   CYSTOCELE REPAIR N/A 10/12/2019   Procedure: ANTERIOR (CYSTOCELE);  Surgeon: Bobbye Charleston, MD;  Location:  [facility];  Service: Gynecology;  Laterality: N/A;   CYSTOSCOPY N/A 05/23/2015   Procedure: CYSTOSCOPY;  Surgeon: Bobbye Charleston, MD;  Location: [facility];  Service: Gynecology;  Laterality: N/A;   CYSTOSCOPY N/A 10/12/2019   Procedure: CYSTOSCOPY;  Surgeon: Bobbye Charleston, MD;  Location: [facility];  Service: Gynecology;  Laterality: N/A;   HIATAL HERNIA REPAIR N/A 02/15/2018   Procedure: HERNIA REPAIR HIATAL;  Surgeon: Excell Seltzer, MD;  Location: [facility];  Service: General;  Laterality: N/A;   HYSTEROSCOPY WITH D & C N/A 01/11/2015   Procedure: DILATATION AND CURETTAGE /HYSTEROSCOPY;  Surgeon: Beryle Quant  Carlis Abbott, MD;  Location: [facility];  Service: Gynecology;  Laterality: N/A;   KNEE ARTHROSCOPY Right 2010   LAPAROSCOPIC GASTRIC SLEEVE RESECTION N/A 02/15/2018   Procedure: LAPAROSCOPIC GASTRIC SLEEVE RESECTION WITH UPPER ENDO AND ERAS PATHWAY;  Surgeon: Excell Seltzer, MD;  Location: [facility];  Service: General;  Laterality: N/A;   LAPAROSCOPIC OOPHORECTOMY Bilateral 2004   LEFT OOPHORECTOMY AND PARTIAL RIGHT OOPHORECTOMY   LIPOMA EXCISION Left 12/02/2016   Procedure: EXCISION LIPOMA OF LEFT SHOULDER;  Surgeon: Garald Balding, MD;  Location: [facility];  Service: Orthopedics;  Laterality: Left;   ROBOTIC ASSISTED TOTAL HYSTERECTOMY WITH SALPINGECTOMY Bilateral 05/23/2015   Procedure: ROBOTIC ASSISTED TOTAL HYSTERECTOMY WITH SALPINGECTOMYwith right oophorectomy;  Surgeon: Bobbye Charleston, MD;  Location: [facility];  Service: Gynecology;  Laterality: Bilateral;   UMBILICAL HERNIA REPAIR  2002   Social History   Occupational History   Occupation: Disability  Tobacco Use   Smoking status: Never   Smokeless tobacco: Never  Vaping Use   Vaping Use: Never used  Substance and Sexual Activity   Alcohol use: No    Alcohol/week: 0.0 standard drinks   Drug use: Never   Sexual activity: Not on file    Comment: BTL w/ C/S in 1990

## 2021-07-27 ENCOUNTER — Other Ambulatory Visit: Payer: Self-pay | Admitting: Family

## 2021-07-31 DIAGNOSIS — G4733 Obstructive sleep apnea (adult) (pediatric): Secondary | ICD-10-CM | POA: Diagnosis not present

## 2021-08-05 ENCOUNTER — Ambulatory Visit (INDEPENDENT_AMBULATORY_CARE_PROVIDER_SITE_OTHER): Payer: Medicare Other

## 2021-08-05 VITALS — Ht 64.0 in | Wt 324.0 lb

## 2021-08-05 DIAGNOSIS — Z0000 Encounter for general adult medical examination without abnormal findings: Secondary | ICD-10-CM | POA: Diagnosis not present

## 2021-08-05 NOTE — Patient Instructions (Signed)
Hailey Vance , Thank you for taking time to complete your Medicare Wellness Visit. I appreciate your ongoing commitment to your health goals. Please review the following plan we discussed and let me know if I can assist you in the future.   Screening recommendations/referrals: Colonoscopy: Completed 08/30/2015-Due 08/29/2025 Mammogram: Scheduled for 08/06/2021 Bone Density: Discuss when to schedule next appt with PCP. Recommended yearly ophthalmology/optometry visit for glaucoma screening and checkup Recommended yearly dental visit for hygiene and checkup  Vaccinations: Influenza vaccine: Due 11/2021 Pneumococcal vaccine: Due at age 8 Tdap vaccine: Up to date Shingles vaccine: Due-May obtain vaccine at your local pharmacy. Covid-19: May obtain booster at your local pharmacy.  Advanced directives: May pick up information at your next office visit  Conditions/risks identified: See problem list  Next appointment: Follow up in one year for your annual wellness visit.   Preventive Care 40-64 Years, Female Preventive care refers to lifestyle choices and visits with your health care provider that can promote health and wellness. What does preventive care include? A yearly physical exam. This is also called an annual well check. Dental exams once or twice a year. Routine eye exams. Ask your health care provider how often you should have your eyes checked. Personal lifestyle choices, including: Daily care of your teeth and gums. Regular physical activity. Eating a healthy diet. Avoiding tobacco and drug use. Limiting alcohol use. Practicing safe sex. Taking low-dose aspirin daily starting at age 22. Taking vitamin and mineral supplements as recommended by your health care provider. What happens during an annual well check? The services and screenings done by your health care provider during your annual well check will depend on your age, overall health, lifestyle risk factors, and family  history of disease. Counseling  Your health care provider may ask you questions about your: Alcohol use. Tobacco use. Drug use. Emotional well-being. Home and relationship well-being. Sexual activity. Eating habits. Work and work Statistician. Method of birth control. Menstrual cycle. Pregnancy history. Screening  You may have the following tests or measurements: Height, weight, and BMI. Blood pressure. Lipid and cholesterol levels. These may be checked every 5 years, or more frequently if you are over 61 years old. Skin check. Lung cancer screening. You may have this screening every year starting at age 49 if you have a 30-pack-year history of smoking and currently smoke or have quit within the past 15 years. Fecal occult blood test (FOBT) of the stool. You may have this test every year starting at age 49. Flexible sigmoidoscopy or colonoscopy. You may have a sigmoidoscopy every 5 years or a colonoscopy every 10 years starting at age 62. Hepatitis C blood test. Hepatitis B blood test. Sexually transmitted disease (STD) testing. Diabetes screening. This is done by checking your blood sugar (glucose) after you have not eaten for a while (fasting). You may have this done every 1-3 years. Mammogram. This may be done every 1-2 years. Talk to your health care provider about when you should start having regular mammograms. This may depend on whether you have a family history of breast cancer. BRCA-related cancer screening. This may be done if you have a family history of breast, ovarian, tubal, or peritoneal cancers. Pelvic exam and Pap test. This may be done every 3 years starting at age 41. Starting at age 45, this may be done every 5 years if you have a Pap test in combination with an HPV test. Bone density scan. This is done to screen for osteoporosis. You may have  this scan if you are at high risk for osteoporosis. Discuss your test results, treatment options, and if necessary, the need  for more tests with your health care provider. Vaccines  Your health care provider may recommend certain vaccines, such as: Influenza vaccine. This is recommended every year. Tetanus, diphtheria, and acellular pertussis (Tdap, Td) vaccine. You may need a Td booster every 10 years. Zoster vaccine. You may need this after age 63. Pneumococcal 13-valent conjugate (PCV13) vaccine. You may need this if you have certain conditions and were not previously vaccinated. Pneumococcal polysaccharide (PPSV23) vaccine. You may need one or two doses if you smoke cigarettes or if you have certain conditions. Talk to your health care provider about which screenings and vaccines you need and how often you need them. This information is not intended to replace advice given to you by your health care provider. Make sure you discuss any questions you have with your health care provider. Document Released: 03/16/2015 Document Revised: 11/07/2015 Document Reviewed: 12/19/2014 Elsevier Interactive Patient Education  2017 [employer] Prevention in the Home Falls can cause injuries. They can happen to people of all ages. There are many things you can do to make your home safe and to help prevent falls. What can I do on the outside of my home? Regularly fix the edges of walkways and driveways and fix any cracks. Remove anything that might make you trip as you walk through a door, such as a raised step or threshold. Trim any bushes or trees on the path to your home. Use bright outdoor lighting. Clear any walking paths of anything that might make someone trip, such as rocks or tools. Regularly check to see if handrails are loose or broken. Make sure that both sides of any steps have handrails. Any raised decks and porches should have guardrails on the edges. Have any leaves, snow, or ice cleared regularly. Use sand or salt on walking paths during winter. Clean up any spills in your garage right away. This  includes oil or grease spills. What can I do in the bathroom? Use night lights. Install grab bars by the toilet and in the tub and shower. Do not use towel bars as grab bars. Use non-skid mats or decals in the tub or shower. If you need to sit down in the shower, use a plastic, non-slip stool. Keep the floor dry. Clean up any water that spills on the floor as soon as it happens. Remove soap buildup in the tub or shower regularly. Attach bath mats securely with double-sided non-slip rug tape. Do not have throw rugs and other things on the floor that can make you trip. What can I do in the bedroom? Use night lights. Make sure that you have a light by your bed that is easy to reach. Do not use any sheets or blankets that are too big for your bed. They should not hang down onto the floor. Have a firm chair that has side arms. You can use this for support while you get dressed. Do not have throw rugs and other things on the floor that can make you trip. What can I do in the kitchen? Clean up any spills right away. Avoid walking on wet floors. Keep items that you use a lot in easy-to-reach places. If you need to reach something above you, use a strong step stool that has a grab bar. Keep electrical cords out of the way. Do not use floor polish or  wax that makes floors slippery. If you must use wax, use non-skid floor wax. Do not have throw rugs and other things on the floor that can make you trip. What can I do with my stairs? Do not leave any items on the stairs. Make sure that there are handrails on both sides of the stairs and use them. Fix handrails that are broken or loose. Make sure that handrails are as long as the stairways. Check any carpeting to make sure that it is firmly attached to the stairs. Fix any carpet that is loose or worn. Avoid having throw rugs at the top or bottom of the stairs. If you do have throw rugs, attach them to the floor with carpet tape. Make sure that you  have a light switch at the top of the stairs and the bottom of the stairs. If you do not have them, ask someone to add them for you. What else can I do to help prevent falls? Wear shoes that: Do not have high heels. Have rubber bottoms. Are comfortable and fit you well. Are closed at the toe. Do not wear sandals. If you use a stepladder: Make sure that it is fully opened. Do not climb a closed stepladder. Make sure that both sides of the stepladder are locked into place. Ask someone to hold it for you, if possible. Clearly mark and make sure that you can see: Any grab bars or handrails. First and last steps. Where the edge of each step is. Use tools that help you move around (mobility aids) if they are needed. These include: Canes. Walkers. Scooters. Crutches. Turn on the lights when you go into a dark area. Replace any light bulbs as soon as they burn out. Set up your furniture so you have a clear path. Avoid moving your furniture around. If any of your floors are uneven, fix them. If there are any pets around you, be aware of where they are. Review your medicines with your doctor. Some medicines can make you feel dizzy. This can increase your chance of falling. Ask your doctor what other things that you can do to help prevent falls. This information is not intended to replace advice given to you by your health care provider. Make sure you discuss any questions you have with your health care provider. Document Released: 12/14/2008 Document Revised: 07/26/2015 Document Reviewed: 03/24/2014 Elsevier Interactive Patient Education  2017 [employer].

## 2021-08-05 NOTE — Progress Notes (Signed)
Subjective:   Hailey Vance is a 56 y.o. female who presents for an Initial Medicare Annual Wellness Visit.  I connected with Shenell today by telephone and verified that I am speaking with the correct person using two identifiers. Location patient: home Location provider: work Persons participating in the virtual visit: patient, Marine scientist.    I discussed the limitations, risks, security and privacy concerns of performing an evaluation and management service by telephone and the availability of in person appointments. I also discussed with the patient that there may be a patient responsible charge related to this service. The patient expressed understanding and verbally consented to this telephonic visit.    Interactive audio and video telecommunications were attempted between this provider and patient, however failed, due to patient having technical difficulties OR patient did not have access to video capability.  We continued and completed visit with audio only.  Some vital signs may be absent or patient reported.   Time Spent with patient on telephone encounter: 20 minutes   Review of Systems     Cardiac Risk Factors include: obesity (BMI >30kg/m2);sedentary lifestyle;hypertension;dyslipidemia     Objective:    Today's Vitals   08/05/21 0831  Weight: (!) 324 lb (147 kg)  Height: '5\' 4"'$  (1.626 m)   Body mass index is 55.61 kg/m.     08/05/2021    8:34 AM 10/12/2019    9:21 AM 02/15/2018   10:00 PM 02/15/2018   11:37 AM 02/11/2018    7:51 AM 09/30/2017    9:45 AM 12/01/2016    1:11 PM  Advanced Directives  Does Patient Have a Medical Advance Directive? No No No No No No No  Would patient like information on creating a medical advance directive?  Yes (Inpatient - patient defers creating a medical advance directive at this time - Information given) No - Patient declined No - Patient declined No - Patient declined Yes (MAU/Ambulatory/Procedural Areas - Information given) No -  Patient declined    Current Medications (verified) Outpatient Encounter Medications as of 08/05/2021  Medication Sig   albuterol (VENTOLIN HFA) 108 (90 Base) MCG/ACT inhaler INHALE 2 PUFFS BY MOUTH EVERY 6 HOURS AS NEEDED FOR WHEEZING FOR SHORTNESS OF BREATH   amLODipine (NORVASC) 10 MG tablet Take 1 tablet (10 mg total) by mouth daily.   Artificial Tear Ointment (DRY EYES OP) Apply to eye as needed.   buPROPion (WELLBUTRIN XL) 300 MG 24 hr tablet TAKE 1 TABLET BY MOUTH EVERY DAY   Ca Phosphate-Cholecalciferol (CALTRATE GUMMY BITES PO) Take by mouth daily.    Cholecalciferol (VITAMIN D3) 50 MCG (2000 UT) CHEW Chew 8,000 Units by mouth daily.   diclofenac Sodium (VOLTAREN) 1 % GEL Apply 2 g topically daily as needed.   famotidine (PEPCID) 20 MG tablet Take 1 tablet (20 mg total) by mouth at bedtime.   furosemide (LASIX) 20 MG tablet Take 1 tablet (20 mg total) by mouth daily as needed for edema.   gabapentin (NEURONTIN) 300 MG capsule TAKE 1 CAPSULE BY MOUTH THREE TIMES DAILY   lisinopril (ZESTRIL) 20 MG tablet Take 1 tablet (20 mg total) by mouth daily.   Menthol, Topical Analgesic, 4 % GEL Apply 1 application topically daily.   metoprolol succinate (TOPROL-XL) 50 MG 24 hr tablet Take 1 tablet (50 mg total) by mouth daily. Take with or immediately following a meal.   MOUNJARO 7.5 MG/0.5ML Pen INJECT 7.5 MG SUBCUTANEOUSLY ONCE EVERY 7 DAYS   multivitamin-iron-minerals-folic acid (CENTRUM) chewable tablet Chew  1 tablet by mouth daily.   nitrofurantoin, macrocrystal-monohydrate, (MACROBID) 100 MG capsule Take 1 capsule (100 mg total) by mouth 2 (two) times daily.   nystatin (MYCOSTATIN/NYSTOP) powder Apply 1 application topically 2 (two) times daily.   omeprazole (PRILOSEC) 40 MG capsule Take 1 capsule (40 mg total) by mouth daily.   venlafaxine XR (EFFEXOR-XR) 150 MG 24 hr capsule Take 1 capsule (150 mg total) by mouth daily with breakfast.   COVID-19 mRNA Vac-TriS, Pfizer, SUSP injection Inject  into the muscle. (Patient not taking: Reported on 08/05/2021)   [DISCONTINUED] sulfamethoxazole-trimethoprim (BACTRIM) 400-80 MG tablet Take 1 tablet by mouth 2 (two) times daily.   No facility-administered encounter medications on file as of 08/05/2021.    Allergies (verified) Penicillins   History: Past Medical History:  Diagnosis Date   Bilateral primary osteoarthritis of knee 03/29/2019   Chronic back pain    Cystocele with rectocele    Generalized anxiety disorder    GERD (gastroesophageal reflux disease)    Headache    otc med prn   Heart murmur    echo in epic 11-26-2017  normal w/ ef 55-60%   History of chest pain    pt referred for atyical chest pain , cardiology evaluation w/ dr berry note in epic 10-25-2014, stated with low risk w/ no ischemia , nuclear ef 70% , done 09-19-2014 in epic,  non-cardiac chest pain   HTN (hypertension)    followed by pcp    (normal nuclear stress test 09-19-2014 in epic)   Low blood potassium    Major depressive disorder    OSA (obstructive sleep apnea)    04/2015 study in e- Severe OSA (AHI = 55.0/hour);   (10-06-2019  per pt has not used cpap since 2019 after gastric bypass)   Osteoarthritis of both hips 06/15/2019   Plantar fasciitis of left foot    S/P gastric bypass 02/15/2018   sleeve   SUI (stress urinary incontinence, female)    Vitamin D deficiency 09/06/2014   Wears glasses    Past Surgical History:  Procedure Laterality Date   BLADDER SUSPENSION N/A 10/12/2019   Procedure: TRANSVAGINAL TAPE (TVT) PROCEDURE;  Surgeon: Bobbye Charleston, MD;  Location: [facility];  Service: Gynecology;  Laterality: N/A;   CESAREAN SECTION W/BTL  1990   COLONOSCOPY WITH PROPOFOL N/A 08/30/2015   Procedure: COLONOSCOPY WITH PROPOFOL;  Surgeon: Mauri Pole, MD;  Location: [facility];  Service: Endoscopy;  Laterality: N/A;   CYSTOCELE REPAIR N/A 10/12/2019   Procedure: ANTERIOR (CYSTOCELE);  Surgeon: Bobbye Charleston, MD;   Location: [facility];  Service: Gynecology;  Laterality: N/A;   CYSTOSCOPY N/A 05/23/2015   Procedure: CYSTOSCOPY;  Surgeon: Bobbye Charleston, MD;  Location: [facility];  Service: Gynecology;  Laterality: N/A;   CYSTOSCOPY N/A 10/12/2019   Procedure: CYSTOSCOPY;  Surgeon: Bobbye Charleston, MD;  Location: [facility];  Service: Gynecology;  Laterality: N/A;   HIATAL HERNIA REPAIR N/A 02/15/2018   Procedure: HERNIA REPAIR HIATAL;  Surgeon: Excell Seltzer, MD;  Location: [facility];  Service: General;  Laterality: N/A;   HYSTEROSCOPY WITH D & C N/A 01/11/2015   Procedure: DILATATION AND CURETTAGE /HYSTEROSCOPY;  Surgeon: Jerelyn Charles, MD;  Location: [facility];  Service: Gynecology;  Laterality: N/A;   KNEE ARTHROSCOPY Right 2010   LAPAROSCOPIC GASTRIC SLEEVE RESECTION N/A 02/15/2018   Procedure: LAPAROSCOPIC GASTRIC SLEEVE RESECTION WITH UPPER ENDO AND ERAS PATHWAY;  Surgeon: Excell Seltzer, MD;  Location: [facility];  Service: General;  Laterality: N/A;   LAPAROSCOPIC OOPHORECTOMY Bilateral 2004   LEFT OOPHORECTOMY AND PARTIAL RIGHT OOPHORECTOMY   LIPOMA EXCISION Left 12/02/2016   Procedure: EXCISION LIPOMA OF LEFT SHOULDER;  Surgeon: Garald Balding, MD;  Location: [facility];  Service: Orthopedics;  Laterality: Left;   ROBOTIC ASSISTED TOTAL HYSTERECTOMY WITH SALPINGECTOMY Bilateral 05/23/2015   Procedure: ROBOTIC ASSISTED TOTAL HYSTERECTOMY WITH SALPINGECTOMYwith right oophorectomy;  Surgeon: Bobbye Charleston, MD;  Location: [facility];  Service: Gynecology;  Laterality: Bilateral;   UMBILICAL HERNIA REPAIR  2002   Family History  Problem Relation Age of Onset   Lung cancer Mother 37       dec   Cancer Mother    Hypertension Mother    Arthritis Mother    Cancer Father 49       prostate   Hypertension Father    Leukemia Sister    Hypertension Sister    Arthritis Sister    Hyperlipidemia Sister    Hypertension Brother    Arthritis Brother    Multiple sclerosis Sister     Hypertension Sister    Hypertension Sister    Hypertension Brother    Hypertension Brother    Breast cancer Paternal Aunt    Alzheimer's disease Paternal Aunt    Diabetes Maternal Grandmother    Stroke Maternal Grandmother    Heart attack Paternal Grandmother    Asthma Other    Social History   Socioeconomic History   Marital status: Divorced    Spouse name: Not on file   Number of children: Not on file   Years of education: 12   Highest education level: High school graduate  Occupational History   Occupation: Disability  Tobacco Use   Smoking status: Never   Smokeless tobacco: Never  Vaping Use   Vaping Use: Never used  Substance and Sexual Activity   Alcohol use: No    Alcohol/week: 0.0 standard drinks   Drug use: Never   Sexual activity: Not on file    Comment: BTL w/ C/S in 1990  Other Topics Concern   Not on file  Social History Narrative   Divorced   Lives with son and grand-daughter   14- [place] (lives in [place])   [place] lives with patient   74- Daughter [name] (lives in [place])   Works at [employer]   Completed some college   Careers adviser   Social Determinants of Radio broadcast assistant Strain: Low Risk    Difficulty of Paying Living Expenses: Not hard at all  Food Insecurity: No Food Insecurity   Worried About Charity fundraiser in the Last Year: Never true   Arboriculturist in the Last Year: Never true  Transportation Needs: No Transportation Needs   Lack of Transportation (Medical): No   Lack of Transportation (Non-Medical): No  Physical Activity: Inactive   Days of Exercise per Week: 0 days   Minutes of Exercise per Session: 0 min  Stress: No Stress Concern Present   Feeling of Stress : Not at all  Social Connections: Socially Isolated   Frequency of Communication with Friends and Family: Once a week   Frequency of Social Gatherings with Friends and Family: More than three times a week    Attends Religious Services: Never   Marine scientist or Organizations: No   Attends Archivist Meetings: Never   Marital Status: Divorced    Tobacco Counseling Counseling given: Not Answered  Clinical Intake:  Pre-visit preparation completed: Yes  Pain : No/denies pain     BMI - recorded: 55.61 Nutritional Status: BMI > 30  Obese Nutritional Risks: None Diabetes: No  How often do you need to have someone help you when you read instructions, pamphlets, or other written materials from your doctor or pharmacy?: 1 - Never  Diabetic?No  Interpreter Needed?: No  Information entered by :: Caroleen Hamman LPN   Activities of Daily Living    08/05/2021    8:38 AM  In your present state of health, do you have any difficulty performing the following activities:  Hearing? 0  Vision? 0  Difficulty concentrating or making decisions? 0  Walking or climbing stairs? 1  Comment stairs  Dressing or bathing? 0  Doing errands, shopping? 0  Preparing Food and eating ? N  Using the Toilet? N  In the past six months, have you accidently leaked urine? Y  Do you have problems with loss of bowel control? N  Managing your Medications? N  Managing your Finances? N  Housekeeping or managing your Housekeeping? N    Patient Care Team: Debbrah Alar, NP as PCP - General (Internal Medicine) Kary Kos, MD as Consulting Physician (Neurosurgery) Yisroel Ramming, Everardo All, MD as Consulting Physician (Obstetrics and Gynecology)  Indicate any recent Medical Services you may have received from other than [facility] providers in the past year (date may be approximate).     Assessment:   This is a routine wellness examination for Hailey Vance.  Hearing/Vision screen Hearing Screening - Comments:: No issues Vision Screening - Comments:: Last eye exam-03/2021  Dietary issues and exercise activities discussed: Current Exercise Habits: The patient does not participate in regular  exercise at present, Exercise limited by: orthopedic condition(s)   Goals Addressed             This Visit's Progress    Patient Stated       Increase activity       Depression Screen    08/05/2021    8:37 AM 11/28/2020   12:17 PM 08/22/2020   12:58 PM 03/20/2020    1:46 PM 05/03/2019    1:14 PM 10/19/2018    2:23 PM 03/02/2018    9:41 AM  PHQ 2/9 Scores  PHQ - 2 Score '1 2 3 '$ 0 4 6 0  PHQ- 9 Score  8 12 0 13 20     Fall Risk    08/05/2021    8:35 AM 08/22/2020   11:33 AM 03/02/2018    9:41 AM 01/26/2018   12:00 PM 12/28/2017   11:36 AM  Fall Risk   Falls in the past year? 1 1 0 0 No  Number falls in past yr: 0 0     Injury with Fall? 0 1     Follow up Falls prevention discussed        FALL RISK PREVENTION PERTAINING TO THE HOME:  Any stairs in or around the home? Yes  If so, are there any without handrails? No  Home free of loose throw rugs in walkways, pet beds, electrical cords, etc? Yes  Adequate lighting in your home to reduce risk of falls? Yes   ASSISTIVE DEVICES UTILIZED TO PREVENT FALLS:  Life alert? No  Use of a cane, walker or w/c? Yes  Grab bars in the bathroom? Yes  Shower chair or bench in shower? Yes  Elevated toilet seat or a handicapped toilet? Yes   TIMED UP AND GO:  Was the test performed? No . Phone visit   Cognitive Function:Normal cognitive status assessed by this Nurse Health Advisor. No abnormalities found.          Immunizations Immunization History  Administered Date(s) Administered   Influenza-Unspecified 12/28/2014   PFIZER Comirnaty(Gray Top)Covid-19 Tri-Sucrose Vaccine 08/22/2020   Tdap 10/10/2014    TDAP status: Up to date  Flu vaccine status: Due 11/2021  Pneumococcal vaccine status: Due at age 60  Covid-19 vaccine status: Information provided on how to obtain vaccines.   Qualifies for Shingles Vaccine? Yes   Zostavax completed No   Shingrix Completed?: No.    Education has been provided regarding the importance  of this vaccine. Patient has been advised to call insurance company to determine out of pocket expense if they have not yet received this vaccine. Advised may also receive vaccine at local pharmacy or Health Dept. Verbalized acceptance and understanding.  Screening Tests Health Maintenance  Topic Date Due   Zoster Vaccines- Shingrix (1 of 2) Never done   COVID-19 Vaccine (2 - Pfizer series) 09/12/2020   PAP SMEAR-Modifier  11/20/2020   INFLUENZA VACCINE  10/01/2021   MAMMOGRAM  05/19/2022   TETANUS/TDAP  10/09/2024   COLONOSCOPY (Pts 45-22yr Insurance coverage will need to be confirmed)  08/29/2025   Hepatitis C Screening  Completed   HIV Screening  Completed   HPV VACCINES  Aged Out    Health Maintenance  Health Maintenance Due  Topic Date Due   Zoster Vaccines- Shingrix (1 of 2) Never done   COVID-19 Vaccine (2 - Pfizer series) 09/12/2020   PAP SMEAR-Modifier  11/20/2020    Colorectal cancer screening: Type of screening: Colonoscopy. Completed 08/30/2015. Repeat every 10 years  Mammogram status: Scheduled for 08/06/2021  Bone Density status: Completed 07/01/2019. Results reflect: Bone density results: NORMAL. Repeat every 2 years.  Lung Cancer Screening: (Low Dose CT Chest recommended if Age 58-80years, 30 pack-year currently smoking OR have quit w/in 15years.) does not qualify.     Additional Screening:  Hepatitis C Screening: Completed 05/02/2015  Vision Screening: Recommended annual ophthalmology exams for early detection of glaucoma and other disorders of the eye. Is the patient up to date with their annual eye exam?  Yes  Who is the provider or what is the name of the office in which the patient attends annual eye exams? Pt unsure of name  Dental Screening: Recommended annual dental exams for proper oral hygiene  Community Resource Referral / Chronic Care Management: CRR required this visit?  No   CCM required this visit?  No      Plan:     I have personally  reviewed and noted the following in the patient's chart:   Medical and social history Use of alcohol, tobacco or illicit drugs  Current medications and supplements including opioid prescriptions. Patient is not currently taking opioid prescriptions. Functional ability and status Nutritional status Physical activity Advanced directives List of other physicians Hospitalizations, surgeries, and ER visits in previous 12 months Vitals Screenings to include cognitive, depression, and falls Referrals and appointments  In addition, I have reviewed and discussed with patient certain preventive protocols, quality metrics, and best practice recommendations. A written personalized care plan for preventive services as well as general preventive health recommendations were provided to patient.   Due to this being a telephonic visit, the after visit summary with patients personalized plan was offered to patient via mail or my-chart. Patient would like to access on my-chart.   MJana Half  Lillie Fragmin, LPN   [id number]  Nurse Health Advisor  Nurse Notes: None

## 2021-08-07 DIAGNOSIS — Z87440 Personal history of urinary (tract) infections: Secondary | ICD-10-CM | POA: Diagnosis not present

## 2021-08-11 ENCOUNTER — Other Ambulatory Visit: Payer: Self-pay | Admitting: Family

## 2021-08-12 ENCOUNTER — Ambulatory Visit: Payer: Medicare Other | Admitting: Physical Medicine and Rehabilitation

## 2021-09-09 ENCOUNTER — Encounter (HOSPITAL_COMMUNITY): Payer: Self-pay | Admitting: Gastroenterology

## 2021-09-09 NOTE — Progress Notes (Signed)
Attempted to obtain medical history via telephone, unable to reach at this time. Unable to leave voicemail to return pre surgical testing department's phone call,due to mailbox full.  

## 2021-09-10 ENCOUNTER — Encounter: Payer: Self-pay | Admitting: Family

## 2021-09-10 MED ORDER — MOUNJARO 7.5 MG/0.5ML ~~LOC~~ SOAJ: SUBCUTANEOUS | 1 refills | Status: DC

## 2021-09-16 ENCOUNTER — Encounter (HOSPITAL_COMMUNITY): Payer: Self-pay | Admitting: Certified Registered"

## 2021-09-16 ENCOUNTER — Encounter (HOSPITAL_COMMUNITY): Admission: RE | Payer: Self-pay | Source: Home / Self Care

## 2021-09-16 ENCOUNTER — Ambulatory Visit (HOSPITAL_COMMUNITY): Admission: RE | Admit: 2021-09-16 | Payer: Medicare Other | Source: Home / Self Care | Admitting: Gastroenterology

## 2021-09-16 SURGERY — ESOPHAGOGASTRODUODENOSCOPY (EGD) WITH PROPOFOL
Anesthesia: Monitor Anesthesia Care

## 2021-09-16 NOTE — Anesthesia Preprocedure Evaluation (Signed)
Anesthesia Evaluation  Patient identified by MRN, date of birth, ID band Patient awake    Reviewed: Allergy & Precautions, H&P , NPO status , Patient's Chart, lab work & pertinent test results  History of Anesthesia Complications Negative for: history of anesthetic complications  Airway Mallampati: II  TM Distance: >3 FB Neck ROM: Full    Dental no notable dental hx. (+) Missing, Dental Advisory Given,    Pulmonary sleep apnea (no CPAP since gastric bypass) ,    Pulmonary exam normal breath sounds clear to auscultation       Cardiovascular hypertension, Pt. on medications Normal cardiovascular exam Rhythm:Regular Rate:Normal  TTE 2019: EF 55%-60%, normal valves   Neuro/Psych  Headaches, Anxiety Depression    GI/Hepatic Neg liver ROS, GERD  Medicated,  Endo/Other  Morbid obesity (BMI 48)S/P gastric bypass 2019  Renal/GU negative Renal ROS  negative genitourinary   Musculoskeletal  (+) Arthritis ,   Abdominal   Peds  Hematology negative hematology ROS (+)   Anesthesia Other Findings   Reproductive/Obstetrics negative OB ROS                             Anesthesia Physical  Anesthesia Plan  ASA: 3  Anesthesia Plan: MAC   Post-op Pain Management: Minimal or no pain anticipated   Induction: Intravenous  PONV Risk Score and Plan: 3 and Treatment may vary due to age or medical condition and Propofol infusion  Airway Management Planned: Simple Face Mask  Additional Equipment: None  Intra-op Plan:   Post-operative Plan:   Informed Consent: I have reviewed the patients History and Physical, chart, labs and discussed the procedure including the risks, benefits and alternatives for the proposed anesthesia with the patient or authorized representative who has indicated his/her understanding and acceptance.     Dental advisory given  Plan Discussed with: CRNA and  Anesthesiologist  Anesthesia Plan Comments: (Patient seen in PAT due to BMI >45. Reassuring airway exam as noted. General questions regarding anesthesia answered. Patient informed that anesthetic plan will be determined by her anesthesiologist on day of procedure. Daiva Huge, MD)        Anesthesia Quick Evaluation

## 2021-09-19 ENCOUNTER — Encounter (HOSPITAL_COMMUNITY): Payer: Self-pay | Admitting: *Deleted

## 2021-10-25 DIAGNOSIS — N201 Calculus of ureter: Secondary | ICD-10-CM | POA: Diagnosis not present

## 2021-10-25 DIAGNOSIS — N134 Hydroureter: Secondary | ICD-10-CM | POA: Diagnosis not present

## 2021-10-25 DIAGNOSIS — R1084 Generalized abdominal pain: Secondary | ICD-10-CM | POA: Diagnosis not present

## 2021-10-25 DIAGNOSIS — N23 Unspecified renal colic: Secondary | ICD-10-CM | POA: Diagnosis not present

## 2021-10-25 DIAGNOSIS — R110 Nausea: Secondary | ICD-10-CM | POA: Diagnosis not present

## 2021-10-25 DIAGNOSIS — Z79899 Other long term (current) drug therapy: Secondary | ICD-10-CM | POA: Diagnosis not present

## 2021-10-25 DIAGNOSIS — R1032 Left lower quadrant pain: Secondary | ICD-10-CM | POA: Diagnosis not present

## 2021-10-25 DIAGNOSIS — Z9884 Bariatric surgery status: Secondary | ICD-10-CM | POA: Diagnosis not present

## 2021-10-25 DIAGNOSIS — N281 Cyst of kidney, acquired: Secondary | ICD-10-CM | POA: Diagnosis not present

## 2021-10-25 DIAGNOSIS — Z87440 Personal history of urinary (tract) infections: Secondary | ICD-10-CM | POA: Diagnosis not present

## 2021-10-25 DIAGNOSIS — Z7985 Long-term (current) use of injectable non-insulin antidiabetic drugs: Secondary | ICD-10-CM | POA: Diagnosis not present

## 2021-10-25 DIAGNOSIS — E785 Hyperlipidemia, unspecified: Secondary | ICD-10-CM | POA: Diagnosis not present

## 2021-10-25 DIAGNOSIS — I10 Essential (primary) hypertension: Secondary | ICD-10-CM | POA: Diagnosis not present

## 2021-10-25 DIAGNOSIS — N3001 Acute cystitis with hematuria: Secondary | ICD-10-CM | POA: Diagnosis not present

## 2021-10-25 DIAGNOSIS — N136 Pyonephrosis: Secondary | ICD-10-CM | POA: Diagnosis not present

## 2021-10-25 DIAGNOSIS — N179 Acute kidney failure, unspecified: Secondary | ICD-10-CM | POA: Diagnosis not present

## 2021-10-25 DIAGNOSIS — E119 Type 2 diabetes mellitus without complications: Secondary | ICD-10-CM | POA: Diagnosis not present

## 2021-10-25 DIAGNOSIS — G4733 Obstructive sleep apnea (adult) (pediatric): Secondary | ICD-10-CM | POA: Diagnosis not present

## 2021-10-25 DIAGNOSIS — K219 Gastro-esophageal reflux disease without esophagitis: Secondary | ICD-10-CM | POA: Diagnosis not present

## 2021-10-25 DIAGNOSIS — N132 Hydronephrosis with renal and ureteral calculous obstruction: Secondary | ICD-10-CM | POA: Diagnosis not present

## 2021-10-25 DIAGNOSIS — R52 Pain, unspecified: Secondary | ICD-10-CM | POA: Diagnosis not present

## 2021-10-26 DIAGNOSIS — N201 Calculus of ureter: Secondary | ICD-10-CM | POA: Diagnosis not present

## 2021-10-26 DIAGNOSIS — N179 Acute kidney failure, unspecified: Secondary | ICD-10-CM | POA: Diagnosis not present

## 2021-10-26 DIAGNOSIS — N134 Hydroureter: Secondary | ICD-10-CM | POA: Diagnosis not present

## 2021-10-26 DIAGNOSIS — N23 Unspecified renal colic: Secondary | ICD-10-CM | POA: Diagnosis not present

## 2021-10-26 DIAGNOSIS — R1032 Left lower quadrant pain: Secondary | ICD-10-CM | POA: Diagnosis not present

## 2021-10-27 DIAGNOSIS — R1032 Left lower quadrant pain: Secondary | ICD-10-CM | POA: Diagnosis not present

## 2021-10-28 DIAGNOSIS — N132 Hydronephrosis with renal and ureteral calculous obstruction: Secondary | ICD-10-CM | POA: Diagnosis not present

## 2021-11-07 ENCOUNTER — Encounter: Payer: Self-pay | Admitting: Family

## 2021-11-08 MED ORDER — GABAPENTIN 300 MG PO CAPS: 300.0000 mg | ORAL_CAPSULE | Freq: Three times a day (TID) | ORAL | 2 refills | Status: DC

## 2021-11-26 ENCOUNTER — Encounter: Payer: Self-pay | Admitting: Family

## 2021-11-26 DIAGNOSIS — I10 Essential (primary) hypertension: Secondary | ICD-10-CM

## 2021-11-26 MED ORDER — BUPROPION HCL ER (XL) 300 MG PO TB24: 300.0000 mg | ORAL_TABLET | Freq: Every day | ORAL | 0 refills | Status: DC

## 2021-11-26 MED ORDER — METOPROLOL SUCCINATE ER 50 MG PO TB24: 50.0000 mg | ORAL_TABLET | Freq: Every day | ORAL | 0 refills | Status: DC

## 2021-11-26 MED ORDER — GABAPENTIN 300 MG PO CAPS: 300.0000 mg | ORAL_CAPSULE | Freq: Three times a day (TID) | ORAL | 0 refills | Status: DC

## 2021-11-26 MED ORDER — LISINOPRIL 20 MG PO TABS: 20.0000 mg | ORAL_TABLET | Freq: Every day | ORAL | 0 refills | Status: DC

## 2021-11-26 MED ORDER — AMLODIPINE BESYLATE 10 MG PO TABS: 10.0000 mg | ORAL_TABLET | Freq: Every day | ORAL | 0 refills | Status: DC

## 2021-11-26 MED ORDER — MOUNJARO 7.5 MG/0.5ML ~~LOC~~ SOAJ: 7.5000 mg | SUBCUTANEOUS | 0 refills | Status: DC

## 2021-11-26 MED ORDER — VENLAFAXINE HCL ER 150 MG PO CP24: 150.0000 mg | ORAL_CAPSULE | Freq: Every day | ORAL | 0 refills | Status: DC

## 2021-11-26 MED ORDER — OMEPRAZOLE 40 MG PO CPDR: 40.0000 mg | DELAYED_RELEASE_CAPSULE | Freq: Every day | ORAL | 0 refills | Status: DC

## 2021-12-03 ENCOUNTER — Other Ambulatory Visit: Payer: Self-pay | Admitting: Family

## 2021-12-07 ENCOUNTER — Other Ambulatory Visit: Payer: Self-pay | Admitting: Family

## 2021-12-07 DIAGNOSIS — I10 Essential (primary) hypertension: Secondary | ICD-10-CM

## 2021-12-08 MED ORDER — LISINOPRIL 20 MG PO TABS: 20.0000 mg | ORAL_TABLET | Freq: Every day | ORAL | 0 refills | Status: DC

## 2021-12-16 ENCOUNTER — Encounter: Payer: Self-pay | Admitting: Family

## 2021-12-20 DIAGNOSIS — M25561 Pain in right knee: Secondary | ICD-10-CM | POA: Diagnosis not present

## 2021-12-20 DIAGNOSIS — M25562 Pain in left knee: Secondary | ICD-10-CM | POA: Diagnosis not present

## 2021-12-20 DIAGNOSIS — G4733 Obstructive sleep apnea (adult) (pediatric): Secondary | ICD-10-CM | POA: Diagnosis not present

## 2021-12-25 ENCOUNTER — Telehealth: Payer: Self-pay | Admitting: Family

## 2021-12-25 NOTE — Telephone Encounter (Signed)
[facility] called to see if we received medical clearance form for patient but I didn't see any documentation that it has been received yet. Cassie will refax the form so it can be forwarded to the doctor. She said they are waiting until they receive the form back to get her procedure rescheduled.

## 2021-12-25 NOTE — Telephone Encounter (Signed)
Patient called to advise that [facility] is waiting on the medical clearance but she does not understand what the problem is because she only went in there for a filling but they now are saying she needs to have two teeth extracted but will not prescribe anything greater than OTC. Patient is concerned that she will be in a lot of pain and pain increases her BP which is already high. Please call patient to discuss.    Medical Clearance is provider box and [address].

## 2021-12-26 NOTE — Telephone Encounter (Signed)
Called but no answer and mailbox was full 

## 2021-12-29 ENCOUNTER — Telehealth: Payer: Self-pay | Admitting: Family

## 2021-12-29 ENCOUNTER — Other Ambulatory Visit: Payer: Self-pay | Admitting: Family

## 2021-12-29 NOTE — Telephone Encounter (Signed)
Please contact pt to schedule follow up.  

## 2021-12-30 NOTE — Telephone Encounter (Signed)
Pt stated she will have to call back to sched.

## 2021-12-31 NOTE — Telephone Encounter (Signed)
Called but no answer, her clearance forms have been faxed to the dentist office.

## 2022-01-04 ENCOUNTER — Other Ambulatory Visit: Payer: Self-pay | Admitting: Family

## 2022-01-07 DIAGNOSIS — M25551 Pain in right hip: Secondary | ICD-10-CM | POA: Diagnosis not present

## 2022-01-13 ENCOUNTER — Other Ambulatory Visit: Payer: Self-pay | Admitting: Family

## 2022-01-27 ENCOUNTER — Other Ambulatory Visit: Payer: Self-pay | Admitting: Family

## 2022-02-05 ENCOUNTER — Other Ambulatory Visit: Payer: Self-pay | Admitting: Family

## 2022-02-05 DIAGNOSIS — I10 Essential (primary) hypertension: Secondary | ICD-10-CM

## 2022-02-10 ENCOUNTER — Encounter: Payer: Self-pay | Admitting: Family

## 2022-02-18 DIAGNOSIS — L608 Other nail disorders: Secondary | ICD-10-CM | POA: Diagnosis not present

## 2022-02-18 DIAGNOSIS — M19072 Primary osteoarthritis, left ankle and foot: Secondary | ICD-10-CM | POA: Diagnosis not present

## 2022-02-18 DIAGNOSIS — M67471 Ganglion, right ankle and foot: Secondary | ICD-10-CM | POA: Diagnosis not present

## 2022-02-18 DIAGNOSIS — B351 Tinea unguium: Secondary | ICD-10-CM | POA: Diagnosis not present

## 2022-02-18 DIAGNOSIS — M19071 Primary osteoarthritis, right ankle and foot: Secondary | ICD-10-CM | POA: Diagnosis not present

## 2022-02-20 ENCOUNTER — Other Ambulatory Visit: Payer: Self-pay | Admitting: Family

## 2022-03-01 ENCOUNTER — Other Ambulatory Visit: Payer: Self-pay | Admitting: Family

## 2022-03-01 DIAGNOSIS — I10 Essential (primary) hypertension: Secondary | ICD-10-CM

## 2022-03-16 ENCOUNTER — Other Ambulatory Visit: Payer: Self-pay | Admitting: Family

## 2022-03-28 ENCOUNTER — Other Ambulatory Visit: Payer: Self-pay | Admitting: Family

## 2022-04-07 ENCOUNTER — Encounter: Payer: Self-pay | Admitting: *Deleted

## 2022-04-07 ENCOUNTER — Other Ambulatory Visit: Payer: Self-pay | Admitting: Family

## 2022-04-21 NOTE — Progress Notes (Incomplete)
Subjective:   By signing my name below, I, Hailey Vance, attest that this documentation has been prepared under the direction and in the presence of Hailey Pear, NP 04/21/22   Patient ID: Hailey Vance, female    DOB: Jun 11, 1965, 57 y.o.   MRN: [id number]  No chief complaint on file.   HPI Patient is in today for an office visit.***   Past Medical History:  Diagnosis Date   Bilateral primary osteoarthritis of knee 03/29/2019   Chronic back pain    Cystocele with rectocele    Generalized anxiety disorder    GERD (gastroesophageal reflux disease)    Headache    otc med prn   Heart murmur    echo in epic 11-26-2017  normal w/ ef 55-60%   History of chest pain    pt referred for atyical chest pain , cardiology evaluation w/ dr berry note in epic 10-25-2014, stated with low risk w/ no ischemia , nuclear ef 70% , done 09-19-2014 in epic,  non-cardiac chest pain   HTN (hypertension)    followed by pcp    (normal nuclear stress test 09-19-2014 in epic)   Low blood potassium    Major depressive disorder    OSA (obstructive sleep apnea)    04/2015 study in e- Severe OSA (AHI = 55.0/hour);   (10-06-2019  per pt has not used cpap since 2019 after gastric bypass)   Osteoarthritis of both hips 06/15/2019   Plantar fasciitis of left foot    S/P gastric bypass 02/15/2018   sleeve   SUI (stress urinary incontinence, female)    Vitamin D deficiency 09/06/2014   Wears glasses     Past Surgical History:  Procedure Laterality Date   BLADDER SUSPENSION N/A 10/12/2019   Procedure: TRANSVAGINAL TAPE (TVT) PROCEDURE;  Surgeon: Bobbye Charleston, MD;  Location: [facility];  Service: Gynecology;  Laterality: N/A;   CESAREAN SECTION W/BTL  1990   COLONOSCOPY WITH PROPOFOL N/A 08/30/2015   Procedure: COLONOSCOPY WITH PROPOFOL;  Surgeon: Mauri Pole, MD;  Location: [facility];  Service: Endoscopy;  Laterality: N/A;   CYSTOCELE REPAIR N/A 10/12/2019   Procedure:  ANTERIOR (CYSTOCELE);  Surgeon: Bobbye Charleston, MD;  Location: [facility];  Service: Gynecology;  Laterality: N/A;   CYSTOSCOPY N/A 05/23/2015   Procedure: CYSTOSCOPY;  Surgeon: Bobbye Charleston, MD;  Location: [facility];  Service: Gynecology;  Laterality: N/A;   CYSTOSCOPY N/A 10/12/2019   Procedure: CYSTOSCOPY;  Surgeon: Bobbye Charleston, MD;  Location: [facility];  Service: Gynecology;  Laterality: N/A;   HIATAL HERNIA REPAIR N/A 02/15/2018   Procedure: HERNIA REPAIR HIATAL;  Surgeon: Excell Seltzer, MD;  Location: [facility];  Service: General;  Laterality: N/A;   HYSTEROSCOPY WITH D & C N/A 01/11/2015   Procedure: DILATATION AND CURETTAGE /HYSTEROSCOPY;  Surgeon: Jerelyn Charles, MD;  Location: [facility];  Service: Gynecology;  Laterality: N/A;   KNEE ARTHROSCOPY Right 2010   LAPAROSCOPIC GASTRIC SLEEVE RESECTION N/A 02/15/2018   Procedure: LAPAROSCOPIC GASTRIC SLEEVE RESECTION WITH UPPER ENDO AND ERAS PATHWAY;  Surgeon: Excell Seltzer, MD;  Location: [facility];  Service: General;  Laterality: N/A;   LAPAROSCOPIC OOPHORECTOMY Bilateral 2004   LEFT OOPHORECTOMY AND PARTIAL RIGHT OOPHORECTOMY   LIPOMA EXCISION Left 12/02/2016   Procedure: EXCISION LIPOMA OF LEFT SHOULDER;  Surgeon: Garald Balding, MD;  Location: [facility];  Service: Orthopedics;  Laterality: Left;   ROBOTIC ASSISTED TOTAL HYSTERECTOMY WITH SALPINGECTOMY Bilateral 05/23/2015   Procedure:  ROBOTIC ASSISTED TOTAL HYSTERECTOMY WITH SALPINGECTOMYwith right oophorectomy;  Surgeon: Bobbye Charleston, MD;  Location: [facility];  Service: Gynecology;  Laterality: Bilateral;   UMBILICAL HERNIA REPAIR  2002    Family History  Problem Relation Age of Onset   Lung cancer Mother 62       dec   Cancer Mother    Hypertension Mother    Arthritis Mother    Cancer Father 32       prostate   Hypertension Father    Leukemia Sister    Hypertension Sister    Arthritis Sister    Hyperlipidemia Sister    Hypertension  Brother    Arthritis Brother    Multiple sclerosis Sister    Hypertension Sister    Hypertension Sister    Hypertension Brother    Hypertension Brother    Breast cancer Paternal Aunt    Alzheimer's disease Paternal Aunt    Diabetes Maternal Grandmother    Stroke Maternal Grandmother    Heart attack Paternal Grandmother    Asthma Other     Social History   Socioeconomic History   Marital status: Divorced    Spouse name: Not on file   Number of children: Not on file   Years of education: 12   Highest education level: High school graduate  Occupational History   Occupation: Disability  Tobacco Use   Smoking status: Never   Smokeless tobacco: Never  Vaping Use   Vaping Use: Never used  Substance and Sexual Activity   Alcohol use: No    Alcohol/week: 0.0 standard drinks of alcohol   Drug use: Never   Sexual activity: Not on file    Comment: BTL w/ C/S in 1990  Other Topics Concern   Not on file  Social History Narrative   Divorced   Lives with son and grand-daughter   68- Son [name] (lives in [place])   [place] lives with patient   74- Daughter [name] (lives in [place])   Works at [employer]   Completed some college   Careers adviser   Social Determinants of Health   Financial Resource Strain: [place]  (08/05/2021)   Overall Financial Resource Strain (CARDIA)    Difficulty of Paying Living Expenses: Not hard at all  Food Insecurity: No Food Insecurity (08/05/2021)   Hunger Vital Sign    Worried About Running Out of Food in the Last Year: Never true    Ran Out of Food in the Last Year: Never true  Transportation Needs: No Transportation Needs (08/05/2021)   PRAPARE - Hydrologist (Medical): No    Lack of Transportation (Non-Medical): No  Physical Activity: Inactive (08/05/2021)   Exercise Vital Sign    Days of Exercise per Week: 0 days    Minutes of Exercise per Session: 0 min  Stress: No Stress  Concern Present (08/05/2021)   [place]    Feeling of Stress : Not at all  Social Connections: Socially Isolated (08/05/2021)   Social Connection and Isolation Panel [NHANES]    Frequency of Communication with Friends and Family: Once a week    Frequency of Social Gatherings with Friends and Family: More than three times a week    Attends Religious Services: Never    Marine scientist or Organizations: No    Attends Archivist Meetings: Never    Marital Status: Divorced  Human resources officer Violence:  Not At Risk (08/05/2021)   Humiliation, Afraid, Rape, and Kick questionnaire    Fear of Current or Ex-Partner: No    Emotionally Abused: No    Physically Abused: No    Sexually Abused: No    Outpatient Medications Prior to Visit  Medication Sig Dispense Refill   albuterol (VENTOLIN HFA) 108 (90 Base) MCG/ACT inhaler INHALE 2 PUFFS BY MOUTH EVERY 6 HOURS AS NEEDED FOR WHEEZING FOR SHORTNESS OF BREATH 9 g 0   amLODipine (NORVASC) 10 MG tablet TAKE 1 TABLET BY MOUTH EVERY DAY 90 tablet 0   Artificial Tear Ointment (DRY EYES OP) Apply to eye as needed.     buPROPion (WELLBUTRIN XL) 300 MG 24 hr tablet TAKE 1 TABLET BY MOUTH ONCE DAILY (LAB APPOINTMENT REQUIRED FOR FURTHER REFILLS) 30 tablet 0   Ca Phosphate-Cholecalciferol (CALTRATE GUMMY BITES PO) Take by mouth daily.      Cholecalciferol (VITAMIN D3) 50 MCG (2000 UT) CHEW Chew 8,000 Units by mouth daily.     diclofenac Sodium (VOLTAREN) 1 % GEL Apply 2 g topically daily as needed. 100 g 2   famotidine (PEPCID) 20 MG tablet Take 1 tablet (20 mg total) by mouth at bedtime.     furosemide (LASIX) 20 MG tablet Take 1 tablet (20 mg total) by mouth daily as needed for edema. 30 tablet 3   gabapentin (NEURONTIN) 300 MG capsule Take 1 capsule (300 mg total) by mouth 3 (three) times daily. 90 capsule 0   lisinopril (ZESTRIL) 20 MG tablet TAKE 1 TABLET BY MOUTH EVERY DAY 90  tablet 1   Menthol, Topical Analgesic, 4 % GEL Apply 1 application topically daily.     metoprolol succinate (TOPROL-XL) 50 MG 24 hr tablet TAKE 1 TABLET BY MOUTH EVERY DAY WITH OR IMMEDIATELY FOLLOWING A MEAL 90 tablet 1   multivitamin-iron-minerals-folic acid (CENTRUM) chewable tablet Chew 1 tablet by mouth daily.     nitrofurantoin, macrocrystal-monohydrate, (MACROBID) 100 MG capsule Take 1 capsule (100 mg total) by mouth 2 (two) times daily. 10 capsule 0   nystatin (MYCOSTATIN/NYSTOP) powder Apply 1 application topically 2 (two) times daily. 60 g 2   omeprazole (PRILOSEC) 40 MG capsule TAKE 1 CAPSULE (40 MG TOTAL) BY MOUTH DAILY. 30 capsule 5   tirzepatide (MOUNJARO) 7.5 MG/0.5ML Pen INJECT 7.5 MG SUBCUTANEOUSLY WEEKLY 2 mL 0   venlafaxine XR (EFFEXOR-XR) 150 MG 24 hr capsule TAKE 1 CAPSULE BY MOUTH DAILY WITH BREAKFAST. 90 capsule 0   No facility-administered medications prior to visit.    Allergies  Allergen Reactions   Penicillins Itching and Other (See Comments)    Has patient had a PCN reaction causing immediate rash, facial/tongue/throat swelling, SOB or lightheadedness with hypotension: No Has patient had a PCN reaction causing severe rash involving mucus membranes or skin necrosis: No Has patient had a PCN reaction that required hospitalization No Has patient had a PCN reaction occurring within the last 10 years: No If all of the above answers are "NO", then may proceed with Cephalosporin use.     ROS     Objective:    Physical Exam  LMP 10/02/2007  Wt Readings from Last 3 Encounters:  08/05/21 (!) 324 lb (147 kg)  07/24/21 (!) 324 lb (147 kg)  06/26/21 (!) 324 lb (147 kg)       Assessment & Plan:  There are no diagnoses linked to this encounter.   I,Rachel Rivera,acting as a Education administrator for [employer], NP.,have documented all relevant documentation on the  behalf of Hailey Pear, NP,as directed by  Hailey Pear, NP while in the presence of  Hailey Pear, NP.   I, Hailey Vance, personally preformed the services described in this documentation.  All medical record entries made by the scribe were at my direction and in my presence.  I have reviewed the chart and discharge instructions (if applicable) and agree that the record reflects my personal performance and is accurate and complete. 04/21/22   Hailey Vance

## 2022-04-22 ENCOUNTER — Ambulatory Visit: Payer: 59 | Admitting: Family

## 2022-04-22 ENCOUNTER — Telehealth: Payer: Self-pay | Admitting: Family

## 2022-04-22 NOTE — Telephone Encounter (Signed)
Please send no show letter to patient.

## 2022-04-23 NOTE — Telephone Encounter (Signed)
Letter sent.

## 2022-04-28 ENCOUNTER — Other Ambulatory Visit: Payer: Self-pay | Admitting: Family

## 2022-05-01 DIAGNOSIS — M170 Bilateral primary osteoarthritis of knee: Secondary | ICD-10-CM | POA: Diagnosis not present

## 2022-05-01 DIAGNOSIS — M25561 Pain in right knee: Secondary | ICD-10-CM | POA: Diagnosis not present

## 2022-05-01 DIAGNOSIS — M25562 Pain in left knee: Secondary | ICD-10-CM | POA: Diagnosis not present

## 2022-05-08 ENCOUNTER — Other Ambulatory Visit: Payer: Self-pay | Admitting: Family

## 2022-05-12 ENCOUNTER — Ambulatory Visit: Payer: 59 | Admitting: Family

## 2022-05-12 ENCOUNTER — Encounter: Payer: Self-pay | Admitting: Family

## 2022-05-14 ENCOUNTER — Telehealth: Payer: Self-pay | Admitting: Family

## 2022-05-14 ENCOUNTER — Ambulatory Visit (INDEPENDENT_AMBULATORY_CARE_PROVIDER_SITE_OTHER): Payer: 59 | Admitting: Family

## 2022-05-14 ENCOUNTER — Ambulatory Visit (HOSPITAL_BASED_OUTPATIENT_CLINIC_OR_DEPARTMENT_OTHER)
Admission: RE | Admit: 2022-05-14 | Discharge: 2022-05-14 | Disposition: A | Discharge status: Home / Self Care | Payer: 59 | Source: Ambulatory Visit | Attending: Family | Admitting: Family

## 2022-05-14 ENCOUNTER — Ambulatory Visit: Payer: 59 | Admitting: Family

## 2022-05-14 VITALS — BP 158/80 | HR 61 | Temp 98.5°F | Resp 16 | Wt 272.0 lb

## 2022-05-14 DIAGNOSIS — K219 Gastro-esophageal reflux disease without esophagitis: Secondary | ICD-10-CM | POA: Insufficient documentation

## 2022-05-14 DIAGNOSIS — E538 Deficiency of other specified B group vitamins: Secondary | ICD-10-CM

## 2022-05-14 DIAGNOSIS — G4733 Obstructive sleep apnea (adult) (pediatric): Secondary | ICD-10-CM

## 2022-05-14 DIAGNOSIS — E559 Vitamin D deficiency, unspecified: Secondary | ICD-10-CM | POA: Diagnosis not present

## 2022-05-14 DIAGNOSIS — Z87442 Personal history of urinary calculi: Secondary | ICD-10-CM | POA: Insufficient documentation

## 2022-05-14 DIAGNOSIS — M5416 Radiculopathy, lumbar region: Secondary | ICD-10-CM

## 2022-05-14 DIAGNOSIS — E785 Hyperlipidemia, unspecified: Secondary | ICD-10-CM | POA: Diagnosis not present

## 2022-05-14 DIAGNOSIS — N2889 Other specified disorders of kidney and ureter: Secondary | ICD-10-CM | POA: Diagnosis not present

## 2022-05-14 DIAGNOSIS — I10 Essential (primary) hypertension: Secondary | ICD-10-CM

## 2022-05-14 DIAGNOSIS — F331 Major depressive disorder, recurrent, moderate: Secondary | ICD-10-CM | POA: Diagnosis not present

## 2022-05-14 DIAGNOSIS — Z6841 Body Mass Index (BMI) 40.0 and over, adult: Secondary | ICD-10-CM

## 2022-05-14 LAB — LIPID PANEL
Cholesterol: 200 mg/dL (ref 0–200)
HDL: 82.7 mg/dL (ref >39.00)
LDL Cholesterol: 104 mg/dL — ABNORMAL HIGH (ref 0–99)
NonHDL: 116.85
Total CHOL/HDL Ratio: 2
Triglycerides: 66 mg/dL (ref 0.0–149.0)
VLDL: 13.2 mg/dL (ref 0.0–40.0)

## 2022-05-14 LAB — COMPREHENSIVE METABOLIC PANEL
ALT: 17 U/L (ref 0–35)
AST: 17 U/L (ref 0–37)
Albumin: 3.6 g/dL (ref 3.5–5.2)
Alkaline Phosphatase: 94 U/L (ref 39–117)
BUN: 16 mg/dL (ref 6–23)
CO2: 30 mEq/L (ref 19–32)
Calcium: 9.1 mg/dL (ref 8.4–10.5)
Chloride: 102 mEq/L (ref 96–112)
Creatinine, Ser: 1.13 mg/dL (ref 0.40–1.20)
GFR: 53.73 mL/min — ABNORMAL LOW (ref >60.00)
Glucose, Bld: 78 mg/dL (ref 70–99)
Potassium: 4.1 mEq/L (ref 3.5–5.1)
Sodium: 139 mEq/L (ref 135–145)
Total Bilirubin: 0.6 mg/dL (ref 0.2–1.2)
Total Protein: 6.4 g/dL (ref 6.0–8.3)

## 2022-05-14 LAB — VITAMIN D 25 HYDROXY (VIT D DEFICIENCY, FRACTURES): VITD: 10.38 ng/mL — ABNORMAL LOW (ref 30.00–100.00)

## 2022-05-14 LAB — VITAMIN B12: Vitamin B-12: 256 pg/mL (ref 211–911)

## 2022-05-14 MED ORDER — VITAMIN B-12 1000 MCG PO TABS: 1000.0000 ug | ORAL_TABLET | Freq: Every day | ORAL | Status: DC

## 2022-05-14 MED ORDER — BUPROPION HCL ER (XL) 300 MG PO TB24: ORAL_TABLET | ORAL | 1 refills | Status: DC

## 2022-05-14 MED ORDER — METOPROLOL SUCCINATE ER 50 MG PO TB24: ORAL_TABLET | ORAL | 1 refills | Status: DC

## 2022-05-14 MED ORDER — VENLAFAXINE HCL ER 150 MG PO CP24: 150.0000 mg | ORAL_CAPSULE | Freq: Every day | ORAL | 1 refills | Status: DC

## 2022-05-14 MED ORDER — NYSTATIN 100000 UNIT/GM EX POWD: 1.0000 | Freq: Two times a day (BID) | CUTANEOUS | 2 refills | Status: DC

## 2022-05-14 MED ORDER — FUROSEMIDE 20 MG PO TABS: 20.0000 mg | ORAL_TABLET | Freq: Every day | ORAL | 3 refills | Status: DC | PRN

## 2022-05-14 MED ORDER — LISINOPRIL 30 MG PO TABS: 30.0000 mg | ORAL_TABLET | Freq: Every day | ORAL | 0 refills | Status: DC

## 2022-05-14 MED ORDER — GABAPENTIN 300 MG PO CAPS: 300.0000 mg | ORAL_CAPSULE | Freq: Three times a day (TID) | ORAL | 1 refills | Status: DC

## 2022-05-14 MED ORDER — ALBUTEROL SULFATE HFA 108 (90 BASE) MCG/ACT IN AERS: INHALATION_SPRAY | RESPIRATORY_TRACT | 1 refills | Status: DC

## 2022-05-14 MED ORDER — AMLODIPINE BESYLATE 10 MG PO TABS: 10.0000 mg | ORAL_TABLET | Freq: Every day | ORAL | 1 refills | Status: DC

## 2022-05-14 MED ORDER — TIRZEPATIDE 10 MG/0.5ML ~~LOC~~ SOAJ: 10.0000 mg | SUBCUTANEOUS | 2 refills | Status: DC

## 2022-05-14 MED ORDER — VITAMIN D (ERGOCALCIFEROL) 1.25 MG (50000 UNIT) PO CAPS: 50000.0000 [IU] | ORAL_CAPSULE | ORAL | 0 refills | Status: DC

## 2022-05-14 NOTE — Assessment & Plan Note (Signed)
She is maintained on Wellbutrin '300mg'$  once daily. Overall stable. Continue same.

## 2022-05-14 NOTE — Assessment & Plan Note (Signed)
Uncontrolled. Will increase lisinopril to '30mg'$  once daily. Continue amlodipine and metoprolol at current doses.

## 2022-05-14 NOTE — Assessment & Plan Note (Signed)
Lab Results  Component Value Date   CHOL 199 08/22/2020   HDL 83.00 08/22/2020   LDLCALC 93 08/22/2020   TRIG 115.0 08/22/2020   CHOLHDL 2 08/22/2020

## 2022-05-14 NOTE — Telephone Encounter (Signed)
Electronic request sent

## 2022-05-14 NOTE — Assessment & Plan Note (Addendum)
Wt Readings from Last 3 Encounters:  05/14/22 272 lb (123.4 kg)  08/05/21 (!) 324 lb (147 kg)  07/24/21 (!) 324 lb (147 kg)   Continue walking as able.  She has improved her diet.  She is tolerating mounjaro 7.'5mg'$  weekly.  Will increase mounjaro dosing to '10mg'$ .

## 2022-05-14 NOTE — Assessment & Plan Note (Signed)
Stable on omeprazole. 

## 2022-05-14 NOTE — Assessment & Plan Note (Signed)
Pain is improved with gabapentin '300mg'$  tid.

## 2022-05-14 NOTE — Assessment & Plan Note (Signed)
Stable on cpap, continue same.

## 2022-05-14 NOTE — Telephone Encounter (Signed)
Please request copy of pap from [facility] office.

## 2022-05-14 NOTE — Assessment & Plan Note (Signed)
She had a hospitalization for kidney stones back in 2023 and was noted to have a non-obstructive ureteral stone on the right at discharge. She was advised at discharge to follow up with Urology but she did not.  Will obtain KUB to evaluate for residual stone (s).

## 2022-05-14 NOTE — Telephone Encounter (Signed)
I sent her a mychart on her x-ray, no kidney stones blocking her urine.   Vitamin D level is low.  Advise patient to begin vit D 50000 units once weekly for 12 weeks, then repeat vit D level (dx Vit D deficiency).    B12 is also low normal. Please add b12  1090mg PO once daily otc and repeat b12 in 12 weeks.   Both of these things could be contributing to her fatigue.   Cholesterol looks good.

## 2022-05-14 NOTE — Progress Notes (Signed)
Subjective:   By signing my name below, I, Hailey Vance, attest that this documentation has been prepared under the direction and in the presence of Hailey Alar, NP. 05/14/2022   Patient ID: Hailey Vance, female    DOB: 1965/11/11, 57 y.o.   MRN: [id number]  Chief Complaint  Patient presents with   Hypertension    Here for follow up   Obesity    Here for follow up    HPI Patient is in today for a follow-up appointment.   Blood pressure: Her blood pressure is elevated during this visit. She is taking 20 mg lisinopril daily PO and 10 mg amlodipine daily PO to manage it.   BP Readings from Last 3 Encounters:  05/14/22 (!) 158/80  07/10/21 (!) 132/93  06/26/21 (!) 151/83    Pulse Readings from Last 3 Encounters:  05/14/22 61  07/10/21 80  06/26/21 87    Kidney stones: She has a history of having two kidney stones since July. One of the stones has presumably passed, but the other one has potentially not yet passed.   Weight: She has lost weight since her previous visit. She needs to be 250 lbs to get her orthopedic surgery. She is taking 7.5 mg mounjaro injections weekly. She is walking more frequently than previously. She is following a mostly balanced diet.  Wt Readings from Last 3 Encounters:  05/14/22 272 lb (123.4 kg)  08/05/21 (!) 324 lb (147 kg)  07/24/21 (!) 324 lb (147 kg)    Mood: Her mood has improved since taking 3000 mg Wellbutrin XL, but weekly still experiences times where she is easily agitated.   Leg pain: She is taking 300 mg gabapentin daily PO to manage nerve pain in her leg.   B12/Vitamin D: She is taking a centrum multivitamin daily.   Acid reflux: She is taking 40 mg Prilosec daily PO to manage acid reflux.   CPAP: She wears a CPAP regularly to manage her sleep apnea.   Immunizations: She has not yet received the influenza immunization or the latest Covid-19 immunization.   Pap smear: She received a pap smear last year.   Past  Medical History:  Diagnosis Date   Bilateral primary osteoarthritis of knee 03/29/2019   Chronic back pain    Cystocele with rectocele    Generalized anxiety disorder    GERD (gastroesophageal reflux disease)    Headache    otc med prn   Heart murmur    echo in epic 11-26-2017  normal w/ ef 55-60%   History of chest pain    pt referred for atyical chest pain , cardiology evaluation w/ dr berry note in epic 10-25-2014, stated with low risk w/ no ischemia , nuclear ef 70% , done 09-19-2014 in epic,  non-cardiac chest pain   HTN (hypertension)    followed by pcp    (normal nuclear stress test 09-19-2014 in epic)   Low blood potassium    Major depressive disorder    OSA (obstructive sleep apnea)    04/2015 study in e- Severe OSA (AHI = 55.0/hour);   (10-06-2019  per pt has not used cpap since 2019 after gastric bypass)   Osteoarthritis of both hips 06/15/2019   Plantar fasciitis of left foot    S/P gastric bypass 02/15/2018   sleeve   SUI (stress urinary incontinence, female)    Vitamin D deficiency 09/06/2014   Wears glasses     Past Surgical History:  Procedure Laterality Date  BLADDER SUSPENSION N/A 10/12/2019   Procedure: TRANSVAGINAL TAPE (TVT) PROCEDURE;  Surgeon: Bobbye Charleston, MD;  Location: [facility];  Service: Gynecology;  Laterality: N/A;   CESAREAN SECTION W/BTL  1990   COLONOSCOPY WITH PROPOFOL N/A 08/30/2015   Procedure: COLONOSCOPY WITH PROPOFOL;  Surgeon: Mauri Pole, MD;  [facility];  Service: Endoscopy;  Laterality: N/A;   CYSTOCELE REPAIR N/A 10/12/2019   Procedure: ANTERIOR (CYSTOCELE);  Surgeon: Bobbye Charleston, MD;  Location: [facility];  Service: Gynecology;  Laterality: N/A;   CYSTOSCOPY N/A 05/23/2015   Procedure: CYSTOSCOPY;  Surgeon: Bobbye Charleston, MD;  Location: [facility];  Service: Gynecology;  Laterality: N/A;   CYSTOSCOPY N/A 10/12/2019   Procedure: CYSTOSCOPY;  Surgeon: Bobbye Charleston, MD;   Location: [facility];  Service: Gynecology;  Laterality: N/A;   HIATAL HERNIA REPAIR N/A 02/15/2018   Procedure: HERNIA REPAIR HIATAL;  Surgeon: Excell Seltzer, MD;  Location: [facility];  Service: General;  Laterality: N/A;   HYSTEROSCOPY WITH D & C N/A 01/11/2015   Procedure: DILATATION AND CURETTAGE /HYSTEROSCOPY;  Surgeon: Jerelyn Charles, MD;  Location: [facility];  Service: Gynecology;  Laterality: N/A;   KNEE ARTHROSCOPY Right 2010   LAPAROSCOPIC GASTRIC SLEEVE RESECTION N/A 02/15/2018   Procedure: LAPAROSCOPIC GASTRIC SLEEVE RESECTION WITH UPPER ENDO AND ERAS PATHWAY;  Surgeon: Excell Seltzer, MD;  Location: [facility];  Service: General;  Laterality: N/A;   LAPAROSCOPIC OOPHORECTOMY Bilateral 2004   LEFT OOPHORECTOMY AND PARTIAL RIGHT OOPHORECTOMY   LIPOMA EXCISION Left 12/02/2016   Procedure: EXCISION LIPOMA OF LEFT SHOULDER;  Surgeon: Garald Balding, MD;  Location: [place];  Service: Orthopedics;  Laterality: Left;   ROBOTIC ASSISTED TOTAL HYSTERECTOMY WITH SALPINGECTOMY Bilateral 05/23/2015   Procedure: ROBOTIC ASSISTED TOTAL HYSTERECTOMY WITH SALPINGECTOMYwith right oophorectomy;  Surgeon: Bobbye Charleston, MD;  Location: [facility];  Service: Gynecology;  Laterality: Bilateral;   UMBILICAL HERNIA REPAIR  2002    Family History  Problem Relation Age of Onset   Lung cancer Mother 4       dec   Cancer Mother    Hypertension Mother    Arthritis Mother    Cancer Father 71       prostate   Hypertension Father    Leukemia Sister    Hypertension Sister    Arthritis Sister    Hyperlipidemia Sister    Hypertension Brother    Arthritis Brother    Multiple sclerosis Sister    Hypertension Sister    Hypertension Sister    Hypertension Brother    Hypertension Brother    Breast cancer Paternal Aunt    Alzheimer's disease Paternal Aunt    Diabetes Maternal Grandmother    Stroke Maternal Grandmother    Heart attack Paternal Grandmother    Asthma Other     Social  History   Socioeconomic History   Marital status: Divorced    Spouse name: Not on file   Number of children: Not on file   Years of education: 12   Highest education level: High school graduate  Occupational History   Occupation: Disability  Tobacco Use   Smoking status: Never   Smokeless tobacco: Never  Vaping Use   Vaping Use: Never used  Substance and Sexual Activity   Alcohol use: No    Alcohol/week: 0.0 standard drinks of alcohol   Drug use: Never   Sexual activity: Not on file    Comment: BTL w/ C/S in 1990  Other Topics Concern  Not on file  Social History Narrative   Divorced   Lives with son and grand-daughter   25- Son [name] (lives in [place])   [place] lives with patient   72- Daughter [name] (lives in [place])   Works at [employer]   Completed some college   Careers adviser   Social Determinants of Health   Financial Resource Strain: [place]  (08/05/2021)   Overall Financial Resource Strain (CARDIA)    Difficulty of Paying Living Expenses: Not hard at all  Food Insecurity: No Food Insecurity (08/05/2021)   Hunger Vital Sign    Worried About [place] in the Last Year: Never true    [place] in the Last Year: Never true  Transportation Needs: No Transportation Needs (08/05/2021)   PRAPARE - Hydrologist (Medical): No    Lack of Transportation (Non-Medical): No  Physical Activity: Inactive (08/05/2021)   Exercise Vital Sign    Days of Exercise per Week: 0 days    Minutes of Exercise per Session: 0 min  Stress: No Stress Concern Present (08/05/2021)   [place]    Feeling of Stress : Not at all  Social Connections: Socially Isolated (08/05/2021)   Social Connection and Isolation Panel [NHANES]    Frequency of Communication with Friends and Family: Once a week    Frequency of Social Gatherings with Friends and  Family: More than three times a week    Attends Religious Services: Never    Marine scientist or Organizations: No    Attends Archivist Meetings: Never    Marital Status: Divorced  Human resources officer Violence: Not At Risk (08/05/2021)   Humiliation, Afraid, Rape, and Kick questionnaire    Fear of Current or Ex-Partner: No    Emotionally Abused: No    Physically Abused: No    Sexually Abused: No    Outpatient Medications Prior to Visit  Medication Sig Dispense Refill   Artificial Tear Ointment (DRY EYES OP) Apply to eye as needed.     Ca Phosphate-Cholecalciferol (CALTRATE GUMMY BITES PO) Take by mouth daily.      diclofenac Sodium (VOLTAREN) 1 % GEL Apply 2 g topically daily as needed. 100 g 2   multivitamin-iron-minerals-folic acid (CENTRUM) chewable tablet Chew 1 tablet by mouth daily.     omeprazole (PRILOSEC) 40 MG capsule TAKE 1 CAPSULE (40 MG TOTAL) BY MOUTH DAILY. 30 capsule 5   albuterol (VENTOLIN HFA) 108 (90 Base) MCG/ACT inhaler INHALE 2 PUFFS BY MOUTH EVERY 6 HOURS AS NEEDED FOR WHEEZING FOR SHORTNESS OF BREATH 9 g 0   amLODipine (NORVASC) 10 MG tablet TAKE 1 TABLET BY MOUTH EVERY DAY 90 tablet 0   buPROPion (WELLBUTRIN XL) 300 MG 24 hr tablet TAKE 1 TABLET BY MOUTH ONCE DAILY . APPOINTMENT REQUIRED FOR FUTURE REFILLS 7 tablet 0   Cholecalciferol (VITAMIN D3) 50 MCG (2000 UT) CHEW Chew 8,000 Units by mouth daily.     famotidine (PEPCID) 20 MG tablet Take 1 tablet (20 mg total) by mouth at bedtime.     furosemide (LASIX) 20 MG tablet Take 1 tablet (20 mg total) by mouth daily as needed for edema. 30 tablet 3   gabapentin (NEURONTIN) 300 MG capsule Take 1 capsule (300 mg total) by mouth 3 (three) times daily. 90 capsule 0   lisinopril (ZESTRIL) 20 MG tablet TAKE 1 TABLET BY MOUTH  EVERY DAY 90 tablet 1   Menthol, Topical Analgesic, 4 % GEL Apply 1 application topically daily.     metoprolol succinate (TOPROL-XL) 50 MG 24 hr tablet TAKE 1 TABLET BY MOUTH EVERY DAY  WITH OR IMMEDIATELY FOLLOWING A MEAL 90 tablet 1   nitrofurantoin, macrocrystal-monohydrate, (MACROBID) 100 MG capsule Take 1 capsule (100 mg total) by mouth 2 (two) times daily. 10 capsule 0   nystatin (MYCOSTATIN/NYSTOP) powder Apply 1 application topically 2 (two) times daily. 60 g 2   tirzepatide (MOUNJARO) 7.5 MG/0.5ML Pen INJECT 7.5 MG SUBCUTANEOUSLY WEEKLY 2 mL 0   venlafaxine XR (EFFEXOR-XR) 150 MG 24 hr capsule TAKE 1 CAPSULE BY MOUTH ONCE DAILY WITH BREAKFAST 7 capsule 0   No facility-administered medications prior to visit.    Allergies  Allergen Reactions   Penicillins Itching and Other (See Comments)    Has patient had a PCN reaction causing immediate rash, facial/tongue/throat swelling, SOB or lightheadedness with hypotension: No Has patient had a PCN reaction causing severe rash involving mucus membranes or skin necrosis: No Has patient had a PCN reaction that required hospitalization No Has patient had a PCN reaction occurring within the last 10 years: No If all of the above answers are "NO", then may proceed with Cephalosporin use.     Review of Systems  Musculoskeletal:  Positive for joint pain (bilateral leg).       Objective:    Physical Exam Constitutional:      General: She is not in acute distress.    Appearance: Normal appearance.  HENT:     Head: Normocephalic and atraumatic.     Right Ear: External ear normal.     Left Ear: External ear normal.  Eyes:     Extraocular Movements: Extraocular movements intact.     Pupils: Pupils are equal, round, and reactive to light.  Cardiovascular:     Rate and Rhythm: Normal rate and regular rhythm.     Heart sounds: Normal heart sounds. No murmur heard.    No gallop.  Pulmonary:     Effort: Pulmonary effort is normal. No respiratory distress.     Breath sounds: Normal breath sounds. No wheezing or rales.  Skin:    General: Skin is warm.  Neurological:     Mental Status: She is alert and oriented to person,  place, and time.  Psychiatric:        Judgment: Judgment normal.     BP (!) 158/80 (BP Location: Left Arm, Patient Position: Sitting, Cuff Size: Large)   Pulse 61   Temp 98.5 F (36.9 C) (Oral)   Resp 16   Wt 272 lb (123.4 kg)   LMP 10/02/2007   SpO2 99%   BMI 46.69 kg/m  Wt Readings from Last 3 Encounters:  05/14/22 272 lb (123.4 kg)  08/05/21 (!) 324 lb (147 kg)  07/24/21 (!) 324 lb (147 kg)       Assessment & Plan:  Essential hypertension Assessment & Plan: Uncontrolled. Will increase lisinopril to '30mg'$  once daily. Continue amlodipine and metoprolol at current doses.   Orders: -     Comprehensive metabolic panel -     Furosemide; Take 1 tablet (20 mg total) by mouth daily as needed for edema.  Dispense: 30 tablet; Refill: 3  BMI 50.0-59.9, adult [facility]) Assessment & Plan: Wt Readings from Last 3 Encounters:  05/14/22 272 lb (123.4 kg)  08/05/21 (!) 324 lb (147 kg)  07/24/21 (!) 324 lb (147 kg)   Continue walking  as able.  She has improved her diet.  She is tolerating mounjaro 7.'5mg'$  weekly.  Will increase mounjaro dosing to '10mg'$ .    Moderate episode of recurrent major depressive disorder [facility]) Assessment & Plan: She is maintained on Wellbutrin '300mg'$  once daily. Overall stable. Continue same.    Lumbar radiculopathy Assessment & Plan: Pain is improved with gabapentin '300mg'$  tid.    B12 deficiency -     Vitamin B12  Gastroesophageal reflux disease, unspecified whether esophagitis present Assessment & Plan: Stable on omeprazole.   Hyperlipidemia, unspecified hyperlipidemia type Assessment & Plan: Lab Results  Component Value Date   CHOL 199 08/22/2020   HDL 83.00 08/22/2020   LDLCALC 93 08/22/2020   TRIG 115.0 08/22/2020   CHOLHDL 2 08/22/2020     Orders: -     Lipid panel  OSA (obstructive sleep apnea) Assessment & Plan: Stable on cpap, continue same.    Vitamin D deficiency -     VITAMIN D 25 Hydroxy (Vit-D Deficiency,  Fractures)  History of kidney stones Assessment & Plan: She had a hospitalization for kidney stones back in 2023 and was noted to have a non-obstructive ureteral stone on the right at discharge. She was advised at discharge to follow up with Urology but she did not.  Will obtain KUB to evaluate for residual stone (s).  Orders: -     DG Abd 2 Views; Future  Other orders -     Lisinopril; Take 1 tablet (30 mg total) by mouth daily.  Dispense: 90 tablet; Refill: 0 -     Tirzepatide; Inject 10 mg into the skin once a week.  Dispense: 2 mL; Refill: 2 -     Venlafaxine HCl ER; Take 1 capsule (150 mg total) by mouth daily with breakfast.  Dispense: 90 capsule; Refill: 1 -     Nystatin; Apply 1 Application topically 2 (two) times daily.  Dispense: 60 g; Refill: 2 -     Metoprolol Succinate ER; TAKE 1 TABLET BY MOUTH EVERY DAY WITH OR IMMEDIATELY FOLLOWING A MEAL  Dispense: 90 tablet; Refill: 1 -     Gabapentin; Take 1 capsule (300 mg total) by mouth 3 (three) times daily.  Dispense: 270 capsule; Refill: 1 -     buPROPion HCl ER (XL); TAKE 1 TABLET BY MOUTH ONCE DAILY . APPOINTMENT REQUIRED FOR FUTURE REFILLS  Dispense: 90 tablet; Refill: 1 -     amLODIPine Besylate; Take 1 tablet (10 mg total) by mouth daily.  Dispense: 90 tablet; Refill: 1 -     Albuterol Sulfate HFA; INHALE 2 PUFFS BY MOUTH EVERY 6 HOURS AS NEEDED FOR WHEEZING FOR SHORTNESS OF BREATH  Dispense: 9 g; Refill: 1    I, Nance Pear, NP, personally preformed the services described in this documentation.  All medical record entries made by the scribe were at my direction and in my presence.  I have reviewed the chart and discharge instructions (if applicable) and agree that the record reflects my personal performance and is accurate and complete. 05/14/2022  Nance Pear, NP

## 2022-05-15 ENCOUNTER — Encounter: Payer: Self-pay | Admitting: Family

## 2022-05-15 ENCOUNTER — Telehealth: Payer: Self-pay | Admitting: Gastroenterology

## 2022-05-15 NOTE — Telephone Encounter (Signed)
Received MyChart message.  Patient is requesting an appointment with Dr. Silverio Decamp to update issue with stretching of the esophagus and to reschedule a time for procedures.  As there are no available appointments with Dr. Silverio Decamp, please call patient and advise.  Thank you.

## 2022-05-15 NOTE — Telephone Encounter (Signed)
Called patient but no answer, left voice mail for patinet to call back.   

## 2022-05-16 NOTE — Telephone Encounter (Signed)
Patient notified of results, new medications and provider's comments. She verbalized understanding. She will keep future appointment in about one week and schedule lab appointment when she comes in.

## 2022-05-20 ENCOUNTER — Encounter: Payer: Self-pay | Admitting: Family

## 2022-05-26 ENCOUNTER — Ambulatory Visit: Payer: Self-pay | Admitting: Licensed Clinical Social Worker

## 2022-05-26 ENCOUNTER — Other Ambulatory Visit (INDEPENDENT_AMBULATORY_CARE_PROVIDER_SITE_OTHER): Payer: 59

## 2022-05-26 ENCOUNTER — Other Ambulatory Visit (INDEPENDENT_AMBULATORY_CARE_PROVIDER_SITE_OTHER): Payer: 59 | Admitting: Family

## 2022-05-26 ENCOUNTER — Ambulatory Visit: Payer: 59

## 2022-05-26 ENCOUNTER — Ambulatory Visit: Payer: 59 | Admitting: Family

## 2022-05-26 ENCOUNTER — Other Ambulatory Visit (HOSPITAL_BASED_OUTPATIENT_CLINIC_OR_DEPARTMENT_OTHER): Payer: Self-pay

## 2022-05-26 DIAGNOSIS — I10 Essential (primary) hypertension: Secondary | ICD-10-CM | POA: Diagnosis not present

## 2022-05-26 DIAGNOSIS — E559 Vitamin D deficiency, unspecified: Secondary | ICD-10-CM

## 2022-05-26 DIAGNOSIS — E538 Deficiency of other specified B group vitamins: Secondary | ICD-10-CM | POA: Diagnosis not present

## 2022-05-26 LAB — BASIC METABOLIC PANEL
BUN: 16 mg/dL (ref 6–23)
CO2: 26 mEq/L (ref 19–32)
Calcium: 8.5 mg/dL (ref 8.4–10.5)
Chloride: 103 mEq/L (ref 96–112)
Creatinine, Ser: 1.1 mg/dL (ref 0.40–1.20)
GFR: 55.48 mL/min — ABNORMAL LOW (ref >60.00)
Glucose, Bld: 92 mg/dL (ref 70–99)
Potassium: 3.9 mEq/L (ref 3.5–5.1)
Sodium: 137 mEq/L (ref 135–145)

## 2022-05-26 MED ORDER — TIRZEPATIDE 10 MG/0.5ML ~~LOC~~ SOAJ
10.0000 mg | SUBCUTANEOUS | 2 refills | Status: DC
  Filled 2022-05-26: qty 2, 28d supply, fill #0

## 2022-05-26 NOTE — Progress Notes (Signed)
Patient arrived after appointment was over and was evaluated for nurse visit instead.

## 2022-05-26 NOTE — Patient Instructions (Signed)
  It was a pleasure speaking with you today. Per your request a Care Coordination phone appointment is scheduled 05/29/22  Hailey Vance, [place] Work Care Coordination  [phone]

## 2022-05-26 NOTE — Progress Notes (Signed)
BP Readings from Last 3 Encounters:  05/14/22 (!) 158/80  07/10/21 (!) 132/93  06/26/21 (!) 151/83    Patient here today for BP check  BP today is 124/71 with a pulse of 68.  Patient advised to follow up in 3 months.

## 2022-05-26 NOTE — Patient Outreach (Signed)
  Care Coordination  Initial Visit Note   05/26/2022 Name: Hailey Vance MRN: [id number] DOB: 1965/08/15  Hailey Vance is a 57 y.o. year old female who sees Debbrah Alar, NP for primary care. I spoke with  Hailey Vance by phone today.  What matters to the patients health and wellness today?    [facility] brochure provided by CMA   Patient scheduled phone appointment to obtain additional information about the Care Coordination Program..   SDOH assessments and interventions completed:  No   Care Coordination Interventions:  No, not indicated   Follow up plan: Follow up call scheduled for 05/29/22    Encounter Outcome:  Pt. Visit Completed   Hailey Vance, [place] [phone]

## 2022-05-27 ENCOUNTER — Telehealth: Payer: Self-pay | Admitting: Family

## 2022-05-27 LAB — VITAMIN D 25 HYDROXY (VIT D DEFICIENCY, FRACTURES): VITD: 12.99 ng/mL — ABNORMAL LOW (ref 30.00–100.00)

## 2022-05-27 LAB — VITAMIN B12: Vitamin B-12: 219 pg/mL (ref 211–911)

## 2022-05-27 NOTE — Telephone Encounter (Signed)
Patient advised of result. She is not able to come once a week for b12 due to transportation issues.  She was scheduled for a B12 Im injection teach and administration 05/29/22. She will continue to do at home after that

## 2022-05-27 NOTE — Telephone Encounter (Signed)
B12 level is low.  Please begin vit b12 1067mcg IM weekly x 4 then monthly.   Kidney function is stable.

## 2022-05-29 ENCOUNTER — Ambulatory Visit: Payer: Self-pay | Admitting: Licensed Clinical Social Worker

## 2022-05-29 ENCOUNTER — Ambulatory Visit (INDEPENDENT_AMBULATORY_CARE_PROVIDER_SITE_OTHER): Payer: 59

## 2022-05-29 DIAGNOSIS — E538 Deficiency of other specified B group vitamins: Secondary | ICD-10-CM

## 2022-05-29 MED ORDER — CYANOCOBALAMIN 1000 MCG/ML IJ SOLN
1000.0000 ug | Freq: Once | INTRAMUSCULAR | Status: AC
  Administered 2022-06-04: 1000 ug via INTRAMUSCULAR

## 2022-05-29 NOTE — Patient Instructions (Signed)
Visit Information  Thank you for taking time to visit with me today. Please don't hesitate to contact me if I can be of assistance to you.   Following are the goals we discussed today:   Goals Addressed             This Visit's Progress    Manage physical and Mental Health       Activities and task to complete in order to accomplish goals.   Per your request we will spend more time during out next call discussing your mental health needs I have scheduled a phone appointment with the [place] she will provide educational informational and assist you with managing your health needs related to healthy habits and blood pressure I have placed a referral with [facility] to contact you with resources for housing You may also want to  [place] [email or web address]    to search for options online         Our next appointment is by telephone on 06/04/22 at 2:30  Please call the care guide team at [phone] if you need to cancel or reschedule your appointment.   If you are experiencing a Mental Health or [place] or need someone to talk to, please call the Suicide and Crisis Lifeline: 988 call the [place] National Suicide Prevention Lifeline: 269-332-4704 or TTY: 216-483-7135 TTY 810-350-9130) to talk to a trained counselor call 1-800-273-TALK (toll free, 24 hour hotline) go to [facility] [address] [place] [phone])   Patient verbalizes understanding of instructions and care plan provided today and agrees to view in [place]. Active MyChart status and patient understanding of how to access instructions and care plan via MyChart confirmed with patient.     Casimer Lanius, [place] [phone]

## 2022-05-29 NOTE — Progress Notes (Signed)
Pt here for teaching B12 injection per PCP Patient instructed on how to self administer IM injection on thigh. She verbalized understanding instructions and was able to inject herself today.   B12 1090mcg given IM (by patient) and pt tolerated injection well.  She will continue to do this at home as instructed once a week for another 3 weeks and then once a month.

## 2022-05-29 NOTE — Patient Outreach (Signed)
  Care Coordination  Initial Visit Note   05/29/2022 Name: Hailey Vance MRN: [id number] DOB: 1965-10-04  Hailey Vance is a 57 y.o. year old female who sees Debbrah Alar, NP for primary care. I spoke with  Hailey Vance by phone today.  What matters to the patients health and wellness today?  Managing her health   Patient is experiencing symptoms of stress.  She has agrees to work with LCSW to assist with reducing symptoms connect with community support and Therapist, sports for health education.   Goals Addressed             This Visit's Progress    Manage physical and Mental Health       Activities and task to complete in order to accomplish goals.   Per your request we will spend more time during out next call discussing your mental health needs I have scheduled a phone appointment with the [place] she will provide educational informational and assist you with managing your health needs related to healthy habits and blood pressure I have placed a referral with [facility] to contact you with resources for housing You may also want to  [place] [email or web address]    to search for options online        SDOH assessments and interventions completed:  Yes  SDOH Interventions Today    Flowsheet Row Most Recent Value  SDOH Interventions   Food Insecurity Interventions Intervention Not Indicated  Housing Interventions [facility] Referral  [needs to find housing without stairs]  Transportation Interventions Intervention Not Indicated, Payor Benefit  [discussed]  Stress Interventions Provide Counseling       Care Coordination Interventions:  Yes, provided  Interventions Today    Flowsheet Row Most Recent Value  Chronic Disease   Chronic disease during today's visit Hypertension (HTN)  General Interventions   General Interventions Discussed/Reviewed General Interventions Discussed, Community Resources, Referral to Nurse  [Reviewed care coordination  program]  Mental Health Interventions   Mental Health Discussed/Reviewed Mental Health Discussed       Follow up plan: Follow up call scheduled for 06/04/22    Encounter Outcome:  Pt. Visit Completed   Casimer Lanius, [place] [phone]

## 2022-06-02 ENCOUNTER — Encounter: Payer: Self-pay | Admitting: Family

## 2022-06-02 MED ORDER — "BD DISP NEEDLE 25G X 1"" MISC": 1 refills | Status: DC

## 2022-06-02 MED ORDER — "SYRINGE/NEEDLE (DISP) 23G X 1"" 3 ML MISC": 1 refills | Status: DC

## 2022-06-02 MED ORDER — CYANOCOBALAMIN 1000 MCG/ML IJ SOLN: 1000.0000 ug | INTRAMUSCULAR | 0 refills | Status: DC

## 2022-06-04 ENCOUNTER — Ambulatory Visit: Payer: Self-pay | Admitting: Licensed Clinical Social Worker

## 2022-06-04 DIAGNOSIS — F331 Major depressive disorder, recurrent, moderate: Secondary | ICD-10-CM

## 2022-06-04 DIAGNOSIS — F411 Generalized anxiety disorder: Secondary | ICD-10-CM

## 2022-06-04 DIAGNOSIS — E538 Deficiency of other specified B group vitamins: Secondary | ICD-10-CM | POA: Diagnosis not present

## 2022-06-04 NOTE — Patient Instructions (Signed)
Visit Information  Thank you for taking time to visit with me today. Please don't hesitate to contact me if I can be of assistance to you.   Following are the goals we discussed today:   Goals Addressed             This Visit's Progress    Manage physical and Mental Health       Activities and task to complete in order to accomplish goals.   Per your request I have placed a referral with [place] [phone], they will call you to schedule an appointment I have scheduled a phone appointment with the [place] she will provide educational informational and assist you with managing your health needs related to healthy habits and blood pressure I have placed a referral with [facility] [facility] to contact you with resources for housing You may also want to  [place] [email or web address]    to search for options online         Our next appointment is by telephone on 06/18/22 at 8:30  Please call the care guide team at [phone] if you need to cancel or reschedule your appointment.   If you are experiencing a Mental Health or [place] or need someone to talk to, please  call the Suicide and Crisis Lifeline: 988 call the [place] National Suicide Prevention Lifeline: 719-205-9908 or TTY: 256-715-4541 TTY [phone]) to talk to a trained counselor call 1-800-273-TALK (toll free, 24 hour hotline) go to [facility] [address] [place] [phone])   Patient verbalizes understanding of instructions and care plan provided today and agrees to view in [place]. Active MyChart status and patient understanding of how to access instructions and care plan via MyChart confirmed with patient.     Casimer Lanius, [place] [phone]

## 2022-06-04 NOTE — Patient Outreach (Signed)
  Care Coordination  Follow Up Visit Note   06/04/2022 Name: Hailey Vance MRN: [id number] DOB: March 11, 1965  Hailey Vance is a 57 y.o. year old female who sees Hailey Alar, NP for primary care. I spoke with  Rondell Reams by phone today.  What matters to the patients health and wellness today?  Managing her mental and physical health needs    Goals Addressed             This Visit's Progress    Manage physical and Mental Health       Activities and task to complete in order to accomplish goals.   Per your request I have placed a referral with [facility] [phone], they will call you to schedule an appointment I have scheduled a phone appointment with the [facility] she will provide educational informational and assist you with managing your health needs related to healthy habits and blood pressure I have placed a referral with [facility] to contact you with resources for housing You may also want to  [place] [email or web address]    to search for options online        SDOH assessments and interventions completed:  Yes  SDOH Interventions Today    Flowsheet Row Most Recent Value  SDOH Interventions   Depression Interventions/Treatment  Currently on Treatment  [will discuss adding therapy]          06/04/2022    2:35 PM 08/05/2021    8:37 AM 11/28/2020   12:17 PM  Depression screen PHQ 2/9  Decreased Interest 2 0 1  Down, Depressed, Hopeless 3 1 1   PHQ - 2 Score 5 1 2   Altered sleeping 3  2  Tired, decreased energy 1  1  Change in appetite 1  1  Feeling bad or failure about yourself  3  1  Trouble concentrating 0  0  Moving slowly or fidgety/restless 2  1  Suicidal thoughts 1  0  PHQ-9 Score 16  8  Difficult doing work/chores Very difficult      Care Coordination Interventions:  Yes, provided  Interventions Today    Flowsheet Row Most Recent Value  Chronic Disease   Chronic disease during today's visit  Hypertension (HTN)  Education Interventions   Education Provided Provided Education  Provided Verbal Education On Mental Health/Coping with Illness  Mental Health Interventions   Mental Health Discussed/Reviewed Mental Health Reviewed, Anxiety, Depression, Crisis  [assessment, solution focused]  Pharmacy Interventions   Pharmacy Dicussed/Reviewed Pharmacy Topics Reviewed, Medication Adherence  Medication Adherence --  [reports no missed does or psychotropic medications]       Follow up plan:  Referral made to [facility] Follow up call scheduled for 06/18/22    Encounter Outcome:  Pt. Visit Completed   Casimer Lanius, [place] [phone]

## 2022-06-11 ENCOUNTER — Ambulatory Visit: Payer: Self-pay

## 2022-06-11 NOTE — Patient Instructions (Addendum)
Visit Information  Thank you for taking time to visit with me today. Please don't hesitate to contact me if I can be of assistance to you.   Following are the goals we discussed today:   Goals Addressed             This Visit's Progress    weight loss education       Interventions Today    Flowsheet Row Most Recent Value  Chronic Disease   Chronic disease during today's visit Hypertension (HTN), Other  [weight loss]  General Interventions   General Interventions Discussed/Reviewed General Interventions Discussed, Doctor Visits, Community Resources  Doctor Visits Discussed/Reviewed Doctor Visits Discussed  Exercise Interventions   Exercise Discussed/Reviewed Exercise Discussed  [discussed water therapy at [facility] and [employer] center as well as Building surveyor. encouraged to participate in excercised recommended by orthopedic provider.]  Education Interventions   Education Provided Provided Education, Provided Web-based Education  [emmi video: Nutrition full program]  Provided Verbal Education On Exercise, Nutrition  [advised to discuss with PCP re: referral to weight loss center]  Mental Health Interventions   Mental Health Discussed/Reviewed Other  [encouraged to continue follow up with lcsw]  Nutrition Interventions   Nutrition Discussed/Reviewed Nutrition Discussed  Pharmacy Interventions   Pharmacy Dicussed/Reviewed Pharmacy Topics Discussed            Our next appointment is by telephone on 07/11/22 at 10 am  Please call the care guide team at [phone] if you need to cancel or reschedule your appointment.   If you are experiencing a Mental Health or Behavioral Health Crisis or need someone to talk to, please call the Suicide and Crisis Lifeline: 50  Kathyrn Sheriff, RN, MSN, BSN, CCM [facility] Care Coordinator [phone]

## 2022-06-11 NOTE — Patient Outreach (Addendum)
  Care Coordination   Initial Visit Note   06/11/2022 Name: Hailey Vance MRN: [id number] DOB: 06-24-1965  Hailey Vance is a 57 y.o. year old female who sees Sandford Craze, NP for primary care. I spoke with  Owens Loffler by phone today.  What matters to the patients health and wellness today?  Hailey Vance reports she has information on HTN, but primarily interested in weight loss. She states she needs to have knee surgery and needs to loose weight.   Goals Addressed             This Visit's Progress    weight loss education       Interventions Today    Flowsheet Row Most Recent Value  Chronic Disease   Chronic disease during today's visit Hypertension (HTN), Other  [weight loss]  General Interventions   General Interventions Discussed/Reviewed General Interventions Discussed, Doctor Visits, Community Resources  Doctor Visits Discussed/Reviewed Doctor Visits Discussed  Exercise Interventions   Exercise Discussed/Reviewed Exercise Discussed  [discussed water therapy at [facility] and [employer] center as well as Building surveyor. encouraged to participate in excercised recommended by orthopedic provider.]  Education Interventions   Education Provided Provided Education, Provided Web-based Education  [emmi video: Nutrition full program]  Provided Verbal Education On Exercise, Nutrition  [advised to discuss with PCP re: referral to weight loss center]  Mental Health Interventions   Mental Health Discussed/Reviewed Other  [encouraged to continue follow up with lcsw]  Nutrition Interventions   Nutrition Discussed/Reviewed Nutrition Discussed  Pharmacy Interventions   Pharmacy Dicussed/Reviewed Pharmacy Topics Discussed            SDOH assessments and interventions completed:  Yes  SDOH Interventions Today    Flowsheet Row Most Recent Value  SDOH Interventions   Utilities Interventions Intervention Not Indicated     Care Coordination Interventions:  Yes,  provided   Follow up plan: Follow up call scheduled for 07/11/22    Encounter Outcome:  Pt. Visit Completed   Kathyrn Sheriff, RN, MSN, BSN, [facility] Care Coordinator [phone]   06/11/22 Addendum: emmi video re: stress/ Dash diet and Nutrition sent

## 2022-06-14 ENCOUNTER — Other Ambulatory Visit: Payer: Self-pay | Admitting: Family

## 2022-06-17 IMAGING — MR MR LUMBAR SPINE W/O CM
4 of 5 series · 26 of 48 positions shown · non-contrast
Comparison: Prior lumbar spine radiographs 08/22/2020 and earlier.
Lumbar spine MRI 06/29/2013.

CLINICAL DATA: Lumbar radiculopathy. Bilateral leg weakness. Lumbar
radiculopathy, symptoms persist with greater than 6 weeks treatment.
Lumbar radiculopathy and bilateral lower extremity weakness.
Additional history provided by scanning technologist: Patient
reports lower extremity weakness and ongoing radicular symptoms.

EXAM:
MRI LUMBAR SPINE WITHOUT CONTRAST
TECHNIQUE: Multiplanar, multisequence MR imaging of the lumbar spine was
performed. No intravenous contrast was administered.

[Series 5: T2 · sagittal · 4.0mm · 0.73mm/px · 6 of 16 slices shown (1 of 2)]
[im 1/16]
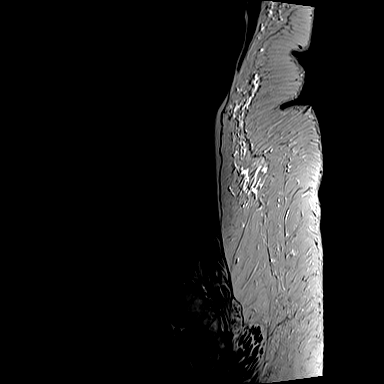
[im 4/16]
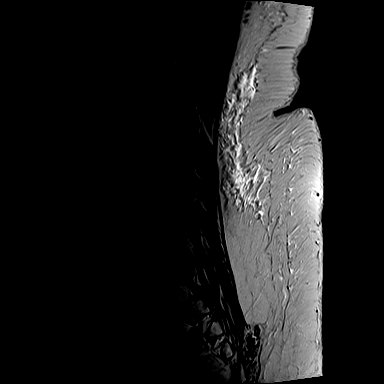
[im 7/16]
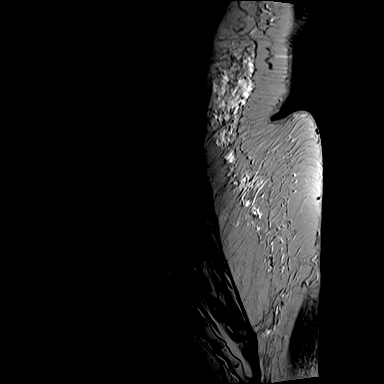
[im 10/16]
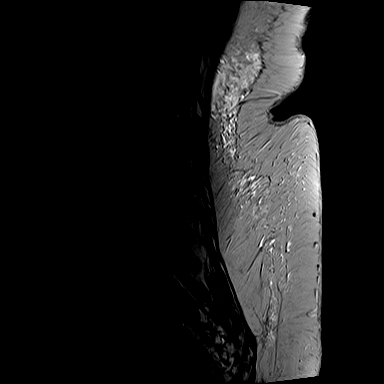
[im 13/16]
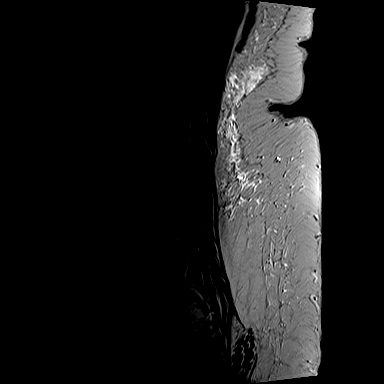
[im 16/16]
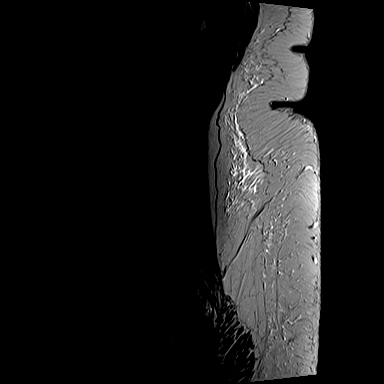

[Series 7: T1 · sagittal · 4.0mm · 0.88mm/px · 7 of 16 slices shown (1 of 2)]
[im 1/16]
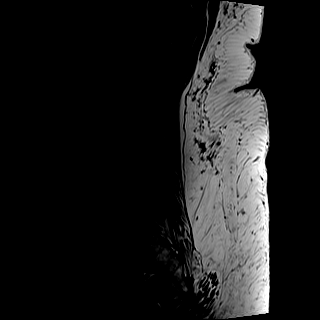
[im 3/16]
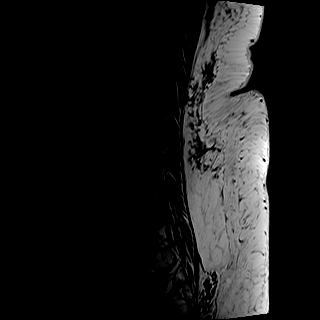
[im 6/16]
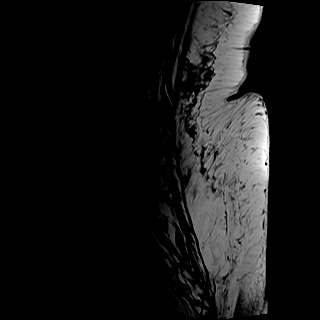
[im 8/16]
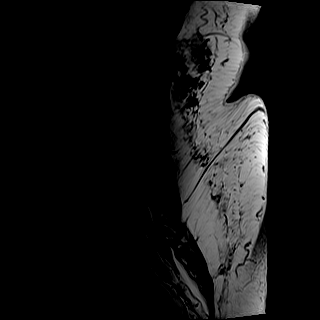
[im 11/16]
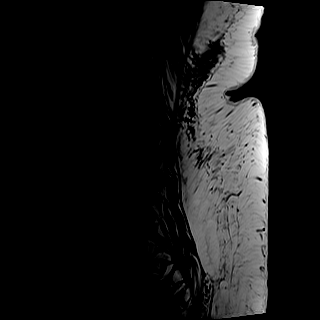
[im 13/16]
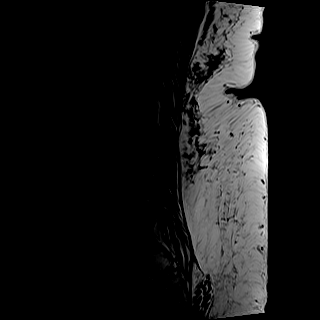
[im 16/16]
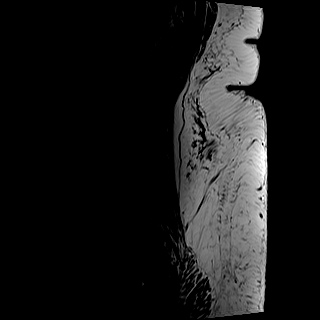

[Series 8: T2 · axial · 4.0mm · 0.57mm/px · z∈[-165,+33]mm · 8 of 33 slices shown (2 of 2)]
[im 1/33]
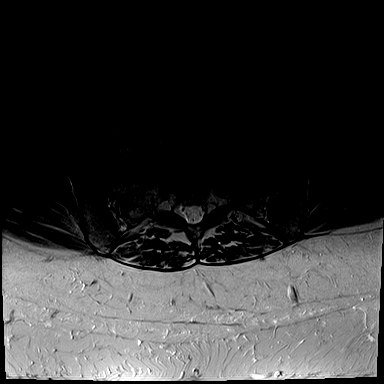
[im 5/33]
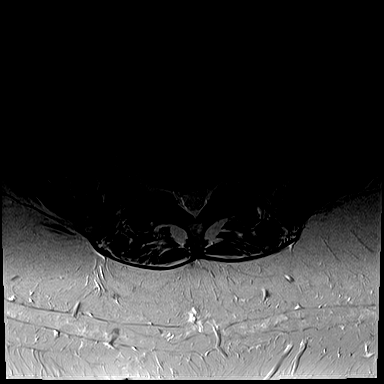
[im 10/33]
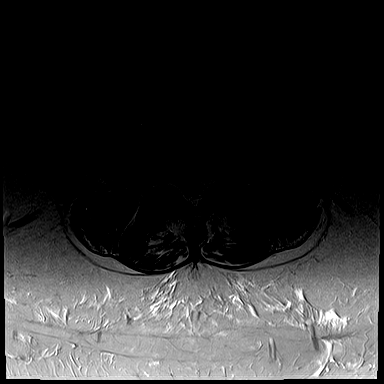
[im 15/33]
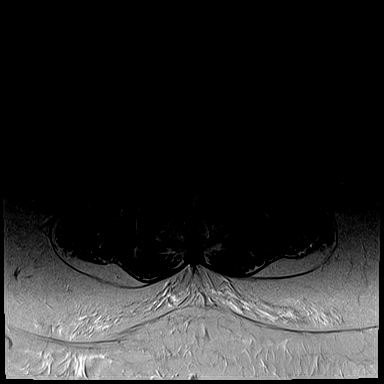
[im 18/33]
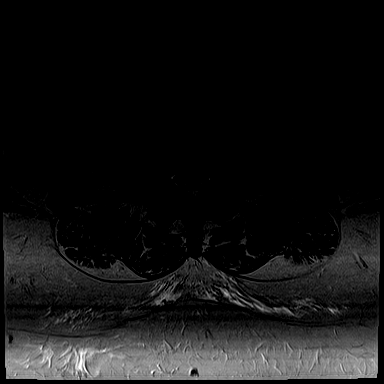
[im 23/33]
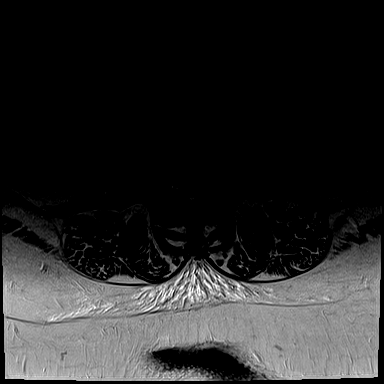
[im 28/33]
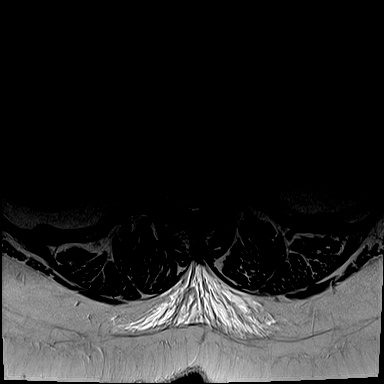
[im 33/33]
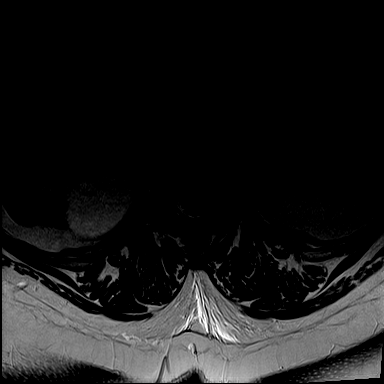

[Series 9: T1 · axial · 4.0mm · 0.34mm/px · z∈[-165,+8]mm · 5 of 33 slices shown (2 of 2)]
[im 1/33]
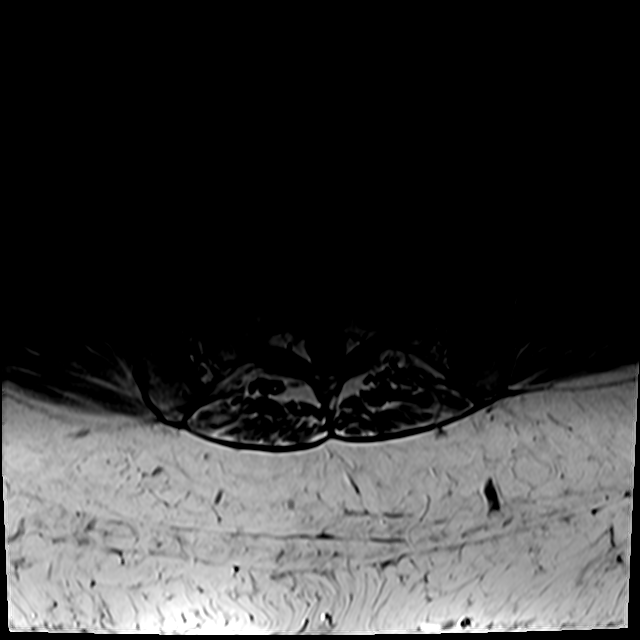
[im 5/33]
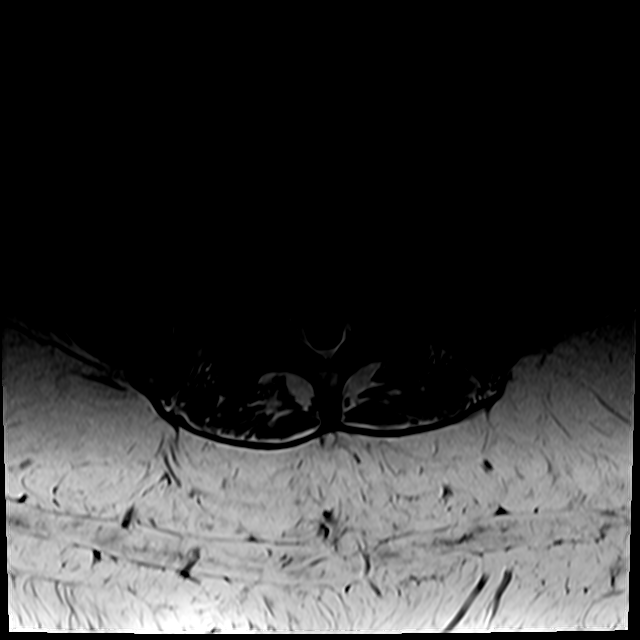
[im 10/33]
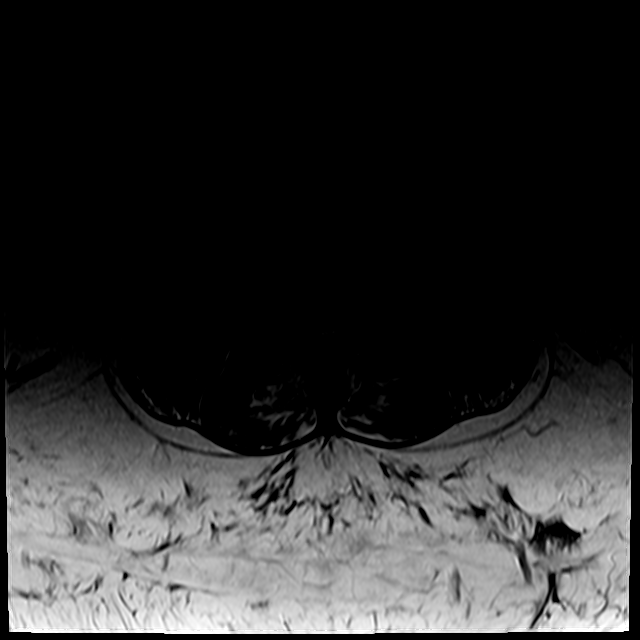
[im 18/33]
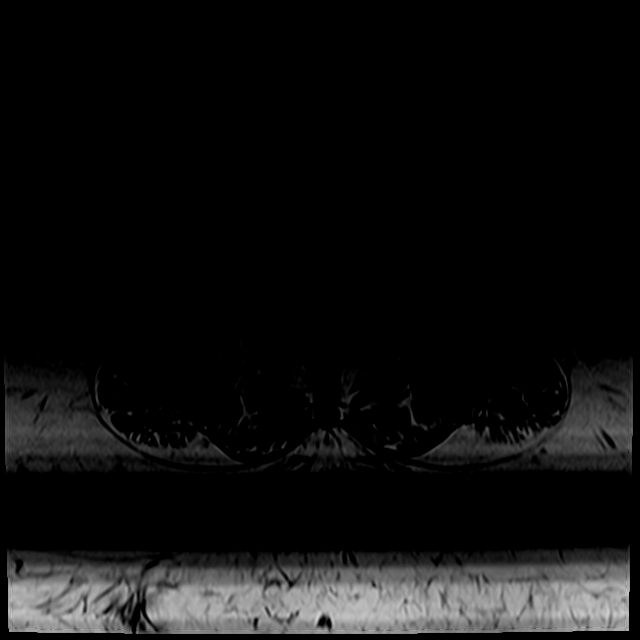
[im 28/33]
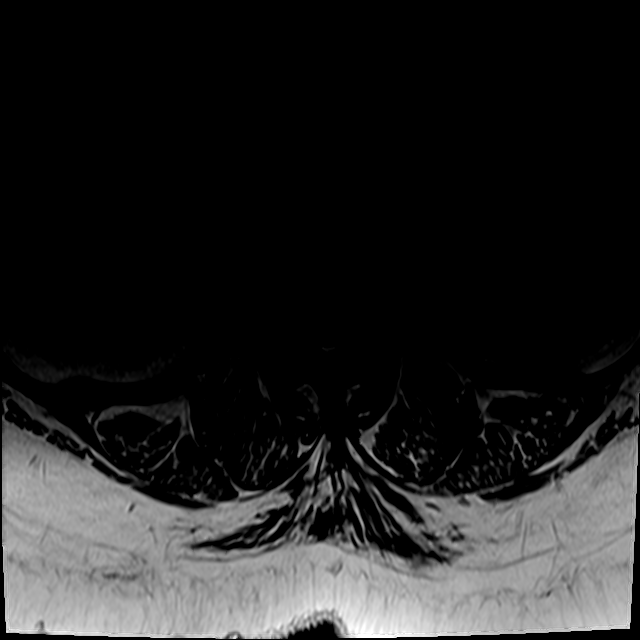

[26 of 48 positions shown; findings below may reference images not displayed]

FINDINGS: Segmentation: 5 lumbar vertebrae. The caudal most well-formed
intervertebral disc space is designated L5-S1.

Alignment: Trace L4-L5 grade 1 anterolisthesis, new from the prior
MRI.

Vertebrae: Vertebral body height is maintained. No significant
marrow edema or focal suspicious osseous lesion.

Conus medullaris and cauda equina: Conus extends to the L2 level. No
signal abnormality within the visualized distal spinal cord.

Paraspinal and other soft tissues: Multiple large bilateral renal
cysts, incompletely imaged and incompletely assessed. Paraspinal
soft tissues unremarkable.

Disc levels:

Unless otherwise stated, the level by level findings below have not
significantly changed from the prior MRI of 06/29/2013.

Mild disc degeneration at L3-L4, L4-L5 and L5-S1, slightly
progressed.

T11-T12: This level is imaged in the sagittal plane only. Slight
disc bulge. Mild facet arthrosis and ligamentum flavum hypertrophy.
No significant spinal canal or foraminal stenosis.

T12-L1: Slight disc bulge. Mild facet arthrosis. No significant
spinal canal or foraminal stenosis.

L1-L2: Minimal facet and ligamentum flavum hypertrophy. No
significant disc herniation or stenosis.

L2-L3: Mild facet arthrosis and ligamentum flavum hypertrophy. No
significant disc herniation or stenosis.

L3-L4: Small disc bulge. Progressive moderate facet arthrosis with
ligamentum flavum hypertrophy. Progressive mild epidural
lipomatosis. Progressive mild narrowing of the spinal canal. No
significant foraminal stenosis.

L4-L5: Trace grade 1 anterolisthesis, new from the prior exam.
Slight disc uncovering. Moderate bilateral facet arthrosis with
ligamentum flavum hypertrophy. Mild epidural lipomatosis. No
significant spinal canal stenosis. Minimal relative narrowing of the
left neural foramen.

L5-S1: Mild-to-moderate facet arthrosis with ligamentum flavum
hypertrophy. No significant disc herniation or stenosis.
IMPRESSION: Lumbar and lower thoracic spondylosis, and mild epidural
lipomatosis, as described and slightly progressed at multiple levels
since the prior MRI of 06/29/2013. No more than mild spinal canal or
neural foraminal narrowing. Mild multilevel disc degeneration,
greatest at L3-L4, L3-L4 and L4-L5.

Trace L4-L5 grade 1 anterolisthesis, new from the prior MRI.

Multiple incompletely imaged large bilateral renal cysts.

## 2022-06-18 ENCOUNTER — Telehealth: Payer: Self-pay | Admitting: Licensed Clinical Social Worker

## 2022-06-18 ENCOUNTER — Ambulatory Visit: Payer: Self-pay | Admitting: Licensed Clinical Social Worker

## 2022-06-18 NOTE — Patient Outreach (Signed)
  Care Coordination  Follow Up Visit Note   06/18/2022 Name: Hailey Vance MRN: [id number] DOB: [birth year]  Hailey Vance is a 57 y.o. year old female who sees Sandford Craze, NP for primary care. I spoke with  Hailey Vance by phone today.  What matters to the patients health and wellness today?  Managing stress and finding new housing.    Goals Addressed             This Visit's Progress    Manage physical and Mental Health       Activities and task to complete in order to accomplish goals.   I have placed a referral with [facility] [phone], they have tried but have not been able to reach you.   Please call to schedule an appointment I have placed a referral with [facility] to contact you with resources for housing. tried to call  but voicemail not set up, they sent you an e-mail March 28th with information on handicap accessible housing. Please check your e-mail to locate the information they e-mailed to you call if you need the to resend the e-mail  [phone]            SDOH assessments and interventions completed:  No   Care Coordination Interventions:  Yes, provided  Interventions Today    Flowsheet Row Most Recent Value  Chronic Disease   Chronic disease during today's visit Hypertension (HTN)  Mental Health Interventions   Mental Health Discussed/Reviewed Mental Health Reviewed, Coping Strategies  [( family stress)  solution focused, active listening, task center]       Follow up plan: Follow up call scheduled for 3 weeks    Encounter Outcome:  Pt. Visit Completed   Sammuel Hines, LCSW Social Work Care Coordination  [facility] [employer] [phone]

## 2022-06-18 NOTE — Patient Instructions (Signed)
    I am sorry you were unable to keep your phone appointment today.   Please call me to reschedule Listed below is information I was going to discuss with you  Sammuel Hines, LCSW Social Work Care Coordination  [phone]    Goals Addressed             This Visit's Progress    Manage physical and Mental Health       Activities and task to complete in order to accomplish goals.   I have placed a referral with [facility] [phone], they have tried but have not been able to reach you.   Please call to schedule an appointment I have placed a referral with [facility] to contact you with resources for housing. tried to call  but voicemail not set up, they sent you an e-mail March 28th with information on handicap accessible housing              Please call the care guide team at [phone] if you need to cancel or reschedule your appointment.   If you are experiencing a Mental Health or Behavioral Health Crisis or need someone to talk to, please call the Suicide and Crisis Lifeline: 988 call the [place] National Suicide Prevention Lifeline: 423-605-8839 or TTY: 4756362690 TTY 6610388621) to talk to a trained counselor call 1-800-273-TALK (toll free, 24 hour hotline) go to [facility] [address], [place] [phone])

## 2022-06-18 NOTE — Patient Outreach (Signed)
  Care Coordination   06/18/2022 Name: Hailey Vance MRN: [id number] DOB: 1965-10-14   Care Coordination Outreach Attempts:  An unsuccessful telephone outreach was attempted for a scheduled appointment today.  Follow Up Plan:  Additional outreach attempts will be made to offer the patient care coordination information and services.   Encounter Outcome:  No Answer   Care Coordination Interventions:  Yes, provided   Interventions Today    Flowsheet Row Most Recent Value  General Interventions   General Interventions Discussed/Reviewed Community Resources  [place] on referral placed to [employer]  they are unable to reach patient]  Mental Health Interventions   Mental Health Discussed/Reviewed Mental Health Reviewed  Liliane Bade on referral placed for therapy/ they are unable to reach patient]       Sammuel Hines, LCSW Social Work Care Coordination  [facility] [employer] [phone]

## 2022-06-18 NOTE — Patient Instructions (Signed)
Visit Information  Thank you for taking time to visit with me today. Please don't hesitate to contact me if I can be of assistance to you.   Following are the goals we discussed today:   Goals Addressed             This Visit's Progress    Manage physical and Mental Health       Activities and task to complete in order to accomplish goals.   I have placed a referral with [facility] [phone], they have tried but have not been able to reach you.   Please call to schedule an appointment I have placed a referral with [facility] to contact you with resources for housing. tried to call  but voicemail not set up, they sent you an e-mail March 28th with information on handicap accessible housing. Please check your e-mail to locate the information they e-mailed to you call if you need the to resend the e-mail  [phone]             Our next appointment is by telephone on 07/10/22 at 8:30  Please call the care guide team at [phone] if you need to cancel or reschedule your appointment.   If you are experiencing a Mental Health or Behavioral Health Crisis or need someone to talk to, please call the Suicide and Crisis Lifeline: 988 call the [place] National Suicide Prevention Lifeline: 619 129 1898 or TTY: 351-124-8224 TTY 712-618-5122) to talk to a trained counselor call 1-800-273-TALK (toll free, 24 hour hotline)   Patient verbalizes understanding of instructions and care plan provided today and agrees to view in MyChart. Active MyChart status and patient understanding of how to access instructions and care plan via MyChart confirmed with patient.     Sammuel Hines, LCSW Social Work Care Coordination  [facility] [employer] [phone]

## 2022-06-28 DIAGNOSIS — G4733 Obstructive sleep apnea (adult) (pediatric): Secondary | ICD-10-CM | POA: Diagnosis not present

## 2022-07-03 ENCOUNTER — Encounter: Payer: Self-pay | Admitting: Family

## 2022-07-04 MED ORDER — TIRZEPATIDE 10 MG/0.5ML ~~LOC~~ SOAJ: 10.0000 mg | SUBCUTANEOUS | 2 refills | Status: DC

## 2022-07-04 MED ORDER — "BD DISP NEEDLE 25G X 1"" MISC": 1 refills | Status: DC

## 2022-07-04 MED ORDER — METOPROLOL SUCCINATE ER 50 MG PO TB24: ORAL_TABLET | ORAL | 1 refills | Status: DC

## 2022-07-04 MED ORDER — "SYRINGE/NEEDLE (DISP) 23G X 1"" 3 ML MISC": 1 refills | Status: DC

## 2022-07-06 ENCOUNTER — Other Ambulatory Visit: Payer: Self-pay | Admitting: Family

## 2022-07-10 ENCOUNTER — Ambulatory Visit: Payer: Self-pay | Admitting: Licensed Clinical Social Worker

## 2022-07-10 NOTE — Patient Outreach (Signed)
  Care Coordination  Follow Up Visit Note   07/10/2022 Name: Hailey Vance MRN: [id number] DOB: [birth year]  Hailey Vance is a 57 y.o. year old female who sees Sandford Craze, NP for primary care. I spoke with  Owens Loffler by phone today.  What matters to the patients health and wellness today?  Managing her health   Patient is making progress.  She needs to complete required paperwork prior to getting therapy appointment scheduled  Goals Addressed             This Visit's Progress    Manage physical and Mental Health       Activities and task to complete in order to accomplish goals.   I am glad you were able to connect with [facility] [phone], Please complete the required forms to get your appointment scheduled I will follow up with [facility] referral. I am sorry you contacted them and have not gotten the needed information  ( e-mail sent out today)        SDOH assessments and interventions completed:  No   Care Coordination Interventions:  Yes, provided  Interventions Today    Flowsheet Row Most Recent Value  Chronic Disease   Chronic disease during today's visit Hypertension (HTN)  [reminded patient of appointment with RN care manager]  General Interventions   General Interventions Discussed/Reviewed General Interventions Reviewed, [employer], Communication with  Communication with --  [employer] [facility] ref housing resources]  Mental Health Interventions   Mental Health Discussed/Reviewed Mental Health Reviewed  [name] for barriers to therapy,]       Follow up plan: Follow up call scheduled for 3 weeks    Encounter Outcome:  Pt. Visit Completed   Sammuel Hines, LCSW Social Work Care Coordination  [facility] [employer] [phone]

## 2022-07-10 NOTE — Patient Instructions (Signed)
Visit Information  Thank you for taking time to visit with me today. Please don't hesitate to contact me if I can be of assistance to you.   Following are the goals we discussed today:   Goals Addressed             This Visit's Progress    Manage physical and Mental Health       Activities and task to complete in order to accomplish goals.   I am glad you were able to connect with [facility] [phone], Please complete the required forms to get your appointment scheduled I will follow up with [facility] 360 referral. I am sorry you contacted them and have not gotten the needed information  ( e-mail sent out today)         Our next appointment is by telephone on 07/31/22 at 9:00  Please call the care guide team at [phone] if you need to cancel or reschedule your appointment.   If you are experiencing a Mental Health or Behavioral Health Crisis or need someone to talk to, please call the Suicide and Crisis Lifeline: 988 call the [place] National Suicide Prevention Lifeline: 586-788-2063 or TTY: 702-081-3939 TTY 930 583 6488) to talk to a trained counselor call 1-800-273-TALK (toll free, 24 hour hotline) go to [facility] [address], [place] [phone])   Patient verbalizes understanding of instructions and care plan provided today and agrees to view in MyChart. Active MyChart status and patient understanding of how to access instructions and care plan via MyChart confirmed with patient.     Sammuel Hines, LCSW Social Work Care Coordination  [facility] [employer] [phone]

## 2022-07-11 ENCOUNTER — Telehealth: Payer: Self-pay

## 2022-07-11 NOTE — Patient Outreach (Signed)
  Care Coordination   07/11/2022 Name: Hailey Vance MRN: [id number] DOB: Feb 19, 1966   Care Coordination Outreach Attempts:  An unsuccessful telephone outreach was attempted for a scheduled appointment today.  Follow Up Plan:  Additional outreach attempts will be made to offer the patient care coordination information and services.   Encounter Outcome:  No Answer   Care Coordination Interventions:  No, not indicated    Kathyrn Sheriff, RN, MSN, BSN, CCM [facility] Care Coordinator [phone]

## 2022-07-15 ENCOUNTER — Telehealth: Payer: Self-pay | Admitting: *Deleted

## 2022-07-15 NOTE — Progress Notes (Signed)
  Care Coordination Note  07/15/2022 Name: Hailey Vance MRN: [id number] DOB: 22-Aug-1965  Hailey Vance is a 57 y.o. year old female who is a primary care patient of Sandford Craze, NP and is actively engaged with the care management team. I reached out to Owens Loffler by phone today to assist with re-scheduling a follow up visit with the RN Case Manager  Follow up plan: Unsuccessful telephone outreach attempt made. A HIPAA compliant phone message was left for the patient providing contact information and requesting a return call.   Burman Nieves, CCMA Care Coordination Care Guide Direct Dial: [phone]

## 2022-07-22 ENCOUNTER — Encounter: Payer: Self-pay | Admitting: Family

## 2022-07-24 ENCOUNTER — Other Ambulatory Visit: Payer: Self-pay

## 2022-07-24 MED ORDER — "SYRINGE/NEEDLE (DISP) 23G X 1"" 3 ML MISC": 1 refills | Status: DC

## 2022-07-24 MED ORDER — METOPROLOL SUCCINATE ER 50 MG PO TB24: ORAL_TABLET | ORAL | 1 refills | Status: DC

## 2022-07-24 MED ORDER — TIRZEPATIDE 10 MG/0.5ML ~~LOC~~ SOAJ: 10.0000 mg | SUBCUTANEOUS | 2 refills | Status: DC

## 2022-07-24 MED ORDER — CYANOCOBALAMIN 1000 MCG/ML IJ SOLN: 1000.0000 ug | INTRAMUSCULAR | 0 refills | Status: DC

## 2022-07-24 MED ORDER — "BD DISP NEEDLE 25G X 1"" MISC": 1 refills | Status: DC

## 2022-07-28 ENCOUNTER — Other Ambulatory Visit: Payer: Self-pay | Admitting: Family

## 2022-07-28 DIAGNOSIS — G4733 Obstructive sleep apnea (adult) (pediatric): Secondary | ICD-10-CM | POA: Diagnosis not present

## 2022-07-29 ENCOUNTER — Telehealth: Payer: Self-pay | Admitting: Family

## 2022-07-29 NOTE — Telephone Encounter (Signed)
Copied from CRM [id number]. Topic: Medicare AWV >> Jul 29, 2022 11:11 AM Payton Doughty wrote: Reason for CRM: Called patient to schedule Medicare Annual Wellness Visit (AWV). No voicemail available to leave a message.  Last date of AWV: 08/05/21  Please schedule an appointment at any time with Donne Anon, CMA  .  If any questions, please contact me.  Thank you ,  Verlee Rossetti; Care Guide Ambulatory Clinical Support [facility] [facility] Direct Dial: [phone]

## 2022-07-31 ENCOUNTER — Encounter: Payer: Self-pay | Admitting: Licensed Clinical Social Worker

## 2022-07-31 ENCOUNTER — Telehealth: Payer: Self-pay | Admitting: Licensed Clinical Social Worker

## 2022-07-31 NOTE — Patient Outreach (Signed)
  Care Coordination   07/31/2022 Name: Alura Santone MRN: [id number] DOB: 1965/05/29   Care Coordination Outreach Attempts:  An unsuccessful telephone outreach was attempted for a scheduled appointment today.  Follow Up Plan:  message sent to care guide to call patient to see is she would like to reschedule phone appointment. Additional outreach attempts will be made to offer the patient care coordination information and services.   Encounter Outcome:  No Answer   Care Coordination Interventions:  No, not indicated    Sammuel Hines, LCSW Social Work Care Coordination  [facility] [employer] [phone]

## 2022-07-31 NOTE — Patient Instructions (Signed)
  I am sorry you were unable to keep your phone appointment today.   The Care Guide will contact you to reschedule the phone appointment   Please call me to reschedule  Sammuel Hines, LCSW Social Work Care Coordination  [phone]

## 2022-08-01 NOTE — Progress Notes (Signed)
  Care Coordination Note  08/01/2022 Name: [name] MRN: [id number] DOB: 11-Jun-1965  Hailey Vance is a 57 y.o. year old female who is a primary care patient of Sandford Craze, NP and is actively engaged with the care management team. I reached out to Owens Loffler by phone today to assist with re-scheduling a follow up visit with the RN Case Manager and Licensed Clinical Social Worker  Follow up plan: We have been unable to make contact with the patient for follow up.   Burman Nieves, CCMA Care Coordination Care Guide Direct Dial: [phone]

## 2022-08-11 ENCOUNTER — Ambulatory Visit: Payer: Self-pay | Admitting: Licensed Clinical Social Worker

## 2022-08-11 ENCOUNTER — Ambulatory Visit: Payer: Self-pay

## 2022-08-11 NOTE — Patient Outreach (Signed)
  Care Coordination   Closure  Visit Note   08/11/2022 Name: Sadira Standard MRN: [id number] DOB: 12-08-1965  Makela Niehoff is a 57 y.o. year old female who sees Sandford Craze, NP for primary care.   I received message from Chana Bode, LCSW. Patient no longer needs support from [facility] Coordinator. RNCM will close out of case.   Goals Addressed             This Visit's Progress    COMPLETED: weight loss education       Interventions Today    Flowsheet Row Most Recent Value  General Interventions   General Interventions Discussed/Reviewed General Interventions Reviewed  [Per LCSW, patient no longer needs support from [facility]. RNM will close out of case.]            SDOH assessments and interventions completed:  No  Care Coordination Interventions:  No, not indicated   Follow up plan: No further intervention required.   Encounter Outcome:  Pt. Visit Completed   Kathyrn Sheriff, RN, MSN, BSN, CCM [facility] Care Coordinator [phone]

## 2022-08-11 NOTE — Patient Instructions (Signed)
Social Work Visit Information  Thank you for taking time to visit with me today. Please don't hesitate to contact me if I can be of assistance to you.   Following are the goals we discussed today:   Goals Addressed             This Visit's Progress    Manage physical and Mental Health       Activities and task to complete in order to accomplish goals.   I am [facility] [phone] was unable to get you in sooner with a therapist,  I have called several therapist and only able to locate one that is in network with your insurance and taking new patients Please follow up with the agency below. I spoke with them and they are able to get you in [facility] [address] [place], [place] [address]  [phone]         Our next appointment is by telephone on 08/26/22    Please call the care guide team at [phone] if you need to cancel or reschedule your appointment.   If you or anyone you know are experiencing a Mental Health or Behavioral Health Crisis or need someone to talk to, please call the Suicide and Crisis Lifeline: 988 call the [place] National Suicide Prevention Lifeline: 912-059-3640 or TTY: (320) 547-4429 TTY 843-268-2362) to talk to a trained counselor call 1-800-273-TALK (toll free, 24 hour hotline) go to [facility] [address], [place] [phone])   Patient verbalizes understanding of instructions and care plan provided today and agrees to view in MyChart. Active MyChart status and patient understanding of how to access instructions and care plan via MyChart confirmed with patient.       Sammuel Hines, LCSW Social Work Care Coordination  [facility] [employer] [phone]

## 2022-08-11 NOTE — Patient Outreach (Signed)
  Care Coordination  Follow Up Visit Note   08/11/2022 Name: Hailey Vance MRN: [id number] DOB: 1965/10/08  Hailey Vance is a 57 y.o. year old female who sees Sandford Craze, NP for primary care. I spoke with  Owens Loffler by phone today.  What matters to the patients health and wellness today?  Finding a therapist    Goals Addressed             This Visit's Progress    Manage physical and Mental Health       Activities and task to complete in order to accomplish goals.   I am [facility] [phone] was unable to get you in sooner with a therapist,  I have called several therapist and only able to locate one that is in network with your insurance and taking new patients Please follow up with the agency below. I spoke with them and they are able to get you in [facility] [address] [place], [place] [address]  [phone]        SDOH assessments and interventions completed:  No   Care Coordination Interventions:  Yes, provided  Interventions Today    Flowsheet Row Most Recent Value  Chronic Disease   Chronic disease during today's visit Hypertension (HTN)  General Interventions   General Interventions Discussed/Reviewed General Interventions Reviewed, Communication with  [denies need for ongoing RN care management support]  Communication with --  [mental health providers in network with patient's insurance ( [employer]- not taking new pt's with this insurance,  found provider with [facility]]  Mental Health Interventions   Mental Health Discussed/Reviewed Mental Health Reviewed  [continues to have barriers with connecting for therapy]       Follow up plan: Follow up call scheduled for 08/26/22    Encounter Outcome:  Pt. Visit Completed   Sammuel Hines, LCSW Social Work Care Coordination  [facility] [employer] [phone]

## 2022-08-19 ENCOUNTER — Encounter: Payer: Self-pay | Admitting: Family

## 2022-08-19 ENCOUNTER — Ambulatory Visit (INDEPENDENT_AMBULATORY_CARE_PROVIDER_SITE_OTHER): Payer: 59 | Admitting: Physician Assistant

## 2022-08-19 ENCOUNTER — Encounter: Payer: Self-pay | Admitting: Physician Assistant

## 2022-08-19 ENCOUNTER — Other Ambulatory Visit: Payer: Self-pay | Admitting: Family

## 2022-08-19 ENCOUNTER — Ambulatory Visit: Payer: 59 | Admitting: Family

## 2022-08-19 DIAGNOSIS — M170 Bilateral primary osteoarthritis of knee: Secondary | ICD-10-CM | POA: Diagnosis not present

## 2022-08-19 DIAGNOSIS — M79602 Pain in left arm: Secondary | ICD-10-CM | POA: Diagnosis not present

## 2022-08-19 MED ORDER — TIRZEPATIDE 7.5 MG/0.5ML ~~LOC~~ SOAJ: 7.5000 mg | SUBCUTANEOUS | 0 refills | Status: DC

## 2022-08-19 MED ORDER — METHYLPREDNISOLONE ACETATE 40 MG/ML IJ SUSP
80.0000 mg | INTRAMUSCULAR | Status: AC | PRN
Start: 2022-08-19 — End: 2022-08-19
  Administered 2022-08-19: 80 mg via INTRA_ARTICULAR

## 2022-08-19 MED ORDER — LIDOCAINE HCL 1 % IJ SOLN
2.0000 mL | INTRAMUSCULAR | Status: AC | PRN
Start: 2022-08-19 — End: 2022-08-19
  Administered 2022-08-19: 2 mL

## 2022-08-19 MED ORDER — BUPIVACAINE HCL 0.25 % IJ SOLN
2.0000 mL | INTRAMUSCULAR | Status: AC | PRN
Start: 2022-08-19 — End: 2022-08-19
  Administered 2022-08-19: 2 mL via INTRA_ARTICULAR

## 2022-08-19 NOTE — Progress Notes (Signed)
Office Visit Note   Patient: Hailey Vance           Date of Birth: 1965/08/19           MRN: [id number] Visit Date: 08/19/2022              Requested by: Sandford Craze, NP [address] [place],  [place] [address] PCP: Sandford Craze, NP  Chief Complaint  Patient presents with  . Left Knee - Pain  . Right Knee - Pain      HPI: Patient is a pleasant 57 year old woman with a history of osteoarthritis of her knees.  She is former gotten the injections by Dr. Cleophas Dunker and it has been over 6 months since she had injections.  She is working with her primary care provider with regards to weight loss.  Her hope is at some point to be eligible for knee replacement.  She has had no injury.  Assessment & Plan: Visit Diagnoses:  1. Left arm pain     Plan: Micah Flesher forward with knee injections today she can follow-up with me as needed.  At some point if she wishes to be seen by primary joint specialist we will refer her to Dr. Magnus Ivan per Dr. Hoy Register request  Follow-Up Instructions: Return if symptoms worsen or fail to improve.   Ortho Exam  Patient is alert, oriented, no adenopathy, well-dressed, normal affect, normal respiratory effort. Bilateral knees no effusion no erythema compartments are soft and nontender she is neurovascularly intact.  Imaging: No results found. No images are attached to the encounter.  Labs: Lab Results  Component Value Date   ESRSEDRATE 39 (H) 08/06/2016   REPTSTATUS 08/27/2016 FINAL 08/25/2016   CULT (A) 08/25/2016    MULTIPLE SPECIES PRESENT, SUGGEST RECOLLECTION NO GROUP B STREP (S.AGALACTIAE) ISOLATED Performed at [facility], [address] [place], [place] [address]    LABORGA NO GROWTH 09/10/2016     Lab Results  Component Value Date   ALBUMIN 3.6 05/14/2022   ALBUMIN 3.8 08/22/2020   ALBUMIN 4.0 02/15/2018    No results found for: "MG" Lab Results  Component Value Date   VD25OH 12.99 (L)  05/26/2022   VD25OH 10.38 (L) 05/14/2022   VD25OH 31.48 08/02/2015    No results found for: "PREALBUMIN"    Latest Ref Rng & Units 10/07/2019    4:15 PM 02/16/2018    4:20 AM 02/15/2018    6:03 PM  CBC EXTENDED  WBC 4.0 - 10.5 K/uL 8.8  7.5    RBC 3.87 - 5.11 MIL/uL 4.23  3.71    Hemoglobin 12.0 - 15.0 g/dL 44.0  10.2  72.5   HCT 36.0 - 46.0 % 43.7  38.1  42.8   Platelets 150 - 400 K/uL 242  207    NEUT# 1.7 - 7.7 K/uL  6.0    Lymph# 0.7 - 4.0 K/uL  1.0       There is no height or weight on file to calculate BMI.  Orders:  No orders of the defined types were placed in this encounter.  No orders of the defined types were placed in this encounter.    Procedures: Large Joint Inj: bilateral knee on 08/19/2022 2:23 PM Indications: pain and diagnostic evaluation Details: 25 G 1.5 in needle, anteromedial approach  Arthrogram: No  Medications (Right): 2 mL lidocaine 1 %; 2 mL bupivacaine 0.25 %; 80 mg methylPREDNISolone acetate 40 MG/ML Medications (Left): 2 mL lidocaine 1 %;  2 mL bupivacaine 0.25 %; 80 mg methylPREDNISolone acetate 40 MG/ML Outcome: tolerated well, no immediate complications Procedure, treatment alternatives, risks and benefits explained, specific risks discussed. Consent was given by the patient.    Clinical Data: No additional findings.  ROS:  All other systems negative, except as noted in the HPI. Review of Systems  Objective: Vital Signs: LMP 10/02/2007   Specialty Comments:  No specialty comments available.  PMFS History: Patient Active Problem List   Diagnosis Date Noted  . Gastroesophageal reflux disease 05/14/2022  . History of kidney stones 05/14/2022  . Renal cyst 06/26/2021  . Left shoulder pain 09/12/2020  . Primary osteoarthritis, left shoulder 09/11/2020  . Dysphagia 08/23/2020  . B12 deficiency 08/22/2020  . Hyperlipidemia 08/22/2020  . Moderate episode of recurrent major depressive disorder (HCC) 08/22/2020  . Lumbar  radiculopathy 08/22/2020  . Major depressive disorder   . Osteoarthritis of both hips 06/15/2019  . Osteoarthritis of knees, bilateral 03/29/2019  . Morbid obesity (HCC) 02/15/2018  . Lipoma of left shoulder 12/02/2016  . BMI 50.0-59.9, adult (HCC)   . Preventative health care 10/10/2014  . Essential hypertension 09/06/2014  . Osteoarthritis 09/06/2014  . Post-menopausal bleeding 09/06/2014  . OSA (obstructive sleep apnea) 09/06/2014  . Generalized anxiety disorder 09/06/2014  . Vitamin D deficiency 09/06/2014  . Chronic back pain    Past Medical History:  Diagnosis Date  . Bilateral primary osteoarthritis of knee 03/29/2019  . Chronic back pain   . Cystocele with rectocele   . Generalized anxiety disorder   . GERD (gastroesophageal reflux disease)   . Headache    otc med prn  . Heart murmur    echo in epic 11-26-2017  normal w/ ef 55-60%  . History of chest pain    pt referred for atyical chest pain , cardiology evaluation w/ dr berry note in epic 10-25-2014, stated with low risk w/ no ischemia , nuclear ef 70% , done 09-19-2014 in epic,  non-cardiac chest pain  . HTN (hypertension)    followed by pcp    (normal nuclear stress test 09-19-2014 in epic)  . Low blood potassium   . Major depressive disorder   . OSA (obstructive sleep apnea)    04/2015 study in e- Severe OSA (AHI = 55.0/hour);   (10-06-2019  per pt has not used cpap since 2019 after gastric bypass)  . Osteoarthritis of both hips 06/15/2019  . Plantar fasciitis of left foot   . S/P gastric bypass 02/15/2018   sleeve  . SUI (stress urinary incontinence, female)   . Vitamin D deficiency 09/06/2014  . Wears glasses     Family History  Problem Relation Age of Onset  . Lung cancer Mother 19       dec  . Cancer Mother   . Hypertension Mother   . Arthritis Mother   . Cancer Father 4       prostate  . Hypertension Father   . Leukemia Sister   . Hypertension Sister   . Arthritis Sister   . Hyperlipidemia Sister    . Hypertension Brother   . Arthritis Brother   . Multiple sclerosis Sister   . Hypertension Sister   . Hypertension Sister   . Hypertension Brother   . Hypertension Brother   . Breast cancer Paternal Aunt   . Alzheimer's disease Paternal Aunt   . Diabetes Maternal Grandmother   . Stroke Maternal Grandmother   . Heart attack Paternal Grandmother   . Asthma Other  Past Surgical History:  Procedure Laterality Date  . BLADDER SUSPENSION N/A 10/12/2019   Procedure: TRANSVAGINAL TAPE (TVT) PROCEDURE;  Surgeon: Carrington Clamp, MD;  [facility];  Service: Gynecology;  Laterality: N/A;  . CESAREAN SECTION W/BTL  1990  . COLONOSCOPY WITH PROPOFOL N/A 08/30/2015   Procedure: COLONOSCOPY WITH PROPOFOL;  Surgeon: Napoleon Form, MD;  Location: [facility];  Service: Endoscopy;  Laterality: N/A;  . CYSTOCELE REPAIR N/A 10/12/2019   Procedure: ANTERIOR (CYSTOCELE);  Surgeon: Carrington Clamp, MD;  Location: [facility];  Service: Gynecology;  Laterality: N/A;  . CYSTOSCOPY N/A 05/23/2015   Procedure: CYSTOSCOPY;  Surgeon: Carrington Clamp, MD;  Location: [facility];  Service: Gynecology;  Laterality: N/A;  . CYSTOSCOPY N/A 10/12/2019   Procedure: CYSTOSCOPY;  Surgeon: Carrington Clamp, MD;  Location: [facility];  Service: Gynecology;  Laterality: N/A;  . HIATAL HERNIA REPAIR N/A 02/15/2018   Procedure: HERNIA REPAIR HIATAL;  Surgeon: Glenna Fellows, MD;  Location: [facility];  Service: General;  Laterality: N/A;  . HYSTEROSCOPY WITH D & C N/A 01/11/2015   Procedure: DILATATION AND CURETTAGE /HYSTEROSCOPY;  Surgeon: Marlow Baars, MD;  Location: [facility];  Service: Gynecology;  Laterality: N/A;  . KNEE ARTHROSCOPY Right 2010  . LAPAROSCOPIC GASTRIC SLEEVE RESECTION N/A 02/15/2018   Procedure: LAPAROSCOPIC GASTRIC SLEEVE RESECTION WITH UPPER ENDO AND ERAS PATHWAY;  Surgeon: Glenna Fellows, MD;  Location: [facility];  Service: General;   Laterality: N/A;  . LAPAROSCOPIC OOPHORECTOMY Bilateral 2004   LEFT OOPHORECTOMY AND PARTIAL RIGHT OOPHORECTOMY  . LIPOMA EXCISION Left 12/02/2016   Procedure: EXCISION LIPOMA OF LEFT SHOULDER;  Surgeon: Valeria Batman, MD;  [facility];  Service: Orthopedics;  Laterality: Left;  . ROBOTIC ASSISTED TOTAL HYSTERECTOMY WITH SALPINGECTOMY Bilateral 05/23/2015   Procedure: ROBOTIC ASSISTED TOTAL HYSTERECTOMY WITH SALPINGECTOMYwith right oophorectomy;  Surgeon: Carrington Clamp, MD;  Location: [facility];  Service: Gynecology;  Laterality: Bilateral;  . UMBILICAL HERNIA REPAIR  2002   Social History   Occupational History  . Occupation: Disability  Tobacco Use  . Smoking status: Never  . Smokeless tobacco: Never  Vaping Use  . Vaping Use: Never used  Substance and Sexual Activity  . Alcohol use: No    Alcohol/week: 0.0 standard drinks of alcohol  . Drug use: Never  . Sexual activity: Not on file    Comment: BTL w/ C/S in 1990

## 2022-08-26 ENCOUNTER — Encounter: Payer: Self-pay | Admitting: Licensed Clinical Social Worker

## 2022-08-26 ENCOUNTER — Telehealth: Payer: Self-pay | Admitting: Licensed Clinical Social Worker

## 2022-08-26 NOTE — Patient Instructions (Signed)
  I am sorry you were unable to keep your phone appointment today.   Please call me to reschedule  Delon Revelo, LCSW Social Work Care Coordination  [phone]  

## 2022-08-26 NOTE — Patient Outreach (Signed)
  Care Coordination   08/26/2022 Name: Hailey Vance MRN: [id number] DOB: 12-29-1965   Care Coordination Outreach Attempts:  An unsuccessful telephone outreach was attempted for a scheduled appointment today.  Follow Up Plan:  Additional outreach attempts will be made to offer the patient care coordination information and services. Will route to care guide if no return call is received.   Encounter Outcome:  No Answer   Care Coordination Interventions:  No, not indicated    Sammuel Hines, LCSW Social Work Care Coordination  [facility] [employer] [employer] [phone]

## 2022-09-08 ENCOUNTER — Telehealth: Payer: Self-pay | Admitting: *Deleted

## 2022-09-08 NOTE — Progress Notes (Unsigned)
  Care Coordination Note  09/08/2022 Name: Hailey Vance MRN: [id number] DOB: 1966/02/01  Hailey Vance is a 57 y.o. year old female who is a primary care patient of Sandford Craze, NP and is actively engaged with the care management team. I reached out to Owens Loffler by phone today to assist with re-scheduling a follow up visit with the Licensed Clinical Social Worker  Follow up plan: Unsuccessful telephone outreach attempt made. A HIPAA compliant phone message was left for the patient providing contact information and requesting a return call.   Burman Nieves, CCMA Care Coordination Care Guide Direct Dial: [phone]

## 2022-09-09 ENCOUNTER — Other Ambulatory Visit: Payer: Self-pay | Admitting: Family

## 2022-09-09 ENCOUNTER — Encounter: Payer: Self-pay | Admitting: *Deleted

## 2022-09-10 NOTE — Progress Notes (Signed)
  Care Coordination Note  09/10/2022 Name: Hailey Vance MRN: [id number] DOB: [birth year]  Hailey Vance is a 57 y.o. year old female who is a primary care patient of Sandford Craze, NP and is actively engaged with the care management team. I reached out to Owens Loffler by phone today to assist with re-scheduling a follow up visit with the Licensed Clinical Social Worker  Follow up plan: We have been unable to make contact with the patient for follow up.   Burman Nieves, CCMA Care Coordination Care Guide Direct Dial: [phone]

## 2022-09-12 ENCOUNTER — Encounter: Payer: Self-pay | Admitting: Family

## 2022-09-12 ENCOUNTER — Ambulatory Visit (INDEPENDENT_AMBULATORY_CARE_PROVIDER_SITE_OTHER): Payer: 59 | Admitting: Family

## 2022-09-12 VITALS — BP 144/90 | HR 72 | Temp 98.0°F | Ht 64.0 in | Wt 272.2 lb

## 2022-09-12 DIAGNOSIS — M5442 Lumbago with sciatica, left side: Secondary | ICD-10-CM

## 2022-09-12 DIAGNOSIS — E559 Vitamin D deficiency, unspecified: Secondary | ICD-10-CM

## 2022-09-12 DIAGNOSIS — M5441 Lumbago with sciatica, right side: Secondary | ICD-10-CM

## 2022-09-12 DIAGNOSIS — G4733 Obstructive sleep apnea (adult) (pediatric): Secondary | ICD-10-CM

## 2022-09-12 DIAGNOSIS — F411 Generalized anxiety disorder: Secondary | ICD-10-CM | POA: Diagnosis not present

## 2022-09-12 DIAGNOSIS — G8929 Other chronic pain: Secondary | ICD-10-CM

## 2022-09-12 DIAGNOSIS — E785 Hyperlipidemia, unspecified: Secondary | ICD-10-CM

## 2022-09-12 DIAGNOSIS — Z6841 Body Mass Index (BMI) 40.0 and over, adult: Secondary | ICD-10-CM

## 2022-09-12 DIAGNOSIS — I10 Essential (primary) hypertension: Secondary | ICD-10-CM | POA: Diagnosis not present

## 2022-09-12 DIAGNOSIS — F331 Major depressive disorder, recurrent, moderate: Secondary | ICD-10-CM

## 2022-09-12 DIAGNOSIS — E538 Deficiency of other specified B group vitamins: Secondary | ICD-10-CM | POA: Diagnosis not present

## 2022-09-12 DIAGNOSIS — K219 Gastro-esophageal reflux disease without esophagitis: Secondary | ICD-10-CM | POA: Diagnosis not present

## 2022-09-12 MED ORDER — VITAMIN D (ERGOCALCIFEROL) 1.25 MG (50000 UNIT) PO CAPS
50000.0000 [IU] | ORAL_CAPSULE | ORAL | 0 refills | Status: DC
Start: 2022-09-12 — End: 2022-12-30

## 2022-09-12 MED ORDER — METOPROLOL SUCCINATE ER 50 MG PO TB24: 75.0000 mg | ORAL_TABLET | Freq: Every day | ORAL | 1 refills | Status: DC

## 2022-09-12 NOTE — Assessment & Plan Note (Signed)
Lab Results  Component Value Date   CHOL 200 05/14/2022   HDL 82.70 05/14/2022   LDLCALC 104 (H) 05/14/2022   TRIG 66.0 05/14/2022   CHOLHDL 2 05/14/2022   Last lipid panel looked good.  Not on statin, monitor.

## 2022-09-12 NOTE — Assessment & Plan Note (Signed)
Mood is stable- continues  wellbutrin/effexor.

## 2022-09-12 NOTE — Assessment & Plan Note (Signed)
Stable, continues cpap.

## 2022-09-12 NOTE — Progress Notes (Unsigned)
Subjective:     Patient ID: Hailey Vance, female    DOB: 1965-05-16, 57 y.o.   MRN: [id number]  Chief Complaint  Patient presents with   Follow up    Follow up Shaky hands, nauseous, sweaty, extra salvia "watery" mouth     HPI  Discussed the use of AI scribe software for clinical note transcription with the patient, who gave verbal consent to proceed.  History of Present Illness         Wt Readings from Last 3 Encounters:  09/12/22 272 lb 3.2 oz (123.5 kg)  05/14/22 272 lb (123.4 kg)  08/05/21 (!) 324 lb (147 kg)        Health Maintenance Due  Topic Date Due   Zoster Vaccines- Shingrix (1 of 2) Never done   COVID-19 Vaccine (2 - Pfizer risk series) 09/12/2020   PAP SMEAR-Modifier  11/20/2020   MAMMOGRAM  05/19/2022   Medicare Annual Wellness (AWV)  08/06/2022    Past Medical History:  Diagnosis Date   Bilateral primary osteoarthritis of knee 03/29/2019   Chronic back pain    Cystocele with rectocele    Generalized anxiety disorder    GERD (gastroesophageal reflux disease)    Headache    otc med prn   Heart murmur    echo in epic 11-26-2017  normal w/ ef 55-60%   History of chest pain    pt referred for atyical chest pain , cardiology evaluation w/ dr berry note in epic 10-25-2014, stated with low risk w/ no ischemia , nuclear ef 70% , done 09-19-2014 in epic,  non-cardiac chest pain   HTN (hypertension)    followed by pcp    (normal nuclear stress test 09-19-2014 in epic)   Low blood potassium    Major depressive disorder    OSA (obstructive sleep apnea)    04/2015 study in e- Severe OSA (AHI = 55.0/hour);   (10-06-2019  per pt has not used cpap since 2019 after gastric bypass)   Osteoarthritis of both hips 06/15/2019   Plantar fasciitis of left foot    S/P gastric bypass 02/15/2018   sleeve   SUI (stress urinary incontinence, female)    Vitamin D deficiency 09/06/2014   Wears glasses     Past Surgical History:  Procedure Laterality Date    BLADDER SUSPENSION N/A 10/12/2019   Procedure: TRANSVAGINAL TAPE (TVT) PROCEDURE;  Surgeon: Carrington Clamp, MD;  Location: [facility];  Service: Gynecology;  Laterality: N/A;   CESAREAN SECTION W/BTL  1990   COLONOSCOPY WITH PROPOFOL N/A 08/30/2015   Procedure: COLONOSCOPY WITH PROPOFOL;  Surgeon: Napoleon Form, MD;  Location: [facility];  Service: Endoscopy;  Laterality: N/A;   CYSTOCELE REPAIR N/A 10/12/2019   Procedure: ANTERIOR (CYSTOCELE);  Surgeon: Carrington Clamp, MD;  Location: [facility];  Service: Gynecology;  Laterality: N/A;   CYSTOSCOPY N/A 05/23/2015   Procedure: CYSTOSCOPY;  Surgeon: Carrington Clamp, MD;  Location: [facility];  Service: Gynecology;  Laterality: N/A;   CYSTOSCOPY N/A 10/12/2019   Procedure: CYSTOSCOPY;  Surgeon: Carrington Clamp, MD;  Location: [facility];  Service: Gynecology;  Laterality: N/A;   HIATAL HERNIA REPAIR N/A 02/15/2018   Procedure: HERNIA REPAIR HIATAL;  Surgeon: Glenna Fellows, MD;  Location: [facility];  Service: General;  Laterality: N/A;   HYSTEROSCOPY WITH D & C N/A 01/11/2015   Procedure: DILATATION AND CURETTAGE /HYSTEROSCOPY;  Surgeon: Marlow Baars, MD;  Location: [facility];  Service: Gynecology;  Laterality: N/A;  KNEE ARTHROSCOPY Right 2010   LAPAROSCOPIC GASTRIC SLEEVE RESECTION N/A 02/15/2018   Procedure: LAPAROSCOPIC GASTRIC SLEEVE RESECTION WITH UPPER ENDO AND ERAS PATHWAY;  Surgeon: Glenna Fellows, MD;  Location: [facility];  Service: General;  Laterality: N/A;   LAPAROSCOPIC OOPHORECTOMY Bilateral 2004   LEFT OOPHORECTOMY AND PARTIAL RIGHT OOPHORECTOMY   LIPOMA EXCISION Left 12/02/2016   Procedure: EXCISION LIPOMA OF LEFT SHOULDER;  Surgeon: Valeria Batman, MD;  Location: [facility];  Service: Orthopedics;  Laterality: Left;   ROBOTIC ASSISTED TOTAL HYSTERECTOMY WITH SALPINGECTOMY Bilateral 05/23/2015   Procedure: ROBOTIC ASSISTED TOTAL HYSTERECTOMY WITH SALPINGECTOMYwith right  oophorectomy;  Surgeon: Carrington Clamp, MD;  Location: [facility];  Service: Gynecology;  Laterality: Bilateral;   UMBILICAL HERNIA REPAIR  2002    Family History  Problem Relation Age of Onset   Lung cancer Mother 66       dec   Cancer Mother    Hypertension Mother    Arthritis Mother    Cancer Father 13       prostate   Hypertension Father    Leukemia Sister    Hypertension Sister    Arthritis Sister    Hyperlipidemia Sister    Hypertension Brother    Arthritis Brother    Multiple sclerosis Sister    Hypertension Sister    Hypertension Sister    Hypertension Brother    Hypertension Brother    Breast cancer Paternal Aunt    Alzheimer's disease Paternal Aunt    Diabetes Maternal Grandmother    Stroke Maternal Grandmother    Heart attack Paternal Grandmother    Asthma Other     Social History   Socioeconomic History   Marital status: Divorced    Spouse name: Not on file   Number of children: Not on file   Years of education: 12   Highest education level: High school graduate  Occupational History   Occupation: Disability  Tobacco Use   Smoking status: Never   Smokeless tobacco: Never  Vaping Use   Vaping status: Never Used  Substance and Sexual Activity   Alcohol use: No    Alcohol/week: 0.0 standard drinks of alcohol   Drug use: Never   Sexual activity: Not on file    Comment: BTL w/ C/S in 1990  Other Topics Concern   Not on file  Social History Narrative   Divorced   Lives with son and grand-daughter   65- Son Dayton Scrape (lives in [place])   1610- Son Harriett Sine lives with patient   15- Daughter Shanda Bumps (lives in [place])   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   Completed some college   Geophysical data processor   Social Determinants of Health   Financial Resource Strain: Low Risk  (08/05/2021)   Overall Financial Resource Strain (CARDIA)    Difficulty of Paying Living Expenses: Not hard at all  Food Insecurity: No Food Insecurity (05/29/2022)   Hunger  Vital Sign    Worried About Running Out of Food in the Last Year: Never true    Ran Out of Food in the Last Year: Never true  Transportation Needs: No Transportation Needs (05/29/2022)   PRAPARE - Administrator, Civil Service (Medical): No    Lack of Transportation (Non-Medical): No  Physical Activity: Inactive (08/05/2021)   Exercise Vital Sign    Days of Exercise per Week: 0 days    Minutes of Exercise per Session: 0 min  Stress: Stress Concern Present (05/29/2022)   [employer] of  Occupational Health - Occupational Stress Questionnaire    Feeling of Stress : Very much  Social Connections: Socially Isolated (08/05/2021)   Social Connection and Isolation Panel [NHANES]    Frequency of Communication with Friends and Family: Once a week    Frequency of Social Gatherings with Friends and Family: More than three times a week    Attends Religious Services: Never    Database administrator or Organizations: No    Attends Banker Meetings: Never    Marital Status: Divorced  Catering manager Violence: Not At Risk (08/05/2021)   Humiliation, Afraid, Rape, and Kick questionnaire    Fear of Current or Ex-Partner: No    Emotionally Abused: No    Physically Abused: No    Sexually Abused: No    Outpatient Medications Prior to Visit  Medication Sig Dispense Refill   albuterol (VENTOLIN HFA) 108 (90 Base) MCG/ACT inhaler INHALE 2 PUFFS BY MOUTH EVERY 6 HOURS AS NEEDED FOR WHEEZING FOR SHORTNESS OF BREATH 9 g 1   amLODipine (NORVASC) 10 MG tablet TAKE 1 TABLET BY MOUTH EVERY DAY 90 tablet 1   Artificial Tear Ointment (DRY EYES OP) Apply to eye as needed.     buPROPion (WELLBUTRIN XL) 300 MG 24 hr tablet TAKE 1 TABLET BY MOUTH ONCE DAILY . APPOINTMENT REQUIRED FOR FUTURE REFILLS 90 tablet 1   Ca Phosphate-Cholecalciferol (CALTRATE GUMMY BITES PO) Take by mouth daily.     Cholecalciferol (VITAMIN D3 GUMMIES PO) Take 2 tablets by mouth daily. Reports 5000 IU takes 2 gummies  daily     cyanocobalamin (VITAMIN B12) 1000 MCG tablet Take 1 tablet (1,000 mcg total) by mouth daily.     cyanocobalamin (VITAMIN B12) 1000 MCG/ML injection Inject 1 mL (1,000 mcg total) into the muscle every 30 (thirty) days. 10 mL 0   diclofenac Sodium (VOLTAREN) 1 % GEL Apply 2 g topically daily as needed. 100 g 2   furosemide (LASIX) 20 MG tablet Take 1 tablet (20 mg total) by mouth daily as needed for edema. 30 tablet 3   gabapentin (NEURONTIN) 300 MG capsule Take 1 capsule (300 mg total) by mouth 3 (three) times daily. 270 capsule 1   lisinopril (ZESTRIL) 30 MG tablet Take 1 tablet (30 mg total) by mouth daily. 90 tablet 0   metoprolol succinate (TOPROL-XL) 50 MG 24 hr tablet TAKE 1 TABLET BY MOUTH EVERY DAY WITH OR IMMEDIATELY FOLLOWING A MEAL 90 tablet 1   multivitamin-iron-minerals-folic acid (CENTRUM) chewable tablet Chew 1 tablet by mouth daily.     NEEDLE, DISP, 25 G (B-D DISP NEEDLE 25GX1") 25G X 1" MISC For B12 injections 50 each 1   nystatin (MYCOSTATIN/NYSTOP) powder Apply 1 Application topically 2 (two) times daily. 60 g 2   omeprazole (PRILOSEC) 40 MG capsule TAKE 1 CAPSULE (40 MG TOTAL) BY MOUTH DAILY. 90 capsule 0   SYRINGE-NEEDLE, DISP, 3 ML 23G X 1" 3 ML MISC For B12 injections 50 each 1   tirzepatide (MOUNJARO) 7.5 MG/0.5ML Pen Inject 7.5 mg into the skin once a week. 2 mL 0   venlafaxine XR (EFFEXOR-XR) 150 MG 24 hr capsule TAKE 1 CAPSULE BY MOUTH DAILY WITH BREAKFAST. 90 capsule 1   Vitamin D, Ergocalciferol, (DRISDOL) 1.25 MG (50000 UNIT) CAPS capsule Take 1 capsule (50,000 Units total) by mouth every 7 (seven) days. 12 capsule 0   No facility-administered medications prior to visit.    Allergies  Allergen Reactions   Penicillins Itching and Other (See Comments)  Has patient had a PCN reaction causing immediate rash, facial/tongue/throat swelling, SOB or lightheadedness with hypotension: No Has patient had a PCN reaction causing severe rash involving mucus  membranes or skin necrosis: No Has patient had a PCN reaction that required hospitalization No Has patient had a PCN reaction occurring within the last 10 years: No If all of the above answers are "NO", then may proceed with Cephalosporin use.     ROS     Objective:    Physical Exam   BP (!) 140/72   Pulse 72   Temp 98 F (36.7 C) (Oral)   Ht 5\' 4"  (1.626 m)   Wt 272 lb 3.2 oz (123.5 kg)   LMP 10/02/2007   SpO2 98%   BMI 46.72 kg/m  Wt Readings from Last 3 Encounters:  09/12/22 272 lb 3.2 oz (123.5 kg)  05/14/22 272 lb (123.4 kg)  08/05/21 (!) 324 lb (147 kg)       Assessment & Plan:   Problem List Items Addressed This Visit   None   I am having Roger Shelter. Zurlo maintain her multivitamin-iron-minerals-folic acid, Ca Phosphate-Cholecalciferol (CALTRATE GUMMY BITES PO), Artificial Tear Ointment (DRY EYES OP), diclofenac Sodium, lisinopril, furosemide, nystatin, gabapentin, buPROPion, albuterol, Vitamin D (Ergocalciferol), cyanocobalamin, Cholecalciferol (VITAMIN D3 GUMMIES PO), venlafaxine XR, amLODipine, metoprolol succinate, B-D DISP NEEDLE 25GX1", SYRINGE-NEEDLE (DISP) 3 ML, cyanocobalamin, tirzepatide, and omeprazole.  No orders of the defined types were placed in this encounter.

## 2022-09-12 NOTE — Assessment & Plan Note (Signed)
Wt Readings from Last 3 Encounters:  09/12/22 272 lb 3.2 oz (123.5 kg)  05/14/22 272 lb (123.4 kg)  08/05/21 (!) 324 lb (147 kg)

## 2022-09-12 NOTE — Assessment & Plan Note (Signed)
Overall fair control 

## 2022-09-12 NOTE — Assessment & Plan Note (Signed)
Great weight loss on mounjaro. Continue same.

## 2022-09-12 NOTE — Assessment & Plan Note (Signed)
Standing more.  We discussed that her ongoing weight loss should continue to help her back pain.  She would like a referral back to orthopedics to discuss possible ESI's.

## 2022-09-12 NOTE — Assessment & Plan Note (Signed)
Improved. Tolerating prilosec prn now.

## 2022-09-15 ENCOUNTER — Other Ambulatory Visit: Payer: Self-pay | Admitting: Physical Medicine and Rehabilitation

## 2022-09-15 ENCOUNTER — Telehealth: Payer: Self-pay

## 2022-09-15 NOTE — Telephone Encounter (Signed)
Spoke with Marchelle Folks from [facility] to confirm Rx for Toprol XL per PCP pt will now take 75mg , one and a half tablets daily.

## 2022-09-22 ENCOUNTER — Encounter: Payer: Self-pay | Admitting: Family

## 2022-09-22 MED ORDER — BUSPIRONE HCL 7.5 MG PO TABS: 7.5000 mg | ORAL_TABLET | Freq: Two times a day (BID) | ORAL | 2 refills | Status: DC

## 2022-09-22 NOTE — Telephone Encounter (Signed)
It does sound like a panic attack.  If she is not already seeing a counselor I would recommend she establish with someone. Please give her the number for [facility].   I will also send a new rx for Buspar twice daily to see if this helps. I would like to see her back in 1 month- virtual visit is OK.

## 2022-09-25 ENCOUNTER — Encounter (HOSPITAL_COMMUNITY): Payer: Self-pay | Admitting: *Deleted

## 2022-09-29 ENCOUNTER — Telehealth: Payer: Self-pay | Admitting: Gastroenterology

## 2022-09-29 NOTE — Telephone Encounter (Signed)
Inbound call from patient stating she is still getting choke on food and liquid. She is scheduled for 12/4. Requesting a call back to discuss rescheduling for endoscopy. Please advise, thank you.

## 2022-09-30 ENCOUNTER — Encounter: Payer: Self-pay | Admitting: Family

## 2022-09-30 MED ORDER — LISINOPRIL 30 MG PO TABS: 30.0000 mg | ORAL_TABLET | Freq: Every day | ORAL | 0 refills | Status: DC

## 2022-09-30 NOTE — Telephone Encounter (Signed)
Spoke with the patient. She tells me she was last seen in February 2023. She was "going through some stuff" and she was unable to follow through with having the EGD. She reports she is continuing to have issues with her swallowing. Moved the patient appointment to October. She understands she needs to be seen in the office to reassess her. She accepts the October appointment.

## 2022-10-01 ENCOUNTER — Ambulatory Visit: Payer: 59 | Admitting: Physical Medicine and Rehabilitation

## 2022-10-01 ENCOUNTER — Encounter: Payer: Self-pay | Admitting: Physical Medicine and Rehabilitation

## 2022-10-01 DIAGNOSIS — M47816 Spondylosis without myelopathy or radiculopathy, lumbar region: Secondary | ICD-10-CM

## 2022-10-01 DIAGNOSIS — G8929 Other chronic pain: Secondary | ICD-10-CM

## 2022-10-01 DIAGNOSIS — M545 Low back pain, unspecified: Secondary | ICD-10-CM | POA: Diagnosis not present

## 2022-10-01 NOTE — Progress Notes (Signed)
Functional Pain Scale - descriptive words and definitions  Unmanageable (7)  Pain interferes with normal ADL's/nothing seems to help/sleep is very difficult/active distractions are very difficult to concentrate on. Severe range order  Average Pain 7  Lower back pain all the way across with radiation in both legs to the thigh. Standing and walking makes pain worse
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
## 2022-10-05 DIAGNOSIS — G4733 Obstructive sleep apnea (adult) (pediatric): Secondary | ICD-10-CM | POA: Diagnosis not present

## 2022-10-22 ENCOUNTER — Other Ambulatory Visit: Payer: Self-pay

## 2022-10-22 ENCOUNTER — Ambulatory Visit: Payer: 59 | Admitting: Physical Medicine and Rehabilitation

## 2022-10-22 VITALS — BP 138/90 | HR 68

## 2022-10-22 DIAGNOSIS — M47816 Spondylosis without myelopathy or radiculopathy, lumbar region: Secondary | ICD-10-CM

## 2022-10-22 MED ORDER — METHYLPREDNISOLONE ACETATE 80 MG/ML IJ SUSP
80.0000 mg | Freq: Once | INTRAMUSCULAR | Status: AC
  Administered 2022-10-22: 80 mg

## 2022-10-22 NOTE — Patient Instructions (Signed)

## 2022-10-22 NOTE — Progress Notes (Signed)
Functional Pain Scale - descriptive words and definitions  Distracting (5)    Aware of pain/able to complete some ADL's but limited by pain/sleep is affected and active distractions are only slightly useful. Moderate range order  Average Pain 8   +Driver, -BT, -Dye Allergies.  Lower back pain on both sides that radiates into the side and back of the legs to the calf

## 2022-10-23 ENCOUNTER — Encounter: Payer: Self-pay | Admitting: Family

## 2022-10-24 MED ORDER — GABAPENTIN 300 MG PO CAPS: 300.0000 mg | ORAL_CAPSULE | Freq: Three times a day (TID) | ORAL | 0 refills | Status: DC

## 2022-10-24 MED ORDER — GABAPENTIN 300 MG PO CAPS: 300.0000 mg | ORAL_CAPSULE | Freq: Three times a day (TID) | ORAL | 0 refills | Status: AC

## 2022-10-24 MED ORDER — VENLAFAXINE HCL ER 150 MG PO CP24: 150.0000 mg | ORAL_CAPSULE | Freq: Every day | ORAL | 0 refills | Status: DC

## 2022-10-24 NOTE — Telephone Encounter (Signed)
Gabapentin rx would not send electronically, rx faxed to [facility] in [place], [place].

## 2022-11-01 NOTE — Progress Notes (Signed)
Hailey Vance - 57 y.o. female MRN [id number]  Date of birth: 04/24/1965  Office Visit Note: Visit Date: 10/22/2022 PCP: Sandford Craze, NP Referred by: Sandford Craze, NP  Subjective: Chief Complaint  Patient presents with   Lower Back - Pain   HPI:  Hailey Vance is a 57 y.o. female who comes in today at the request of Hailey Goodie, FNP for planned Bilateral  L3-4 and L4-5 Lumbar facet/medial branch block with fluoroscopic guidance.  The patient has failed conservative care including home exercise, medications, time and activity modification.  This injection will be diagnostic and hopefully therapeutic.  Please see requesting physician notes for further details and justification.  Exam has shown concordant pain with facet joint loading.   ROS Otherwise per HPI.  Assessment & Plan: Visit Diagnoses:    ICD-10-CM   1. Spondylosis without myelopathy or radiculopathy, lumbar region  M47.816 XR C-ARM NO REPORT    Facet Injection    methylPREDNISolone acetate (DEPO-MEDROL) injection 80 mg      Plan: No additional findings.   Meds & Orders:  Meds ordered this encounter  Medications   methylPREDNISolone acetate (DEPO-MEDROL) injection 80 mg    Orders Placed This Encounter  Procedures   Facet Injection   XR C-ARM NO REPORT    Follow-up: Return for visit to requesting provider as needed.   Procedures: No procedures performed  Lumbar Facet Joint Intra-Articular Injection(s) with Fluoroscopic Guidance  Patient: Hailey Vance      Date of Birth: 1965-11-17 MRN: [id number] PCP: Sandford Craze, NP      Visit Date: 10/22/2022   Universal Protocol:    Date/Time: 10/22/2022  Consent Given By: the patient  Position: PRONE   Additional Comments: Vital signs were monitored before and after the procedure. Patient was prepped and draped in the usual sterile fashion. The correct patient, procedure, and site was verified.   Injection Procedure  Details:  Procedure Site One Meds Administered:  Meds ordered this encounter  Medications   methylPREDNISolone acetate (DEPO-MEDROL) injection 80 mg     Laterality: Bilateral  Location/Site:  L3-L4 L4-L5  Needle size: 22 guage  Needle type: Spinal  Needle Placement: Articular  Findings:  -Comments: Excellent flow of contrast producing a partial arthrogram.  Procedure Details: The fluoroscope beam is vertically oriented in AP, and the inferior recess is visualized beneath the lower pole of the inferior apophyseal process, which represents the target point for needle insertion. When direct visualization is difficult the target point is located at the medial projection of the vertebral pedicle. The region overlying each aforementioned target is locally anesthetized with a 1 to 2 ml. volume of 1% Lidocaine without Epinephrine.   The spinal needle was inserted into each of the above mentioned facet joints using biplanar fluoroscopic guidance. A 0.25 to 0.5 ml. volume of Isovue-250 was injected and a partial facet joint arthrogram was obtained. A single spot film was obtained of the resulting arthrogram.    One to 1.25 ml of the steroid/anesthetic solution was then injected into each of the facet joints noted above.   Additional Comments:  No complications occurred Dressing: 2 x 2 sterile gauze and Band-Aid    Post-procedure details: Patient was observed during the procedure. Post-procedure instructions were reviewed.  Patient left the clinic in stable condition.    Clinical History: CLINICAL DATA:  Lumbar radiculopathy. Bilateral leg weakness. Lumbar radiculopathy, symptoms persist with greater than 6 weeks treatment. Lumbar radiculopathy and bilateral lower extremity weakness. Additional  history provided by scanning technologist: Patient reports lower extremity weakness and ongoing radicular symptoms.   EXAM: MRI LUMBAR SPINE WITHOUT CONTRAST   TECHNIQUE: Multiplanar,  multisequence MR imaging of the lumbar spine was performed. No intravenous contrast was administered.   COMPARISON:  Prior lumbar spine radiographs 08/22/2020 and earlier. Lumbar spine MRI 06/29/2013.   FINDINGS: Segmentation: 5 lumbar vertebrae. The caudal most well-formed intervertebral disc space is designated L5-S1.   Alignment: Trace L4-L5 grade 1 anterolisthesis, new from the prior MRI.   Vertebrae: Vertebral body height is maintained. No significant marrow edema or focal suspicious osseous lesion.   Conus medullaris and cauda equina: Conus extends to the L2 level. No signal abnormality within the visualized distal spinal cord.   Paraspinal and other soft tissues: Multiple large bilateral renal cysts, incompletely imaged and incompletely assessed. Paraspinal soft tissues unremarkable.   Disc levels:   Unless otherwise stated, the level by level findings below have not significantly changed from the prior MRI of 06/29/2013.   Mild disc degeneration at L3-L4, L4-L5 and L5-S1, slightly progressed.   T11-T12: This level is imaged in the sagittal plane only. Slight disc bulge. Mild facet arthrosis and ligamentum flavum hypertrophy. No significant spinal canal or foraminal stenosis.   T12-L1: Slight disc bulge. Mild facet arthrosis. No significant spinal canal or foraminal stenosis.   L1-L2: Minimal facet and ligamentum flavum hypertrophy. No significant disc herniation or stenosis.   L2-L3: Mild facet arthrosis and ligamentum flavum hypertrophy. No significant disc herniation or stenosis.   L3-L4: Small disc bulge. Progressive moderate facet arthrosis with ligamentum flavum hypertrophy. Progressive mild epidural lipomatosis. Progressive mild narrowing of the spinal canal. No significant foraminal stenosis.   L4-L5: Trace grade 1 anterolisthesis, new from the prior exam. Slight disc uncovering. Moderate bilateral facet arthrosis with ligamentum flavum hypertrophy.  Mild epidural lipomatosis. No significant spinal canal stenosis. Minimal relative narrowing of the left neural foramen.   L5-S1: Mild-to-moderate facet arthrosis with ligamentum flavum hypertrophy. No significant disc herniation or stenosis.   IMPRESSION: Lumbar and lower thoracic spondylosis, and mild epidural lipomatosis, as described and slightly progressed at multiple levels since the prior MRI of 06/29/2013. No more than mild spinal canal or neural foraminal narrowing. Mild multilevel disc degeneration, greatest at L3-L4, L3-L4 and L4-L5.   Trace L4-L5 grade 1 anterolisthesis, new from the prior MRI.   Multiple incompletely imaged large bilateral renal cysts.     Electronically Signed   By: Jackey Loge D.O.   On: 06/19/2021 09:48     Objective:  VS:  HT:    WT:   BMI:     BP:(!) 138/90  HR:68bpm  TEMP: ( )  RESP:  Physical Exam Vitals and nursing note reviewed.  Constitutional:      General: She is not in acute distress.    Appearance: Normal appearance. She is not ill-appearing.  HENT:     Head: Normocephalic and atraumatic.     Right Ear: External ear normal.     Left Ear: External ear normal.  Eyes:     Extraocular Movements: Extraocular movements intact.  Cardiovascular:     Rate and Rhythm: Normal rate.     Pulses: Normal pulses.  Pulmonary:     Effort: Pulmonary effort is normal. No respiratory distress.  Abdominal:     General: There is no distension.     Palpations: Abdomen is soft.  Musculoskeletal:        General: Tenderness present.     Cervical back: Neck  supple.     Right lower leg: No edema.     Left lower leg: No edema.     Comments: Patient has good distal strength with no pain over the greater trochanters.  No clonus or focal weakness.  Skin:    Findings: No erythema, lesion or rash.  Neurological:     General: No focal deficit present.     Mental Status: She is alert and oriented to person, place, and time.     Sensory: No sensory  deficit.     Motor: No weakness or abnormal muscle tone.     Coordination: Coordination normal.  Psychiatric:        Mood and Affect: Mood normal.        Behavior: Behavior normal.      Imaging: No results found.

## 2022-11-01 NOTE — Procedures (Signed)
Lumbar Facet Joint Intra-Articular Injection(s) with Fluoroscopic Guidance  Patient: Hailey Vance      Date of Birth: 1965-11-16 MRN: [id number] PCP: Sandford Craze, NP      Visit Date: 10/22/2022   Universal Protocol:    Date/Time: 10/22/2022  Consent Given By: the patient  Position: PRONE   Additional Comments: Vital signs were monitored before and after the procedure. Patient was prepped and draped in the usual sterile fashion. The correct patient, procedure, and site was verified.   Injection Procedure Details:  Procedure Site One Meds Administered:  Meds ordered this encounter  Medications   methylPREDNISolone acetate (DEPO-MEDROL) injection 80 mg     Laterality: Bilateral  Location/Site:  L3-L4 L4-L5  Needle size: 22 guage  Needle type: Spinal  Needle Placement: Articular  Findings:  -Comments: Excellent flow of contrast producing a partial arthrogram.  Procedure Details: The fluoroscope beam is vertically oriented in AP, and the inferior recess is visualized beneath the lower pole of the inferior apophyseal process, which represents the target point for needle insertion. When direct visualization is difficult the target point is located at the medial projection of the vertebral pedicle. The region overlying each aforementioned target is locally anesthetized with a 1 to 2 ml. volume of 1% Lidocaine without Epinephrine.   The spinal needle was inserted into each of the above mentioned facet joints using biplanar fluoroscopic guidance. A 0.25 to 0.5 ml. volume of Isovue-250 was injected and a partial facet joint arthrogram was obtained. A single spot film was obtained of the resulting arthrogram.    One to 1.25 ml of the steroid/anesthetic solution was then injected into each of the facet joints noted above.   Additional Comments:  No complications occurred Dressing: 2 x 2 sterile gauze and Band-Aid    Post-procedure details: Patient was observed  during the procedure. Post-procedure instructions were reviewed.  Patient left the clinic in stable condition.

## 2022-11-05 DIAGNOSIS — G4733 Obstructive sleep apnea (adult) (pediatric): Secondary | ICD-10-CM | POA: Diagnosis not present

## 2022-11-24 ENCOUNTER — Telehealth: Payer: Self-pay | Admitting: Physical Medicine and Rehabilitation

## 2022-11-24 NOTE — Telephone Encounter (Signed)
Patient called to let you know that the two shot she received in her back isn't working. CB#[phone]

## 2022-11-27 NOTE — Telephone Encounter (Signed)
Attempted to call patient to schedule OV, mailbox full

## 2022-12-02 ENCOUNTER — Ambulatory Visit: Payer: 59 | Admitting: Family

## 2022-12-05 DIAGNOSIS — G4733 Obstructive sleep apnea (adult) (pediatric): Secondary | ICD-10-CM | POA: Diagnosis not present

## 2022-12-19 ENCOUNTER — Encounter: Payer: Self-pay | Admitting: Physical Medicine and Rehabilitation

## 2022-12-26 ENCOUNTER — Telehealth: Payer: Self-pay

## 2022-12-26 NOTE — Telephone Encounter (Signed)
Spoke with patient and scheduled OV for 12/29/22.

## 2022-12-26 NOTE — Telephone Encounter (Signed)
-----   Message from [place], [place] E sent at 12/22/2022  9:33 AM EDT ----- Can we call and get her set up for office visit for lower back pain? Thanks

## 2022-12-28 ENCOUNTER — Encounter: Payer: Self-pay | Admitting: Pharmacist

## 2022-12-28 NOTE — Progress Notes (Signed)
Pharmacy Quality Measure Review  Per review of chart and payor information, this patient has been flagged for non-adherence to the  Statin Use in Persons with Diabetes (SUPD) medications this calendar year.    This patient is failing SUPD as a result of being on Mounjaro. Per chart review, no hemoglobin A1c is available to assess.    Lab Results  Component Value Date   LDLCALC 104 (H) 05/14/2022     The 10-year ASCVD risk score (Arnett DK, et al., 2019) is: 5.8%   Values used to calculate the score:     Age: 57 years     Sex: Female     Is Non-Hispanic African American: No     Diabetic: Yes     Tobacco smoker: No     Systolic Blood Pressure: 138 mmHg     Is BP treated: Yes     HDL Cholesterol: 82.7 mg/dL     Total Cholesterol: 200 mg/dL   Moderate-intensity statin recommended because of 10-year risk between 5-7.5%. Next appointment with PCP is on 12/31/2022. If clinically appropriate, please consider addition of statin like atorvastatin 20mg  or rosuvastatin 10mg  daily.   Additionally, if an exclusion code is applicable, please consider associating an exclusion code (see options below).   Henrene Pastor, PharmD Clinical Pharmacist [facility]  [facility] [phone]       Code for past statin intolerance or other exclusions (required annually)   Drug Induced Myopathy G72.0   Myositis, unspecified M60.9   Rhabdomyolysis M62.82   Cirrhosis of liver K74.69   Biliary cirrhosis, unspecified K74.5   Abnormal blood glucose - for SUPD ONLY R73.09   Prediabetes - for SUPD ONLY  R73.03   Polycystic ovarian syndrome E28.2

## 2022-12-29 ENCOUNTER — Ambulatory Visit: Payer: 59 | Admitting: Physical Medicine and Rehabilitation

## 2022-12-29 NOTE — Progress Notes (Signed)
Hailey Vance    [id number]    13-Oct-1965  Primary Care Physician:O'Sullivan, Efraim Kaufmann, NP  Referring Physician: Sandford Craze, NP [address] [place],  [place] [address]   Chief complaint:  No chief complaint on file.  HPI: 57 year old very pleasant female with morbid obesity, BMI greater than 55 here with complaints of worsening dysphagia  Last seen on 04-23-21  Today,   GI Hx: DG Abd 2 views 05-14-22 1.0 cm calcification overlying the left kidney, slightly increased in size since November 2019, compatible with a renal stone.  US Abdomen limited RUQ 04-28-21 Right renal cysts, otherwise unremarkable right upper quadrant ultrasound.  Barium esophagram Jul 04, 2020 1. Apparent mild narrowing of the gastroesophageal junction,estimated at 9-10 mm. This along with esophageal dysmotility,reflected by tertiary contractions and barium stasis, led to barium stasis within the esophagus. 2. No mass. No evidence of esophagitis. No reflux documented during the exam.  Colonoscopy 08/30/2015: Normal     Current Outpatient Medications:    albuterol (VENTOLIN HFA) 108 (90 Base) MCG/ACT inhaler, INHALE 2 PUFFS BY MOUTH EVERY 6 HOURS AS NEEDED FOR WHEEZING FOR SHORTNESS OF BREATH, Disp: 9 g, Rfl: 1   amLODipine (NORVASC) 10 MG tablet, TAKE 1 TABLET BY MOUTH EVERY DAY, Disp: 90 tablet, Rfl: 1   Artificial Tear Ointment (DRY EYES OP), Apply to eye as needed., Disp: , Rfl:    buPROPion (WELLBUTRIN XL) 300 MG 24 hr tablet, TAKE 1 TABLET BY MOUTH ONCE DAILY . APPOINTMENT REQUIRED FOR FUTURE REFILLS, Disp: 90 tablet, Rfl: 1   busPIRone (BUSPAR) 7.5 MG tablet, Take 1 tablet (7.5 mg total) by mouth 2 (two) times daily., Disp: 60 tablet, Rfl: 2   Ca Phosphate-Cholecalciferol (CALTRATE GUMMY BITES PO), Take by mouth daily., Disp: , Rfl:    Cholecalciferol (VITAMIN D3 GUMMIES PO), Take 2 tablets by mouth daily. Reports 5000 IU takes 2 gummies daily, Disp: , Rfl:     cyanocobalamin (VITAMIN B12) 1000 MCG tablet, Take 1 tablet (1,000 mcg total) by mouth daily., Disp: , Rfl:    cyanocobalamin (VITAMIN B12) 1000 MCG/ML injection, Inject 1 mL (1,000 mcg total) into the muscle every 30 (thirty) days., Disp: 10 mL, Rfl: 0   diclofenac Sodium (VOLTAREN) 1 % GEL, Apply 2 g topically daily as needed., Disp: 100 g, Rfl: 2   furosemide (LASIX) 20 MG tablet, Take 1 tablet (20 mg total) by mouth daily as needed for edema., Disp: 30 tablet, Rfl: 3   gabapentin (NEURONTIN) 300 MG capsule, Take 1 capsule (300 mg total) by mouth 3 (three) times daily., Disp: 270 capsule, Rfl: 0   lisinopril (ZESTRIL) 30 MG tablet, Take 1 tablet (30 mg total) by mouth daily., Disp: 90 tablet, Rfl: 0   metoprolol succinate (TOPROL-XL) 50 MG 24 hr tablet, Take 1.5 tablets (75 mg total) by mouth daily. TAKE 1 TABLET BY MOUTH EVERY DAY WITH OR IMMEDIATELY FOLLOWING A MEAL, Disp: 135 tablet, Rfl: 1   multivitamin-iron-minerals-folic acid (CENTRUM) chewable tablet, Chew 1 tablet by mouth daily., Disp: , Rfl:    NEEDLE, DISP, 25 G (B-D DISP NEEDLE 25GX1") 25G X 1" MISC, For B12 injections, Disp: 50 each, Rfl: 1   nystatin (MYCOSTATIN/NYSTOP) powder, Apply 1 Application topically 2 (two) times daily., Disp: 60 g, Rfl: 2   omeprazole (PRILOSEC) 40 MG capsule, TAKE 1 CAPSULE (40 MG TOTAL) BY MOUTH DAILY., Disp: 90 capsule, Rfl: 0   SYRINGE-NEEDLE, DISP, 3 ML  23G X 1" 3 ML MISC, For B12 injections, Disp: 50 each, Rfl: 1   tirzepatide (MOUNJARO) 7.5 MG/0.5ML Pen, Inject 7.5 mg into the skin once a week., Disp: 2 mL, Rfl: 0   venlafaxine XR (EFFEXOR-XR) 150 MG 24 hr capsule, Take 1 capsule (150 mg total) by mouth daily with breakfast., Disp: 90 capsule, Rfl: 0   Vitamin D, Ergocalciferol, (DRISDOL) 1.25 MG (50000 UNIT) CAPS capsule, Take 1 capsule (50,000 Units total) by mouth every 7 (seven) days., Disp: 12 capsule, Rfl: 0   Allergies as of 12/30/2022 - Review Complete 10/01/2022  Allergen Reaction Noted    Penicillins Itching and Other (See Comments) 04/27/2011    Past Medical History:  Diagnosis Date   Bilateral primary osteoarthritis of knee 03/29/2019   Chronic back pain    Cystocele with rectocele    Generalized anxiety disorder    GERD (gastroesophageal reflux disease)    Headache    otc med prn   Heart murmur    echo in epic 11-26-2017  normal w/ ef 55-60%   History of chest pain    pt referred for atyical chest pain , cardiology evaluation w/ dr berry note in epic 10-25-2014, stated with low risk w/ no ischemia , nuclear ef 70% , done 09-19-2014 in epic,  non-cardiac chest pain   HTN (hypertension)    followed by pcp    (normal nuclear stress test 09-19-2014 in epic)   Low blood potassium    Major depressive disorder    OSA (obstructive sleep apnea)    04/2015 study in e- Severe OSA (AHI = 55.0/hour);   (10-06-2019  per pt has not used cpap since 2019 after gastric bypass)   Osteoarthritis of both hips 06/15/2019   Plantar fasciitis of left foot    S/P gastric bypass 02/15/2018   sleeve   SUI (stress urinary incontinence, female)    Vitamin D deficiency 09/06/2014   Wears glasses     Past Surgical History:  Procedure Laterality Date   BLADDER SUSPENSION N/A 10/12/2019   Procedure: TRANSVAGINAL TAPE (TVT) PROCEDURE;  Surgeon: Carrington Clamp, MD;  Location: [facility];  Service: Gynecology;  Laterality: N/A;   CESAREAN SECTION W/BTL  1990   COLONOSCOPY WITH PROPOFOL N/A 08/30/2015   Procedure: COLONOSCOPY WITH PROPOFOL;  Surgeon: Napoleon Form, MD;  Location: [facility];  Service: Endoscopy;  Laterality: N/A;   CYSTOCELE REPAIR N/A 10/12/2019   Procedure: ANTERIOR (CYSTOCELE);  Surgeon: Carrington Clamp, MD;  Location: [facility];  Service: Gynecology;  Laterality: N/A;   CYSTOSCOPY N/A 05/23/2015   Procedure: CYSTOSCOPY;  Surgeon: Carrington Clamp, MD;  Location: [facility];  Service: Gynecology;  Laterality: N/A;   CYSTOSCOPY N/A 10/12/2019    Procedure: CYSTOSCOPY;  Surgeon: Carrington Clamp, MD;  Location: [facility];  Service: Gynecology;  Laterality: N/A;   HIATAL HERNIA REPAIR N/A 02/15/2018   Procedure: HERNIA REPAIR HIATAL;  Surgeon: Glenna Fellows, MD;  Location: [facility];  Service: General;  Laterality: N/A;   HYSTEROSCOPY WITH D & C N/A 01/11/2015   Procedure: DILATATION AND CURETTAGE /HYSTEROSCOPY;  Surgeon: Marlow Baars, MD;  Location: [facility];  Service: Gynecology;  Laterality: N/A;   KNEE ARTHROSCOPY Right 2010   LAPAROSCOPIC GASTRIC SLEEVE RESECTION N/A 02/15/2018   Procedure: LAPAROSCOPIC GASTRIC SLEEVE RESECTION WITH UPPER ENDO AND ERAS PATHWAY;  Surgeon: Glenna Fellows, MD;  Location: [facility];  Service: General;  Laterality: N/A;   LAPAROSCOPIC OOPHORECTOMY Bilateral 2004   LEFT OOPHORECTOMY  AND PARTIAL RIGHT OOPHORECTOMY   LIPOMA EXCISION Left 12/02/2016   Procedure: EXCISION LIPOMA OF LEFT SHOULDER;  Surgeon: Valeria Batman, MD;  Location: [facility];  Service: Orthopedics;  Laterality: Left;   ROBOTIC ASSISTED TOTAL HYSTERECTOMY WITH SALPINGECTOMY Bilateral 05/23/2015   Procedure: ROBOTIC ASSISTED TOTAL HYSTERECTOMY WITH SALPINGECTOMYwith right oophorectomy;  Surgeon: Carrington Clamp, MD;  Location: [facility];  Service: Gynecology;  Laterality: Bilateral;   UMBILICAL HERNIA REPAIR  2002    Family History  Problem Relation Age of Onset   Lung cancer Mother 27       dec   Cancer Mother    Hypertension Mother    Arthritis Mother    Cancer Father 13       prostate   Hypertension Father    Leukemia Sister    Hypertension Sister    Arthritis Sister    Hyperlipidemia Sister    Hypertension Brother    Arthritis Brother    Multiple sclerosis Sister    Hypertension Sister    Hypertension Sister    Hypertension Brother    Hypertension Brother    Breast cancer Paternal Aunt    Alzheimer's disease Paternal Aunt    Diabetes Maternal Grandmother    Stroke Maternal Grandmother    Heart  attack Paternal Grandmother    Asthma Other     Social History   Socioeconomic History   Marital status: Divorced    Spouse name: Not on file   Number of children: Not on file   Years of education: 12   Highest education level: High school graduate  Occupational History   Occupation: Disability  Tobacco Use   Smoking status: Never   Smokeless tobacco: Never  Vaping Use   Vaping status: Never Used  Substance and Sexual Activity   Alcohol use: No    Alcohol/week: 0.0 standard drinks of alcohol   Drug use: Never   Sexual activity: Not on file    Comment: BTL w/ C/S in 1990  Other Topics Concern   Not on file  Social History Narrative   Divorced   Lives with son and grand-daughter   89- Son Dayton Scrape (lives in [place])   1191- Son Harriett Sine lives with patient   63- Daughter Shanda Bumps (lives in [place])   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   Completed some college   Geophysical data processor   Social Determinants of Health   Financial Resource Strain: Low Risk  (08/05/2021)   Overall Financial Resource Strain (CARDIA)    Difficulty of Paying Living Expenses: Not hard at all  Food Insecurity: No Food Insecurity (05/29/2022)   Hunger Vital Sign    Worried About Running Out of Food in the Last Year: Never true    Ran Out of Food in the Last Year: Never true  Transportation Needs: No Transportation Needs (05/29/2022)   PRAPARE - Administrator, Civil Service (Medical): No    Lack of Transportation (Non-Medical): No  Physical Activity: Inactive (08/05/2021)   Exercise Vital Sign    Days of Exercise per Week: 0 days    Minutes of Exercise per Session: 0 min  Stress: Stress Concern Present (05/29/2022)   [employer] of Occupational Health - Occupational Stress Questionnaire    Feeling of Stress : Very much  Social Connections: Socially Isolated (08/05/2021)   Social Connection and Isolation Panel [NHANES]    Frequency of Communication with Friends and Family: Once a  week    Frequency of Social Gatherings with  Friends and Family: More than three times a week    Attends Religious Services: Never    Database administrator or Organizations: No    Attends Banker Meetings: Never    Marital Status: Divorced  Catering manager Violence: Not At Risk (08/05/2021)   Humiliation, Afraid, Rape, and Kick questionnaire    Fear of Current or Ex-Partner: No    Emotionally Abused: No    Physically Abused: No    Sexually Abused: No     Review of systems: Review of Systems    Physical Exam: There were no vitals filed for this visit.  There is no height or weight on file to calculate BMI. General: well-appearing ***  Eyes: sclera anicteric, no redness ENT: oral mucosa moist without lesions, no cervical or supraclavicular lymphadenopathy CV: RRR, no JVD, no peripheral edema Resp: clear to auscultation bilaterally, normal RR and effort noted GI: soft, no tenderness, with active bowel sounds. No guarding or palpable organomegaly noted. Skin; warm and dry, no rash or jaundice noted Neuro: awake, alert and oriented x 3. Normal gross motor function and fluent speech   Data Reviewed:  Reviewed labs, radiology imaging, old records and pertinent past GI work up   Assessment and Plan/Recommendations:  57 year old very pleasant female with morbid obesity, BMI >55 with complaints of dysphagia and epigastric abdominal pain  Reviewed barium esophagram, evidence of peptic stricture and significant barium stasis.  No mass lesion or significant GERD was noted on exam Schedule for EGD at [facility] for esophageal dilation and biopsies as needed The risks and benefits as well as alternatives of endoscopic procedure(s) have been discussed and reviewed. All questions answered. The patient agrees to proceed.  Advised patient to chew food well, avoid dry tough meat or chunky bread until EGD  Epigastric abdominal pain: Obtain right upper  quadrant ultrasound to exclude gallstones or cholecystitis We will also evaluate for erosive gastritis or peptic ulcer disease during EGD  Due for colorectal cancer screening in 2027  Return in 3 months   The patient was provided an opportunity to ask questions and all were answered. The patient agreed with the plan and demonstrated an understanding of the instructions.  Iona Beard , MD    Ladona Mow Hewitt Shorts as a scribe for Marsa Aris, MD.,have documented all relevant documentation on the behalf of Marsa Aris, MD,as directed by  Marsa Aris, MD while in the presence of Marsa Aris, MD.   I, Marsa Aris, MD, have reviewed all documentation for this visit. The documentation on 12/29/22 for the exam, diagnosis, procedures, and orders are all accurate and complete.   CC: Sandford Craze, NP

## 2022-12-30 ENCOUNTER — Ambulatory Visit (INDEPENDENT_AMBULATORY_CARE_PROVIDER_SITE_OTHER): Payer: 59 | Admitting: Gastroenterology

## 2022-12-30 ENCOUNTER — Encounter: Payer: Self-pay | Admitting: Physical Medicine and Rehabilitation

## 2022-12-30 ENCOUNTER — Encounter: Payer: Self-pay | Admitting: Gastroenterology

## 2022-12-30 ENCOUNTER — Other Ambulatory Visit: Payer: Self-pay | Admitting: Physical Medicine and Rehabilitation

## 2022-12-30 ENCOUNTER — Ambulatory Visit (INDEPENDENT_AMBULATORY_CARE_PROVIDER_SITE_OTHER): Payer: 59 | Admitting: Physical Medicine and Rehabilitation

## 2022-12-30 VITALS — BP 122/90 | HR 70 | Ht 64.0 in | Wt 274.4 lb

## 2022-12-30 DIAGNOSIS — K449 Diaphragmatic hernia without obstruction or gangrene: Secondary | ICD-10-CM | POA: Diagnosis not present

## 2022-12-30 DIAGNOSIS — M6208 Separation of muscle (nontraumatic), other site: Secondary | ICD-10-CM

## 2022-12-30 DIAGNOSIS — K222 Esophageal obstruction: Secondary | ICD-10-CM | POA: Diagnosis not present

## 2022-12-30 DIAGNOSIS — R1310 Dysphagia, unspecified: Secondary | ICD-10-CM

## 2022-12-30 DIAGNOSIS — M5416 Radiculopathy, lumbar region: Secondary | ICD-10-CM | POA: Diagnosis not present

## 2022-12-30 DIAGNOSIS — K219 Gastro-esophageal reflux disease without esophagitis: Secondary | ICD-10-CM | POA: Diagnosis not present

## 2022-12-30 DIAGNOSIS — G8929 Other chronic pain: Secondary | ICD-10-CM | POA: Diagnosis not present

## 2022-12-30 DIAGNOSIS — M5442 Lumbago with sciatica, left side: Secondary | ICD-10-CM

## 2022-12-30 MED ORDER — FAMOTIDINE 20 MG PO TABS: 20.0000 mg | ORAL_TABLET | Freq: Every day | ORAL | 3 refills | Status: AC

## 2022-12-30 MED ORDER — OMEPRAZOLE 40 MG PO CPDR: 40.0000 mg | DELAYED_RELEASE_CAPSULE | Freq: Every day | ORAL | 3 refills | Status: AC

## 2022-12-30 NOTE — Patient Instructions (Addendum)
VISIT SUMMARY:  During today's visit, we discussed your ongoing issues with swallowing due to esophageal narrowing, your abdominal hernia, and your weight management goals. We also reviewed your current medications and made some adjustments to ensure they are effective.  YOUR PLAN:  -ESOPHAGEAL STRICTURE: Esophageal stricture is a narrowing of the esophagus that makes swallowing difficult. We have scheduled an upper endoscopy with dilation for February 03, 2023, at 10:00 AM to help widen your esophagus. Continue taking omeprazole, but open the capsule and mix it with applesauce to ensure it reaches your stomach.  -GASTROESOPHAGEAL REFLUX DISEASE (GERD): GERD is a condition where stomach acid frequently flows back into the esophagus, causing heartburn. We have added Pepcid to be taken at night and advised you to continue taking omeprazole by opening the capsule and mixing it with applesauce.  -ABDOMINAL HERNIA: An abdominal hernia occurs when an internal part of the body pushes through a weakness in the muscle or surrounding tissue wall. You have a diastasis recti with fat protrusion, which is not involving the bowel. We recommend gentle massage to reduce the hernia when lying down and wearing a support belt or shapewear when active.  -WEIGHT MANAGEMENT: You have lost 50 pounds and aim to lose an additional 50 pounds before your knee surgery. Continue your weight loss efforts for better surgical outcomes.  -MEDICATION ADMINISTRATION: Due to your esophageal stricture, some medications may not be reaching your stomach. Discuss with your primary care provider about potentially opening capsules for medications like gabapentin, Wellbutrin, or venlafaxine to ensure they are effective.  INSTRUCTIONS:  Please follow up with your primary care provider to discuss the administration of your other medications. Your upper endoscopy with dilation is scheduled for February 03, 2023, at 10:00 AM. Continue your weight  loss efforts and use the recommended techniques for managing your hernia.  Due to recent changes in healthcare laws, you may see the results of your imaging and laboratory studies on MyChart before your provider has had a chance to review them.  We understand that in some cases there may be results that are confusing or concerning to you. Not all laboratory results come back in the same time frame and the provider may be waiting for multiple results in order to interpret others.  Please give [place] 48 hours in order for your provider to thoroughly review all the results before contacting the office for clarification of your results.    I appreciate the  opportunity to care for you  Thank You   Marsa Aris , MD

## 2022-12-30 NOTE — Progress Notes (Unsigned)
Hailey Vance - 57 y.o. female MRN [id number]  Date of birth: [birth year]  Office Visit Note: Visit Date: 12/30/2022 PCP: Sandford Craze, NP Referred by: Sandford Craze, NP  Subjective: Chief Complaint  Patient presents with   Lower Back - Pain   HPI: Hailey Vance is a 57 y.o. female who comes in today for evaluation of chronic, worsening and severe bilateral lower back pain radiating to left buttock and down lateral leg to foot. Patient is well known to [place], she was previously managed from orthopedic standpoint by Dr. Norlene Campbell. Pain ongoing for several years, worsens with standing and walking. She describes pain as sore and aching sensation, currently rates as 8 out of 10. Some relief of pain with home exercise regimen, rest and use of medications. History of multiple regimens of formal physical therapy/chiropractor treatments with minimal relief of pain. Feels PT caused more severe discomfort. Lumbar MRI imaging from 2023 exhibits moderate facet arthropathy and ligamentum flavum thickening at both L3-L4 and L4-L5. No high grade spinal canal stenosis. More recently she underwent bilateral L3-L4 and L4-5 Lumbar facet/medial branch blocks in our office, she reports significant relief of pain of lower back pain for about 1 month. Her left leg pain seems to be biggest pain generator at this time. She was previously treated by Dr. Patrici Ranks where she underwent multiple lumbar epidural steroid injections with some relief of pain. History of radiofrequency ablation with Dr. Donalee Citrin with minimal relief of pain. She is using wheelchair to assist with ambulation. Patient denies focal weakness, numbness and tingling. No recent trauma or falls.        Review of Systems  Musculoskeletal:  Positive for back pain and myalgias.  Neurological:  Negative for tingling, sensory change, focal weakness and weakness.  All other systems reviewed and are negative.  Otherwise per  HPI.  Assessment & Plan: Visit Diagnoses:    ICD-10-CM   1. Chronic bilateral low back pain with left-sided sciatica  M54.42 Ambulatory referral to Physical Therapy   G89.29     2. Lumbar radiculopathy  M54.16 Ambulatory referral to Physical Therapy    3. Morbid (severe) obesity due to excess calories (HCC)  E66.01 Ambulatory referral to Physical Therapy       Plan: Findings:  Chronic, worsening and severe bilateral lower back pain radiating to left buttock and down lateral leg to foot. Left leg pain seems to be biggest pain generator at this time. Patient continues to have severe pain despite good conservative therapies such as formal physical therapy/chiropractic treatments, home exercise regimen, rest and use of medications. Patients clinical presentation and exam are complex, her symptoms of left leg pain do not directly correlate with lumbar MRI imaging from 2023. At this point, we would not recommend continuing with interventional spine procedures. Tenderness noted upon palpation of left lower leg, more so on lateral side. This seems to be more consistent with some type of chronic pain/central sensitization syndrome such as fibromyalgia. Patient completed fibromyalgia screening in the office today, her symptom severity and widespread pain index scores do meet diagnostic criteria for fibromyalgia. We discussed management of fibromyalgia such as stress reduction, good sleep patterns and consistent physical activity. I also considered medication management with Cymbalta, however she is currently taking several psychiatric medications. I do feel she would benefit from re-grouping with PT, I placed order for aquatic therapy at [facility]. My hope is she can start with water therapy then progress to land based exercises. No red  flag symptoms noted upon exam today.     Meds & Orders: No orders of the defined types were placed in this encounter.   Orders Placed This Encounter  Procedures    Ambulatory referral to Physical Therapy    Follow-up: Return if symptoms worsen or fail to improve.   Procedures: No procedures performed      Clinical History: CLINICAL DATA:  Lumbar radiculopathy. Bilateral leg weakness. Lumbar radiculopathy, symptoms persist with greater than 6 weeks treatment. Lumbar radiculopathy and bilateral lower extremity weakness. Additional history provided by scanning technologist: Patient reports lower extremity weakness and ongoing radicular symptoms.   EXAM: MRI LUMBAR SPINE WITHOUT CONTRAST   TECHNIQUE: Multiplanar, multisequence MR imaging of the lumbar spine was performed. No intravenous contrast was administered.   COMPARISON:  Prior lumbar spine radiographs 08/22/2020 and earlier. Lumbar spine MRI 06/29/2013.   FINDINGS: Segmentation: 5 lumbar vertebrae. The caudal most well-formed intervertebral disc space is designated L5-S1.   Alignment: Trace L4-L5 grade 1 anterolisthesis, new from the prior MRI.   Vertebrae: Vertebral body height is maintained. No significant marrow edema or focal suspicious osseous lesion.   Conus medullaris and cauda equina: Conus extends to the L2 level. No signal abnormality within the visualized distal spinal cord.   Paraspinal and other soft tissues: Multiple large bilateral renal cysts, incompletely imaged and incompletely assessed. Paraspinal soft tissues unremarkable.   Disc levels:   Unless otherwise stated, the level by level findings below have not significantly changed from the prior MRI of 06/29/2013.   Mild disc degeneration at L3-L4, L4-L5 and L5-S1, slightly progressed.   T11-T12: This level is imaged in the sagittal plane only. Slight disc bulge. Mild facet arthrosis and ligamentum flavum hypertrophy. No significant spinal canal or foraminal stenosis.   T12-L1: Slight disc bulge. Mild facet arthrosis. No significant spinal canal or foraminal stenosis.   L1-L2: Minimal facet and  ligamentum flavum hypertrophy. No significant disc herniation or stenosis.   L2-L3: Mild facet arthrosis and ligamentum flavum hypertrophy. No significant disc herniation or stenosis.   L3-L4: Small disc bulge. Progressive moderate facet arthrosis with ligamentum flavum hypertrophy. Progressive mild epidural lipomatosis. Progressive mild narrowing of the spinal canal. No significant foraminal stenosis.   L4-L5: Trace grade 1 anterolisthesis, new from the prior exam. Slight disc uncovering. Moderate bilateral facet arthrosis with ligamentum flavum hypertrophy. Mild epidural lipomatosis. No significant spinal canal stenosis. Minimal relative narrowing of the left neural foramen.   L5-S1: Mild-to-moderate facet arthrosis with ligamentum flavum hypertrophy. No significant disc herniation or stenosis.   IMPRESSION: Lumbar and lower thoracic spondylosis, and mild epidural lipomatosis, as described and slightly progressed at multiple levels since the prior MRI of 06/29/2013. No more than mild spinal canal or neural foraminal narrowing. Mild multilevel disc degeneration, greatest at L3-L4, L3-L4 and L4-L5.   Trace L4-L5 grade 1 anterolisthesis, new from the prior MRI.   Multiple incompletely imaged large bilateral renal cysts.     Electronically Signed   By: Jackey Loge D.O.   On: 06/19/2021 09:48   She reports that she has never smoked. She has never used smokeless tobacco. No results for input(s): "HGBA1C", "LABURIC" in the last 8760 hours.  Objective:  VS:  HT:    WT:   BMI:     BP:   HR: bpm  TEMP: ( )  RESP:  Physical Exam Vitals and nursing note reviewed.  HENT:     Head: Normocephalic and atraumatic.     Right Ear: External ear normal.  Left Ear: External ear normal.     Nose: Nose normal.     Mouth/Throat:     Mouth: Mucous membranes are moist.  Eyes:     Extraocular Movements: Extraocular movements intact.  Cardiovascular:     Rate and Rhythm: Normal  rate.     Pulses: Normal pulses.  Pulmonary:     Effort: Pulmonary effort is normal.  Abdominal:     General: Abdomen is flat. There is no distension.  Musculoskeletal:        General: Tenderness present.     Cervical back: Normal range of motion.     Comments: Patient is slow to rise from seated position to standing. Good lumbar range of motion. No pain noted with facet loading. 5/5 strength noted with bilateral hip flexion, knee flexion/extension, ankle dorsiflexion/plantarflexion and EHL. No clonus noted bilaterally. No pain upon palpation of greater trochanters. No pain with internal/external rotation of bilateral hips. Sensation intact bilaterally. Tenderness noted upon palpation of left lower lateral aspect of leg. Dysesthesias noted to left L5 nerve pattern. Negative slump test bilaterally. Using wheelchair to assist with ambulation. Antalgic gait.   Skin:    General: Skin is warm and dry.     Capillary Refill: Capillary refill takes less than 2 seconds.  Neurological:     Mental Status: She is alert and oriented to person, place, and time.     Gait: Gait abnormal.  Psychiatric:        Mood and Affect: Mood normal.        Behavior: Behavior normal.     Ortho Exam  Imaging: No results found.  Past Medical/Family/Surgical/Social History: Medications & Allergies reviewed per EMR, new medications updated. Patient Active Problem List   Diagnosis Date Noted   Gastroesophageal reflux disease 05/14/2022   History of kidney stones 05/14/2022   Renal cyst 06/26/2021   Left shoulder pain 09/12/2020   Primary osteoarthritis, left shoulder 09/11/2020   Dysphagia 08/23/2020   B12 deficiency 08/22/2020   Hyperlipidemia 08/22/2020   Moderate episode of recurrent major depressive disorder (HCC) 08/22/2020   Lumbar radiculopathy 08/22/2020   Major depressive disorder    Osteoarthritis of both hips 06/15/2019   Osteoarthritis of knees, bilateral 03/29/2019   Morbid obesity (HCC)  02/15/2018   Lipoma of left shoulder 12/02/2016   BMI 45.0-49.9, adult [facility])    Preventative health care 10/10/2014   Essential hypertension 09/06/2014   Osteoarthritis 09/06/2014   Post-menopausal bleeding 09/06/2014   OSA (obstructive sleep apnea) 09/06/2014   Generalized anxiety disorder 09/06/2014   Vitamin D deficiency 09/06/2014   Chronic back pain    Past Medical History:  Diagnosis Date   Bilateral primary osteoarthritis of knee 03/29/2019   Chronic back pain    Cystocele with rectocele    Generalized anxiety disorder    GERD (gastroesophageal reflux disease)    Headache    otc med prn   Heart murmur    echo in epic 11-26-2017  normal w/ ef 55-60%   History of chest pain    pt referred for atyical chest pain , cardiology evaluation w/ dr berry note in epic 10-25-2014, stated with low risk w/ no ischemia , nuclear ef 70% , done 09-19-2014 in epic,  non-cardiac chest pain   HTN (hypertension)    followed by pcp    (normal nuclear stress test 09-19-2014 in epic)   Low blood potassium    Major depressive disorder    OSA (obstructive sleep apnea)    04/2015  study in e- Severe OSA (AHI = 55.0/hour);   (10-06-2019  per pt has not used cpap since 2019 after gastric bypass)   Osteoarthritis of both hips 06/15/2019   Plantar fasciitis of left foot    S/P gastric bypass 02/15/2018   sleeve   SUI (stress urinary incontinence, female)    Vitamin D deficiency 09/06/2014   Wears glasses    Family History  Problem Relation Age of Onset   Lung cancer Mother 62       dec   Cancer Mother    Hypertension Mother    Arthritis Mother    Cancer Father 15       prostate   Hypertension Father    Leukemia Sister    Hypertension Sister    Arthritis Sister    Hyperlipidemia Sister    Hypertension Brother    Arthritis Brother    Multiple sclerosis Sister    Hypertension Sister    Hypertension Sister    Hypertension Brother    Hypertension Brother    Breast cancer Paternal Aunt     Alzheimer's disease Paternal Aunt    Diabetes Maternal Grandmother    Stroke Maternal Grandmother    Heart attack Paternal Grandmother    Asthma Other    Past Surgical History:  Procedure Laterality Date   BLADDER SUSPENSION N/A 10/12/2019   Procedure: TRANSVAGINAL TAPE (TVT) PROCEDURE;  Surgeon: Carrington Clamp, MD;  Location: [facility];  Service: Gynecology;  Laterality: N/A;   CESAREAN SECTION W/BTL  1990   COLONOSCOPY WITH PROPOFOL N/A 08/30/2015   Procedure: COLONOSCOPY WITH PROPOFOL;  Surgeon: Napoleon Form, MD;  Location: [facility];  Service: Endoscopy;  Laterality: N/A;   CYSTOCELE REPAIR N/A 10/12/2019   Procedure: ANTERIOR (CYSTOCELE);  Surgeon: Carrington Clamp, MD;  Location: [facility];  Service: Gynecology;  Laterality: N/A;   CYSTOSCOPY N/A 05/23/2015   Procedure: CYSTOSCOPY;  Surgeon: Carrington Clamp, MD;  Location: [facility];  Service: Gynecology;  Laterality: N/A;   CYSTOSCOPY N/A 10/12/2019   Procedure: CYSTOSCOPY;  Surgeon: Carrington Clamp, MD;  Location: [facility];  Service: Gynecology;  Laterality: N/A;   HIATAL HERNIA REPAIR N/A 02/15/2018   Procedure: HERNIA REPAIR HIATAL;  Surgeon: Glenna Fellows, MD;  Location: [facility];  Service: General;  Laterality: N/A;   HYSTEROSCOPY WITH D & C N/A 01/11/2015   Procedure: DILATATION AND CURETTAGE /HYSTEROSCOPY;  Surgeon: Marlow Baars, MD;  Location: [facility];  Service: Gynecology;  Laterality: N/A;   KNEE ARTHROSCOPY Right 2010   LAPAROSCOPIC GASTRIC SLEEVE RESECTION N/A 02/15/2018   Procedure: LAPAROSCOPIC GASTRIC SLEEVE RESECTION WITH UPPER ENDO AND ERAS PATHWAY;  Surgeon: Glenna Fellows, MD;  Location: [facility];  Service: General;  Laterality: N/A;   LAPAROSCOPIC OOPHORECTOMY Bilateral 2004   LEFT OOPHORECTOMY AND PARTIAL RIGHT OOPHORECTOMY   LIPOMA EXCISION Left 12/02/2016   Procedure: EXCISION LIPOMA OF LEFT SHOULDER;  Surgeon: Valeria Batman, MD;  Location:  [facility];  Service: Orthopedics;  Laterality: Left;   ROBOTIC ASSISTED TOTAL HYSTERECTOMY WITH SALPINGECTOMY Bilateral 05/23/2015   Procedure: ROBOTIC ASSISTED TOTAL HYSTERECTOMY WITH SALPINGECTOMYwith right oophorectomy;  Surgeon: Carrington Clamp, MD;  Location: [facility];  Service: Gynecology;  Laterality: Bilateral;   UMBILICAL HERNIA REPAIR  2002   Social History   Occupational History   Occupation: Disability  Tobacco Use   Smoking status: Never   Smokeless tobacco: Never  Vaping Use   Vaping status: Never Used  Substance and Sexual Activity   Alcohol  use: No    Alcohol/week: 0.0 standard drinks of alcohol   Drug use: Never   Sexual activity: Not on file    Comment: BTL w/ C/S in 1990

## 2022-12-30 NOTE — Progress Notes (Unsigned)
Functional Pain Scale - descriptive words and definitions  Unmanageable (7)  Pain interferes with normal ADL's/nothing seems to help/sleep is very difficult/active distractions are very difficult to concentrate on. Severe range order  Average Pain 10  Lower back pain on both sides that radiates down the side of both legs

## 2022-12-31 ENCOUNTER — Encounter: Payer: Self-pay | Admitting: Gastroenterology

## 2022-12-31 ENCOUNTER — Telehealth: Payer: Self-pay | Admitting: Family

## 2022-12-31 ENCOUNTER — Telehealth (INDEPENDENT_AMBULATORY_CARE_PROVIDER_SITE_OTHER): Payer: 59 | Admitting: Family

## 2022-12-31 DIAGNOSIS — F331 Major depressive disorder, recurrent, moderate: Secondary | ICD-10-CM

## 2022-12-31 DIAGNOSIS — M5416 Radiculopathy, lumbar region: Secondary | ICD-10-CM

## 2022-12-31 DIAGNOSIS — M797 Fibromyalgia: Secondary | ICD-10-CM

## 2022-12-31 DIAGNOSIS — R1310 Dysphagia, unspecified: Secondary | ICD-10-CM

## 2022-12-31 DIAGNOSIS — I10 Essential (primary) hypertension: Secondary | ICD-10-CM | POA: Diagnosis not present

## 2022-12-31 DIAGNOSIS — F411 Generalized anxiety disorder: Secondary | ICD-10-CM | POA: Diagnosis not present

## 2022-12-31 DIAGNOSIS — K219 Gastro-esophageal reflux disease without esophagitis: Secondary | ICD-10-CM

## 2022-12-31 MED ORDER — TIRZEPATIDE 12.5 MG/0.5ML ~~LOC~~ SOAJ
12.5000 mg | SUBCUTANEOUS | 1 refills | Status: DC
Start: 2022-12-31 — End: 2023-08-16

## 2022-12-31 MED ORDER — METOPROLOL SUCCINATE ER 100 MG PO TB24
100.0000 mg | ORAL_TABLET | Freq: Every day | ORAL | 3 refills | Status: DC
Start: 2022-12-31 — End: 2023-10-01

## 2022-12-31 MED ORDER — VENLAFAXINE HCL ER 37.5 MG PO CP24: ORAL_CAPSULE | ORAL | 0 refills | Status: DC

## 2022-12-31 MED ORDER — DULOXETINE HCL 30 MG PO CPEP: ORAL_CAPSULE | ORAL | 0 refills | Status: DC

## 2022-12-31 NOTE — Assessment & Plan Note (Signed)
She does note some variability in her mood. Continue wellbutrin, counseling. Re-evaluate in 3 months.

## 2022-12-31 NOTE — Progress Notes (Addendum)
MyChart Video Visit    Virtual Visit via Video Note    Patient location: Home. Patient and provider in visit Provider location: Office  I discussed the limitations of evaluation and management by telemedicine and the availability of in person appointments. The patient expressed understanding and agreed to proceed.  We had audio issues with the video visit so the visit was transitioned to a telephone visit.   Visit Date: 12/31/2022  Today's healthcare provider: Lemont Fillers, NP     Subjective:    Patient ID: Hailey Vance, female    DOB: 05-22-1965, 57 y.o.   MRN: [id number]  Chief Complaint  Patient presents with   Hypertension    BP was 122/90 yesterday at the gastroenterologist office    Lumbar radiculopathy    Still having pain "on the side of her legs"    HPI The patient, with a history of arthritis and spinal stenosis, presents with severe lower back pain that radiates down the side of her leg to her foot. The pain is constant, not sharp or shooting, and has been worsening over time. It is now causing significant mobility issues, to the point where the patient requires assistance for basic activities such as going to the bathroom and navigating stairs. The pain is worse when the patient stands and puts pressure on her feet. The patient has previously received multiple spinal injections, which have not provided lasting relief. She has also done physical therapy in the past for her back which she did not find helpful. Her specialist, Dr. Alvester Morin- believes that her pain is due to fibromyalgia.   The patient also has a history of gastric sleeve surgery for weight loss and is requesting a follow up with Bariatrics. She has been struggling with weight loss, which she acknowledges could help alleviate some of her back and leg pain. She is currently awaiting a referral for water therapy from Orthopedics who she saw yesterday.   The patient's reflux is being managed  with medication, although there are concerns that the medication may not be fully effective due to a narrowing of the patient's esophagus. She is scheduled with GI for esophageal Dilation in January.   The patient's anxiety is being managed with Buspar and Wellbutrin. She recently had her first appointment with a psychiatrist Drinda Butts [facility]), who is working on adjusting her medication regimen. Mounjaro- no side effects,  was in the 290's prior to beginning mounjaro.  She is currently back on the 53m Wt Readings from Last 3 Encounters:  12/30/22 274 lb 6.4 oz (124.5 kg)  09/12/22 272 lb 3.2 oz (123.5 kg)  05/14/22 272 lb (123.4 kg)   BP Readings from Last 3 Encounters:  12/30/22 (!) 122/90  10/22/22 (!) 138/90  09/12/22 (!) 144/90     Past Medical History:  Diagnosis Date   Bilateral primary osteoarthritis of knee 03/29/2019   Chronic back pain    Cystocele with rectocele    Generalized anxiety disorder    GERD (gastroesophageal reflux disease)    Headache    otc med prn   Heart murmur    echo in epic 11-26-2017  normal w/ ef 55-60%   History of chest pain    pt referred for atyical chest pain , cardiology evaluation w/ dr berry note in epic 10-25-2014, stated with low risk w/ no ischemia , nuclear ef 70% , done 09-19-2014 in epic,  non-cardiac chest pain   HTN (hypertension)    followed by pcp    (  normal nuclear stress test 09-19-2014 in epic)   Low blood potassium    Major depressive disorder    OSA (obstructive sleep apnea)    04/2015 study in e- Severe OSA (AHI = 55.0/hour);   (10-06-2019  per pt has not used cpap since 2019 after gastric bypass)   Osteoarthritis of both hips 06/15/2019   Plantar fasciitis of left foot    S/P gastric bypass 02/15/2018   sleeve   SUI (stress urinary incontinence, female)    Vitamin D deficiency 09/06/2014   Wears glasses     Past Surgical History:  Procedure Laterality Date   BLADDER SUSPENSION N/A 10/12/2019   Procedure: TRANSVAGINAL  TAPE (TVT) PROCEDURE;  Surgeon: Carrington Clamp, MD;  Location: [facility];  Service: Gynecology;  Laterality: N/A;   CESAREAN SECTION W/BTL  1990   COLONOSCOPY WITH PROPOFOL N/A 08/30/2015   Procedure: COLONOSCOPY WITH PROPOFOL;  Surgeon: Napoleon Form, MD;  Location: [facility];  Service: Endoscopy;  Laterality: N/A;   CYSTOCELE REPAIR N/A 10/12/2019   Procedure: ANTERIOR (CYSTOCELE);  Surgeon: Carrington Clamp, MD;  Location[facility];  Service: Gynecology;  Laterality: N/A;   CYSTOSCOPY N/A 05/23/2015   Procedure: CYSTOSCOPY;  Surgeon: Carrington Clamp, MD;  Location: [facility];  Service: Gynecology;  Laterality: N/A;   CYSTOSCOPY N/A 10/12/2019   Procedure: CYSTOSCOPY;  Surgeon: Carrington Clamp, MD;  Location: [facility];  Service: Gynecology;  Laterality: N/A;   HIATAL HERNIA REPAIR N/A 02/15/2018   Procedure: HERNIA REPAIR HIATAL;  Surgeon: Glenna Fellows, MD;  Location: [facility];  Service: General;  Laterality: N/A;   HYSTEROSCOPY WITH D & C N/A 01/11/2015   Procedure: DILATATION AND CURETTAGE /HYSTEROSCOPY;  Surgeon: Marlow Baars, MD;  Location: [facility];  Service: Gynecology;  Laterality: N/A;   KNEE ARTHROSCOPY Right 2010   LAPAROSCOPIC GASTRIC SLEEVE RESECTION N/A 02/15/2018   Procedure: LAPAROSCOPIC GASTRIC SLEEVE RESECTION WITH UPPER ENDO AND ERAS PATHWAY;  Surgeon: Glenna Fellows, MD;  Location: [facility];  Service: General;  Laterality: N/A;   LAPAROSCOPIC OOPHORECTOMY Bilateral 2004   LEFT OOPHORECTOMY AND PARTIAL RIGHT OOPHORECTOMY   LIPOMA EXCISION Left 12/02/2016   Procedure: EXCISION LIPOMA OF LEFT SHOULDER;  Surgeon: Valeria Batman, MD;  Location: [facility];  Service: Orthopedics;  Laterality: Left;   ROBOTIC ASSISTED TOTAL HYSTERECTOMY WITH SALPINGECTOMY Bilateral 05/23/2015   Procedure: ROBOTIC ASSISTED TOTAL HYSTERECTOMY WITH SALPINGECTOMYwith right oophorectomy;  Surgeon: Carrington Clamp, MD;  Location: [facility];   Service: Gynecology;  Laterality: Bilateral;   UMBILICAL HERNIA REPAIR  2002    Family History  Problem Relation Age of Onset   Lung cancer Mother 15       dec   Cancer Mother    Hypertension Mother    Arthritis Mother    Cancer Father 21       prostate   Hypertension Father    Leukemia Sister    Hypertension Sister    Arthritis Sister    Hyperlipidemia Sister    Hypertension Brother    Arthritis Brother    Multiple sclerosis Sister    Hypertension Sister    Hypertension Sister    Hypertension Brother    Hypertension Brother    Breast cancer Paternal Aunt    Alzheimer's disease Paternal Aunt    Diabetes Maternal Grandmother    Stroke Maternal Grandmother    Heart attack Paternal Grandmother    Asthma Other     Social History   Socioeconomic History  Marital status: Divorced    Spouse name: Not on file   Number of children: Not on file   Years of education: 12   Highest education level: High school graduate  Occupational History   Occupation: Disability  Tobacco Use   Smoking status: Never   Smokeless tobacco: Never  Vaping Use   Vaping status: Never Used  Substance and Sexual Activity   Alcohol use: No    Alcohol/week: 0.0 standard drinks of alcohol   Drug use: Never   Sexual activity: Not on file    Comment: BTL w/ C/S in 1990  Other Topics Concern   Not on file  Social History Narrative   Divorced   Lives with son and grand-daughter   71- Son Hailey Vance (lives in [place])   [id number]- Son Hailey Vance lives with patient   21- Daughter Hailey Vance (lives in [place])   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   Completed some college   Geophysical data processor   Social Determinants of Health   Financial Resource Strain: Low Risk  (08/05/2021)   Overall Financial Resource Strain (CARDIA)    Difficulty of Paying Living Expenses: Not hard at all  Food Insecurity: No Food Insecurity (05/29/2022)   Hunger Vital Sign    Worried About Running Out of Food in the Last Year:  Never true    Ran Out of Food in the Last Year: Never true  Transportation Needs: No Transportation Needs (05/29/2022)   PRAPARE - Administrator, Civil Service (Medical): No    Lack of Transportation (Non-Medical): No  Physical Activity: Inactive (08/05/2021)   Exercise Vital Sign    Days of Exercise per Week: 0 days    Minutes of Exercise per Session: 0 min  Stress: Stress Concern Present (05/29/2022)   [employer] of Occupational Health - Occupational Stress Questionnaire    Feeling of Stress : Very much  Social Connections: Socially Isolated (08/05/2021)   Social Connection and Isolation Panel [NHANES]    Frequency of Communication with Friends and Family: Once a week    Frequency of Social Gatherings with Friends and Family: More than three times a week    Attends Religious Services: Never    Database administrator or Organizations: No    Attends Banker Meetings: Never    Marital Status: Divorced  Catering manager Violence: Not At Risk (08/05/2021)   Humiliation, Afraid, Rape, and Kick questionnaire    Fear of Current or Ex-Partner: No    Emotionally Abused: No    Physically Abused: No    Sexually Abused: No    Outpatient Medications Prior to Visit  Medication Sig Dispense Refill   albuterol (VENTOLIN HFA) 108 (90 Base) MCG/ACT inhaler INHALE 2 PUFFS BY MOUTH EVERY 6 HOURS AS NEEDED FOR WHEEZING FOR SHORTNESS OF BREATH 9 g 1   amLODipine (NORVASC) 10 MG tablet TAKE 1 TABLET BY MOUTH EVERY DAY 90 tablet 1   Artificial Tear Ointment (DRY EYES OP) Apply to eye as needed.     buPROPion (WELLBUTRIN XL) 300 MG 24 hr tablet TAKE 1 TABLET BY MOUTH ONCE DAILY . APPOINTMENT REQUIRED FOR FUTURE REFILLS 90 tablet 1   busPIRone (BUSPAR) 7.5 MG tablet Take 1 tablet (7.5 mg total) by mouth 2 (two) times daily. 60 tablet 2   Cholecalciferol (VITAMIN D3 GUMMIES PO) Take 2 tablets by mouth daily. Reports 5000 IU takes 2 gummies daily     cyanocobalamin (VITAMIN B12)  1000 MCG tablet Take 1  tablet (1,000 mcg total) by mouth daily.     cyanocobalamin (VITAMIN B12) 1000 MCG/ML injection Inject 1 mL (1,000 mcg total) into the muscle every 30 (thirty) days. 10 mL 0   diclofenac Sodium (VOLTAREN) 1 % GEL Apply 2 g topically daily as needed. 100 g 2   famotidine (PEPCID) 20 MG tablet Take 1 tablet (20 mg total) by mouth at bedtime. 90 tablet 3   furosemide (LASIX) 20 MG tablet Take 1 tablet (20 mg total) by mouth daily as needed for edema. 30 tablet 3   gabapentin (NEURONTIN) 300 MG capsule Take 1 capsule (300 mg total) by mouth 3 (three) times daily. 270 capsule 0   lisinopril (ZESTRIL) 30 MG tablet Take 1 tablet (30 mg total) by mouth daily. 90 tablet 0   multivitamin-iron-minerals-folic acid (CENTRUM) chewable tablet Chew 1 tablet by mouth daily.     NEEDLE, DISP, 25 G (B-D DISP NEEDLE 25GX1") 25G X 1" MISC For B12 injections 50 each 1   nystatin (MYCOSTATIN/NYSTOP) powder Apply 1 Application topically 2 (two) times daily. 60 g 2   omeprazole (PRILOSEC) 40 MG capsule Take 1 capsule (40 mg total) by mouth daily. Daily 30 minutes before breakfast 90 capsule 3   SYRINGE-NEEDLE, DISP, 3 ML 23G X 1" 3 ML MISC For B12 injections 50 each 1   metoprolol succinate (TOPROL-XL) 50 MG 24 hr tablet Take 1.5 tablets (75 mg total) by mouth daily. TAKE 1 TABLET BY MOUTH EVERY DAY WITH OR IMMEDIATELY FOLLOWING A MEAL 135 tablet 1   tirzepatide (MOUNJARO) 7.5 MG/0.5ML Pen Inject 7.5 mg into the skin once a week. 2 mL 0   venlafaxine XR (EFFEXOR-XR) 150 MG 24 hr capsule Take 1 capsule (150 mg total) by mouth daily with breakfast. 90 capsule 0   No facility-administered medications prior to visit.    Allergies  Allergen Reactions   Penicillins Itching and Other (See Comments)    Has patient had a PCN reaction causing immediate rash, facial/tongue/throat swelling, SOB or lightheadedness with hypotension: No Has patient had a PCN reaction causing severe rash involving mucus  membranes or skin necrosis: No Has patient had a PCN reaction that required hospitalization No Has patient had a PCN reaction occurring within the last 10 years: No If all of the above answers are "NO", then may proceed with Cephalosporin use.     ROS See HPI    Objective:    Physical Exam Constitutional:      General: She is not in acute distress.    Appearance: Normal appearance. She is well-developed.  HENT:     Head: Normocephalic and atraumatic.     Right Ear: External ear normal.     Left Ear: External ear normal.  Eyes:     General: No scleral icterus. Neck:     Thyroid: No thyromegaly.  Cardiovascular:     Rate and Rhythm: Normal rate and regular rhythm.     Heart sounds: Normal heart sounds. No murmur heard. Pulmonary:     Effort: Pulmonary effort is normal. No respiratory distress.     Breath sounds: Normal breath sounds. No wheezing.  Musculoskeletal:     Cervical back: Neck supple.  Skin:    General: Skin is warm and dry.  Neurological:     Mental Status: She is alert and oriented to person, place, and time.  Psychiatric:        Mood and Affect: Mood normal.        Behavior: Behavior normal.  Thought Content: Thought content normal.        Judgment: Judgment normal.     LMP 10/02/2007  Wt Readings from Last 3 Encounters:  12/30/22 274 lb 6.4 oz (124.5 kg)  09/12/22 272 lb 3.2 oz (123.5 kg)  05/14/22 272 lb (123.4 kg)    Gen: Awake, alert, no acute distress Resp: Breathing sounds even and non-labored Psych: calm/pleasant demeanor Neuro: Alert and Oriented x 3,  speech is clear.     Assessment & Plan:   Problem List Items Addressed This Visit       Unprioritized   Morbid obesity (HCC) - Primary    She has not had any recent weight loss on mounjaro 10 mg weekly. She is tolerating without side effect.  Will increase to 12.5mg  and plan to refer her back to [facility]. I agree with the plan for water exercise.       Relevant Medications    tirzepatide (MOUNJARO) 12.5 MG/0.5ML Pen   Other Relevant Orders   Amb Referral to Bariatric Surgery   Major depressive disorder    She does note some variability in her mood. Continue wellbutrin, counseling. Re-evaluate in 3 months.       RESOLVED: Lumbar radiculopathy    She states that she is very limited by her pain and is now chair bound.  I will reach back out to Dr. Alvester Morin to see if he has any suggestions for further steps to treat her pain.       Generalized anxiety disorder    Recently established with a therapist Nanette Funderburk. Continue buspar.        Gastroesophageal reflux disease    Maintained on pepcid and omeprazole. Management per GI.       Fibromyalgia    I would like for her to taper off of effexor (I sent new dose with instructions to her pharmacy). When she completes effexor, begin cymbalta.  Let me know if she has any issues during this transition.       Essential hypertension    BP Readings from Last 3 Encounters:  12/30/22 (!) 122/90  10/22/22 (!) 138/90  09/12/22 (!) 144/90   DBP has been consistently elevated at 90.  Will increase toprol XL from 75mg  to 100mg . Most recent heart rate in the system was 70 bpm.  Continue amlodipine/lisinopril.       Relevant Medications   metoprolol succinate (TOPROL-XL) 100 MG 24 hr tablet   Dysphagia    Scheduled for EGD with GI.        I have discontinued Roger Shelter. Spratt's tirzepatide and metoprolol succinate. I am also having her start on tirzepatide and metoprolol succinate. Additionally, I am having her maintain her multivitamin-iron-minerals-folic acid, Artificial Tear Ointment (DRY EYES OP), diclofenac Sodium, furosemide, nystatin, buPROPion, albuterol, cyanocobalamin, Cholecalciferol (VITAMIN D3 GUMMIES PO), amLODipine, B-D DISP NEEDLE 25GX1", SYRINGE-NEEDLE (DISP) 3 ML, cyanocobalamin, busPIRone, lisinopril, gabapentin, omeprazole, and famotidine.  Meds ordered this encounter  Medications    tirzepatide (MOUNJARO) 12.5 MG/0.5ML Pen    Sig: Inject 12.5 mg into the skin once a week.    Dispense:  6 mL    Refill:  1    Order Specific Question:   Supervising Provider    Answer:   Danise Edge A [[id number]]   metoprolol succinate (TOPROL-XL) 100 MG 24 hr tablet    Sig: Take 1 tablet (100 mg total) by mouth daily. Take with or immediately following a meal.    Dispense:  90 tablet  Refill:  3    Order Specific Question:   Supervising Provider    Answer:   Danise Edge A [[id number]]    I discussed the assessment and treatment plan with the patient. The patient was provided an opportunity to ask questions and all were answered. The patient agreed with the plan and demonstrated an understanding of the instructions.   The patient was advised to call back or seek an in-person evaluation if the symptoms worsen or if the condition fails to improve as anticipated.  30 minutes spent on today's telephone visit.   Lemont Fillers, NP [facility] [phone] (phone) [phone] (fax)  [facility]

## 2022-12-31 NOTE — Telephone Encounter (Signed)
-----   Message from Naaman Plummer sent at 12/31/2022  1:00 PM EDT ----- Thanks for reaching out to me concerning this patient.  My nurse practitioner Ellin Goodie saw Ms. Ardizzone yesterday and her notes should be in the chart today.  I have seen the patient off-and-on a few times and unfortunately she has no spine pathology at all that would cause her disability and/or inability to toilet on her own or be  chair bound.  She seems to have an underlying central sensitization pain syndrome such as fibromyalgia.  There really are no injections at this point.  Again her MRI shows mainly facet arthritis, no nerve compression.  She has had injections by Dr. Wynetta Emery and by Dr. Olevia Perches in the past that did not help at that time either.  If pool therapy is what will get her moving and doing things I think that is an option but I would try to progress that to land-based physical therapy as tolerated.  I do not know if there is any underlying behavioral health issues involved but likely with these patients unfortunately that is usually the case.  She likely would benefit from a more comprehensive pain management approach with pain psychology and medication management and physical therapy etc.  I do feel bad but I do not have much to offer from an interventional spine standpoint.  Sincerely,  Naaman Plummer, MD ----- Message ----- From: Sandford Craze, NP Sent: 12/31/2022  11:22 AM EDT To: Tyrell Antonio, MD  Dear Dr. Alvester Morin,  I am reaching out to you about our mutual patient, Dawayne Cirri.  I met with her today and she told me that she continues to have back pain and pain shooting down her lower legs which is limiting her to being chair bound. She is unable to toilet on her own.  I wanted to see what your thoughts are on next steps? Would she be a candidate for additional ESI injections? Pain medication? Repeat MRI? She told me that she has already done PT without improvement.  She does plan to start some exercise  in the pool.    Thanks,  Sandford Craze NP

## 2022-12-31 NOTE — Assessment & Plan Note (Addendum)
BP Readings from Last 3 Encounters:  12/30/22 (!) 122/90  10/22/22 (!) 138/90  09/12/22 (!) 144/90   DBP has been consistently elevated at 90.  Will increase toprol XL from 75mg  to 100mg . Most recent heart rate in the system was 70 bpm.  Continue amlodipine/lisinopril.

## 2022-12-31 NOTE — Patient Instructions (Addendum)
VISIT SUMMARY:  During today's visit, we discussed your ongoing lower back pain, esophageal stricture, anxiety, hypertension, and follow-up for your previous bariatric surgery. We reviewed your current treatment plans and made some referrals for further evaluation and management.  YOUR PLAN:  -LOWER BACK PAIN/LEG PAIN: Your chronic lower back pain, which radiates down your leg, is likely due to arthritis and fibromyalgia. We will refer you to Dr. Alvester Morin for further evaluation and possibly another spinal injection. If Dr. Alvester Morin has no further treatment options, we may consider referring you to a different pain clinic.  -ESOPHAGEAL STRICTURE: An esophageal stricture is a narrowing of the esophagus that can cause difficulty swallowing. You are scheduled for an esophageal dilation on 02/03/2023 to help alleviate this issue. Please continue with your current treatment plan.  -ANXIETY: Your anxiety is being managed with Buspar and Wellbutrin, and you are seeing a psychiatrist for medication adjustments. Please continue with your current treatment plan.  -HYPERTENSION: Your high blood pressure is well-controlled with Metoprolol, Lisinopril, and Amlodipine. Please continue taking your current medications as prescribed.  -BARIATRIC SURGERY FOLLOW-UP: You had gastric sleeve surgery and are considering a second procedure to aid in weight loss. We will refer you back to [facility] for further evaluation.  -GENERAL HEALTH MAINTENANCE: We will order labs to check your kidney function and B12 level. Additionally, you will receive flu and shingles vaccines at your next nurse visit. Please schedule a follow-up appointment in 3 months.  INSTRUCTIONS:  Please follow up with Dr. Alvester Morin for your lower back pain evaluation and possible injection. Continue with your scheduled esophageal dilation on 02/03/2023. Maintain your current treatment plans for anxiety and hypertension. Follow up with [facility] for  your bariatric surgery evaluation. Schedule your lab tests and vaccines with the nurse, and book a follow-up appointment in 3 months.

## 2022-12-31 NOTE — Assessment & Plan Note (Signed)
She has not had any recent weight loss on mounjaro 10 mg weekly. She is tolerating without side effect.  Will increase to 12.5mg  and plan to refer her back to [facility]. I agree with the plan for water exercise.

## 2022-12-31 NOTE — Telephone Encounter (Signed)
Called but no answer and mailbox was full 

## 2022-12-31 NOTE — Telephone Encounter (Signed)
Please advise pt that I reviewed her case with Dr. Alvester Morin.  He does not feel that her pain is spinal related and feels that her pain may be related to fibromyalgia. One medication that we can try for fibromyalgia is Cymbalta.  Cymbalta can also help with anxiety/depression symptoms.  I would like for her to taper off of effexor (I sent new dose with instructions to her pharmacy). When she completes effexor, begin cymbalta.  Let me know if she has any issues during this transition.   Follow up with me in 1 month.

## 2022-12-31 NOTE — Telephone Encounter (Signed)
Patient notified of results, new prescription, medication dose change and medication change.  She was scheduled to come in for bp check, flu shot and labs on 01/08/2023

## 2022-12-31 NOTE — Assessment & Plan Note (Signed)
She states that she is very limited by her pain and is now chair bound.  I will reach back out to Dr. Alvester Morin to see if he has any suggestions for further steps to treat her pain.

## 2022-12-31 NOTE — Assessment & Plan Note (Signed)
I would like for her to taper off of effexor (I sent new dose with instructions to her pharmacy). When she completes effexor, begin cymbalta.  Let me know if she has any issues during this transition.

## 2022-12-31 NOTE — Assessment & Plan Note (Signed)
Scheduled for EGD with GI.

## 2022-12-31 NOTE — Assessment & Plan Note (Addendum)
Recently established with a therapist Nanette Funderburk. Continue buspar.

## 2022-12-31 NOTE — Telephone Encounter (Addendum)
Please contact pt to schedule a nurse visit for flu shot BP check and lab work in 1-2 weeks.  I originally told her that we could do her shingles shot hear but I see that she has medicare so I recommend she get the shingles shot from her pharmacy  due to cost.  Since her lower bp number has been consistently elevated, I would recommend that she increase her toprol xl from 75mg  to 100mg .  I will send an rx for 100mg  tabs to her pharmacy.   She will need a 3 month in person office visit with me please for follow up .

## 2022-12-31 NOTE — Assessment & Plan Note (Addendum)
Maintained on pepcid and omeprazole. Management per GI.

## 2022-12-31 NOTE — Telephone Encounter (Signed)
Patient was informed of the documented discussion and notified of medication changes with specific instructions.

## 2023-01-01 ENCOUNTER — Ambulatory Visit: Payer: 59 | Admitting: *Deleted

## 2023-01-01 DIAGNOSIS — Z0000 Encounter for general adult medical examination without abnormal findings: Secondary | ICD-10-CM

## 2023-01-01 NOTE — Progress Notes (Signed)
Subjective:   Hailey Vance is a 57 y.o. female who presents for Medicare Annual (Subsequent) preventive examination.  Visit Complete: Virtual I connected with  Hailey Vance on 01/01/23 by a audio enabled telemedicine application and verified that I am speaking with the correct person using two identifiers.  Patient Location: Home  Provider Location: Office/Clinic  I discussed the limitations of evaluation and management by telemedicine. The patient expressed understanding and agreed to proceed.  Vital Signs: Because this visit was a virtual/telehealth visit, some criteria may be missing or patient reported. Any vitals not documented were not able to be obtained and vitals that have been documented are patient reported.   Cardiac Risk Factors include: advanced age (>32men, >78 women);obesity (BMI >30kg/m2);dyslipidemia;hypertension     Objective:    Today's Vitals   01/01/23 1301  PainSc: 8    There is no height or weight on file to calculate BMI.     01/01/2023    1:14 PM 08/05/2021    8:34 AM 10/12/2019    9:21 AM 02/15/2018   10:00 PM 02/15/2018   11:37 AM 02/11/2018    7:51 AM 09/30/2017    9:45 AM  Advanced Directives  Does Patient Have a Medical Advance Directive? No No No No No No No  Would patient like information on creating a medical advance directive? No - Patient declined  Yes (Inpatient - patient defers creating a medical advance directive at this time - Information given) No - Patient declined No - Patient declined No - Patient declined Yes (MAU/Ambulatory/Procedural Areas - Information given)    Current Medications (verified) Outpatient Encounter Medications as of 01/01/2023  Medication Sig   albuterol (VENTOLIN HFA) 108 (90 Base) MCG/ACT inhaler INHALE 2 PUFFS BY MOUTH EVERY 6 HOURS AS NEEDED FOR WHEEZING FOR SHORTNESS OF BREATH   amLODipine (NORVASC) 10 MG tablet TAKE 1 TABLET BY MOUTH EVERY DAY   Artificial Tear Ointment (DRY EYES OP) Apply to  eye as needed.   buPROPion (WELLBUTRIN XL) 300 MG 24 hr tablet TAKE 1 TABLET BY MOUTH ONCE DAILY . APPOINTMENT REQUIRED FOR FUTURE REFILLS   busPIRone (BUSPAR) 7.5 MG tablet Take 1 tablet (7.5 mg total) by mouth 2 (two) times daily.   Cholecalciferol (VITAMIN D3 GUMMIES PO) Take 2 tablets by mouth daily. Reports 5000 IU takes 2 gummies daily   cyanocobalamin (VITAMIN B12) 1000 MCG tablet Take 1 tablet (1,000 mcg total) by mouth daily.   cyanocobalamin (VITAMIN B12) 1000 MCG/ML injection Inject 1 mL (1,000 mcg total) into the muscle every 30 (thirty) days.   diclofenac Sodium (VOLTAREN) 1 % GEL Apply 2 g topically daily as needed.   DULoxetine (CYMBALTA) 30 MG capsule Take 1 cap by mouth once daily for 3 days, then increase to 2 caps once daily on day 4.   famotidine (PEPCID) 20 MG tablet Take 1 tablet (20 mg total) by mouth at bedtime.   furosemide (LASIX) 20 MG tablet Take 1 tablet (20 mg total) by mouth daily as needed for edema.   gabapentin (NEURONTIN) 300 MG capsule Take 1 capsule (300 mg total) by mouth 3 (three) times daily.   lisinopril (ZESTRIL) 30 MG tablet Take 1 tablet (30 mg total) by mouth daily.   metoprolol succinate (TOPROL-XL) 100 MG 24 hr tablet Take 1 tablet (100 mg total) by mouth daily. Take with or immediately following a meal.   multivitamin-iron-minerals-folic acid (CENTRUM) chewable tablet Chew 1 tablet by mouth daily.   NEEDLE, DISP, 25 G (  B-D DISP NEEDLE 25GX1") 25G X 1" MISC For B12 injections   nystatin (MYCOSTATIN/NYSTOP) powder Apply 1 Application topically 2 (two) times daily.   omeprazole (PRILOSEC) 40 MG capsule Take 1 capsule (40 mg total) by mouth daily. Daily 30 minutes before breakfast   SYRINGE-NEEDLE, DISP, 3 ML 23G X 1" 3 ML MISC For B12 injections   tirzepatide (MOUNJARO) 12.5 MG/0.5ML Pen Inject 12.5 mg into the skin once a week.   venlafaxine XR (EFFEXOR XR) 37.5 MG 24 hr capsule 3 tabs by mouth once daily for 1 week, then 2 tabs once daily for 1 week,  then 1 tab daily for 1 week then stop   No facility-administered encounter medications on file as of 01/01/2023.    Allergies (verified) Penicillins   History: Past Medical History:  Diagnosis Date   Bilateral primary osteoarthritis of knee 03/29/2019   Chronic back pain    Cystocele with rectocele    Generalized anxiety disorder    GERD (gastroesophageal reflux disease)    Headache    otc med prn   Heart murmur    echo in epic 11-26-2017  normal w/ ef 55-60%   History of chest pain    pt referred for atyical chest pain , cardiology evaluation w/ dr berry note in epic 10-25-2014, stated with low risk w/ no ischemia , nuclear ef 70% , done 09-19-2014 in epic,  non-cardiac chest pain   HTN (hypertension)    followed by pcp    (normal nuclear stress test 09-19-2014 in epic)   Low blood potassium    Major depressive disorder    OSA (obstructive sleep apnea)    04/2015 study in e- Severe OSA (AHI = 55.0/hour);   (10-06-2019  per pt has not used cpap since 2019 after gastric bypass)   Osteoarthritis of both hips 06/15/2019   Plantar fasciitis of left foot    S/P gastric bypass 02/15/2018   sleeve   SUI (stress urinary incontinence, female)    Vitamin D deficiency 09/06/2014   Wears glasses    Past Surgical History:  Procedure Laterality Date   BLADDER SUSPENSION N/A 10/12/2019   Procedure: TRANSVAGINAL TAPE (TVT) PROCEDURE;  Surgeon: Carrington Clamp, MD;  Location: [facility];  Service: Gynecology;  Laterality: N/A;   CESAREAN SECTION W/BTL  1990   COLONOSCOPY WITH PROPOFOL N/A 08/30/2015   Procedure: COLONOSCOPY WITH PROPOFOL;  Surgeon: Napoleon Form, MD;  Location: [facility];  Service: Endoscopy;  Laterality: N/A;   CYSTOCELE REPAIR N/A 10/12/2019   Procedure: ANTERIOR (CYSTOCELE);  Surgeon: Carrington Clamp, MD;  Location: [facility];  Service: Gynecology;  Laterality: N/A;   CYSTOSCOPY N/A 05/23/2015   Procedure: CYSTOSCOPY;  Surgeon:  Carrington Clamp, MD;  Location: [facility];  Service: Gynecology;  Laterality: N/A;   CYSTOSCOPY N/A 10/12/2019   Procedure: CYSTOSCOPY;  Surgeon: Carrington Clamp, MD;  Location: [place];  Service: Gynecology;  Laterality: N/A;   HIATAL HERNIA REPAIR N/A 02/15/2018   Procedure: HERNIA REPAIR HIATAL;  Surgeon: Glenna Fellows, MD;  Location: [facility];  Service: General;  Laterality: N/A;   HYSTEROSCOPY WITH D & C N/A 01/11/2015   Procedure: DILATATION AND CURETTAGE /HYSTEROSCOPY;  Surgeon: Marlow Baars, MD;  Location: [facility];  Service: Gynecology;  Laterality: N/A;   KNEE ARTHROSCOPY Right 2010   LAPAROSCOPIC GASTRIC SLEEVE RESECTION N/A 02/15/2018   Procedure: LAPAROSCOPIC GASTRIC SLEEVE RESECTION WITH UPPER ENDO AND ERAS PATHWAY;  Surgeon: Glenna Fellows, MD;  Location: [facility];  Service: General;  Laterality: N/A;   LAPAROSCOPIC OOPHORECTOMY Bilateral 2004   LEFT OOPHORECTOMY AND PARTIAL RIGHT OOPHORECTOMY   LIPOMA EXCISION Left 12/02/2016   Procedure: EXCISION LIPOMA OF LEFT SHOULDER;  Surgeon: Valeria Batman, MD;  Location: [facility];  Service: Orthopedics;  Laterality: Left;   ROBOTIC ASSISTED TOTAL HYSTERECTOMY WITH SALPINGECTOMY Bilateral 05/23/2015   Procedure: ROBOTIC ASSISTED TOTAL HYSTERECTOMY WITH SALPINGECTOMYwith right oophorectomy;  Surgeon: Carrington Clamp, MD;  Location: [facility];  Service: Gynecology;  Laterality: Bilateral;   UMBILICAL HERNIA REPAIR  2002   Family History  Problem Relation Age of Onset   Lung cancer Mother 83       dec   Cancer Mother    Hypertension Mother    Arthritis Mother    Cancer Father 23       prostate   Hypertension Father    Leukemia Sister    Hypertension Sister    Arthritis Sister    Hyperlipidemia Sister    Hypertension Brother    Arthritis Brother    Multiple sclerosis Sister    Hypertension Sister    Hypertension Sister    Hypertension Brother    Hypertension Brother    Breast cancer Paternal Aunt     Alzheimer's disease Paternal Aunt    Diabetes Maternal Grandmother    Stroke Maternal Grandmother    Heart attack Paternal Grandmother    Asthma Other    Social History   Socioeconomic History   Marital status: Divorced    Spouse name: Not on file   Number of children: Not on file   Years of education: 12   Highest education level: High school graduate  Occupational History   Occupation: Disability  Tobacco Use   Smoking status: Never   Smokeless tobacco: Never  Vaping Use   Vaping status: Never Used  Substance and Sexual Activity   Alcohol use: No    Alcohol/week: 0.0 standard drinks of alcohol   Drug use: Never   Sexual activity: Not on file    Comment: BTL w/ C/S in 1990  Other Topics Concern   Not on file  Social History Narrative   Divorced   Lives with son and grand-daughter   37- Son Dayton Scrape (lives in [place])   1308- Son Harriett Sine lives with patient   9- Daughter Shanda Bumps (lives in [place])   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   Completed some college   Geophysical data processor   Social Determinants of Health   Financial Resource Strain: Low Risk  (01/01/2023)   Overall Financial Resource Strain (CARDIA)    Difficulty of Paying Living Expenses: Not very hard  Food Insecurity: No Food Insecurity (05/29/2022)   Hunger Vital Sign    Worried About Running Out of Food in the Last Year: Never true    Ran Out of Food in the Last Year: Never true  Transportation Needs: No Transportation Needs (05/29/2022)   PRAPARE - Administrator, Civil Service (Medical): No    Lack of Transportation (Non-Medical): No  Physical Activity: Inactive (01/01/2023)   Exercise Vital Sign    Days of Exercise per Week: 0 days    Minutes of Exercise per Session: 0 min  Stress: No Stress Concern Present (01/01/2023)   [employer] of Occupational Health - Occupational Stress Questionnaire    Feeling of Stress : Only a little  Social Connections: Socially Isolated  (01/01/2023)   Social Connection and Isolation Panel [NHANES]    Frequency of Communication with  Friends and Family: Three times a week    Frequency of Social Gatherings with Friends and Family: Once a week    Attends Religious Services: Never    Database administrator or Organizations: No    Attends Engineer, structural: Never    Marital Status: Divorced    Tobacco Counseling Counseling given: Not Answered   Clinical Intake:  Pre-visit preparation completed: Yes  Pain : 0-10 Pain Score: 8  Pain Type: Acute pain Pain Location: Back Pain Orientation: Lower Pain Descriptors / Indicators: Aching Pain Onset: Yesterday Pain Frequency: Occasional  BMI - recorded: 47.1 Nutritional Status: BMI > 30  Obese Nutritional Risks: None Diabetes: No  How often do you need to have someone help you when you read instructions, pamphlets, or other written materials from your doctor or pharmacy?: 1 - Never  Interpreter Needed?: No  Information entered by :: Donne Anon, CMA   Activities of Daily Living    01/01/2023    1:03 PM  In your present state of health, do you have any difficulty performing the following activities:  Hearing? 0  Vision? 0  Difficulty concentrating or making decisions? 1  Comment remembering  Walking or climbing stairs? 1  Dressing or bathing? 0  Doing errands, shopping? 0  Preparing Food and eating ? N  Using the Toilet? N  In the past six months, have you accidently leaked urine? Y  Do you have problems with loss of bowel control? N  Managing your Medications? N  Managing your Finances? N  Housekeeping or managing your Housekeeping? N    Patient Care Team: Sandford Craze, NP as PCP - General (Internal Medicine) Donalee Citrin, MD as Consulting Physician (Neurosurgery) Patton Salles, MD as Consulting Physician (Obstetrics and Gynecology) Colletta Maryland, RN as [facility] Care Management  Indicate any recent  Medical Services you may have received from other than [facility] providers in the past year (date may be approximate).     Assessment:   This is a routine wellness examination for Tazanna.  Hearing/Vision screen No results found.   Goals Addressed   None    Depression Screen    01/01/2023    1:20 PM 06/04/2022    2:35 PM 08/05/2021    8:37 AM 11/28/2020   12:17 PM 08/22/2020   12:58 PM 03/20/2020    1:46 PM 05/03/2019    1:14 PM  PHQ 2/9 Scores  PHQ - 2 Score 2 5 1 2 3  0 4  PHQ- 9 Score  16  8 12  0 13    Fall Risk    01/01/2023    1:10 PM 08/05/2021    8:35 AM 08/22/2020   11:33 AM 03/02/2018    9:41 AM 01/26/2018   12:00 PM  Fall Risk   Falls in the past year? 1 1 1  0 0  Number falls in past yr: 1 0 0    Injury with Fall? 1 0 1    Risk for fall due to : History of fall(s);Impaired balance/gait      Follow up Falls evaluation completed;Falls prevention discussed Falls prevention discussed       MEDICARE RISK AT HOME: Medicare Risk at Home Any stairs in or around the home?: Yes If so, are there any without handrails?: No Home free of loose throw rugs in walkways, pet beds, electrical cords, etc?: Yes Adequate lighting in your home to reduce risk of falls?: No Life alert?: No Use of  a cane, walker or w/c?: Yes Grab bars in the bathroom?: No Shower chair or bench in shower?: Yes Elevated toilet seat or a handicapped toilet?: No  TIMED UP AND GO:  Was the test performed?  No    Cognitive Function:        01/01/2023    1:22 PM  6CIT Screen  What Year? 0 points  What month? 0 points  What time? 0 points  Count back from 20 0 points  Months in reverse 0 points  Repeat phrase 0 points  Total Score 0 points    Immunizations Immunization History  Administered Date(s) Administered   Influenza-Unspecified 12/28/2014   PFIZER Comirnaty(Gray Top)Covid-19 Tri-Sucrose Vaccine 08/22/2020   Tdap 10/10/2014    TDAP status: Up to date  Flu Vaccine status: Due, Education  has been provided regarding the importance of this vaccine. Advised may receive this vaccine at local pharmacy or Health Dept. Aware to provide a copy of the vaccination record if obtained from local pharmacy or Health Dept. Verbalized acceptance and understanding.  Pneumococcal vaccine status: Due, Education has been provided regarding the importance of this vaccine. Advised may receive this vaccine at local pharmacy or Health Dept. Aware to provide a copy of the vaccination record if obtained from local pharmacy or Health Dept. Verbalized acceptance and understanding.  Covid-19 vaccine status: Information provided on how to obtain vaccines.   Qualifies for Shingles Vaccine? Yes   Zostavax completed No   Shingrix Completed?: No.    Education has been provided regarding the importance of this vaccine. Patient has been advised to call insurance company to determine out of pocket expense if they have not yet received this vaccine. Advised may also receive vaccine at local pharmacy or Health Dept. Verbalized acceptance and understanding.  Screening Tests Health Maintenance  Topic Date Due   Zoster Vaccines- Shingrix (1 of 2) Never done   COVID-19 Vaccine (2 - Pfizer risk series) 09/12/2020   MAMMOGRAM  05/19/2022   Medicare Annual Wellness (AWV)  08/06/2022   INFLUENZA VACCINE  06/01/2023 (Originally 10/02/2022)   DTaP/Tdap/Td (2 - Td or Tdap) 10/09/2024   Colonoscopy  08/29/2025   Hepatitis C Screening  Completed   HIV Screening  Completed   HPV VACCINES  Aged Out    Health Maintenance  Health Maintenance Due  Topic Date Due   Zoster Vaccines- Shingrix (1 of 2) Never done   COVID-19 Vaccine (2 - Pfizer risk series) 09/12/2020   MAMMOGRAM  05/19/2022   Medicare Annual Wellness (AWV)  08/06/2022    Colorectal cancer screening: Type of screening: Colonoscopy. Completed 09/29/15. Repeat every 10 years  Mammogram status: Completed 05/18/20. Repeat every year. Pt owes a bill but will  schedule mammogram once that bill is paid.  Bone Density status: due at age 63  Lung Cancer Screening: (Low Dose CT Chest recommended if Age 97-80 years, 20 pack-year currently smoking OR have quit w/in 15years.) does not qualify.   Additional Screening:  Hepatitis C Screening: does qualify; Completed 05/02/15  Vision Screening: Recommended annual ophthalmology exams for early detection of glaucoma and other disorders of the eye. Is the patient up to date with their annual eye exam?  Yes  Who is the provider or what is the name of the office in which the patient attends annual eye exams? Can't remember name at this time but will be going to someone different next year If pt is not established with a provider, would they like to be referred to  a provider to establish care? No .   Dental Screening: Recommended annual dental exams for proper oral hygiene  Diabetic Foot Exam: N/a  Community Resource Referral / Chronic Care Management: CRR required this visit?  No   CCM required this visit?  No     Plan:     I have personally reviewed and noted the following in the patient's chart:   Medical and social history Use of alcohol, tobacco or illicit drugs  Current medications and supplements including opioid prescriptions. Patient is not currently taking opioid prescriptions. Functional ability and status Nutritional status Physical activity Advanced directives List of other physicians Hospitalizations, surgeries, and ER visits in previous 12 months Vitals Screenings to include cognitive, depression, and falls Referrals and appointments  In addition, I have reviewed and discussed with patient certain preventive protocols, quality metrics, and best practice recommendations. A written personalized care plan for preventive services as well as general preventive health recommendations were provided to patient.     Donne Anon, CMA   01/01/2023   After Visit Summary: (MyChart) Due  to this being a telephonic visit, the after visit summary with patients personalized plan was offered to patient via MyChart   Nurse Notes: None

## 2023-01-01 NOTE — Patient Instructions (Signed)
Hailey Vance , Thank you for taking time to come for your Medicare Wellness Visit. I appreciate your ongoing commitment to your health goals. Please review the following plan we discussed and let me know if I can assist you in the future.     This is a list of the screening recommended for you and due dates:  Health Maintenance  Topic Date Due   Zoster (Shingles) Vaccine (1 of 2) Never done   COVID-19 Vaccine (2 - Pfizer risk series) 09/12/2020   Mammogram  05/19/2022   Flu Shot  06/01/2023*   Medicare Annual Wellness Visit  01/01/2024   DTaP/Tdap/Td vaccine (2 - Td or Tdap) 10/09/2024   Colon Cancer Screening  08/29/2025   Hepatitis C Screening  Completed   HIV Screening  Completed   HPV Vaccine  Aged Out  *Topic was postponed. The date shown is not the original due date.     Next appointment: Follow up in one year for your annual wellness visit.   Preventive Care 40-64 Years, Female Preventive care refers to lifestyle choices and visits with your health care provider that can promote health and wellness. What does preventive care include? A yearly physical exam. This is also called an annual well check. Dental exams once or twice a year. Routine eye exams. Ask your health care provider how often you should have your eyes checked. Personal lifestyle choices, including: Daily care of your teeth and gums. Regular physical activity. Eating a healthy diet. Avoiding tobacco and drug use. Limiting alcohol use. Practicing safe sex. Taking low-dose aspirin daily starting at age 56. Taking vitamin and mineral supplements as recommended by your health care provider. What happens during an annual well check? The services and screenings done by your health care provider during your annual well check will depend on your age, overall health, lifestyle risk factors, and family history of disease. Counseling  Your health care provider may ask you questions about your: Alcohol use. Tobacco  use. Drug use. Emotional well-being. Home and relationship well-being. Sexual activity. Eating habits. Work and work Astronomer. Method of birth control. Menstrual cycle. Pregnancy history. Screening  You may have the following tests or measurements: Height, weight, and BMI. Blood pressure. Lipid and cholesterol levels. These may be checked every 5 years, or more frequently if you are over 59 years old. Skin check. Lung cancer screening. You may have this screening every year starting at age 2 if you have a 30-pack-year history of smoking and currently smoke or have quit within the past 15 years. Fecal occult blood test (FOBT) of the stool. You may have this test every year starting at age 41. Flexible sigmoidoscopy or colonoscopy. You may have a sigmoidoscopy every 5 years or a colonoscopy every 10 years starting at age 46. Hepatitis C blood test. Hepatitis B blood test. Sexually transmitted disease (STD) testing. Diabetes screening. This is done by checking your blood sugar (glucose) after you have not eaten for a while (fasting). You may have this done every 1-3 years. Mammogram. This may be done every 1-2 years. Talk to your health care provider about when you should start having regular mammograms. This may depend on whether you have a family history of breast cancer. BRCA-related cancer screening. This may be done if you have a family history of breast, ovarian, tubal, or peritoneal cancers. Pelvic exam and Pap test. This may be done every 3 years starting at age 62. Starting at age 24, this may be done every 5  years if you have a Pap test in combination with an HPV test. Bone density scan. This is done to screen for osteoporosis. You may have this scan if you are at high risk for osteoporosis. Discuss your test results, treatment options, and if necessary, the need for more tests with your health care provider. Vaccines  Your health care provider may recommend certain vaccines,  such as: Influenza vaccine. This is recommended every year. Tetanus, diphtheria, and acellular pertussis (Tdap, Td) vaccine. You may need a Td booster every 10 years. Zoster vaccine. You may need this after age 23. Pneumococcal 13-valent conjugate (PCV13) vaccine. You may need this if you have certain conditions and were not previously vaccinated. Pneumococcal polysaccharide (PPSV23) vaccine. You may need one or two doses if you smoke cigarettes or if you have certain conditions. Talk to your health care provider about which screenings and vaccines you need and how often you need them. This information is not intended to replace advice given to you by your health care provider. Make sure you discuss any questions you have with your health care provider. Document Released: 03/16/2015 Document Revised: 11/07/2015 Document Reviewed: 12/19/2014 Elsevier Interactive Patient Education  2017 [employer].    Fall Prevention in the Home Falls can cause injuries. They can happen to people of all ages. There are many things you can do to make your home safe and to help prevent falls. What can I do on the outside of my home? Regularly fix the edges of walkways and driveways and fix any cracks. Remove anything that might make you trip as you walk through a door, such as a raised step or threshold. Trim any bushes or trees on the path to your home. Use bright outdoor lighting. Clear any walking paths of anything that might make someone trip, such as rocks or tools. Regularly check to see if handrails are loose or broken. Make sure that both sides of any steps have handrails. Any raised decks and porches should have guardrails on the edges. Have any leaves, snow, or ice cleared regularly. Use sand or salt on walking paths during winter. Clean up any spills in your garage right away. This includes oil or grease spills. What can I do in the bathroom? Use night lights. Install grab bars by the toilet and  in the tub and shower. Do not use towel bars as grab bars. Use non-skid mats or decals in the tub or shower. If you need to sit down in the shower, use a plastic, non-slip stool. Keep the floor dry. Clean up any water that spills on the floor as soon as it happens. Remove soap buildup in the tub or shower regularly. Attach bath mats securely with double-sided non-slip rug tape. Do not have throw rugs and other things on the floor that can make you trip. What can I do in the bedroom? Use night lights. Make sure that you have a light by your bed that is easy to reach. Do not use any sheets or blankets that are too big for your bed. They should not hang down onto the floor. Have a firm chair that has side arms. You can use this for support while you get dressed. Do not have throw rugs and other things on the floor that can make you trip. What can I do in the kitchen? Clean up any spills right away. Avoid walking on wet floors. Keep items that you use a lot in easy-to-reach places. If you need to reach  something above you, use a strong step stool that has a grab bar. Keep electrical cords out of the way. Do not use floor polish or wax that makes floors slippery. If you must use wax, use non-skid floor wax. Do not have throw rugs and other things on the floor that can make you trip. What can I do with my stairs? Do not leave any items on the stairs. Make sure that there are handrails on both sides of the stairs and use them. Fix handrails that are broken or loose. Make sure that handrails are as long as the stairways. Check any carpeting to make sure that it is firmly attached to the stairs. Fix any carpet that is loose or worn. Avoid having throw rugs at the top or bottom of the stairs. If you do have throw rugs, attach them to the floor with carpet tape. Make sure that you have a light switch at the top of the stairs and the bottom of the stairs. If you do not have them, ask someone to add  them for you. What else can I do to help prevent falls? Wear shoes that: Do not have high heels. Have rubber bottoms. Are comfortable and fit you well. Are closed at the toe. Do not wear sandals. If you use a stepladder: Make sure that it is fully opened. Do not climb a closed stepladder. Make sure that both sides of the stepladder are locked into place. Ask someone to hold it for you, if possible. Clearly mark and make sure that you can see: Any grab bars or handrails. First and last steps. Where the edge of each step is. Use tools that help you move around (mobility aids) if they are needed. These include: Canes. Walkers. Scooters. Crutches. Turn on the lights when you go into a dark area. Replace any light bulbs as soon as they burn out. Set up your furniture so you have a clear path. Avoid moving your furniture around. If any of your floors are uneven, fix them. If there are any pets around you, be aware of where they are. Review your medicines with your doctor. Some medicines can make you feel dizzy. This can increase your chance of falling. Ask your doctor what other things that you can do to help prevent falls. This information is not intended to replace advice given to you by your health care provider. Make sure you discuss any questions you have with your health care provider. Document Released: 12/14/2008 Document Revised: 07/26/2015 Document Reviewed: 03/24/2014 Elsevier Interactive Patient Education  2017 [employer].

## 2023-01-08 ENCOUNTER — Ambulatory Visit: Payer: 59

## 2023-01-08 ENCOUNTER — Other Ambulatory Visit: Payer: 59

## 2023-01-14 ENCOUNTER — Encounter: Payer: Self-pay | Admitting: Gastroenterology

## 2023-01-22 DIAGNOSIS — G4733 Obstructive sleep apnea (adult) (pediatric): Secondary | ICD-10-CM | POA: Diagnosis not present

## 2023-01-27 DIAGNOSIS — Z0000 Encounter for general adult medical examination without abnormal findings: Secondary | ICD-10-CM | POA: Diagnosis not present

## 2023-01-27 DIAGNOSIS — Z79899 Other long term (current) drug therapy: Secondary | ICD-10-CM | POA: Diagnosis not present

## 2023-01-27 DIAGNOSIS — M25552 Pain in left hip: Secondary | ICD-10-CM | POA: Diagnosis not present

## 2023-01-27 DIAGNOSIS — G8929 Other chronic pain: Secondary | ICD-10-CM | POA: Diagnosis not present

## 2023-01-27 DIAGNOSIS — R5383 Other fatigue: Secondary | ICD-10-CM | POA: Diagnosis not present

## 2023-01-27 DIAGNOSIS — Z131 Encounter for screening for diabetes mellitus: Secondary | ICD-10-CM | POA: Diagnosis not present

## 2023-01-27 DIAGNOSIS — M129 Arthropathy, unspecified: Secondary | ICD-10-CM | POA: Diagnosis not present

## 2023-01-27 DIAGNOSIS — M79605 Pain in left leg: Secondary | ICD-10-CM | POA: Diagnosis not present

## 2023-01-27 DIAGNOSIS — Z1159 Encounter for screening for other viral diseases: Secondary | ICD-10-CM | POA: Diagnosis not present

## 2023-01-27 DIAGNOSIS — M25551 Pain in right hip: Secondary | ICD-10-CM | POA: Diagnosis not present

## 2023-01-27 DIAGNOSIS — M25561 Pain in right knee: Secondary | ICD-10-CM | POA: Diagnosis not present

## 2023-01-27 DIAGNOSIS — E559 Vitamin D deficiency, unspecified: Secondary | ICD-10-CM | POA: Diagnosis not present

## 2023-01-27 DIAGNOSIS — M25562 Pain in left knee: Secondary | ICD-10-CM | POA: Diagnosis not present

## 2023-01-27 DIAGNOSIS — Z0189 Encounter for other specified special examinations: Secondary | ICD-10-CM | POA: Diagnosis not present

## 2023-02-01 ENCOUNTER — Encounter: Payer: Self-pay | Admitting: Gastroenterology

## 2023-02-01 ENCOUNTER — Encounter: Payer: Self-pay | Admitting: Family

## 2023-02-02 DIAGNOSIS — M1610 Unilateral primary osteoarthritis, unspecified hip: Secondary | ICD-10-CM | POA: Diagnosis not present

## 2023-02-02 DIAGNOSIS — M170 Bilateral primary osteoarthritis of knee: Secondary | ICD-10-CM | POA: Diagnosis not present

## 2023-02-02 DIAGNOSIS — Z79899 Other long term (current) drug therapy: Secondary | ICD-10-CM | POA: Diagnosis not present

## 2023-02-03 ENCOUNTER — Encounter: Payer: 59 | Admitting: Gastroenterology

## 2023-02-04 ENCOUNTER — Ambulatory Visit: Payer: 59 | Admitting: Gastroenterology

## 2023-02-05 ENCOUNTER — Ambulatory Visit: Payer: 59 | Admitting: Physician Assistant

## 2023-02-05 DIAGNOSIS — Z79899 Other long term (current) drug therapy: Secondary | ICD-10-CM | POA: Diagnosis not present

## 2023-02-13 ENCOUNTER — Ambulatory Visit: Payer: 59 | Admitting: Physician Assistant

## 2023-02-16 ENCOUNTER — Telehealth: Payer: Self-pay | Admitting: Physician Assistant

## 2023-02-16 NOTE — Telephone Encounter (Signed)
Called pt and was unable to leave a voicemail for no vm. Provider will not be office this week. Please reschedule pt for next week starting 12/23.

## 2023-02-17 ENCOUNTER — Ambulatory Visit (INDEPENDENT_AMBULATORY_CARE_PROVIDER_SITE_OTHER): Payer: 59 | Admitting: Orthopaedic Surgery

## 2023-02-17 ENCOUNTER — Other Ambulatory Visit (INDEPENDENT_AMBULATORY_CARE_PROVIDER_SITE_OTHER): Payer: 59

## 2023-02-17 ENCOUNTER — Ambulatory Visit: Payer: 59 | Admitting: Physician Assistant

## 2023-02-17 VITALS — Ht 63.0 in | Wt 266.4 lb

## 2023-02-17 DIAGNOSIS — Z6841 Body Mass Index (BMI) 40.0 and over, adult: Secondary | ICD-10-CM

## 2023-02-17 DIAGNOSIS — M1711 Unilateral primary osteoarthritis, right knee: Secondary | ICD-10-CM | POA: Diagnosis not present

## 2023-02-17 DIAGNOSIS — M1712 Unilateral primary osteoarthritis, left knee: Secondary | ICD-10-CM

## 2023-02-17 DIAGNOSIS — M170 Bilateral primary osteoarthritis of knee: Secondary | ICD-10-CM

## 2023-02-17 MED ORDER — BUPIVACAINE HCL 0.5 % IJ SOLN
2.0000 mL | INTRAMUSCULAR | Status: AC | PRN
  Administered 2023-02-17: 2 mL via INTRA_ARTICULAR

## 2023-02-17 MED ORDER — METHYLPREDNISOLONE ACETATE 40 MG/ML IJ SUSP
40.0000 mg | INTRAMUSCULAR | Status: AC | PRN
  Administered 2023-02-17: 40 mg via INTRA_ARTICULAR

## 2023-02-17 MED ORDER — LIDOCAINE HCL 1 % IJ SOLN
2.0000 mL | INTRAMUSCULAR | Status: AC | PRN
  Administered 2023-02-17: 2 mL

## 2023-02-17 NOTE — Progress Notes (Signed)
Office Visit Note   Patient: Hailey Vance           Date of Birth: 1965/06/22           MRN: [id number] Visit Date: 02/17/2023              Requested by: Sandford Craze, NP [address] [place],  [place] [address] PCP: Sandford Craze, NP   Assessment & Plan: Visit Diagnoses:  1. Bilateral primary osteoarthritis of knee   2. Body mass index 45.0-49.9, adult [facility])     Plan: Impression is 57 year old female with bilateral knee DJD.  Both knees injected with cortisone today.  Patient will continue to make all efforts at weight loss.  Goal weight is 225 pounds based on a height of 5 foot 3 inches.  She tolerated the injections well.  The patient meets the AMA guidelines for Morbid (severe) obesity with a BMI > 40.0 and I have recommended weight loss.  Total face to face encounter time was greater than 25 minutes and over half of this time was spent in counseling and/or coordination of care.  Follow-Up Instructions: No follow-ups on file.   Orders:  Orders Placed This Encounter  Procedures   Large Joint Inj   XR KNEE 3 VIEW LEFT   XR KNEE 3 VIEW RIGHT   No orders of the defined types were placed in this encounter.     Procedures: Large Joint Inj: bilateral knee on 02/17/2023 9:24 AM Indications: pain Details: 22 G needle, ultrasound-guided  Arthrogram: No  Medications (Right): 2 mL lidocaine 1 %; 2 mL bupivacaine 0.5 %; 40 mg methylPREDNISolone acetate 40 MG/ML Medications (Left): 2 mL lidocaine 1 %; 2 mL bupivacaine 0.5 %; 40 mg methylPREDNISolone acetate 40 MG/ML Outcome: tolerated well, no immediate complications Patient was prepped and draped in the usual sterile fashion.       Clinical Data: No additional findings.   Subjective: Chief Complaint  Patient presents with   Right Knee - Pain   Left Knee - Pain    HPI Hailey Vance returns today for follow-up evaluation of bilateral knee pain.  Previous patient of Dr. Cleophas Dunker.  She  has had numerous cortisone injections for both knees for advanced DJD. Review of Systems  Constitutional: Negative.   HENT: Negative.    Eyes: Negative.   Respiratory: Negative.    Cardiovascular: Negative.   Endocrine: Negative.   Musculoskeletal:  Positive for arthralgias and gait problem.  Hematological: Negative.   Psychiatric/Behavioral: Negative.    All other systems reviewed and are negative.    Objective: Vital Signs: Ht 5\' 3"  (1.6 m)   Wt 266 lb 6.4 oz (120.8 kg)   LMP 10/02/2007   BMI 47.19 kg/m   Physical Exam Vitals and nursing note reviewed.  Constitutional:      Appearance: She is well-developed.  HENT:     Head: Normocephalic and atraumatic.  Pulmonary:     Effort: Pulmonary effort is normal.  Abdominal:     Palpations: Abdomen is soft.  Musculoskeletal:     Cervical back: Neck supple.  Skin:    General: Skin is warm.     Capillary Refill: Capillary refill takes less than 2 seconds.  Neurological:     Mental Status: She is alert and oriented to person, place, and time.  Psychiatric:        Behavior: Behavior normal.        Thought Content: Thought content normal.  Judgment: Judgment normal.     Ortho Exam  Specialty Comments:  CLINICAL DATA:  Lumbar radiculopathy. Bilateral leg weakness. Lumbar radiculopathy, symptoms persist with greater than 6 weeks treatment. Lumbar radiculopathy and bilateral lower extremity weakness. Additional history provided by scanning technologist: Patient reports lower extremity weakness and ongoing radicular symptoms.   EXAM: MRI LUMBAR SPINE WITHOUT CONTRAST   TECHNIQUE: Multiplanar, multisequence MR imaging of the lumbar spine was performed. No intravenous contrast was administered.   COMPARISON:  Prior lumbar spine radiographs 08/22/2020 and earlier. Lumbar spine MRI 06/29/2013.   FINDINGS: Segmentation: 5 lumbar vertebrae. The caudal most well-formed intervertebral disc space is designated  L5-S1.   Alignment: Trace L4-L5 grade 1 anterolisthesis, new from the prior MRI.   Vertebrae: Vertebral body height is maintained. No significant marrow edema or focal suspicious osseous lesion.   Conus medullaris and cauda equina: Conus extends to the L2 level. No signal abnormality within the visualized distal spinal cord.   Paraspinal and other soft tissues: Multiple large bilateral renal cysts, incompletely imaged and incompletely assessed. Paraspinal soft tissues unremarkable.   Disc levels:   Unless otherwise stated, the level by level findings below have not significantly changed from the prior MRI of 06/29/2013.   Mild disc degeneration at L3-L4, L4-L5 and L5-S1, slightly progressed.   T11-T12: This level is imaged in the sagittal plane only. Slight disc bulge. Mild facet arthrosis and ligamentum flavum hypertrophy. No significant spinal canal or foraminal stenosis.   T12-L1: Slight disc bulge. Mild facet arthrosis. No significant spinal canal or foraminal stenosis.   L1-L2: Minimal facet and ligamentum flavum hypertrophy. No significant disc herniation or stenosis.   L2-L3: Mild facet arthrosis and ligamentum flavum hypertrophy. No significant disc herniation or stenosis.   L3-L4: Small disc bulge. Progressive moderate facet arthrosis with ligamentum flavum hypertrophy. Progressive mild epidural lipomatosis. Progressive mild narrowing of the spinal canal. No significant foraminal stenosis.   L4-L5: Trace grade 1 anterolisthesis, new from the prior exam. Slight disc uncovering. Moderate bilateral facet arthrosis with ligamentum flavum hypertrophy. Mild epidural lipomatosis. No significant spinal canal stenosis. Minimal relative narrowing of the left neural foramen.   L5-S1: Mild-to-moderate facet arthrosis with ligamentum flavum hypertrophy. No significant disc herniation or stenosis.   IMPRESSION: Lumbar and lower thoracic spondylosis, and mild  epidural lipomatosis, as described and slightly progressed at multiple levels since the prior MRI of 06/29/2013. No more than mild spinal canal or neural foraminal narrowing. Mild multilevel disc degeneration, greatest at L3-L4, L3-L4 and L4-L5.   Trace L4-L5 grade 1 anterolisthesis, new from the prior MRI.   Multiple incompletely imaged large bilateral renal cysts.     Electronically Signed   By: Jackey Loge D.O.   On: 06/19/2021 09:48  Imaging: XR KNEE 3 VIEW RIGHT Result Date: 02/17/2023 X-rays demonstrate severe osteoarthritis.  Bone-on-bone joint space narrowing.  XR KNEE 3 VIEW LEFT Result Date: 02/17/2023 X-rays of the left knee show advanced tricompartmental degenerative joint disease.  Bone-on-bone joint space narrowing.    PMFS History: Patient Active Problem List   Diagnosis Date Noted   Fibromyalgia 12/31/2022   Gastroesophageal reflux disease 05/14/2022   History of kidney stones 05/14/2022   Renal cyst 06/26/2021   Left shoulder pain 09/12/2020   Primary osteoarthritis, left shoulder 09/11/2020   Dysphagia 08/23/2020   B12 deficiency 08/22/2020   Hyperlipidemia 08/22/2020   Moderate episode of recurrent major depressive disorder (HCC) 08/22/2020   Major depressive disorder    Osteoarthritis of both hips 06/15/2019  Osteoarthritis of knees, bilateral 03/29/2019   Morbid obesity (HCC) 02/15/2018   Lipoma of left shoulder 12/02/2016   BMI 45.0-49.9, adult [facility])    Preventative health care 10/10/2014   Essential hypertension 09/06/2014   Osteoarthritis 09/06/2014   Post-menopausal bleeding 09/06/2014   OSA (obstructive sleep apnea) 09/06/2014   Generalized anxiety disorder 09/06/2014   Vitamin D deficiency 09/06/2014   Chronic back pain    Past Medical History:  Diagnosis Date   Bilateral primary osteoarthritis of knee 03/29/2019   Chronic back pain    Cystocele with rectocele    Generalized anxiety disorder    GERD (gastroesophageal reflux  disease)    Headache    otc med prn   Heart murmur    echo in epic 11-26-2017  normal w/ ef 55-60%   History of chest pain    pt referred for atyical chest pain , cardiology evaluation w/ dr berry note in epic 10-25-2014, stated with low risk w/ no ischemia , nuclear ef 70% , done 09-19-2014 in epic,  non-cardiac chest pain   HTN (hypertension)    followed by pcp    (normal nuclear stress test 09-19-2014 in epic)   Low blood potassium    Major depressive disorder    OSA (obstructive sleep apnea)    04/2015 study in e- Severe OSA (AHI = 55.0/hour);   (10-06-2019  per pt has not used cpap since 2019 after gastric bypass)   Osteoarthritis of both hips 06/15/2019   Plantar fasciitis of left foot    S/P gastric bypass 02/15/2018   sleeve   SUI (stress urinary incontinence, female)    Vitamin D deficiency 09/06/2014   Wears glasses     Family History  Problem Relation Age of Onset   Lung cancer Mother 5       dec   Cancer Mother    Hypertension Mother    Arthritis Mother    Cancer Father 24       prostate   Hypertension Father    Leukemia Sister    Hypertension Sister    Arthritis Sister    Hyperlipidemia Sister    Hypertension Brother    Arthritis Brother    Multiple sclerosis Sister    Hypertension Sister    Hypertension Sister    Hypertension Brother    Hypertension Brother    Breast cancer Paternal Aunt    Alzheimer's disease Paternal Aunt    Diabetes Maternal Grandmother    Stroke Maternal Grandmother    Heart attack Paternal Grandmother    Asthma Other     Past Surgical History:  Procedure Laterality Date   BLADDER SUSPENSION N/A 10/12/2019   Procedure: TRANSVAGINAL TAPE (TVT) PROCEDURE;  Surgeon: Carrington Clamp, MD;  Location: [facility] [place];  Service: Gynecology;  Laterality: N/A;   CESAREAN SECTION W/BTL  1990   COLONOSCOPY WITH PROPOFOL N/A 08/30/2015   Procedure: COLONOSCOPY WITH PROPOFOL;  Surgeon: Napoleon Form, MD;  Location: [facility]  ENDOSCOPY;  Service: Endoscopy;  Laterality: N/A;   CYSTOCELE REPAIR N/A 10/12/2019   Procedure: ANTERIOR (CYSTOCELE);  Surgeon: Carrington Clamp, MD;  Location: [facility];  Service: Gynecology;  Laterality: N/A;   CYSTOSCOPY N/A 05/23/2015   Procedure: CYSTOSCOPY;  Surgeon: Carrington Clamp, MD;  Location: [facility];  Service: Gynecology;  Laterality: N/A;   CYSTOSCOPY N/A 10/12/2019   Procedure: CYSTOSCOPY;  Surgeon: Carrington Clamp, MD;  Location: [facility];  Service: Gynecology;  Laterality: N/A;   HIATAL HERNIA REPAIR  N/A 02/15/2018   Procedure: HERNIA REPAIR HIATAL;  Surgeon: Glenna Fellows, MD;  Location: [facility];  Service: General;  Laterality: N/A;   HYSTEROSCOPY WITH D & C N/A 01/11/2015   Procedure: DILATATION AND CURETTAGE /HYSTEROSCOPY;  Surgeon: Marlow Baars, MD;  Location: [facility];  Service: Gynecology;  Laterality: N/A;   KNEE ARTHROSCOPY Right 2010   LAPAROSCOPIC GASTRIC SLEEVE RESECTION N/A 02/15/2018   Procedure: LAPAROSCOPIC GASTRIC SLEEVE RESECTION WITH UPPER ENDO AND ERAS PATHWAY;  Surgeon: Glenna Fellows, MD;  Location: [facility];  Service: General;  Laterality: N/A;   LAPAROSCOPIC OOPHORECTOMY Bilateral 2004   LEFT OOPHORECTOMY AND PARTIAL RIGHT OOPHORECTOMY   LIPOMA EXCISION Left 12/02/2016   Procedure: EXCISION LIPOMA OF LEFT SHOULDER;  Surgeon: Valeria Batman, MD;  Location: [facility];  Service: Orthopedics;  Laterality: Left;   ROBOTIC ASSISTED TOTAL HYSTERECTOMY WITH SALPINGECTOMY Bilateral 05/23/2015   Procedure: ROBOTIC ASSISTED TOTAL HYSTERECTOMY WITH SALPINGECTOMYwith right oophorectomy;  Surgeon: Carrington Clamp, MD;  Location: [facility];  Service: Gynecology;  Laterality: Bilateral;   UMBILICAL HERNIA REPAIR  2002   Social History   Occupational History   Occupation: Disability  Tobacco Use   Smoking status: Never   Smokeless tobacco: Never  Vaping Use   Vaping status: Never Used  Substance and Sexual Activity   Alcohol  use: No    Alcohol/week: 0.0 standard drinks of alcohol   Drug use: Never   Sexual activity: Not on file    Comment: BTL w/ C/S in 1990

## 2023-02-20 ENCOUNTER — Ambulatory Visit: Payer: 59 | Admitting: Family

## 2023-02-21 DIAGNOSIS — G4733 Obstructive sleep apnea (adult) (pediatric): Secondary | ICD-10-CM | POA: Diagnosis not present

## 2023-02-23 ENCOUNTER — Telehealth: Payer: Self-pay | Admitting: Family

## 2023-02-23 ENCOUNTER — Ambulatory Visit: Payer: 59 | Admitting: Family

## 2023-02-23 NOTE — Telephone Encounter (Signed)
Please send no show letter

## 2023-02-24 NOTE — Telephone Encounter (Signed)
Letter mailed out to patient.

## 2023-03-06 DIAGNOSIS — M170 Bilateral primary osteoarthritis of knee: Secondary | ICD-10-CM | POA: Diagnosis not present

## 2023-03-06 DIAGNOSIS — Z79899 Other long term (current) drug therapy: Secondary | ICD-10-CM | POA: Diagnosis not present

## 2023-03-12 ENCOUNTER — Ambulatory Visit (HOSPITAL_BASED_OUTPATIENT_CLINIC_OR_DEPARTMENT_OTHER): Payer: 59 | Attending: Physical Medicine and Rehabilitation | Admitting: Physical Therapy

## 2023-03-12 NOTE — Therapy (Deleted)
 OUTPATIENT PHYSICAL THERAPY LOWER EXTREMITY EVALUATION   Patient Name: Hailey Vance MRN: [id number] DOB:September 20, 1965, 58 y.o., female Today's Date: 03/12/2023  END OF SESSION:   Past Medical History:  Diagnosis Date   Bilateral primary osteoarthritis of knee 03/29/2019   Chronic back pain    Cystocele with rectocele    Generalized anxiety disorder    GERD (gastroesophageal reflux disease)    Headache    otc med prn   Heart murmur    echo in epic 11-26-2017  normal w/ ef 55-60%   History of chest pain    pt referred for atyical chest pain , cardiology evaluation w/ dr berry note in epic 10-25-2014, stated with low risk w/ no ischemia , nuclear ef 70% , done 09-19-2014 in epic,  non-cardiac chest pain   HTN (hypertension)    followed by pcp    (normal nuclear stress test 09-19-2014 in epic)   Low blood potassium    Major depressive disorder    OSA (obstructive sleep apnea)    04/2015 study in e- Severe OSA (AHI = 55.0/hour);   (10-06-2019  per pt has not used cpap since 2019 after gastric bypass)   Osteoarthritis of both hips 06/15/2019   Plantar fasciitis of left foot    S/P gastric bypass 02/15/2018   sleeve   SUI (stress urinary incontinence, female)    Vitamin D  deficiency 09/06/2014   Wears glasses    Past Surgical History:  Procedure Laterality Date   BLADDER SUSPENSION N/A 10/12/2019   Procedure: TRANSVAGINAL TAPE (TVT) PROCEDURE;  Surgeon: Sarrah Browning, MD;  Location: [facility] [place];  Service: Gynecology;  Laterality: N/A;   CESAREAN SECTION W/BTL  1990   COLONOSCOPY WITH PROPOFOL  N/A 08/30/2015   Procedure: COLONOSCOPY WITH PROPOFOL ;  Surgeon: Gustav LULLA Mcgee, MD;  Location: [facility];  Service: Endoscopy;  Laterality: N/A;   CYSTOCELE REPAIR N/A 10/12/2019   Procedure: ANTERIOR (CYSTOCELE);  Surgeon: Sarrah Browning, MD;  Location: [facility];  Service: Gynecology;  Laterality: N/A;   CYSTOSCOPY N/A 05/23/2015   Procedure:  CYSTOSCOPY;  Surgeon: Browning Sarrah, MD;  Location: [facility];  Service: Gynecology;  Laterality: N/A;   CYSTOSCOPY N/A 10/12/2019   Procedure: CYSTOSCOPY;  Surgeon: Sarrah Browning, MD;  Location: [facility];  Service: Gynecology;  Laterality: N/A;   HIATAL HERNIA REPAIR N/A 02/15/2018   Procedure: HERNIA REPAIR HIATAL;  Surgeon: Mikell Katz, MD;  Location: [facility];  Service: General;  Laterality: N/A;   HYSTEROSCOPY WITH D & C N/A 01/11/2015   Procedure: DILATATION AND CURETTAGE /HYSTEROSCOPY;  Surgeon: Jolene Gaskins, MD;  Location: [facility];  Service: Gynecology;  Laterality: N/A;   KNEE ARTHROSCOPY Right 2010   LAPAROSCOPIC GASTRIC SLEEVE RESECTION N/A 02/15/2018   Procedure: LAPAROSCOPIC GASTRIC SLEEVE RESECTION WITH UPPER ENDO AND ERAS PATHWAY;  Surgeon: Mikell Katz, MD;  Location: [facility];  Service: General;  Laterality: N/A;   LAPAROSCOPIC OOPHORECTOMY Bilateral 2004   LEFT OOPHORECTOMY AND PARTIAL RIGHT OOPHORECTOMY   LIPOMA EXCISION Left 12/02/2016   Procedure: EXCISION LIPOMA OF LEFT SHOULDER;  Surgeon: Anderson Maude ORN, MD;  Location: [facility];  Service: Orthopedics;  Laterality: Left;   ROBOTIC ASSISTED TOTAL HYSTERECTOMY WITH SALPINGECTOMY Bilateral 05/23/2015   Procedure: ROBOTIC ASSISTED TOTAL HYSTERECTOMY WITH SALPINGECTOMYwith right oophorectomy;  Surgeon: Browning Sarrah, MD;  Location: [facility];  Service: Gynecology;  Laterality: Bilateral;   UMBILICAL HERNIA REPAIR  2002   Patient Active Problem List   Diagnosis Date Noted  Fibromyalgia 12/31/2022   Gastroesophageal reflux disease 05/14/2022   History of kidney stones 05/14/2022   Renal cyst 06/26/2021   Left shoulder pain 09/12/2020   Primary osteoarthritis, left shoulder 09/11/2020   Dysphagia 08/23/2020   B12 deficiency 08/22/2020   Hyperlipidemia 08/22/2020   Moderate episode of recurrent major depressive disorder (HCC) 08/22/2020   Major depressive disorder    Osteoarthritis of both hips  06/15/2019   Osteoarthritis of knees, bilateral 03/29/2019   Morbid obesity (HCC) 02/15/2018   Lipoma of left shoulder 12/02/2016   BMI 45.0-49.9, adult [facility])    Preventative health care 10/10/2014   Essential hypertension 09/06/2014   Osteoarthritis 09/06/2014   Post-menopausal bleeding 09/06/2014   OSA (obstructive sleep apnea) 09/06/2014   Generalized anxiety disorder 09/06/2014   Vitamin D  deficiency 09/06/2014   Chronic back pain     PCP: Eleanor Ponto NP  REFERRING PROVIDER: Trudy Duwaine BRAVO, NP   REFERRING DIAG:  [phone] (ICD-10-CM) - Chronic bilateral low back pain with left-sided sciatica  M54.16 (ICD-10-CM) - Lumbar radiculopathy  E66.01 (ICD-10-CM) - Morbid (severe) obesity due to excess calories (HCC)    THERAPY DIAG:  No diagnosis found.  Rationale for Evaluation and Treatment: Rehabilitation  ONSET DATE: ***  SUBJECTIVE:   SUBJECTIVE STATEMENT: Recent dx of fibromyalgia  PERTINENT HISTORY: History of multiple regimens of formal physical therapy/chiropractor treatments with minimal relief of pain. Feels PT caused more severe discomfort.   Has had ablations, steroid injection and facet/medial branch blocks with min-moderate relief of pain PAIN:  Are you having pain? Yes: NPRS scale: *** Pain location: bilateral lower back pain radiating to left buttock and down lateral leg to foot Pain description: sore and achy Aggravating factors: *** Relieving factors: ***  PRECAUTIONS: {Therapy precautions:24002}  RED FLAGS: None   WEIGHT BEARING RESTRICTIONS: No  FALLS:  Has patient fallen in last 6 months? {fallsyesno:27318}  LIVING ENVIRONMENT: Lives with: {OP[facility] lives with:25569::lives with their family} Lives in: {Lives in:25570} Stairs: {opstairs:27293} Has following equipment at home: {Assistive devices:23999}  OCCUPATION: ***  PLOF: {PLOF:24004}  PATIENT GOALS: ***  NEXT MD VISIT: ***  OBJECTIVE:  Note: Objective measures  were completed at Evaluation unless otherwise noted.  DIAGNOSTIC FINDINGS: Lumbar MRI imaging from 2023 exhibits moderate facet arthropathy and ligamentum flavum thickening at both L3-L4 and L4-L5.   PATIENT SURVEYS:  FOTO ***  COGNITION: Overall cognitive status: {cognition:24006}     SENSATION: {sensation:27233}  EDEMA:  {edema:24020}  MUSCLE LENGTH: Hamstrings: Right *** deg; Left *** deg   POSTURE: {posture:25561}  PALPATION: ***  LOWER EXTREMITY ROM:  {AROM/PROM:27142} ROM Right eval Left eval  Hip flexion    Hip extension    Hip abduction    Hip adduction    Hip internal rotation    Hip external rotation    Knee flexion    Knee extension    Ankle dorsiflexion    Ankle plantarflexion    Ankle inversion    Ankle eversion     (Blank rows = not tested)  LOWER EXTREMITY MMT:  MMT Right eval Left eval  Hip flexion    Hip extension    Hip abduction    Hip adduction    Hip internal rotation    Hip external rotation    Knee flexion    Knee extension    Ankle dorsiflexion    Ankle plantarflexion    Ankle inversion    Ankle eversion     (Blank rows = not tested)  LOWER EXTREMITY SPECIAL TESTS:  {  LEspecialtests:26242}  FUNCTIONAL TESTS:  5 times sit to stand: *** Timed up and go (TUG): *** ***  GAIT: Distance walked: *** Assistive device utilized: {Assistive devices:23999} Level of assistance: {Levels of assistance:24026} Comments: ***                                                                                                                                TREATMENT DATE: ***    PATIENT EDUCATION:  Education details: Discussed eval findings, rehab rationale, aquatic program progression/POC and pools in area. Patient is in agreement  Person educated: Patient Education method: Explanation Education comprehension: verbalized understanding  HOME EXERCISE PROGRAM: ***  ASSESSMENT:  CLINICAL IMPRESSION: Patient is a 58 y.o. f who  was seen today for physical therapy evaluation and treatment for ***.   OBJECTIVE IMPAIRMENTS: {opptimpairments:25111}.   ACTIVITY LIMITATIONS: {activitylimitations:27494}  PARTICIPATION LIMITATIONS: {participationrestrictions:25113}  PERSONAL FACTORS: {Personal factors:25162} are also affecting patient's functional outcome.   REHAB POTENTIAL: Good  CLINICAL DECISION MAKING: Evolving/moderate complexity  EVALUATION COMPLEXITY: Moderate   GOALS: Goals reviewed with patient? Yes  SHORT TERM GOALS: Target date: *** *** Baseline: Goal status: INITIAL  2.  *** Baseline:  Goal status: INITIAL  3.  *** Baseline:  Goal status: INITIAL  4.  *** Baseline:  Goal status: INITIAL  5.  *** Baseline:  Goal status: INITIAL  6.  *** Baseline:  Goal status: INITIAL  LONG TERM GOALS: Target date: ***  *** Baseline:  Goal status: INITIAL  2.  *** Baseline:  Goal status: INITIAL  3.  *** Baseline:  Goal status: INITIAL  4.  *** Baseline:  Goal status: INITIAL  5.  *** Baseline:  Goal status: INITIAL  6.  *** Baseline:  Goal status: INITIAL   PLAN:  PT FREQUENCY: {rehab frequency:25116}  PT DURATION: {rehab duration:25117}  PLANNED INTERVENTIONS: 97164- PT Re-evaluation, 97110-Therapeutic exercises, 97530- Therapeutic activity, 97112- Neuromuscular re-education, 97535- Self Care, 02859- Manual therapy, [id number]- Gait training, [phone]- Orthotic Fit/training, [id number]- Aquatic Therapy, 97014- Electrical stimulation (unattended), 97033- Ionotophoresis 4mg /ml Dexamethasone , Patient/Family education, Balance training, Stair training, Taping, Dry Needling, Joint mobilization, Cryotherapy, and Moist heat  PLAN FOR NEXT SESSION: PIERRETTE Shuck [place]) Deannah Rossi MPT 03/12/23 10:50 AM [facility] [address] [place], [place], [address] Phone: [phone]   Fax:  [phone]

## 2023-03-19 LAB — HM DIABETES EYE EXAM

## 2023-03-31 ENCOUNTER — Ambulatory Visit: Payer: 59 | Admitting: Physical Medicine and Rehabilitation

## 2023-04-03 ENCOUNTER — Ambulatory Visit: Payer: 59 | Admitting: Family

## 2023-04-03 ENCOUNTER — Ambulatory Visit: Payer: 59 | Admitting: Orthopaedic Surgery

## 2023-04-04 DIAGNOSIS — G4733 Obstructive sleep apnea (adult) (pediatric): Secondary | ICD-10-CM | POA: Diagnosis not present

## 2023-04-06 DIAGNOSIS — Z79899 Other long term (current) drug therapy: Secondary | ICD-10-CM | POA: Diagnosis not present

## 2023-04-06 DIAGNOSIS — R030 Elevated blood-pressure reading, without diagnosis of hypertension: Secondary | ICD-10-CM | POA: Diagnosis not present

## 2023-04-06 DIAGNOSIS — M170 Bilateral primary osteoarthritis of knee: Secondary | ICD-10-CM | POA: Diagnosis not present

## 2023-04-08 ENCOUNTER — Ambulatory Visit (INDEPENDENT_AMBULATORY_CARE_PROVIDER_SITE_OTHER): Payer: 59 | Admitting: Family

## 2023-04-08 VITALS — BP 130/74 | HR 70 | Temp 98.5°F | Resp 16 | Ht 63.0 in | Wt 257.0 lb

## 2023-04-08 DIAGNOSIS — R200 Anesthesia of skin: Secondary | ICD-10-CM | POA: Diagnosis not present

## 2023-04-08 DIAGNOSIS — I10 Essential (primary) hypertension: Secondary | ICD-10-CM

## 2023-04-08 DIAGNOSIS — R110 Nausea: Secondary | ICD-10-CM | POA: Diagnosis not present

## 2023-04-08 DIAGNOSIS — R319 Hematuria, unspecified: Secondary | ICD-10-CM

## 2023-04-08 DIAGNOSIS — E538 Deficiency of other specified B group vitamins: Secondary | ICD-10-CM | POA: Diagnosis not present

## 2023-04-08 DIAGNOSIS — R202 Paresthesia of skin: Secondary | ICD-10-CM

## 2023-04-08 DIAGNOSIS — G4733 Obstructive sleep apnea (adult) (pediatric): Secondary | ICD-10-CM

## 2023-04-08 DIAGNOSIS — M5412 Radiculopathy, cervical region: Secondary | ICD-10-CM | POA: Insufficient documentation

## 2023-04-08 DIAGNOSIS — E559 Vitamin D deficiency, unspecified: Secondary | ICD-10-CM

## 2023-04-08 MED ORDER — DICLOFENAC SODIUM 1 % EX GEL: 2.0000 g | Freq: Every day | CUTANEOUS | 2 refills | Status: AC | PRN

## 2023-04-08 MED ORDER — AMLODIPINE BESYLATE 10 MG PO TABS: 10.0000 mg | ORAL_TABLET | Freq: Every day | ORAL | 1 refills | Status: DC

## 2023-04-08 MED ORDER — LISINOPRIL 30 MG PO TABS: 30.0000 mg | ORAL_TABLET | Freq: Every day | ORAL | 0 refills | Status: DC

## 2023-04-08 MED ORDER — CYANOCOBALAMIN 1000 MCG/ML IJ SOLN: 1000.0000 ug | INTRAMUSCULAR | 0 refills | Status: AC

## 2023-04-08 MED ORDER — BD DISP NEEDLE 25G X 1" MISC: 1 refills | Status: AC

## 2023-04-08 MED ORDER — SYRINGE/NEEDLE (DISP) 23G X 1" 3 ML MISC: 1 refills | Status: AC

## 2023-04-08 MED ORDER — DULOXETINE HCL 60 MG PO CPEP: 60.0000 mg | ORAL_CAPSULE | Freq: Every day | ORAL | 1 refills | Status: AC

## 2023-04-08 MED ORDER — ONDANSETRON HCL 4 MG PO TABS: 4.0000 mg | ORAL_TABLET | Freq: Three times a day (TID) | ORAL | 0 refills | Status: AC | PRN

## 2023-04-08 MED ORDER — BUSPIRONE HCL 7.5 MG PO TABS: 7.5000 mg | ORAL_TABLET | Freq: Two times a day (BID) | ORAL | 2 refills | Status: AC

## 2023-04-08 MED ORDER — ALBUTEROL SULFATE HFA 108 (90 BASE) MCG/ACT IN AERS: INHALATION_SPRAY | RESPIRATORY_TRACT | 1 refills | Status: AC

## 2023-04-08 MED ORDER — DULOXETINE HCL 30 MG PO CPEP: ORAL_CAPSULE | ORAL | 0 refills | Status: DC

## 2023-04-08 NOTE — Assessment & Plan Note (Signed)
 No symptoms today.  Could consider steroid taper if recurrent symptoms. Gabapentin  is also likely helpful.

## 2023-04-08 NOTE — Assessment & Plan Note (Addendum)
 Not using cpap, will obtain machine from her daughter to restart. I think this is why she is waking up gasping at night.

## 2023-04-08 NOTE — Assessment & Plan Note (Signed)
Restart b12 injections.  

## 2023-04-08 NOTE — Assessment & Plan Note (Signed)
 Wt Readings from Last 3 Encounters:  04/08/23 257 lb (116.6 kg)  02/17/23 266 lb 6.4 oz (120.8 kg)  12/30/22 274 lb 6.4 oz (124.5 kg)   Weight is coming down nicely.  Continue mounjaro .

## 2023-04-08 NOTE — Assessment & Plan Note (Signed)
 BP looks good today. Continue metoprolol  and amlodipine .

## 2023-04-08 NOTE — Assessment & Plan Note (Signed)
Update vitamin D level. 

## 2023-04-08 NOTE — Patient Instructions (Signed)
 VISIT SUMMARY:  Today, we discussed several of your health concerns, including neck and shoulder pain, tingling sensations, balance issues, sleep apnea, cold extremities, nausea, weakness, and a recent episode of vaginal bleeding. We also reviewed your current medications and supplements.  YOUR PLAN:  -RIGHT-SIDED NECK AND SHOULDER PAIN: Your neck and shoulder pain is likely due to cervical radiculopathy, which is when a nerve in your neck gets pinched or irritated. We will consider imaging if your symptoms persist or worsen.  -TRANSIENT RIGHT-SIDED TINGLING: The tingling sensation on your right side may be due to anxiety, but we need to rule out a stroke given your history of high blood pressure. We will order a CT scan of your brain to ensure there is no stroke.  -SLEEP APNEA: Your episodes of gasping for air are likely due to untreated sleep apnea since your CPAP machine is not with you. Please arrange to have your CPAP machine mailed to your current location.  -BALANCE ISSUES: Your recent falls and balance issues need to be monitored closely. If these symptoms persist, we may refer you to physical therapy.  -RAYNAUD'S DISEASE: The cold sensations in your extremities, especially when lying down, suggest Raynaud's disease, which affects blood flow to certain parts of your body. Keep your extremities warm, especially in cold weather.  -NAUSEA: Your nausea is likely a side effect of mounjaro . We will prescribe anti-nausea medication for you to use as needed.  -VITAMIN B12 DEFICIENCY: You have run out of your B12 injections, which are important for maintaining healthy nerve and blood cells. We will resend your prescription to the pharmacy.  -MEDICATION REFILLS: We will refill all your necessary medications, including Cymbalta  60mg  daily.  -VITAMIN D  DEFICIENCY: Previous labs showed low Vitamin D  levels, which is important for bone health. We will order a Vitamin D  level test and may increase  your supplementation if it is still low.  -PAIN MANAGEMENT: You find gabapentin  effective for managing your pain. Continue with your current regimen.  -WEIGHT LOSS: You have lost significant weight since your last visit, likely due to Mounjaro  12.5mg . Continue with your current regimen and we will monitor your weight closely.  INSTRUCTIONS:  Please follow up with the CT scan of your brain to rule out a stroke. Arrange to have your CPAP machine mailed to your current location. Continue monitoring your balance issues and keep your extremities warm. Use the anti-nausea medication as needed and follow up with the pelvic exam today. We will resend your B12 prescription and refill all necessary medications. We will also order a Vitamin D  level test and may adjust your supplementation based on the results. Continue with your current pain management and weight loss regimen.

## 2023-04-08 NOTE — Progress Notes (Signed)
 9  Subjective:     Patient ID: Hailey Vance, female    DOB: [birth year], 58 y.o.   MRN: [id number]  Chief Complaint  Patient presents with   Hypertension    Here for follow up   Diabetes    Here for follow up    HPI  Discussed the use of AI scribe software for clinical note transcription with the patient, who gave verbal consent to proceed.  History of Present Illness   Hailey Vance is a 58 year old female who presents with multiple concerns including shoulder and neck pain, tingling sensation, and balance issues.  She experiences aching and shooting pain in her neck and right shoulder, with radiation down the right arm. A significant episode occurred on Monday night, involving a tingling sensation on her entire right side, described as a light tingle rather than the 'bad tingle' associated with a limb waking up. This was accompanied by a feeling of constriction from her feet upwards. She has a history of a disc issue following an accident in 2008 or 2009, treated by a chiropractor. No recurrence of the tingling sensation has occurred since Monday night, and there is no current pain in the neck or down the arm.  She reports episodes of gasping for air, occurring twice while awake and once during sleep. She is not currently using her CPAP as she left it at her daughter's house in [place] . She experiences no unusual shortness of breath aside from these episodes.  She describes balance issues, having fallen twice last week, with these issues predating the tingling episode. She experiences icy cold sensations in her feet, lower legs, hands, and lower arms, particularly when lying down. She uses socks and gloves to manage these symptoms, which are helpful but not immediately effective. Her skin becomes pale and sometimes blue in these areas, suggesting Raynaud's disease.  She experiences nausea and weakness, particularly after physical activity, such as moving from the back to the  front of her home. Some nausea is associated with difficulty swallowing, as she has not yet undergone a procedure to stretch her esophagus. Food sometimes gets stuck, causing chest pain until it passes. She does not eat three meals a day due to lack of appetite, often consuming only yogurt or pudding.  In December, she experienced eight days of vaginal bleeding, described as a 'real bad period.' She has had a total hysterectomy, including removal of the uterus and fallopian tubes, and is certain the bleeding was vaginal and not rectal. She has not had intercourse in years and uses pads for incontinence at night. She noted blood in the toilet and on the pad.  She is currently taking gabapentin  for pain, which she finds effective, and Mounjaro  12.5 mg, which has contributed to a weight loss of nearly twenty pounds since October. She is also on Cymbalta , but is unsure of its effect on her mood as she spends most of her time alone. She has been using vitamin D  supplements and B12 shots, although she ran out of the latter and needs a refill.    BP Readings from Last 3 Encounters:  04/08/23 130/74  12/30/22 (!) 122/90  10/22/22 (!) 138/90  No results found for: HGBA1C Lab Results  Component Value Date   LDLCALC 104 (H) 05/14/2022   CREATININE 1.10 05/26/2022   Wt Readings from Last 3 Encounters:  04/08/23 257 lb (116.6 kg)  02/17/23 266 lb 6.4 oz (120.8 kg)  12/30/22 274 lb 6.4 oz (124.5  kg)          Health Maintenance Due  Topic Date Due   Zoster Vaccines- Shingrix (1 of 2) Never done   COVID-19 Vaccine (2 - Pfizer risk series) 09/12/2020   MAMMOGRAM  05/19/2022    Past Medical History:  Diagnosis Date   Bilateral primary osteoarthritis of knee 03/29/2019   Chronic back pain    Cystocele with rectocele    Generalized anxiety disorder    GERD (gastroesophageal reflux disease)    Headache    otc med prn   Heart murmur    echo in epic 11-26-2017  normal w/ ef 55-60%   History  of chest pain    pt referred for atyical chest pain , cardiology evaluation w/ dr berry note in epic 10-25-2014, stated with low risk w/ no ischemia , nuclear ef 70% , done 09-19-2014 in epic,  non-cardiac chest pain   HTN (hypertension)    followed by pcp    (normal nuclear stress test 09-19-2014 in epic)   Low blood potassium    Major depressive disorder    OSA (obstructive sleep apnea)    04/2015 study in e- Severe OSA (AHI = 55.0/hour);   (10-06-2019  per pt has not used cpap since 2019 after gastric bypass)   Osteoarthritis of both hips 06/15/2019   Plantar fasciitis of left foot    S/P gastric bypass 02/15/2018   sleeve   SUI (stress urinary incontinence, female)    Vitamin D  deficiency 09/06/2014   Wears glasses     Past Surgical History:  Procedure Laterality Date   BLADDER SUSPENSION N/A 10/12/2019   Procedure: TRANSVAGINAL TAPE (TVT) PROCEDURE;  Surgeon: Sarrah Browning, MD;  Location: [facility];  Service: Gynecology;  Laterality: N/A;   CESAREAN SECTION W/BTL  1990   COLONOSCOPY WITH PROPOFOL  N/A 08/30/2015   Procedure: COLONOSCOPY WITH PROPOFOL ;  Surgeon: Gustav LULLA Mcgee, MD;  Location: [facility];  Service: Endoscopy;  Laterality: N/A;   CYSTOCELE REPAIR N/A 10/12/2019   Procedure: ANTERIOR (CYSTOCELE);  Surgeon: Sarrah Browning, MD;  Location: [facility];  Service: Gynecology;  Laterality: N/A;   CYSTOSCOPY N/A 05/23/2015   Procedure: CYSTOSCOPY;  Surgeon: Browning Sarrah, MD;  Location: [facility];  Service: Gynecology;  Laterality: N/A;   CYSTOSCOPY N/A 10/12/2019   Procedure: CYSTOSCOPY;  Surgeon: Sarrah Browning, MD;  Location: [facility];  Service: Gynecology;  Laterality: N/A;   HIATAL HERNIA REPAIR N/A 02/15/2018   Procedure: HERNIA REPAIR HIATAL;  Surgeon: Mikell Katz, MD;  Location: [facility];  Service: General;  Laterality: N/A;   HYSTEROSCOPY WITH D & C N/A 01/11/2015   Procedure: DILATATION AND CURETTAGE  /HYSTEROSCOPY;  Surgeon: Jolene Gaskins, MD;  Location: [facility];  Service: Gynecology;  Laterality: N/A;   KNEE ARTHROSCOPY Right 2010   LAPAROSCOPIC GASTRIC SLEEVE RESECTION N/A 02/15/2018   Procedure: LAPAROSCOPIC GASTRIC SLEEVE RESECTION WITH UPPER ENDO AND ERAS PATHWAY;  Surgeon: Mikell Katz, MD;  Location: [facility];  Service: General;  Laterality: N/A;   LAPAROSCOPIC OOPHORECTOMY Bilateral 2004   LEFT OOPHORECTOMY AND PARTIAL RIGHT OOPHORECTOMY   LIPOMA EXCISION Left 12/02/2016   Procedure: EXCISION LIPOMA OF LEFT SHOULDER;  Surgeon: Anderson Maude ORN, MD;  Location: [facility];  Service: Orthopedics;  Laterality: Left;   ROBOTIC ASSISTED TOTAL HYSTERECTOMY WITH SALPINGECTOMY Bilateral 05/23/2015   Procedure: ROBOTIC ASSISTED TOTAL HYSTERECTOMY WITH SALPINGECTOMYwith right oophorectomy;  Surgeon: Browning Sarrah, MD;  Location: [facility];  Service: Gynecology;  Laterality: Bilateral;  UMBILICAL HERNIA REPAIR  2002    Family History  Problem Relation Age of Onset   Lung cancer Mother 42       dec   Cancer Mother    Hypertension Mother    Arthritis Mother    Cancer Father 33       prostate   Hypertension Father    Leukemia Sister    Hypertension Sister    Arthritis Sister    Hyperlipidemia Sister    Hypertension Brother    Arthritis Brother    Multiple sclerosis Sister    Hypertension Sister    Hypertension Sister    Hypertension Brother    Hypertension Brother    Breast cancer Paternal Aunt    Alzheimer's disease Paternal Aunt    Diabetes Maternal Grandmother    Stroke Maternal Grandmother    Heart attack Paternal Grandmother    Asthma Other     Social History   Socioeconomic History   Marital status: Divorced    Spouse name: Not on file   Number of children: Not on file   Years of education: 12   Highest education level: High school graduate  Occupational History   Occupation: Disability  Tobacco Use   Smoking status: Never   Smokeless tobacco: Never  Vaping Use    Vaping status: Never Used  Substance and Sexual Activity   Alcohol use: No    Alcohol/week: 0.0 standard drinks of alcohol   Drug use: Never   Sexual activity: Not on file    Comment: BTL w/ C/S in 1990  Other Topics Concern   Not on file  Social History Narrative   Divorced   Lives with son and grand-daughter   92- Son Asher (lives in [place])   8012- Son Lowanda lives with patient   85- Daughter Harlene (lives in [place])   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   Completed some college   Geophysical Data Processor   Social Drivers of Health   Financial Resource Strain: Low Risk  (01/01/2023)   Overall Financial Resource Strain (CARDIA)    Difficulty of Paying Living Expenses: Not very hard  Food Insecurity: No Food Insecurity (05/29/2022)   Hunger Vital Sign    Worried About Running Out of Food in the Last Year: Never true    Ran Out of Food in the Last Year: Never true  Transportation Needs: No Transportation Needs (05/29/2022)   PRAPARE - Administrator, Civil Service (Medical): No    Lack of Transportation (Non-Medical): No  Physical Activity: Inactive (01/01/2023)   Exercise Vital Sign    Days of Exercise per Week: 0 days    Minutes of Exercise per Session: 0 min  Stress: No Stress Concern Present (01/01/2023)   [employer] of Occupational Health - Occupational Stress Questionnaire    Feeling of Stress : Only a little  Social Connections: Socially Isolated (01/01/2023)   Social Connection and Isolation Panel [NHANES]    Frequency of Communication with Friends and Family: Three times a week    Frequency of Social Gatherings with Friends and Family: Once a week    Attends Religious Services: Never    Database Administrator or Organizations: No    Attends Banker Meetings: Never    Marital Status: Divorced  Catering Manager Violence: Not At Risk (01/01/2023)   Humiliation, Afraid, Rape, and Kick questionnaire    Fear of Current or  Ex-Partner: No    Emotionally Abused: No  Physically Abused: No    Sexually Abused: No    Outpatient Medications Prior to Visit  Medication Sig Dispense Refill   Artificial Tear Ointment (DRY EYES OP) Apply to eye as needed.     buPROPion  (WELLBUTRIN  XL) 300 MG 24 hr tablet TAKE 1 TABLET BY MOUTH ONCE DAILY . APPOINTMENT REQUIRED FOR FUTURE REFILLS 90 tablet 1   Cholecalciferol (VITAMIN D3 GUMMIES PO) Take 2 tablets by mouth daily. Reports 5000 IU takes 2 gummies daily     cyanocobalamin  (VITAMIN B12) 1000 MCG tablet Take 1 tablet (1,000 mcg total) by mouth daily.     famotidine  (PEPCID ) 20 MG tablet Take 1 tablet (20 mg total) by mouth at bedtime. 90 tablet 3   furosemide  (LASIX ) 20 MG tablet Take 1 tablet (20 mg total) by mouth daily as needed for edema. 30 tablet 3   gabapentin  (NEURONTIN ) 300 MG capsule Take 1 capsule (300 mg total) by mouth 3 (three) times daily. 270 capsule 0   metoprolol  succinate (TOPROL -XL) 100 MG 24 hr tablet Take 1 tablet (100 mg total) by mouth daily. Take with or immediately following a meal. 90 tablet 3   multivitamin-iron-minerals-folic acid  (CENTRUM) chewable tablet Chew 1 tablet by mouth daily.     nystatin  (MYCOSTATIN /NYSTOP ) powder Apply 1 Application topically 2 (two) times daily. 60 g 2   omeprazole  (PRILOSEC) 40 MG capsule Take 1 capsule (40 mg total) by mouth daily. Daily 30 minutes before breakfast 90 capsule 3   tirzepatide  (MOUNJARO ) 12.5 MG/0.5ML Pen Inject 12.5 mg into the skin once a week. 6 mL 1   albuterol  (VENTOLIN  HFA) 108 (90 Base) MCG/ACT inhaler INHALE 2 PUFFS BY MOUTH EVERY 6 HOURS AS NEEDED FOR WHEEZING FOR SHORTNESS OF BREATH 9 g 1   amLODipine  (NORVASC ) 10 MG tablet TAKE 1 TABLET BY MOUTH EVERY DAY 90 tablet 1   busPIRone  (BUSPAR ) 7.5 MG tablet Take 1 tablet (7.5 mg total) by mouth 2 (two) times daily. 60 tablet 2   cyanocobalamin  (VITAMIN B12) 1000 MCG/ML injection Inject 1 mL (1,000 mcg total) into the muscle every 30 (thirty) days.  10 mL 0   diclofenac  Sodium (VOLTAREN ) 1 % GEL Apply 2 g topically daily as needed. 100 g 2   DULoxetine  (CYMBALTA ) 30 MG capsule Take 1 cap by mouth once daily for 3 days, then increase to 2 caps once daily on day 4. 60 capsule 0   lisinopril  (ZESTRIL ) 30 MG tablet Take 1 tablet (30 mg total) by mouth daily. 90 tablet 0   NEEDLE, DISP, 25 G (B-D DISP NEEDLE 25GX1) 25G X 1 MISC For B12 injections 50 each 1   SYRINGE-NEEDLE, DISP, 3 ML 23G X 1 3 ML MISC For B12 injections 50 each 1   venlafaxine  XR (EFFEXOR  XR) 37.5 MG 24 hr capsule 3 tabs by mouth once daily for 1 week, then 2 tabs once daily for 1 week, then 1 tab daily for 1 week then stop 42 capsule 0   No facility-administered medications prior to visit.    Allergies  Allergen Reactions   Penicillins Itching and Other (See Comments)    Has patient had a PCN reaction causing immediate rash, facial/tongue/throat swelling, SOB or lightheadedness with hypotension: No Has patient had a PCN reaction causing severe rash involving mucus membranes or skin necrosis: No Has patient had a PCN reaction that required hospitalization No Has patient had a PCN reaction occurring within the last 10 years: No If all of the above answers are NO,  then may proceed with Cephalosporin use.     ROS See HPI    Objective:    Physical Exam Exam conducted with a chaperone present.  Constitutional:      General: She is not in acute distress.    Appearance: Normal appearance. She is well-developed.  HENT:     Head: Normocephalic and atraumatic.     Right Ear: External ear normal.     Left Ear: External ear normal.  Eyes:     General: No scleral icterus. Neck:     Thyroid : No thyromegaly.  Cardiovascular:     Rate and Rhythm: Normal rate and regular rhythm.     Heart sounds: Normal heart sounds. No murmur heard. Pulmonary:     Effort: Pulmonary effort is normal. No respiratory distress.     Breath sounds: Normal breath sounds. No wheezing.   Genitourinary:    General: Normal vulva.     Labia:        Right: No rash.        Left: No rash.      Vagina: No erythema, bleeding or lesions.     Uterus: Absent.      Adnexa: Right adnexa normal and left adnexa normal.       Right: No mass.         Left: No mass.    Musculoskeletal:     Cervical back: Neck supple.  Skin:    General: Skin is warm and dry.  Neurological:     General: No focal deficit present.     Mental Status: She is alert and oriented to person, place, and time. Mental status is at baseline.     Cranial Nerves: No cranial nerve deficit.     Motor: No weakness.  Psychiatric:        Mood and Affect: Mood normal.        Behavior: Behavior normal.        Thought Content: Thought content normal.        Judgment: Judgment normal.      BP 130/74 (BP Location: Right Arm, Patient Position: Sitting, Cuff Size: Large)   Pulse 70   Temp 98.5 F (36.9 C) (Oral)   Resp 16   Ht 5' 3 (1.6 m)   Wt 257 lb (116.6 kg)   LMP 10/02/2007   SpO2 100%   BMI 45.53 kg/m  Wt Readings from Last 3 Encounters:  04/08/23 257 lb (116.6 kg)  02/17/23 266 lb 6.4 oz (120.8 kg)  12/30/22 274 lb 6.4 oz (124.5 kg)       Assessment & Plan:   Problem List Items Addressed This Visit       Unprioritized   Vitamin D  deficiency   Update vitamin D  level.      Relevant Orders   VITAMIN D  25 Hydroxy (Vit-D Deficiency, Fractures)   OSA (obstructive sleep apnea)   Not using cpap, will obtain machine from her daughter to restart. I think this is why she is waking up gasping at night.       Numbness and tingling    Transient right-sided tingling Likely due to anxiety, but given the patient's history of hypertension, it is important to rule out a cerebrovascular event. -Order CT scan of the brain to rule out stroke. Patient reports recent falls and balance issues. -Monitor closely, consider referral to physical therapy if symptoms persist.       Relevant Orders   CT HEAD WO  CONTRAST ( )  Nausea   Likely secondary to Mounjaro , will rx with zofran  PRN.       Relevant Medications   ondansetron  (ZOFRAN ) 4 MG tablet   Morbid obesity (HCC)   Wt Readings from Last 3 Encounters:  04/08/23 257 lb (116.6 kg)  02/17/23 266 lb 6.4 oz (120.8 kg)  12/30/22 274 lb 6.4 oz (124.5 kg)   Weight is coming down nicely.  Continue mounjaro .       Hematuria   Initially she stated blood was coming from the vagina, but upon further questioning, it sounds more like she was having hematuria with bloody urinary incontinence.  Normal vaginal exam, s/p hysterectomy. Will obtain urinalysis to evaluate for microscopic hematuria.       Relevant Orders   Urinalysis, Routine w reflex microscopic   Essential hypertension   BP looks good today. Continue metoprolol  and amlodipine .       Relevant Medications   amLODipine  (NORVASC ) 10 MG tablet   lisinopril  (ZESTRIL ) 30 MG tablet   Cervical radiculopathy - Primary   No symptoms today.  Could consider steroid taper if recurrent symptoms. Gabapentin  is also likely helpful.        Relevant Medications   busPIRone  (BUSPAR ) 7.5 MG tablet   DULoxetine  (CYMBALTA ) 60 MG capsule   B12 deficiency   Restart b12 injections.      40 minutes spent on today's visit. Time was spent reviewing medical record, counseling pt on multiple medical concerns and formulating complex medical plan.   I have discontinued Hailey HERO. Buechner's DULoxetine , venlafaxine  XR, and DULoxetine . I have also changed her amLODipine . Additionally, I am having her start on ondansetron  and DULoxetine . Lastly, I am having her maintain her multivitamin-iron-minerals-folic acid , Artificial Tear Ointment (DRY EYES OP), furosemide , nystatin , buPROPion , cyanocobalamin , Cholecalciferol (VITAMIN D3 GUMMIES PO), gabapentin , omeprazole , famotidine , tirzepatide , metoprolol  succinate, albuterol , busPIRone , cyanocobalamin , diclofenac  Sodium, lisinopril , B-D DISP NEEDLE 25GX1, and  SYRINGE-NEEDLE (DISP) 3 ML.  Meds ordered this encounter  Medications   amLODipine  (NORVASC ) 10 MG tablet    Sig: Take 1 tablet (10 mg total) by mouth daily.    Dispense:  90 tablet    Refill:  1   albuterol  (VENTOLIN  HFA) 108 (90 Base) MCG/ACT inhaler    Sig: INHALE 2 PUFFS BY MOUTH EVERY 6 HOURS AS NEEDED FOR WHEEZING FOR SHORTNESS OF BREATH    Dispense:  9 g    Refill:  1   busPIRone  (BUSPAR ) 7.5 MG tablet    Sig: Take 1 tablet (7.5 mg total) by mouth 2 (two) times daily.    Dispense:  60 tablet    Refill:  2   cyanocobalamin  (VITAMIN B12) 1000 MCG/ML injection    Sig: Inject 1 mL (1,000 mcg total) into the muscle every 30 (thirty) days.    Dispense:  10 mL    Refill:  0   diclofenac  Sodium (VOLTAREN ) 1 % GEL    Sig: Apply 2 g topically daily as needed.    Dispense:  100 g    Refill:  2   DISCONTD: DULoxetine  (CYMBALTA ) 30 MG capsule    Sig: Take 1 cap by mouth once daily for 3 days, then increase to 2 caps once daily on day 4.    Dispense:  60 capsule    Refill:  0   lisinopril  (ZESTRIL ) 30 MG tablet    Sig: Take 1 tablet (30 mg total) by mouth daily.    Dispense:  90 tablet    Refill:  0   NEEDLE,  DISP, 25 G (B-D DISP NEEDLE 25GX1) 25G X 1 MISC    Sig: For B12 injections    Dispense:  50 each    Refill:  1   SYRINGE-NEEDLE, DISP, 3 ML 23G X 1 3 ML MISC    Sig: For B12 injections    Dispense:  50 each    Refill:  1   ondansetron  (ZOFRAN ) 4 MG tablet    Sig: Take 1 tablet (4 mg total) by mouth every 8 (eight) hours as needed for nausea or vomiting.    Dispense:  20 tablet    Refill:  0    Supervising Provider:   DOMENICA BLACKBIRD A [[id number]]   DULoxetine  (CYMBALTA ) 60 MG capsule    Sig: Take 1 capsule (60 mg total) by mouth daily.    Dispense:  90 capsule    Refill:  1    Supervising Provider:   DOMENICA BLACKBIRD A [[id number]]

## 2023-04-08 NOTE — Assessment & Plan Note (Signed)
  Transient right-sided tingling Likely due to anxiety, but given the patient's history of hypertension, it is important to rule out a cerebrovascular event. -Order CT scan of the brain to rule out stroke. Patient reports recent falls and balance issues. -Monitor closely, consider referral to physical therapy if symptoms persist.

## 2023-04-08 NOTE — Assessment & Plan Note (Signed)
 Initially she stated blood was coming from the vagina, but upon further questioning, it sounds more like she was having hematuria with bloody urinary incontinence.  Normal vaginal exam, s/p hysterectomy. Will obtain urinalysis to evaluate for microscopic hematuria.

## 2023-04-08 NOTE — Assessment & Plan Note (Signed)
 Likely secondary to Mounjaro , will rx with zofran  PRN.

## 2023-04-09 ENCOUNTER — Other Ambulatory Visit: Payer: Self-pay | Admitting: Nurse Practitioner

## 2023-04-09 ENCOUNTER — Encounter: Payer: Self-pay | Admitting: Nurse Practitioner

## 2023-04-09 ENCOUNTER — Ambulatory Visit: Payer: 59 | Admitting: Orthopaedic Surgery

## 2023-04-09 LAB — URINALYSIS, ROUTINE W REFLEX MICROSCOPIC
Bilirubin Urine: NEGATIVE
Ketones, ur: NEGATIVE
Nitrite: POSITIVE — AB
Specific Gravity, Urine: 1.02 (ref 1.000–1.030)
Urine Glucose: NEGATIVE
Urobilinogen, UA: 1 (ref 0.0–1.0)
pH: 6 (ref 5.0–8.0)

## 2023-04-09 LAB — VITAMIN D 25 HYDROXY (VIT D DEFICIENCY, FRACTURES): VITD: 17.08 ng/mL — ABNORMAL LOW (ref 30.00–100.00)

## 2023-04-09 MED ORDER — NITROFURANTOIN MONOHYD MACRO 100 MG PO CAPS: 100.0000 mg | ORAL_CAPSULE | Freq: Two times a day (BID) | ORAL | 0 refills | Status: DC

## 2023-04-09 MED ORDER — VITAMIN D (ERGOCALCIFEROL) 1.25 MG (50000 UNIT) PO CAPS: 50000.0000 [IU] | ORAL_CAPSULE | ORAL | 0 refills | Status: DC

## 2023-04-16 ENCOUNTER — Ambulatory Visit: Payer: 59 | Admitting: Physical Medicine and Rehabilitation

## 2023-04-23 ENCOUNTER — Ambulatory Visit: Payer: 59 | Admitting: Physical Medicine and Rehabilitation

## 2023-05-01 ENCOUNTER — Ambulatory Visit (HOSPITAL_BASED_OUTPATIENT_CLINIC_OR_DEPARTMENT_OTHER): Payer: 59 | Attending: Family

## 2023-05-05 DIAGNOSIS — Z0000 Encounter for general adult medical examination without abnormal findings: Secondary | ICD-10-CM | POA: Diagnosis not present

## 2023-05-05 DIAGNOSIS — M170 Bilateral primary osteoarthritis of knee: Secondary | ICD-10-CM | POA: Diagnosis not present

## 2023-05-05 DIAGNOSIS — N183 Chronic kidney disease, stage 3 unspecified: Secondary | ICD-10-CM | POA: Diagnosis not present

## 2023-05-05 DIAGNOSIS — E559 Vitamin D deficiency, unspecified: Secondary | ICD-10-CM | POA: Diagnosis not present

## 2023-05-05 DIAGNOSIS — N1831 Chronic kidney disease, stage 3a: Secondary | ICD-10-CM | POA: Diagnosis not present

## 2023-05-05 DIAGNOSIS — Z79899 Other long term (current) drug therapy: Secondary | ICD-10-CM | POA: Diagnosis not present

## 2023-05-06 ENCOUNTER — Ambulatory Visit (HOSPITAL_BASED_OUTPATIENT_CLINIC_OR_DEPARTMENT_OTHER)
Admission: RE | Admit: 2023-05-06 | Discharge: 2023-05-06 | Disposition: A | Discharge status: Home / Self Care | Source: Ambulatory Visit | Attending: Family | Admitting: Family

## 2023-05-06 DIAGNOSIS — R200 Anesthesia of skin: Secondary | ICD-10-CM | POA: Diagnosis not present

## 2023-05-06 DIAGNOSIS — G459 Transient cerebral ischemic attack, unspecified: Secondary | ICD-10-CM | POA: Diagnosis not present

## 2023-05-06 DIAGNOSIS — R202 Paresthesia of skin: Secondary | ICD-10-CM | POA: Diagnosis not present

## 2023-05-06 DIAGNOSIS — R9082 White matter disease, unspecified: Secondary | ICD-10-CM | POA: Diagnosis not present

## 2023-05-07 DIAGNOSIS — G4733 Obstructive sleep apnea (adult) (pediatric): Secondary | ICD-10-CM | POA: Diagnosis not present

## 2023-05-15 ENCOUNTER — Encounter: Payer: Self-pay | Admitting: Family

## 2023-05-19 ENCOUNTER — Telehealth: Payer: Self-pay | Admitting: Family

## 2023-05-19 NOTE — Telephone Encounter (Signed)
 See mychart.

## 2023-05-20 NOTE — Progress Notes (Unsigned)
 Office Visit Note   Patient: Hailey Vance           Date of Birth: 1966/01/08           MRN: [id number] Visit Date: 05/21/2023              Requested by:  Sandford Craze, NP [id number] [place],  [place] [address]  PCP: Sandford Craze, NP   Assessment & Plan: Visit Diagnoses: No diagnosis found.  Plan: ***  Follow-Up Instructions: No follow-ups on file.   Orders:  No orders of the defined types were placed in this encounter. No orders of the defined types were placed in this encounter.    Procedures: No procedures performed   Clinical Data: No additional findings.   Subjective: No chief complaint on file.  HPI  Review of Systems   Objective: Vital Signs: LMP 10/02/2007   Physical Exam  Ortho Exam  Specialty Comments:  CLINICAL DATA:  Lumbar radiculopathy. Bilateral leg weakness. Lumbar radiculopathy, symptoms persist with greater than 6 weeks treatment. Lumbar radiculopathy and bilateral lower extremity weakness. Additional history provided by scanning technologist: Patient reports lower extremity weakness and ongoing radicular symptoms.   EXAM: MRI LUMBAR SPINE WITHOUT CONTRAST   TECHNIQUE: Multiplanar, multisequence MR imaging of the lumbar spine was performed. No intravenous contrast was administered.   COMPARISON:  Prior lumbar spine radiographs 08/22/2020 and earlier. Lumbar spine MRI 06/29/2013.   FINDINGS: Segmentation: 5 lumbar vertebrae. The caudal most well-formed intervertebral disc space is designated L5-S1.   Alignment: Trace L4-L5 grade 1 anterolisthesis, new from the prior MRI.   Vertebrae: Vertebral body height is maintained. No significant marrow edema or focal suspicious osseous lesion.   Conus medullaris and cauda equina: Conus extends to the L2 level. No signal abnormality within the visualized distal spinal cord.   Paraspinal and other soft tissues: Multiple large bilateral  renal cysts, incompletely imaged and incompletely assessed. Paraspinal soft tissues unremarkable.   Disc levels:   Unless otherwise stated, the level by level findings below have not significantly changed from the prior MRI of 06/29/2013.   Mild disc degeneration at L3-L4, L4-L5 and L5-S1, slightly progressed.   T11-T12: This level is imaged in the sagittal plane only. Slight disc bulge. Mild facet arthrosis and ligamentum flavum hypertrophy. No significant spinal canal or foraminal stenosis.   T12-L1: Slight disc bulge. Mild facet arthrosis. No significant spinal canal or foraminal stenosis.   L1-L2: Minimal facet and ligamentum flavum hypertrophy. No significant disc herniation or stenosis.   L2-L3: Mild facet arthrosis and ligamentum flavum hypertrophy. No significant disc herniation or stenosis.   L3-L4: Small disc bulge. Progressive moderate facet arthrosis with ligamentum flavum hypertrophy. Progressive mild epidural lipomatosis. Progressive mild narrowing of the spinal canal. No significant foraminal stenosis.   L4-L5: Trace grade 1 anterolisthesis, new from the prior exam. Slight disc uncovering. Moderate bilateral facet arthrosis with ligamentum flavum hypertrophy. Mild epidural lipomatosis. No significant spinal canal stenosis. Minimal relative narrowing of the left neural foramen.   L5-S1: Mild-to-moderate facet arthrosis with ligamentum flavum hypertrophy. No significant disc herniation or stenosis.   IMPRESSION: Lumbar and lower thoracic spondylosis, and mild epidural lipomatosis, as described and slightly progressed at multiple levels since the prior MRI of 06/29/2013. No more than mild spinal canal or neural foraminal narrowing. Mild multilevel disc degeneration, greatest at L3-L4, L3-L4 and L4-L5.   Trace L4-L5 grade 1 anterolisthesis, new from the prior MRI.   Multiple incompletely imaged large bilateral  renal cysts.     Electronically Signed   By:  Jackey Loge D.O.   On: 06/19/2021 09:48  Imaging: No results found.   PMFS History: Patient Active Problem List   Diagnosis Date Noted  . Cervical radiculopathy 04/08/2023  . Nausea 04/08/2023  . Numbness and tingling 04/08/2023  . Hematuria 04/08/2023  . Fibromyalgia 12/31/2022  . Gastroesophageal reflux disease 05/14/2022  . History of kidney stones 05/14/2022  . Renal cyst 06/26/2021  . Left shoulder pain 09/12/2020  . Primary osteoarthritis, left shoulder 09/11/2020  . Dysphagia 08/23/2020  . B12 deficiency 08/22/2020  . Hyperlipidemia 08/22/2020  . Moderate episode of recurrent major depressive disorder (HCC) 08/22/2020  . Major depressive disorder   . Osteoarthritis of both hips 06/15/2019  . Osteoarthritis of knees, bilateral 03/29/2019  . Morbid obesity (HCC) 02/15/2018  . Lipoma of left shoulder 12/02/2016  . BMI 45.0-49.9, adult (HCC)   . Preventative health care 10/10/2014  . Essential hypertension 09/06/2014  . Osteoarthritis 09/06/2014  . Post-menopausal bleeding 09/06/2014  . OSA (obstructive sleep apnea) 09/06/2014  . Generalized anxiety disorder 09/06/2014  . Vitamin D deficiency 09/06/2014  . Chronic back pain    Past Medical History:  Diagnosis Date  . Bilateral primary osteoarthritis of knee 03/29/2019  . Chronic back pain   . Cystocele with rectocele   . Generalized anxiety disorder   . GERD (gastroesophageal reflux disease)   . Headache    otc med prn  . Heart murmur    echo in epic 11-26-2017  normal w/ ef 55-60%  . History of chest pain    pt referred for atyical chest pain , cardiology evaluation w/ dr berry note in epic 10-25-2014, stated with low risk w/ no ischemia , nuclear ef 70% , done 09-19-2014 in epic,  non-cardiac chest pain  . HTN (hypertension)    followed by pcp    (normal nuclear stress test 09-19-2014 in epic)  . Low blood potassium   . Major depressive disorder   . OSA (obstructive sleep apnea)    04/2015 study in e-  Severe OSA (AHI = 55.0/hour);   (10-06-2019  per pt has not used cpap since 2019 after gastric bypass)  . Osteoarthritis of both hips 06/15/2019  . Plantar fasciitis of left foot   . S/P gastric bypass 02/15/2018   sleeve  . SUI (stress urinary incontinence, female)   . Vitamin D deficiency 09/06/2014  . Wears glasses     Family History  Problem Relation Age of Onset  . Lung cancer Mother 72       dec  . Cancer Mother   . Hypertension Mother   . Arthritis Mother   . Cancer Father 5       prostate  . Hypertension Father   . Leukemia Sister   . Hypertension Sister   . Arthritis Sister   . Hyperlipidemia Sister   . Hypertension Brother   . Arthritis Brother   . Multiple sclerosis Sister   . Hypertension Sister   . Hypertension Sister   . Hypertension Brother   . Hypertension Brother   . Breast cancer Paternal Aunt   . Alzheimer's disease Paternal Aunt   . Diabetes Maternal Grandmother   . Stroke Maternal Grandmother   . Heart attack Paternal Grandmother   . Asthma Other     Past Surgical History:  Procedure Laterality Date  . BLADDER SUSPENSION N/A 10/12/2019   Procedure: TRANSVAGINAL TAPE (TVT) PROCEDURE;  Surgeon: Carrington Clamp, MD;  Location: [facility];  Service: Gynecology;  Laterality: N/A;  . CESAREAN SECTION W/BTL  1990  . COLONOSCOPY WITH PROPOFOL N/A 08/30/2015   Procedure: COLONOSCOPY WITH PROPOFOL;  Surgeon: Napoleon Form, MD;  Location: [facility];  Service: Endoscopy;  Laterality: N/A;  . CYSTOCELE REPAIR N/A 10/12/2019   Procedure: ANTERIOR (CYSTOCELE);  Surgeon: Carrington Clamp, MD;  Location: [facility];  Service: Gynecology;  Laterality: N/A;  . CYSTOSCOPY N/A 05/23/2015   Procedure: CYSTOSCOPY;  Surgeon: Carrington Clamp, MD;  Location: [facility];  Service: Gynecology;  Laterality: N/A;  . CYSTOSCOPY N/A 10/12/2019   Procedure: CYSTOSCOPY;  Surgeon: Carrington Clamp, MD;  Location: [facility];   Service: Gynecology;  Laterality: N/A;  . HIATAL HERNIA REPAIR N/A 02/15/2018   Procedure: HERNIA REPAIR HIATAL;  Surgeon: Glenna Fellows, MD;  Location: [facility];  Service: General;  Laterality: N/A;  . HYSTEROSCOPY WITH D & C N/A 01/11/2015   Procedure: DILATATION AND CURETTAGE /HYSTEROSCOPY;  Surgeon: Marlow Baars, MD;  Location: [facility];  Service: Gynecology;  Laterality: N/A;  . KNEE ARTHROSCOPY Right 2010  . LAPAROSCOPIC GASTRIC SLEEVE RESECTION N/A 02/15/2018   Procedure: LAPAROSCOPIC GASTRIC SLEEVE RESECTION WITH UPPER ENDO AND ERAS PATHWAY;  Surgeon: Glenna Fellows, MD;  Location: [facility];  Service: General;  Laterality: N/A;  . LAPAROSCOPIC OOPHORECTOMY Bilateral 2004   LEFT OOPHORECTOMY AND PARTIAL RIGHT OOPHORECTOMY  . LIPOMA EXCISION Left 12/02/2016   Procedure: EXCISION LIPOMA OF LEFT SHOULDER;  Surgeon: Valeria Batman, MD;  Location: [facility];  Service: Orthopedics;  Laterality: Left;  . ROBOTIC ASSISTED TOTAL HYSTERECTOMY WITH SALPINGECTOMY Bilateral 05/23/2015   Procedure: ROBOTIC ASSISTED TOTAL HYSTERECTOMY WITH SALPINGECTOMYwith right oophorectomy;  Surgeon: Carrington Clamp, MD;  Location: [facility];  Service: Gynecology;  Laterality: Bilateral;  . UMBILICAL HERNIA REPAIR  2002   Social History   Occupational History  . Occupation: Disability  Tobacco Use  . Smoking status: Never  . Smokeless tobacco: Never  Vaping Use  . Vaping status: Never Used  Substance and Sexual Activity  . Alcohol use: No    Alcohol/week: 0.0 standard drinks of alcohol  . Drug use: Never  . Sexual activity: Not on file    Comment: BTL w/ C/S in 1990

## 2023-05-21 ENCOUNTER — Other Ambulatory Visit (INDEPENDENT_AMBULATORY_CARE_PROVIDER_SITE_OTHER): Payer: Self-pay

## 2023-05-21 ENCOUNTER — Encounter: Payer: Self-pay | Admitting: Orthopaedic Surgery

## 2023-05-21 ENCOUNTER — Ambulatory Visit: Admitting: Orthopaedic Surgery

## 2023-05-21 DIAGNOSIS — M170 Bilateral primary osteoarthritis of knee: Secondary | ICD-10-CM | POA: Diagnosis not present

## 2023-05-21 DIAGNOSIS — M542 Cervicalgia: Secondary | ICD-10-CM

## 2023-05-21 MED ORDER — LIDOCAINE HCL 1 % IJ SOLN
2.0000 mL | INTRAMUSCULAR | Status: AC | PRN
  Administered 2023-05-21: 2 mL

## 2023-05-21 MED ORDER — PREDNISONE 10 MG (21) PO TBPK: ORAL_TABLET | ORAL | 3 refills | Status: DC

## 2023-05-21 MED ORDER — METHYLPREDNISOLONE ACETATE 40 MG/ML IJ SUSP
40.0000 mg | INTRAMUSCULAR | Status: AC | PRN
  Administered 2023-05-21: 40 mg via INTRA_ARTICULAR

## 2023-05-21 MED ORDER — BUPIVACAINE HCL 0.5 % IJ SOLN
2.0000 mL | INTRAMUSCULAR | Status: AC | PRN
  Administered 2023-05-21: 2 mL via INTRA_ARTICULAR

## 2023-06-02 DIAGNOSIS — Z9181 History of falling: Secondary | ICD-10-CM | POA: Diagnosis not present

## 2023-06-02 DIAGNOSIS — Z780 Asymptomatic menopausal state: Secondary | ICD-10-CM | POA: Diagnosis not present

## 2023-06-02 DIAGNOSIS — E559 Vitamin D deficiency, unspecified: Secondary | ICD-10-CM | POA: Diagnosis not present

## 2023-06-02 DIAGNOSIS — Z79899 Other long term (current) drug therapy: Secondary | ICD-10-CM | POA: Diagnosis not present

## 2023-06-02 DIAGNOSIS — N1831 Chronic kidney disease, stage 3a: Secondary | ICD-10-CM | POA: Diagnosis not present

## 2023-06-02 DIAGNOSIS — M170 Bilateral primary osteoarthritis of knee: Secondary | ICD-10-CM | POA: Diagnosis not present

## 2023-06-04 DIAGNOSIS — Z79899 Other long term (current) drug therapy: Secondary | ICD-10-CM | POA: Diagnosis not present

## 2023-06-07 DIAGNOSIS — G4733 Obstructive sleep apnea (adult) (pediatric): Secondary | ICD-10-CM | POA: Diagnosis not present

## 2023-06-08 ENCOUNTER — Ambulatory Visit: Admitting: Rehabilitative and Restorative Service Providers"

## 2023-07-03 DIAGNOSIS — N1831 Chronic kidney disease, stage 3a: Secondary | ICD-10-CM | POA: Diagnosis not present

## 2023-07-03 DIAGNOSIS — M129 Arthropathy, unspecified: Secondary | ICD-10-CM | POA: Diagnosis not present

## 2023-07-03 DIAGNOSIS — I10 Essential (primary) hypertension: Secondary | ICD-10-CM | POA: Diagnosis not present

## 2023-07-03 DIAGNOSIS — E559 Vitamin D deficiency, unspecified: Secondary | ICD-10-CM | POA: Diagnosis not present

## 2023-07-03 DIAGNOSIS — Z79899 Other long term (current) drug therapy: Secondary | ICD-10-CM | POA: Diagnosis not present

## 2023-07-03 DIAGNOSIS — Z9181 History of falling: Secondary | ICD-10-CM | POA: Diagnosis not present

## 2023-07-03 DIAGNOSIS — G629 Polyneuropathy, unspecified: Secondary | ICD-10-CM | POA: Diagnosis not present

## 2023-07-03 DIAGNOSIS — M170 Bilateral primary osteoarthritis of knee: Secondary | ICD-10-CM | POA: Diagnosis not present

## 2023-07-07 ENCOUNTER — Ambulatory Visit: Payer: 59 | Admitting: Family

## 2023-07-07 DIAGNOSIS — Z79899 Other long term (current) drug therapy: Secondary | ICD-10-CM | POA: Diagnosis not present

## 2023-07-07 DIAGNOSIS — G4733 Obstructive sleep apnea (adult) (pediatric): Secondary | ICD-10-CM | POA: Diagnosis not present

## 2023-07-14 ENCOUNTER — Other Ambulatory Visit: Payer: Self-pay | Admitting: Family

## 2023-07-31 DIAGNOSIS — M170 Bilateral primary osteoarthritis of knee: Secondary | ICD-10-CM | POA: Diagnosis not present

## 2023-07-31 DIAGNOSIS — Z780 Asymptomatic menopausal state: Secondary | ICD-10-CM | POA: Diagnosis not present

## 2023-07-31 DIAGNOSIS — G629 Polyneuropathy, unspecified: Secondary | ICD-10-CM | POA: Diagnosis not present

## 2023-07-31 DIAGNOSIS — E559 Vitamin D deficiency, unspecified: Secondary | ICD-10-CM | POA: Diagnosis not present

## 2023-07-31 DIAGNOSIS — Z1231 Encounter for screening mammogram for malignant neoplasm of breast: Secondary | ICD-10-CM | POA: Diagnosis not present

## 2023-07-31 DIAGNOSIS — N1831 Chronic kidney disease, stage 3a: Secondary | ICD-10-CM | POA: Diagnosis not present

## 2023-07-31 DIAGNOSIS — Z79899 Other long term (current) drug therapy: Secondary | ICD-10-CM | POA: Diagnosis not present

## 2023-07-31 DIAGNOSIS — Z9181 History of falling: Secondary | ICD-10-CM | POA: Diagnosis not present

## 2023-08-04 DIAGNOSIS — Z79899 Other long term (current) drug therapy: Secondary | ICD-10-CM | POA: Diagnosis not present

## 2023-08-10 ENCOUNTER — Other Ambulatory Visit: Payer: Self-pay | Admitting: Family

## 2023-08-16 ENCOUNTER — Telehealth: Admitting: Physician Assistant

## 2023-08-16 DIAGNOSIS — Z760 Encounter for issue of repeat prescription: Secondary | ICD-10-CM | POA: Diagnosis not present

## 2023-08-16 MED ORDER — LISINOPRIL 30 MG PO TABS: 30.0000 mg | ORAL_TABLET | Freq: Every day | ORAL | 0 refills | Status: DC

## 2023-08-16 MED ORDER — TIRZEPATIDE 12.5 MG/0.5ML ~~LOC~~ SOAJ: 12.5000 mg | SUBCUTANEOUS | 0 refills | Status: AC

## 2023-08-16 NOTE — Patient Instructions (Signed)
 Rosy Cooper, thank you for joining [name], PA-C for today's virtual visit.  While this provider is not your primary care provider (PCP), if your PCP is located in our provider database this encounter information will be shared with them immediately following your visit.   A [facility] MyChart account gives you access to today's visit and all your visits, tests, and labs performed at [facility]  click here if you don't have a [facility] MyChart account or go to [email or web address]  Consent: (Patient) Hailey Vance provided verbal consent for this virtual visit at the beginning of the encounter.  Current Medications:  Current Outpatient Medications:    nitrofurantoin , macrocrystal-monohydrate, (MACROBID ) 100 MG capsule, Take 1 capsule (100 mg total) by mouth 2 (two) times daily., Disp: 10 capsule, Rfl: 0   albuterol  (VENTOLIN  HFA) 108 (90 Base) MCG/ACT inhaler, INHALE 2 PUFFS BY MOUTH EVERY 6 HOURS AS NEEDED FOR WHEEZING FOR SHORTNESS OF BREATH, Disp: 9 g, Rfl: 1   amLODipine  (NORVASC ) 10 MG tablet, Take 1 tablet (10 mg total) by mouth daily., Disp: 90 tablet, Rfl: 1   Artificial Tear Ointment (DRY EYES OP), Apply to eye as needed., Disp: , Rfl:    buPROPion  (WELLBUTRIN  XL) 300 MG 24 hr tablet, TAKE 1 TABLET BY MOUTH ONCE DAILY . APPOINTMENT REQUIRED FOR FUTURE REFILLS, Disp: 90 tablet, Rfl: 1   busPIRone  (BUSPAR ) 7.5 MG tablet, Take 1 tablet (7.5 mg total) by mouth 2 (two) times daily., Disp: 60 tablet, Rfl: 2   Cholecalciferol (VITAMIN D3 GUMMIES PO), Take 2 tablets by mouth daily. Reports 5000 IU takes 2 gummies daily, Disp: , Rfl:    cyanocobalamin  (VITAMIN B12) 1000 MCG tablet, Take 1 tablet (1,000 mcg total) by mouth daily., Disp: , Rfl:    cyanocobalamin  (VITAMIN B12) 1000 MCG/ML injection, Inject 1 mL (1,000 mcg total) into the muscle every 30 (thirty) days., Disp: 10 mL, Rfl: 0   diclofenac  Sodium (VOLTAREN ) 1 % GEL, Apply 2 g topically daily  as needed., Disp: 100 g, Rfl: 2   DULoxetine  (CYMBALTA ) 60 MG capsule, Take 1 capsule (60 mg total) by mouth daily., Disp: 90 capsule, Rfl: 1   famotidine  (PEPCID ) 20 MG tablet, Take 1 tablet (20 mg total) by mouth at bedtime., Disp: 90 tablet, Rfl: 3   furosemide  (LASIX ) 20 MG tablet, Take 1 tablet (20 mg total) by mouth daily as needed for edema., Disp: 30 tablet, Rfl: 3   gabapentin  (NEURONTIN ) 300 MG capsule, Take 1 capsule (300 mg total) by mouth 3 (three) times daily., Disp: 270 capsule, Rfl: 0   lisinopril  (ZESTRIL ) 30 MG tablet, Take 1 tablet (30 mg total) by mouth daily., Disp: 90 tablet, Rfl: 0   metoprolol  succinate (TOPROL -XL) 100 MG 24 hr tablet, Take 1 tablet (100 mg total) by mouth daily. Take with or immediately following a meal., Disp: 90 tablet, Rfl: 3   multivitamin-iron-minerals-folic acid (CENTRUM) chewable tablet, Chew 1 tablet by mouth daily., Disp: , Rfl:    NEEDLE, DISP, 25 G (B-D DISP NEEDLE 25GX1) 25G X 1 MISC, For B12 injections, Disp: 50 each, Rfl: 1   nystatin  (MYCOSTATIN /NYSTOP ) powder, Apply 1 Application topically 2 (two) times daily., Disp: 60 g, Rfl: 2   omeprazole  (PRILOSEC) 40 MG capsule, Take 1 capsule (40 mg total) by mouth daily. Daily 30 minutes before breakfast, Disp: 90 capsule, Rfl: 3   ondansetron  (ZOFRAN ) 4 MG tablet, Take 1 tablet (4 mg total) by mouth every 8 (eight) hours as needed  for nausea or vomiting., Disp: 20 tablet, Rfl: 0   predniSONE  (STERAPRED UNI-PAK 21 TAB) 10 MG (21) TBPK tablet, Take as directed, Disp: 21 tablet, Rfl: 3   SYRINGE-NEEDLE, DISP, 3 ML 23G X 1 3 ML MISC, For B12 injections, Disp: 50 each, Rfl: 1   tirzepatide  (MOUNJARO ) 12.5 MG/0.5ML Pen, Inject 12.5 mg into the skin once a week., Disp: 6 mL, Rfl: 1   Vitamin D , Ergocalciferol , (DRISDOL ) 1.25 MG (50000 UNIT) CAPS capsule, Take 1 capsule (50,000 Units total) by mouth every 7 (seven) days., Disp: 4 capsule, Rfl: 0   Medications ordered in this encounter:  No orders of the  defined types were placed in this encounter.    *If you need refills on other medications prior to your next appointment, please contact your pharmacy*  Follow-Up: Call back or seek an in-person evaluation if the symptoms worsen or if the condition fails to improve as anticipated.  [facility] [phone]  Other Instructions A one-time refill of your medications has been sent in. Further refills are to come from your new primary care provider. Take care!   If you have been instructed to have an in-person evaluation today at a local Urgent Care facility, please use the link below. It will take you to a list of all of our available [facility], including address, phone number and hours of operation. Please do not delay care.  [facility]  If you or a family member do not have a primary care provider, use the link below to schedule a visit and establish care. When you choose a [facility] primary care physician or advanced practice provider, you gain a long-term partner in health. Find a Primary Care Provider  Learn more about [facility]'s in-office and virtual care options: [facility] - Get Care Now

## 2023-08-16 NOTE — Progress Notes (Signed)
 Virtual Visit Consent   Hailey Vance, you are scheduled for a virtual visit with a [facility] provider today. Just as with appointments in the office, your consent must be obtained to participate. Your consent will be active for this visit and any virtual visit you may have with one of our providers in the next 365 days. If you have a MyChart account, a copy of this consent can be sent to you electronically.  As this is a virtual visit, video technology does not allow for your provider to perform a traditional examination. This may limit your provider's ability to fully assess your condition. If your provider identifies any concerns that need to be evaluated in person or the need to arrange testing (such as labs, EKG, etc.), we will make arrangements to do so. Although advances in technology are sophisticated, we cannot ensure that it will always work on either your end or our end. If the connection with a video visit is poor, the visit may have to be switched to a telephone visit. With either a video or telephone visit, we are not always able to ensure that we have a secure connection.  By engaging in this virtual visit, you consent to the provision of healthcare and authorize for your insurance to be billed (if applicable) for the services provided during this visit. Depending on your insurance coverage, you may receive a charge related to this service.  I need to obtain your verbal consent now. Are you willing to proceed with your visit today? Hailey Vance has provided verbal consent on 08/16/2023 for a virtual visit (video or telephone). Hailey Vance, [place]  Date: 08/16/2023 11:05 AM   Virtual Visit via Video Note   I, Hailey Vance, connected with  Hailey Vance  ([id number], 01/26/1966) on 08/16/23 at 11:00 AM EDT by a video-enabled telemedicine application and verified that I am speaking with the correct person using two identifiers.  Location: Patient: Virtual  Visit Location Patient: Home Provider: Virtual Visit Location Provider: Home Office   I discussed the limitations of evaluation and management by telemedicine and the availability of in person appointments. The patient expressed understanding and agreed to proceed.    History of Present Illness: Hailey Vance is a 58 y.o. who identifies as a female who was assigned female at birth, and is being seen today for medication refills.  Patient recently dismissed by her primary care provider due to multiple no-shows.  Has an appointment scheduled with a new primary care provider, but the appointment is not until next month.  She is concerned as she is out of a couple of her medicines.  Patient endorses being out of her lisinopril  for about 2 weeks.  Still has her other blood pressure medicines. Patient denies chest pain, palpitations, lightheadedness, dizziness, vision changes or frequent headaches.  BP Readings from Last 3 Encounters:  04/08/23 130/74  12/30/22 (!) 122/90  10/22/22 (!) 138/90   Lab Results  Component Value Date   CREATININE 1.10 05/26/2022   Patient also needing a refill of her Mounjaro .  She has been off of it for just 1 week.  Had no issue tolerating the medication, but does not want to have to restart at lower dose due to being off of current dose for several weeks if she has to wait for her new patient appointment next month.   HPI: HPI  Problems:  Patient Active Problem List   Diagnosis Date Noted   Cervical radiculopathy  04/08/2023   Nausea 04/08/2023   Numbness and tingling 04/08/2023   Hematuria 04/08/2023   Fibromyalgia 12/31/2022   Gastroesophageal reflux disease 05/14/2022   History of kidney stones 05/14/2022   Renal cyst 06/26/2021   Left shoulder pain 09/12/2020   Primary osteoarthritis, left shoulder 09/11/2020   Dysphagia 08/23/2020   B12 deficiency 08/22/2020   Hyperlipidemia 08/22/2020   Moderate episode of recurrent major depressive disorder  (HCC) 08/22/2020   Major depressive disorder    Osteoarthritis of both hips 06/15/2019   Osteoarthritis of knees, bilateral 03/29/2019   Morbid obesity (HCC) 02/15/2018   Lipoma of left shoulder 12/02/2016   BMI 45.0-49.9, adult [facility])    Preventative health care 10/10/2014   Essential hypertension 09/06/2014   Osteoarthritis 09/06/2014   Post-menopausal bleeding 09/06/2014   OSA (obstructive sleep apnea) 09/06/2014   Generalized anxiety disorder 09/06/2014   Vitamin D  deficiency 09/06/2014   Chronic back pain     Allergies:  Allergies  Allergen Reactions   Penicillins Itching and Other (See Comments)    Has patient had a PCN reaction causing immediate rash, facial/tongue/throat swelling, SOB or lightheadedness with hypotension: No Has patient had a PCN reaction causing severe rash involving mucus membranes or skin necrosis: No Has patient had a PCN reaction that required hospitalization No Has patient had a PCN reaction occurring within the last 10 years: No If all of the above answers are NO, then may proceed with Cephalosporin use.    Medications:  Current Outpatient Medications:    nitrofurantoin , macrocrystal-monohydrate, (MACROBID ) 100 MG capsule, Take 1 capsule (100 mg total) by mouth 2 (two) times daily., Disp: 10 capsule, Rfl: 0   albuterol  (VENTOLIN  HFA) 108 (90 Base) MCG/ACT inhaler, INHALE 2 PUFFS BY MOUTH EVERY 6 HOURS AS NEEDED FOR WHEEZING FOR SHORTNESS OF BREATH, Disp: 9 g, Rfl: 1   amLODipine  (NORVASC ) 10 MG tablet, Take 1 tablet (10 mg total) by mouth daily., Disp: 90 tablet, Rfl: 1   Artificial Tear Ointment (DRY EYES OP), Apply to eye as needed., Disp: , Rfl:    buPROPion  (WELLBUTRIN  XL) 300 MG 24 hr tablet, TAKE 1 TABLET BY MOUTH ONCE DAILY . APPOINTMENT REQUIRED FOR FUTURE REFILLS, Disp: 90 tablet, Rfl: 1   busPIRone  (BUSPAR ) 7.5 MG tablet, Take 1 tablet (7.5 mg total) by mouth 2 (two) times daily., Disp: 60 tablet, Rfl: 2   Cholecalciferol (VITAMIN D3  GUMMIES PO), Take 2 tablets by mouth daily. Reports 5000 IU takes 2 gummies daily, Disp: , Rfl:    cyanocobalamin  (VITAMIN B12) 1000 MCG tablet, Take 1 tablet (1,000 mcg total) by mouth daily., Disp: , Rfl:    cyanocobalamin  (VITAMIN B12) 1000 MCG/ML injection, Inject 1 mL (1,000 mcg total) into the muscle every 30 (thirty) days., Disp: 10 mL, Rfl: 0   diclofenac  Sodium (VOLTAREN ) 1 % GEL, Apply 2 g topically daily as needed., Disp: 100 g, Rfl: 2   DULoxetine  (CYMBALTA ) 60 MG capsule, Take 1 capsule (60 mg total) by mouth daily., Disp: 90 capsule, Rfl: 1   famotidine  (PEPCID ) 20 MG tablet, Take 1 tablet (20 mg total) by mouth at bedtime., Disp: 90 tablet, Rfl: 3   furosemide  (LASIX ) 20 MG tablet, Take 1 tablet (20 mg total) by mouth daily as needed for edema., Disp: 30 tablet, Rfl: 3   gabapentin  (NEURONTIN ) 300 MG capsule, Take 1 capsule (300 mg total) by mouth 3 (three) times daily., Disp: 270 capsule, Rfl: 0   lisinopril  (ZESTRIL ) 30 MG tablet, Take 1 tablet (30  mg total) by mouth daily., Disp: 90 tablet, Rfl: 0   metoprolol  succinate (TOPROL -XL) 100 MG 24 hr tablet, Take 1 tablet (100 mg total) by mouth daily. Take with or immediately following a meal., Disp: 90 tablet, Rfl: 3   multivitamin-iron-minerals-folic acid (CENTRUM) chewable tablet, Chew 1 tablet by mouth daily., Disp: , Rfl:    NEEDLE, DISP, 25 G (B-D DISP NEEDLE 25GX1) 25G X 1 MISC, For B12 injections, Disp: 50 each, Rfl: 1   nystatin  (MYCOSTATIN /NYSTOP ) powder, Apply 1 Application topically 2 (two) times daily., Disp: 60 g, Rfl: 2   omeprazole  (PRILOSEC) 40 MG capsule, Take 1 capsule (40 mg total) by mouth daily. Daily 30 minutes before breakfast, Disp: 90 capsule, Rfl: 3   ondansetron  (ZOFRAN ) 4 MG tablet, Take 1 tablet (4 mg total) by mouth every 8 (eight) hours as needed for nausea or vomiting., Disp: 20 tablet, Rfl: 0   predniSONE  (STERAPRED UNI-PAK 21 TAB) 10 MG (21) TBPK tablet, Take as directed, Disp: 21 tablet, Rfl: 3    SYRINGE-NEEDLE, DISP, 3 ML 23G X 1 3 ML MISC, For B12 injections, Disp: 50 each, Rfl: 1   tirzepatide  (MOUNJARO ) 12.5 MG/0.5ML Pen, Inject 12.5 mg into the skin once a week., Disp: 3 mL, Rfl: 0   Vitamin D , Ergocalciferol , (DRISDOL ) 1.25 MG (50000 UNIT) CAPS capsule, Take 1 capsule (50,000 Units total) by mouth every 7 (seven) days., Disp: 4 capsule, Rfl: 0  Observations/Objective: Patient is well-developed, well-nourished in no acute distress.  Resting comfortably  at home.  Head is normocephalic, atraumatic.  No labored breathing.  Speech is clear and coherent with logical content.  Patient is alert and oriented at baseline.   Assessment and Plan: 1. Encounter for medication refill (Primary)  2. Morbid obesity (HCC) - tirzepatide  (MOUNJARO ) 12.5 MG/0.5ML Pen; Inject 12.5 mg into the skin once a week.  Dispense: 3 mL; Refill: 0  One-time refill of medication sent in.  Follow-up with new PCP as scheduled.  Follow Up Instructions: I discussed the assessment and treatment plan with the patient. The patient was provided an opportunity to ask questions and all were answered. The patient agreed with the plan and demonstrated an understanding of the instructions.  A copy of instructions were sent to the patient via MyChart unless otherwise noted below.   The patient was advised to call back or seek an in-person evaluation if the symptoms worsen or if the condition fails to improve as anticipated.    Hailey Maillard, PA-C

## 2023-08-17 DIAGNOSIS — G4733 Obstructive sleep apnea (adult) (pediatric): Secondary | ICD-10-CM | POA: Diagnosis not present

## 2023-08-31 DIAGNOSIS — Z79899 Other long term (current) drug therapy: Secondary | ICD-10-CM | POA: Diagnosis not present

## 2023-08-31 DIAGNOSIS — N1831 Chronic kidney disease, stage 3a: Secondary | ICD-10-CM | POA: Diagnosis not present

## 2023-08-31 DIAGNOSIS — Z9181 History of falling: Secondary | ICD-10-CM | POA: Diagnosis not present

## 2023-08-31 DIAGNOSIS — G629 Polyneuropathy, unspecified: Secondary | ICD-10-CM | POA: Diagnosis not present

## 2023-08-31 DIAGNOSIS — E559 Vitamin D deficiency, unspecified: Secondary | ICD-10-CM | POA: Diagnosis not present

## 2023-08-31 DIAGNOSIS — M170 Bilateral primary osteoarthritis of knee: Secondary | ICD-10-CM | POA: Diagnosis not present

## 2023-09-02 DIAGNOSIS — Z79899 Other long term (current) drug therapy: Secondary | ICD-10-CM | POA: Diagnosis not present

## 2023-09-07 DIAGNOSIS — N390 Urinary tract infection, site not specified: Secondary | ICD-10-CM | POA: Diagnosis not present

## 2023-09-07 DIAGNOSIS — E559 Vitamin D deficiency, unspecified: Secondary | ICD-10-CM | POA: Diagnosis not present

## 2023-09-07 DIAGNOSIS — E88819 Insulin resistance, unspecified: Secondary | ICD-10-CM | POA: Diagnosis not present

## 2023-09-07 DIAGNOSIS — N3001 Acute cystitis with hematuria: Secondary | ICD-10-CM | POA: Diagnosis not present

## 2023-09-07 DIAGNOSIS — R5383 Other fatigue: Secondary | ICD-10-CM | POA: Diagnosis not present

## 2023-09-07 DIAGNOSIS — E7800 Pure hypercholesterolemia, unspecified: Secondary | ICD-10-CM | POA: Diagnosis not present

## 2023-09-07 DIAGNOSIS — D539 Nutritional anemia, unspecified: Secondary | ICD-10-CM | POA: Diagnosis not present

## 2023-09-07 DIAGNOSIS — Z1159 Encounter for screening for other viral diseases: Secondary | ICD-10-CM | POA: Diagnosis not present

## 2023-09-07 DIAGNOSIS — R0602 Shortness of breath: Secondary | ICD-10-CM | POA: Diagnosis not present

## 2023-09-07 DIAGNOSIS — Z1231 Encounter for screening mammogram for malignant neoplasm of breast: Secondary | ICD-10-CM | POA: Diagnosis not present

## 2023-09-07 DIAGNOSIS — Z79899 Other long term (current) drug therapy: Secondary | ICD-10-CM | POA: Diagnosis not present

## 2023-09-10 ENCOUNTER — Telehealth: Payer: Self-pay | Admitting: Gastroenterology

## 2023-09-10 NOTE — Telephone Encounter (Signed)
 Inbound call from patient, would like to schedule her endoscopy at the hospital. States she had to reschedule last year and would now like to schedule.

## 2023-09-10 NOTE — Telephone Encounter (Signed)
 Dr Shila this patient has not been seen in the office since October 2024. She wants to reschedule her hospital procedure. Don't you think she needs to be seen back in the office first? Or will a Pre-Op be ok

## 2023-09-14 NOTE — Telephone Encounter (Signed)
 Spoke with the patient and she is aware to come into the office first for an office appointment. She has scheduled for 10/13/2023 at 11:10 am

## 2023-09-14 NOTE — Telephone Encounter (Signed)
 Can you please bring her in for a office visit with either me or APP to make sure there are not any significant changes in her medical history given over 6 months

## 2023-09-16 DIAGNOSIS — G4733 Obstructive sleep apnea (adult) (pediatric): Secondary | ICD-10-CM | POA: Diagnosis not present

## 2023-09-18 ENCOUNTER — Ambulatory Visit: Admitting: Orthopaedic Surgery

## 2023-09-18 VITALS — Ht 63.0 in | Wt 261.0 lb

## 2023-09-18 DIAGNOSIS — M170 Bilateral primary osteoarthritis of knee: Secondary | ICD-10-CM

## 2023-09-18 DIAGNOSIS — M1711 Unilateral primary osteoarthritis, right knee: Secondary | ICD-10-CM

## 2023-09-18 DIAGNOSIS — M1712 Unilateral primary osteoarthritis, left knee: Secondary | ICD-10-CM

## 2023-09-18 MED ORDER — BUPIVACAINE HCL 0.5 % IJ SOLN
2.0000 mL | INTRAMUSCULAR | Status: AC | PRN
  Administered 2023-09-18: 2 mL via INTRA_ARTICULAR

## 2023-09-18 MED ORDER — LIDOCAINE HCL 1 % IJ SOLN
2.0000 mL | INTRAMUSCULAR | Status: AC | PRN
  Administered 2023-09-18: 2 mL

## 2023-09-18 MED ORDER — METHYLPREDNISOLONE ACETATE 40 MG/ML IJ SUSP
40.0000 mg | INTRAMUSCULAR | Status: AC | PRN
  Administered 2023-09-18: 40 mg via INTRA_ARTICULAR

## 2023-09-18 NOTE — Progress Notes (Signed)
 Office Visit Note   Patient: Hailey Vance           Date of Birth: 11/11/1965           MRN: [id number] Visit Date: 09/18/2023              Requested by: No referring provider defined for this encounter. PCP: No primary care provider on file.   Assessment & Plan: Visit Diagnoses:  1. Primary osteoarthritis of left knee   2. Primary osteoarthritis of right knee     Plan: History of Present Illness Hailey Vance is a 58 year old female who presents with knee pain for follow-up and cortisone injections.  She experiences knee pain and received cortisone injections four months ago, which provided relief for a couple of months. She has a history of knee surgery due to severe pain that affected her mobility. She is currently on Mounjaro , which has reduced her appetite, and she has lost weight from a previous high of 363 pounds. Exercise remains painful.  Bilateral knee exams are unchanged.  Assessment and Plan Chronic bilateral knee pain due to severe OA - Administer cortisone injections today  Obesity Obesity exacerbates knee pain and complicates weight management. Emphasized lifestyle changes. Discussed medication switch and weight loss surgery as options. Successful knee surgery may be compromised by excess weight. - Consider referral to a weight loss clinic for structured support.  Follow-Up Instructions: No follow-ups on file.   Orders:  Orders Placed This Encounter  Procedures   Large Joint Inj: bilateral knee   No orders of the defined types were placed in this encounter.     Procedures: Large Joint Inj: bilateral knee on 09/18/2023 9:22 AM Indications: pain Details: 22 G needle  Arthrogram: No  Medications (Right): 2 mL lidocaine  1 %; 2 mL bupivacaine  0.5 %; 40 mg methylPREDNISolone  acetate 40 MG/ML Medications (Left): 2 mL lidocaine  1 %; 2 mL bupivacaine  0.5 %; 40 mg methylPREDNISolone  acetate 40 MG/ML Outcome: tolerated well, no immediate  complications Patient was prepped and draped in the usual sterile fashion.       Clinical Data: No additional findings.   Subjective: Chief Complaint  Patient presents with   Right Knee - Pain   Left Knee - Pain    HPI  Review of Systems  Constitutional: Negative.   HENT: Negative.    Eyes: Negative.   Respiratory: Negative.    Cardiovascular: Negative.   Endocrine: Negative.   Musculoskeletal: Negative.   Neurological: Negative.   Hematological: Negative.   Psychiatric/Behavioral: Negative.    All other systems reviewed and are negative.    Objective: Vital Signs: Ht 5' 3 (1.6 m)   Wt 261 lb (118.4 kg)   LMP 10/02/2007   BMI 46.23 kg/m   Physical Exam Vitals and nursing note reviewed.  Constitutional:      Appearance: She is well-developed.  HENT:     Head: Atraumatic.     Nose: Nose normal.  Eyes:     Extraocular Movements: Extraocular movements intact.  Cardiovascular:     Pulses: Normal pulses.  Pulmonary:     Effort: Pulmonary effort is normal.  Abdominal:     Palpations: Abdomen is soft.  Musculoskeletal:     Cervical back: Neck supple.  Skin:    General: Skin is warm.     Capillary Refill: Capillary refill takes less than 2 seconds.  Neurological:     Mental Status: She is alert. Mental status is at baseline.  Psychiatric:        Behavior: Behavior normal.        Thought Content: Thought content normal.        Judgment: Judgment normal.     Ortho Exam  Specialty Comments:  CLINICAL DATA:  Lumbar radiculopathy. Bilateral leg weakness. Lumbar radiculopathy, symptoms persist with greater than 6 weeks treatment. Lumbar radiculopathy and bilateral lower extremity weakness. Additional history provided by scanning technologist: Patient reports lower extremity weakness and ongoing radicular symptoms.   EXAM: MRI LUMBAR SPINE WITHOUT CONTRAST   TECHNIQUE: Multiplanar, multisequence MR imaging of the lumbar spine was performed. No  intravenous contrast was administered.   COMPARISON:  Prior lumbar spine radiographs 08/22/2020 and earlier. Lumbar spine MRI 06/29/2013.   FINDINGS: Segmentation: 5 lumbar vertebrae. The caudal most well-formed intervertebral disc space is designated L5-S1.   Alignment: Trace L4-L5 grade 1 anterolisthesis, new from the prior MRI.   Vertebrae: Vertebral body height is maintained. No significant marrow edema or focal suspicious osseous lesion.   Conus medullaris and cauda equina: Conus extends to the L2 level. No signal abnormality within the visualized distal spinal cord.   Paraspinal and other soft tissues: Multiple large bilateral renal cysts, incompletely imaged and incompletely assessed. Paraspinal soft tissues unremarkable.   Disc levels:   Unless otherwise stated, the level by level findings below have not significantly changed from the prior MRI of 06/29/2013.   Mild disc degeneration at L3-L4, L4-L5 and L5-S1, slightly progressed.   T11-T12: This level is imaged in the sagittal plane only. Slight disc bulge. Mild facet arthrosis and ligamentum flavum hypertrophy. No significant spinal canal or foraminal stenosis.   T12-L1: Slight disc bulge. Mild facet arthrosis. No significant spinal canal or foraminal stenosis.   L1-L2: Minimal facet and ligamentum flavum hypertrophy. No significant disc herniation or stenosis.   L2-L3: Mild facet arthrosis and ligamentum flavum hypertrophy. No significant disc herniation or stenosis.   L3-L4: Small disc bulge. Progressive moderate facet arthrosis with ligamentum flavum hypertrophy. Progressive mild epidural lipomatosis. Progressive mild narrowing of the spinal canal. No significant foraminal stenosis.   L4-L5: Trace grade 1 anterolisthesis, new from the prior exam. Slight disc uncovering. Moderate bilateral facet arthrosis with ligamentum flavum hypertrophy. Mild epidural lipomatosis. No significant spinal canal stenosis.  Minimal relative narrowing of the left neural foramen.   L5-S1: Mild-to-moderate facet arthrosis with ligamentum flavum hypertrophy. No significant disc herniation or stenosis.   IMPRESSION: Lumbar and lower thoracic spondylosis, and mild epidural lipomatosis, as described and slightly progressed at multiple levels since the prior MRI of 06/29/2013. No more than mild spinal canal or neural foraminal narrowing. Mild multilevel disc degeneration, greatest at L3-L4, L3-L4 and L4-L5.   Trace L4-L5 grade 1 anterolisthesis, new from the prior MRI.   Multiple incompletely imaged large bilateral renal cysts.     Electronically Signed   By: Rockey Childs D.O.   On: 06/19/2021 09:48  Imaging: No results found.   PMFS History: Patient Active Problem List   Diagnosis Date Noted   Cervical radiculopathy 04/08/2023   Nausea 04/08/2023   Numbness and tingling 04/08/2023   Hematuria 04/08/2023   Fibromyalgia 12/31/2022   Gastroesophageal reflux disease 05/14/2022   History of kidney stones 05/14/2022   Renal cyst 06/26/2021   Left shoulder pain 09/12/2020   Primary osteoarthritis, left shoulder 09/11/2020   Dysphagia 08/23/2020   B12 deficiency 08/22/2020   Hyperlipidemia 08/22/2020   Moderate episode of recurrent major depressive disorder (HCC) 08/22/2020   Major depressive disorder  Osteoarthritis of both hips 06/15/2019   Osteoarthritis of knees, bilateral 03/29/2019   Morbid obesity (HCC) 02/15/2018   Lipoma of left shoulder 12/02/2016   BMI 45.0-49.9, adult [facility])    Preventative health care 10/10/2014   Essential hypertension 09/06/2014   Osteoarthritis 09/06/2014   Post-menopausal bleeding 09/06/2014   OSA (obstructive sleep apnea) 09/06/2014   Generalized anxiety disorder 09/06/2014   Vitamin D  deficiency 09/06/2014   Chronic back pain    Past Medical History:  Diagnosis Date   Bilateral primary osteoarthritis of knee 03/29/2019   Chronic back pain    Cystocele  with rectocele    Generalized anxiety disorder    GERD (gastroesophageal reflux disease)    Headache    otc med prn   Heart murmur    echo in epic 11-26-2017  normal w/ ef 55-60%   History of chest pain    pt referred for atyical chest pain , cardiology evaluation w/ dr berry note in epic 10-25-2014, stated with low risk w/ no ischemia , nuclear ef 70% , done 09-19-2014 in epic,  non-cardiac chest pain   HTN (hypertension)    followed by pcp    (normal nuclear stress test 09-19-2014 in epic)   Low blood potassium    Major depressive disorder    OSA (obstructive sleep apnea)    04/2015 study in e- Severe OSA (AHI = 55.0/hour);   (10-06-2019  per pt has not used cpap since 2019 after gastric bypass)   Osteoarthritis of both hips 06/15/2019   Plantar fasciitis of left foot    S/P gastric bypass 02/15/2018   sleeve   SUI (stress urinary incontinence, female)    Vitamin D  deficiency 09/06/2014   Wears glasses     Family History  Problem Relation Age of Onset   Lung cancer Mother 68       dec   Cancer Mother    Hypertension Mother    Arthritis Mother    Cancer Father 61       prostate   Hypertension Father    Leukemia Sister    Hypertension Sister    Arthritis Sister    Hyperlipidemia Sister    Hypertension Brother    Arthritis Brother    Multiple sclerosis Sister    Hypertension Sister    Hypertension Sister    Hypertension Brother    Hypertension Brother    Breast cancer Paternal Aunt    Alzheimer's disease Paternal Aunt    Diabetes Maternal Grandmother    Stroke Maternal Grandmother    Heart attack Paternal Grandmother    Asthma Other     Past Surgical History:  Procedure Laterality Date   BLADDER SUSPENSION N/A 10/12/2019   Procedure: TRANSVAGINAL TAPE (TVT) PROCEDURE;  Surgeon: Sarrah Browning, MD;  Location: [facility];  Service: Gynecology;  Laterality: N/A;   CESAREAN SECTION W/BTL  1990   COLONOSCOPY WITH PROPOFOL  N/A 08/30/2015   Procedure:  COLONOSCOPY WITH PROPOFOL ;  Surgeon: Gustav LULLA Mcgee, MD;  Location: [facility];  Service: Endoscopy;  Laterality: N/A;   CYSTOCELE REPAIR N/A 10/12/2019   Procedure: ANTERIOR (CYSTOCELE);  Surgeon: Sarrah Browning, MD;  Location: [facility];  Service: Gynecology;  Laterality: N/A;   CYSTOSCOPY N/A 05/23/2015   Procedure: CYSTOSCOPY;  Surgeon: Browning Sarrah, MD;  Location: [facility];  Service: Gynecology;  Laterality: N/A;   CYSTOSCOPY N/A 10/12/2019   Procedure: CYSTOSCOPY;  Surgeon: Sarrah Browning, MD;  Location: [facility];  Service: Gynecology;  Laterality: N/A;   HIATAL HERNIA REPAIR N/A 02/15/2018   Procedure: HERNIA REPAIR HIATAL;  Surgeon: Mikell Katz, MD;  Location: [facility];  Service: General;  Laterality: N/A;   HYSTEROSCOPY WITH D & C N/A 01/11/2015   Procedure: DILATATION AND CURETTAGE /HYSTEROSCOPY;  Surgeon: Jolene Gaskins, MD;  Location: [facility];  Service: Gynecology;  Laterality: N/A;   KNEE ARTHROSCOPY Right 2010   LAPAROSCOPIC GASTRIC SLEEVE RESECTION N/A 02/15/2018   Procedure: LAPAROSCOPIC GASTRIC SLEEVE RESECTION WITH UPPER ENDO AND ERAS PATHWAY;  Surgeon: Mikell Katz, MD;  Location: [facility];  Service: General;  Laterality: N/A;   LAPAROSCOPIC OOPHORECTOMY Bilateral 2004   LEFT OOPHORECTOMY AND PARTIAL RIGHT OOPHORECTOMY   LIPOMA EXCISION Left 12/02/2016   Procedure: EXCISION LIPOMA OF LEFT SHOULDER;  Surgeon: Anderson Maude ORN, MD;  Location: [facility];  Service: Orthopedics;  Laterality: Left;   ROBOTIC ASSISTED TOTAL HYSTERECTOMY WITH SALPINGECTOMY Bilateral 05/23/2015   Procedure: ROBOTIC ASSISTED TOTAL HYSTERECTOMY WITH SALPINGECTOMYwith right oophorectomy;  Surgeon: Rosaline Luna, MD;  Location: [facility];  Service: Gynecology;  Laterality: Bilateral;   UMBILICAL HERNIA REPAIR  2002   Social History   Occupational History   Occupation: Disability  Tobacco Use   Smoking status: Never   Smokeless tobacco: Never  Vaping  Use   Vaping status: Never Used  Substance and Sexual Activity   Alcohol use: No    Alcohol/week: 0.0 standard drinks of alcohol   Drug use: Never   Sexual activity: Not on file    Comment: BTL w/ C/S in 1990

## 2023-09-24 ENCOUNTER — Encounter (HOSPITAL_COMMUNITY): Payer: Self-pay | Admitting: *Deleted

## 2023-09-28 ENCOUNTER — Encounter (HOSPITAL_COMMUNITY): Payer: Self-pay

## 2023-09-28 ENCOUNTER — Emergency Department (HOSPITAL_COMMUNITY)

## 2023-09-28 ENCOUNTER — Inpatient Hospital Stay (HOSPITAL_COMMUNITY)
Admission: EM | Admit: 2023-09-28 | Discharge: 2023-10-01 | DRG: 853 | Disposition: A | Discharge status: Home Health | Attending: Family Medicine | Admitting: Family Medicine

## 2023-09-28 ENCOUNTER — Other Ambulatory Visit: Payer: Self-pay

## 2023-09-28 DIAGNOSIS — Z9884 Bariatric surgery status: Secondary | ICD-10-CM

## 2023-09-28 DIAGNOSIS — N12 Tubulo-interstitial nephritis, not specified as acute or chronic: Secondary | ICD-10-CM | POA: Diagnosis not present

## 2023-09-28 DIAGNOSIS — R0781 Pleurodynia: Secondary | ICD-10-CM | POA: Diagnosis not present

## 2023-09-28 DIAGNOSIS — Z8249 Family history of ischemic heart disease and other diseases of the circulatory system: Secondary | ICD-10-CM | POA: Diagnosis not present

## 2023-09-28 DIAGNOSIS — B9620 Unspecified Escherichia coli [E. coli] as the cause of diseases classified elsewhere: Secondary | ICD-10-CM | POA: Diagnosis present

## 2023-09-28 DIAGNOSIS — N151 Renal and perinephric abscess: Secondary | ICD-10-CM | POA: Diagnosis present

## 2023-09-28 DIAGNOSIS — R6520 Severe sepsis without septic shock: Secondary | ICD-10-CM | POA: Diagnosis present

## 2023-09-28 DIAGNOSIS — G629 Polyneuropathy, unspecified: Secondary | ICD-10-CM | POA: Diagnosis not present

## 2023-09-28 DIAGNOSIS — Z888 Allergy status to other drugs, medicaments and biological substances status: Secondary | ICD-10-CM

## 2023-09-28 DIAGNOSIS — G4733 Obstructive sleep apnea (adult) (pediatric): Secondary | ICD-10-CM | POA: Diagnosis present

## 2023-09-28 DIAGNOSIS — N1330 Unspecified hydronephrosis: Secondary | ICD-10-CM | POA: Diagnosis not present

## 2023-09-28 DIAGNOSIS — K219 Gastro-esophageal reflux disease without esophagitis: Secondary | ICD-10-CM | POA: Diagnosis present

## 2023-09-28 DIAGNOSIS — G894 Chronic pain syndrome: Secondary | ICD-10-CM | POA: Diagnosis present

## 2023-09-28 DIAGNOSIS — Z90721 Acquired absence of ovaries, unilateral: Secondary | ICD-10-CM

## 2023-09-28 DIAGNOSIS — N393 Stress incontinence (female) (male): Secondary | ICD-10-CM | POA: Diagnosis present

## 2023-09-28 DIAGNOSIS — Z806 Family history of leukemia: Secondary | ICD-10-CM

## 2023-09-28 DIAGNOSIS — N10 Acute tubulo-interstitial nephritis: Secondary | ICD-10-CM | POA: Diagnosis present

## 2023-09-28 DIAGNOSIS — R0602 Shortness of breath: Secondary | ICD-10-CM | POA: Diagnosis not present

## 2023-09-28 DIAGNOSIS — E861 Hypovolemia: Secondary | ICD-10-CM | POA: Diagnosis present

## 2023-09-28 DIAGNOSIS — R110 Nausea: Secondary | ICD-10-CM | POA: Diagnosis not present

## 2023-09-28 DIAGNOSIS — N136 Pyonephrosis: Secondary | ICD-10-CM | POA: Diagnosis not present

## 2023-09-28 DIAGNOSIS — Z83438 Family history of other disorder of lipoprotein metabolism and other lipidemia: Secondary | ICD-10-CM

## 2023-09-28 DIAGNOSIS — Z825 Family history of asthma and other chronic lower respiratory diseases: Secondary | ICD-10-CM

## 2023-09-28 DIAGNOSIS — N281 Cyst of kidney, acquired: Secondary | ICD-10-CM | POA: Diagnosis present

## 2023-09-28 DIAGNOSIS — R1111 Vomiting without nausea: Secondary | ICD-10-CM | POA: Diagnosis not present

## 2023-09-28 DIAGNOSIS — E876 Hypokalemia: Secondary | ICD-10-CM | POA: Diagnosis present

## 2023-09-28 DIAGNOSIS — I10 Essential (primary) hypertension: Secondary | ICD-10-CM | POA: Diagnosis present

## 2023-09-28 DIAGNOSIS — Z880 Allergy status to penicillin: Secondary | ICD-10-CM | POA: Diagnosis not present

## 2023-09-28 DIAGNOSIS — Z90710 Acquired absence of both cervix and uterus: Secondary | ICD-10-CM

## 2023-09-28 DIAGNOSIS — Z79899 Other long term (current) drug therapy: Secondary | ICD-10-CM

## 2023-09-28 DIAGNOSIS — Z7985 Long-term (current) use of injectable non-insulin antidiabetic drugs: Secondary | ICD-10-CM

## 2023-09-28 DIAGNOSIS — N179 Acute kidney failure, unspecified: Secondary | ICD-10-CM | POA: Diagnosis not present

## 2023-09-28 DIAGNOSIS — Z6841 Body Mass Index (BMI) 40.0 and over, adult: Secondary | ICD-10-CM

## 2023-09-28 DIAGNOSIS — F411 Generalized anxiety disorder: Secondary | ICD-10-CM | POA: Diagnosis present

## 2023-09-28 DIAGNOSIS — N132 Hydronephrosis with renal and ureteral calculous obstruction: Secondary | ICD-10-CM | POA: Diagnosis not present

## 2023-09-28 DIAGNOSIS — Z820 Family history of epilepsy and other diseases of the nervous system: Secondary | ICD-10-CM

## 2023-09-28 DIAGNOSIS — Z87442 Personal history of urinary calculi: Secondary | ICD-10-CM

## 2023-09-28 DIAGNOSIS — A419 Sepsis, unspecified organism: Principal | ICD-10-CM | POA: Diagnosis present

## 2023-09-28 DIAGNOSIS — F32A Depression, unspecified: Secondary | ICD-10-CM | POA: Diagnosis present

## 2023-09-28 DIAGNOSIS — Z833 Family history of diabetes mellitus: Secondary | ICD-10-CM

## 2023-09-28 DIAGNOSIS — E66813 Obesity, class 3: Secondary | ICD-10-CM | POA: Diagnosis present

## 2023-09-28 DIAGNOSIS — E871 Hypo-osmolality and hyponatremia: Secondary | ICD-10-CM | POA: Diagnosis present

## 2023-09-28 DIAGNOSIS — N200 Calculus of kidney: Secondary | ICD-10-CM | POA: Diagnosis not present

## 2023-09-28 DIAGNOSIS — K449 Diaphragmatic hernia without obstruction or gangrene: Secondary | ICD-10-CM | POA: Diagnosis not present

## 2023-09-28 DIAGNOSIS — N390 Urinary tract infection, site not specified: Secondary | ICD-10-CM | POA: Diagnosis not present

## 2023-09-28 DIAGNOSIS — G4730 Sleep apnea, unspecified: Secondary | ICD-10-CM | POA: Diagnosis not present

## 2023-09-28 DIAGNOSIS — Z823 Family history of stroke: Secondary | ICD-10-CM

## 2023-09-28 DIAGNOSIS — F418 Other specified anxiety disorders: Secondary | ICD-10-CM | POA: Diagnosis not present

## 2023-09-28 DIAGNOSIS — Z8261 Family history of arthritis: Secondary | ICD-10-CM

## 2023-09-28 DIAGNOSIS — Z801 Family history of malignant neoplasm of trachea, bronchus and lung: Secondary | ICD-10-CM

## 2023-09-28 DIAGNOSIS — Z803 Family history of malignant neoplasm of breast: Secondary | ICD-10-CM

## 2023-09-28 DIAGNOSIS — M797 Fibromyalgia: Secondary | ICD-10-CM | POA: Diagnosis present

## 2023-09-28 DIAGNOSIS — R590 Localized enlarged lymph nodes: Secondary | ICD-10-CM | POA: Diagnosis not present

## 2023-09-28 LAB — DIFFERENTIAL
Abs Immature Granulocytes: 0.18 K/uL — ABNORMAL HIGH (ref 0.00–0.07)
Basophils Absolute: 0 K/uL (ref 0.0–0.1)
Basophils Relative: 0 %
Eosinophils Absolute: 0 K/uL (ref 0.0–0.5)
Eosinophils Relative: 0 %
Immature Granulocytes: 1 %
Lymphocytes Relative: 4 %
Lymphs Abs: 0.8 K/uL (ref 0.7–4.0)
Monocytes Absolute: 2.1 K/uL — ABNORMAL HIGH (ref 0.1–1.0)
Monocytes Relative: 12 %
Neutro Abs: 14.4 K/uL — ABNORMAL HIGH (ref 1.7–7.7)
Neutrophils Relative %: 83 %

## 2023-09-28 LAB — BASIC METABOLIC PANEL WITH GFR
Anion gap: 11 (ref 5–15)
BUN: 19 mg/dL (ref 6–20)
CO2: 24 mmol/L (ref 22–32)
Calcium: 8.2 mg/dL — ABNORMAL LOW (ref 8.9–10.3)
Chloride: 97 mmol/L — ABNORMAL LOW (ref 98–111)
Creatinine, Ser: 2.04 mg/dL — ABNORMAL HIGH (ref 0.44–1.00)
GFR, Estimated: 28 mL/min — ABNORMAL LOW (ref >60)
Glucose, Bld: 158 mg/dL — ABNORMAL HIGH (ref 70–99)
Potassium: 3.4 mmol/L — ABNORMAL LOW (ref 3.5–5.1)
Sodium: 132 mmol/L — ABNORMAL LOW (ref 135–145)

## 2023-09-28 LAB — HEPATIC FUNCTION PANEL
ALT: 22 U/L (ref 0–44)
AST: 32 U/L (ref 15–41)
Albumin: 2.7 g/dL — ABNORMAL LOW (ref 3.5–5.0)
Alkaline Phosphatase: 93 U/L (ref 38–126)
Bilirubin, Direct: 0.7 mg/dL — ABNORMAL HIGH (ref 0.0–0.2)
Indirect Bilirubin: 1 mg/dL — ABNORMAL HIGH (ref 0.3–0.9)
Total Bilirubin: 1.7 mg/dL — ABNORMAL HIGH (ref 0.0–1.2)
Total Protein: 6.7 g/dL (ref 6.5–8.1)

## 2023-09-28 LAB — TROPONIN I (HIGH SENSITIVITY)
Troponin I (High Sensitivity): 6 ng/L (ref <18)
Troponin I (High Sensitivity): 6 ng/L (ref <18)

## 2023-09-28 LAB — URINALYSIS, ROUTINE W REFLEX MICROSCOPIC
Bilirubin Urine: NEGATIVE
Glucose, UA: NEGATIVE mg/dL
Hgb urine dipstick: NEGATIVE
Ketones, ur: NEGATIVE mg/dL
Nitrite: POSITIVE — AB
Protein, ur: 100 mg/dL — AB
Specific Gravity, Urine: 1.013 (ref 1.005–1.030)
WBC, UA: >50 WBC/hpf (ref 0–5)
pH: 5 (ref 5.0–8.0)

## 2023-09-28 LAB — CBC
HCT: 33.7 % — ABNORMAL LOW (ref 36.0–46.0)
Hemoglobin: 11.1 g/dL — ABNORMAL LOW (ref 12.0–15.0)
MCH: 33.6 pg (ref 26.0–34.0)
MCHC: 32.9 g/dL (ref 30.0–36.0)
MCV: 102.1 fL — ABNORMAL HIGH (ref 80.0–100.0)
Platelets: 251 K/uL (ref 150–400)
RBC: 3.3 MIL/uL — ABNORMAL LOW (ref 3.87–5.11)
RDW: 13.2 % (ref 11.5–15.5)
WBC: 17.2 K/uL — ABNORMAL HIGH (ref 4.0–10.5)
nRBC: 0 % (ref 0.0–0.2)

## 2023-09-28 LAB — I-STAT CG4 LACTIC ACID, ED
Lactic Acid, Venous: 1.8 mmol/L (ref 0.5–1.9)
Lactic Acid, Venous: 1.3 mmol/L (ref 0.5–1.9)

## 2023-09-28 MED ORDER — ONDANSETRON HCL 4 MG/2ML IJ SOLN
4.0000 mg | Freq: Once | INTRAMUSCULAR | Status: AC
Start: 2023-09-28 — End: 2023-09-28
  Administered 2023-09-28: 4 mg via INTRAVENOUS
  Filled 2023-09-28: qty 2

## 2023-09-28 MED ORDER — MORPHINE SULFATE (PF) 4 MG/ML IV SOLN
4.0000 mg | Freq: Once | INTRAVENOUS | Status: AC
  Administered 2023-09-28: 4 mg via INTRAVENOUS
  Filled 2023-09-28: qty 1

## 2023-09-28 MED ORDER — POTASSIUM CHLORIDE CRYS ER 20 MEQ PO TBCR
40.0000 meq | EXTENDED_RELEASE_TABLET | Freq: Once | ORAL | Status: DC
  Filled 2023-09-28: qty 2

## 2023-09-28 MED ORDER — ACETAMINOPHEN 650 MG RE SUPP: 650.0000 mg | Freq: Four times a day (QID) | RECTAL | Status: DC | PRN

## 2023-09-28 MED ORDER — BISACODYL 5 MG PO TBEC: 5.0000 mg | DELAYED_RELEASE_TABLET | Freq: Every day | ORAL | Status: DC | PRN

## 2023-09-28 MED ORDER — ONDANSETRON HCL 4 MG PO TABS: 4.0000 mg | ORAL_TABLET | Freq: Four times a day (QID) | ORAL | Status: DC | PRN

## 2023-09-28 MED ORDER — ENOXAPARIN SODIUM 40 MG/0.4ML IJ SOSY: 40.0000 mg | PREFILLED_SYRINGE | INTRAMUSCULAR | Status: DC

## 2023-09-28 MED ORDER — SODIUM CHLORIDE 0.9 % IV SOLN
2.0000 g | Freq: Once | INTRAVENOUS | Status: AC
  Administered 2023-09-28: 2 g via INTRAVENOUS
  Filled 2023-09-28: qty 20

## 2023-09-28 MED ORDER — ENOXAPARIN SODIUM 40 MG/0.4ML IJ SOSY
40.0000 mg | PREFILLED_SYRINGE | INTRAMUSCULAR | Status: DC
  Administered 2023-09-28 – 2023-09-30 (×3): 40 mg via SUBCUTANEOUS
  Filled 2023-09-28 (×3): qty 0.4

## 2023-09-28 MED ORDER — HYDROMORPHONE HCL 1 MG/ML IJ SOLN
0.5000 mg | Freq: Four times a day (QID) | INTRAMUSCULAR | Status: DC | PRN
  Administered 2023-09-29 (×2): 0.5 mg via INTRAVENOUS
  Filled 2023-09-28 (×2): qty 0.5

## 2023-09-28 MED ORDER — POTASSIUM CHLORIDE CRYS ER 20 MEQ PO TBCR
40.0000 meq | EXTENDED_RELEASE_TABLET | Freq: Once | ORAL | Status: AC
  Administered 2023-09-28: 40 meq via ORAL
  Filled 2023-09-28: qty 2

## 2023-09-28 MED ORDER — ONDANSETRON HCL 4 MG/2ML IJ SOLN
4.0000 mg | Freq: Once | INTRAMUSCULAR | Status: AC
  Administered 2023-09-28: 4 mg via INTRAVENOUS
  Filled 2023-09-28: qty 2

## 2023-09-28 MED ORDER — ACETAMINOPHEN 325 MG PO TABS
650.0000 mg | ORAL_TABLET | Freq: Four times a day (QID) | ORAL | Status: DC | PRN
  Administered 2023-09-28: 650 mg via ORAL
  Filled 2023-09-28: qty 2

## 2023-09-28 MED ORDER — SODIUM CHLORIDE 0.9 % IV SOLN: INTRAVENOUS | Status: AC

## 2023-09-28 MED ORDER — SENNOSIDES-DOCUSATE SODIUM 8.6-50 MG PO TABS: 1.0000 | ORAL_TABLET | Freq: Every evening | ORAL | Status: DC | PRN

## 2023-09-28 MED ORDER — ONDANSETRON HCL 4 MG/2ML IJ SOLN
4.0000 mg | Freq: Four times a day (QID) | INTRAMUSCULAR | Status: DC | PRN
  Administered 2023-09-29: 4 mg via INTRAVENOUS
  Filled 2023-09-28: qty 2

## 2023-09-28 MED ORDER — SODIUM CHLORIDE 0.9 % IV BOLUS
1000.0000 mL | Freq: Once | INTRAVENOUS | Status: AC
  Administered 2023-09-28: 1000 mL via INTRAVENOUS

## 2023-09-28 NOTE — ED Provider Notes (Signed)
 Received signout from previous provider, please see her note for complete H&P.  This is a 58 year old female significant history of stress incontinence, depression, chronic back pain, fibromyalgia, history of kidney stone presenting with complaint of flank pain.  She endorsed ongoing left flank pain for the past 3 days with associated nausea vomiting diarrhea and dysuria.  On exam, patient is laying in bed appears fatigued.  She does have some tenderness to her abdomen and her left flank.  She is not hypoxic.    Labs and imaging obtained and independently viewed by me.  Urinalysis consistent with urinary tract infection.  Rocephin  given.  Urine culture sent.  White count is elevated at 17.2.  Signs of AKI with a creatinine of 0.24.  Lactic acid is normal.  CT scan of the abdomen pelvis obtained signs of pyelonephritis. Agree with radiologist interpretation.  Therefore, plan to consult hospitalist admission for AKI and pyelonephritis.   Appreciate consultation from [facility] Dr. Georgina who agrees to see and will admit pt for further management of her pyelonephritis.   BP (!) 139/90 (BP Location: Left Arm)   Pulse 78   Temp 98.3 F (36.8 C) (Oral)   Resp 17   LMP 10/02/2007   SpO2 97%   Results for orders placed or performed during the hospital encounter of 09/28/23  Basic metabolic panel   Collection Time: 09/28/23  2:13 PM  Result Value Ref Range   Sodium 132 (L) 135 - 145 mmol/L   Potassium 3.4 (L) 3.5 - 5.1 mmol/L   Chloride 97 (L) 98 - 111 mmol/L   CO2 24 22 - 32 mmol/L   Glucose, Bld 158 (H) 70 - 99 mg/dL   BUN 19 6 - 20 mg/dL   Creatinine, Ser 7.95 (H) 0.44 - 1.00 mg/dL   Calcium 8.2 (L) 8.9 - 10.3 mg/dL   GFR, Estimated 28 (L) >60 mL/min   Anion gap 11 5 - 15  CBC   Collection Time: 09/28/23  2:13 PM  Result Value Ref Range   WBC 17.2 (H) 4.0 - 10.5 K/uL   RBC 3.30 (L) 3.87 - 5.11 MIL/uL   Hemoglobin 11.1 (L) 12.0 - 15.0 g/dL   HCT 66.2 (L) 63.9 - 53.9 %   MCV  102.1 (H) 80.0 - 100.0 fL   MCH 33.6 26.0 - 34.0 pg   MCHC 32.9 30.0 - 36.0 g/dL   RDW 86.7 88.4 - 84.4 %   Platelets 251 150 - 400 K/uL   nRBC 0.0 0.0 - 0.2 %  Differential   Collection Time: 09/28/23  2:13 PM  Result Value Ref Range   Neutrophils Relative % 83 %   Neutro Abs 14.4 (H) 1.7 - 7.7 K/uL   Lymphocytes Relative 4 %   Lymphs Abs 0.8 0.7 - 4.0 K/uL   Monocytes Relative 12 %   Monocytes Absolute 2.1 (H) 0.1 - 1.0 K/uL   Eosinophils Relative 0 %   Eosinophils Absolute 0.0 0.0 - 0.5 K/uL   Basophils Relative 0 %   Basophils Absolute 0.0 0.0 - 0.1 K/uL   Immature Granulocytes 1 %   Abs Immature Granulocytes 0.18 (H) 0.00 - 0.07 K/uL  Hepatic function panel   Collection Time: 09/28/23  2:14 PM  Result Value Ref Range   Total Protein 6.7 6.5 - 8.1 g/dL   Albumin  2.7 (L) 3.5 - 5.0 g/dL   AST 32 15 - 41 U/L   ALT 22 0 - 44 U/L   Alkaline Phosphatase 93  38 - 126 U/L   Total Bilirubin 1.7 (H) 0.0 - 1.2 mg/dL   Bilirubin, Direct 0.7 (H) 0.0 - 0.2 mg/dL   Indirect Bilirubin 1.0 (H) 0.3 - 0.9 mg/dL  Troponin I (High Sensitivity)   Collection Time: 09/28/23  2:14 PM  Result Value Ref Range   Troponin I (High Sensitivity) 6 <18 ng/L  Urinalysis, Routine w reflex microscopic -Urine, Catheterized   Collection Time: 09/28/23  2:21 PM  Result Value Ref Range   Color, Urine YELLOW YELLOW   APPearance CLOUDY (A) CLEAR   Specific Gravity, Urine 1.013 1.005 - 1.030   pH 5.0 5.0 - 8.0   Glucose, UA NEGATIVE NEGATIVE mg/dL   Hgb urine dipstick NEGATIVE NEGATIVE   Bilirubin Urine NEGATIVE NEGATIVE   Ketones, ur NEGATIVE NEGATIVE mg/dL   Protein, ur 899 (A) NEGATIVE mg/dL   Nitrite POSITIVE (A) NEGATIVE   Leukocytes,Ua LARGE (A) NEGATIVE   RBC / HPF 0-5 0 - 5 RBC/hpf   WBC, UA >50 0 - 5 WBC/hpf   Bacteria, UA MANY (A) NONE SEEN   Squamous Epithelial / HPF 0-5 0 - 5 /HPF   Mucus PRESENT    Amorphous Crystal PRESENT   I-Stat CG4 Lactic Acid   Collection Time: 09/28/23  2:48 PM   Result Value Ref Range   Lactic Acid, Venous 1.8 0.5 - 1.9 mmol/L  I-Stat CG4 Lactic Acid   Collection Time: 09/28/23  5:19 PM  Result Value Ref Range   Lactic Acid, Venous 1.3 0.5 - 1.9 mmol/L   CT Renal Stone Study Result Date: 09/28/2023 CLINICAL DATA:  Abdominal/flank pain EXAM: CT ABDOMEN AND PELVIS WITHOUT CONTRAST TECHNIQUE: Multidetector CT imaging of the abdomen and pelvis was performed following the standard protocol without IV contrast. RADIATION DOSE REDUCTION: This exam was performed according to the departmental dose-optimization program which includes automated exposure control, adjustment of the mA and/or kV according to patient size and/or use of iterative reconstruction technique. COMPARISON:  Ultrasound 04/26/2021 FINDINGS: Lower chest: Peripheral airspace opacities in both lower lobes appear to have associated volume loss and accordingly favor atelectasis over pneumonia. Low-density blood pool favors anemia. Small type 1 hiatal hernia. Hepatobiliary: Unremarkable Pancreas: Unremarkable Spleen: Unremarkable Adrenals/Urinary Tract: Adrenal glands unremarkable. Left hydronephrosis with complex high density material in the left collecting system along with abnormal gas in the left collecting system. Large simple right renal cysts and various left renal cysts which measure less than 20 Hounsfield units but with some cyst demonstrating complexity such as wall calcification or thick margins. Left renal calculi including some speckled calcific densities along the collecting system. Left perinephric stranding and mild proximal periureteral stranding. Stomach/Bowel: Sleeve partial gastrectomy. Vascular/Lymphatic: Mild abdominal aortic atherosclerosis. Enlarged periaortic lymph nodes at the level of the left kidney, including a 1.6 cm node on image 32 series 2. Reproductive: Uterus absent. Other: No supplemental non-categorized findings. Musculoskeletal: Lower lumbar degenerative facet arthropathy.  Supraumbilical hernia contains adipose tissue as shown on image 107 series 6. Moderate to prominent degenerative hip arthropathy bilaterally. IMPRESSION: 1. Left hydronephrosis with complex high density material in the left collecting system along with abnormal gas in the left collecting system. If the patient has not had recent urologic instrumentation of the left kidney and/or lithotripsy, then the appearance is suspicious for pyonephrosis, obstruction, and possibly pyelonephritis/renal abscess. 2. Left renal calculi including some speckled calcific densities along the collecting system. 3. Enlarged periaortic lymph nodes at the level of the left kidney, including a 1.6 cm node. Likely reactive,  less likely due to malignancy. Follow up to ensure clearance recommended. 4. Low-density blood pool favors anemia. 5. Peripheral airspace opacities in both lower lobes appear to have associated volume loss and accordingly favor atelectasis over pneumonia. 6. Small type 1 hiatal hernia. 7. Supraumbilical hernia contains adipose tissue. 8. Moderate to prominent degenerative hip arthropathy bilaterally. 9. Lower lumbar degenerative facet arthropathy. 10. Aortic Atherosclerosis (ICD10-I70.0) and Emphysema (ICD10-J43.9). Electronically Signed   By: Ryan Salvage M.D.   On: 09/28/2023 17:27   DG Chest Portable 1 View Result Date: 09/28/2023 CLINICAL DATA:  Short of breath EXAM: PORTABLE CHEST 1 VIEW COMPARISON:  None Available. FINDINGS: Normal mediastinum and cardiac silhouette. Normal pulmonary vasculature. No evidence of effusion, infiltrate, or pneumothorax. No acute bony abnormality. IMPRESSION: No acute cardiopulmonary process. Electronically Signed   By: Jackquline Boxer M.D.   On: 09/28/2023 15:17      Nivia Colon, PA-C 09/28/23 1832    Charlyn Sora, MD 09/28/23 [id number]

## 2023-09-28 NOTE — ED Triage Notes (Incomplete)
 Pt BIBA from home for flank pain since Friday with increasing pain through the weekend with increasing weakness and lethargy.  Pt has Hx of Kidney Stones147mcg of fentanyl  and

## 2023-09-28 NOTE — ED Provider Notes (Signed)
 [place] EMERGENCY DEPARTMENT AT [facility] Provider Note   CSN: [id number] Arrival date & time: 09/28/23  1318     Patient presents with: Flank Pain   Arwen Haseley is a 58 y.o. female.  {Add pertinent medical, surgical, social history, OB history to YEP:67052} Patient with history of hypertension, gastric bypass, OSA, kidney stones presents today with complaints of left flank pain. Reports that pain began initially on Friday and has been persistent since then. Feels like stones she has had previously. Does endorse feeling generally unwell also, having nausea, vomiting, and diarrhea. Also reports dysuria. Reports that her pain is worse with deep breaths which has been making her feel more short of breath. Patient does present on oxygen initiated by EMS, also given 100 mcg of fentanyl , unsure if oxygen was initiated before or after pain medication. She is not on oxygen normally. Does report that she has been coughing more recently.   The history is provided by the patient. No language interpreter was used.  Flank Pain Associated symptoms include abdominal pain.       Prior to Admission medications   Medication Sig Start Date End Date Taking? Authorizing Provider  nitrofurantoin , macrocrystal-monohydrate, (MACROBID ) 100 MG capsule Take 1 capsule (100 mg total) by mouth 2 (two) times daily. 04/09/23   McElwee, Lauren A, NP  albuterol  (VENTOLIN  HFA) 108 (90 Base) MCG/ACT inhaler INHALE 2 PUFFS BY MOUTH EVERY 6 HOURS AS NEEDED FOR WHEEZING FOR SHORTNESS OF BREATH 04/08/23   O'Sullivan, [name], NP  amLODipine  (NORVASC ) 10 MG tablet Take 1 tablet (10 mg total) by mouth daily. 04/08/23   O'Sullivan, [name], NP  Artificial Tear Ointment (DRY EYES OP) Apply to eye as needed.    [provider]  buPROPion  (WELLBUTRIN  XL) 300 MG 24 hr tablet TAKE 1 TABLET BY MOUTH ONCE DAILY . APPOINTMENT REQUIRED FOR FUTURE REFILLS 05/14/22   O'Sullivan, [name], NP  busPIRone  (BUSPAR ) 7.5  MG tablet Take 1 tablet (7.5 mg total) by mouth 2 (two) times daily. 04/08/23   O'Sullivan, [name], NP  Cholecalciferol (VITAMIN D3 GUMMIES PO) Take 2 tablets by mouth daily. Reports 5000 IU takes 2 gummies daily    [provider]  cyanocobalamin  (VITAMIN B12) 1000 MCG tablet Take 1 tablet (1,000 mcg total) by mouth daily. 05/14/22   Daryl Setter, NP  cyanocobalamin  (VITAMIN B12) 1000 MCG/ML injection Inject 1 mL (1,000 mcg total) into the muscle every 30 (thirty) days. 04/08/23   O'Sullivan, [name], NP  diclofenac  Sodium (VOLTAREN ) 1 % GEL Apply 2 g topically daily as needed. 04/08/23   O'Sullivan, [name], NP  DULoxetine  (CYMBALTA ) 60 MG capsule Take 1 capsule (60 mg total) by mouth daily. 04/08/23   O'Sullivan, [name], NP  famotidine  (PEPCID ) 20 MG tablet Take 1 tablet (20 mg total) by mouth at bedtime. 12/30/22   Nandigam, Kavitha V, MD  furosemide  (LASIX ) 20 MG tablet Take 1 tablet (20 mg total) by mouth daily as needed for edema. 05/14/22   O'Sullivan, [name], NP  gabapentin  (NEURONTIN ) 300 MG capsule Take 1 capsule (300 mg total) by mouth 3 (three) times daily. 10/24/22   O'Sullivan, [name], NP  lisinopril  (ZESTRIL ) 30 MG tablet Take 1 tablet (30 mg total) by mouth daily. 08/16/23   Gladis Elsie BROCKS, PA-C  metoprolol  succinate (TOPROL -XL) 100 MG 24 hr tablet Take 1 tablet (100 mg total) by mouth daily. Take with or immediately following a meal. 12/31/22   Daryl Setter, NP  multivitamin-iron-minerals-folic acid (CENTRUM) chewable tablet Chew 1  tablet by mouth daily.    [provider]  NEEDLE, DISP, 25 G (B-D DISP NEEDLE 25GX1) 25G X 1 MISC For B12 injections 04/08/23   Daryl Setter, NP  nystatin  (MYCOSTATIN /NYSTOP ) powder Apply 1 Application topically 2 (two) times daily. 05/14/22   O'Sullivan, [name], NP  omeprazole  (PRILOSEC) 40 MG capsule Take 1 capsule (40 mg total) by mouth daily. Daily 30 minutes before breakfast 12/30/22   Nandigam, Kavitha V, MD   ondansetron  (ZOFRAN ) 4 MG tablet Take 1 tablet (4 mg total) by mouth every 8 (eight) hours as needed for nausea or vomiting. 04/08/23   Daryl Setter, NP  predniSONE  (STERAPRED UNI-PAK 21 TAB) 10 MG (21) TBPK tablet Take as directed 05/21/23   Jerri Kay HERO, MD  SYRINGE-NEEDLE, DISP, 3 ML 23G X 1 3 ML MISC For B12 injections 04/08/23   O'Sullivan, [name], NP  tirzepatide  (MOUNJARO ) 12.5 MG/0.5ML Pen Inject 12.5 mg into the skin once a week. 08/16/23   Gladis Elsie BROCKS, PA-C  Vitamin D , Ergocalciferol , (DRISDOL ) 1.25 MG (50000 UNIT) CAPS capsule Take 1 capsule (50,000 Units total) by mouth every 7 (seven) days. 04/09/23   McElwee, Tinnie LABOR, NP    Allergies: Penicillins    Review of Systems  Gastrointestinal:  Positive for abdominal pain.  Genitourinary:  Positive for flank pain.  All other systems reviewed and are negative.   Updated Vital Signs BP (!) 147/103   Pulse 90   Temp 99.2 F (37.3 C) (Oral)   Resp 20   LMP 10/02/2007   SpO2 94%   Physical Exam Vitals and nursing note reviewed.  Constitutional:      General: She is not in acute distress.    Appearance: Normal appearance. She is normal weight. She is not ill-appearing, toxic-appearing or diaphoretic.  HENT:     Head: Normocephalic and atraumatic.  Cardiovascular:     Rate and Rhythm: Normal rate and regular rhythm.     Heart sounds: Normal heart sounds.  Pulmonary:     Effort: Pulmonary effort is normal. No respiratory distress.     Breath sounds: Normal breath sounds.  Abdominal:     General: Abdomen is flat.     Palpations: Abdomen is soft.     Tenderness: There is abdominal tenderness. There is left CVA tenderness.  Musculoskeletal:        General: Normal range of motion.     Cervical back: Normal range of motion.  Skin:    General: Skin is warm and dry.  Neurological:     General: No focal deficit present.     Mental Status: She is alert.  Psychiatric:        Mood and Affect: Mood normal.         Behavior: Behavior normal.     (all labs ordered are listed, but only abnormal results are displayed) Labs Reviewed  CBC - Abnormal; Notable for the following components:      Result Value   WBC 17.2 (*)    RBC 3.30 (*)    Hemoglobin 11.1 (*)    HCT 33.7 (*)    MCV 102.1 (*)    All other components within normal limits  DIFFERENTIAL - Abnormal; Notable for the following components:   Neutro Abs 14.4 (*)    Monocytes Absolute 2.1 (*)    Abs Immature Granulocytes 0.18 (*)    All other components within normal limits  URINE CULTURE  URINALYSIS, ROUTINE W REFLEX MICROSCOPIC  BASIC METABOLIC PANEL WITH GFR  HEPATIC FUNCTION PANEL  I-STAT CG4 LACTIC ACID, ED  TROPONIN I (HIGH SENSITIVITY)    EKG: None  Radiology: No results found.  {Document cardiac monitor, telemetry assessment procedure when appropriate:32947} Procedures   Medications Ordered in the ED - No data to display    {Click here for ABCD2, HEART and other calculators REFRESH Note before signing:1}                              Medical Decision Making Amount and/or Complexity of Data Reviewed Labs: ordered. Radiology: ordered.   This patient is a 58 y.o. female who presents to the ED for concern of left flank pain, this involves an extensive number of treatment options, and is a complaint that carries with it a high risk of complications and morbidity. The emergent differential diagnosis prior to evaluation includes, but is not limited to,  AAA, renal vascular thrombosis, mesenteric ischemia, pyelonephritis, nephrolithiasis, cystitis, biliary colic, pancreatitis, PUD, appendicitis, diverticulitis, bowel obstruction, Ectopic Pregnancy, PID/TOA, Ovarian cyst, Ovarian torsion   This is not an exhaustive differential.   Past Medical History / Co-morbidities / Social History:  has a past medical history of Bilateral primary osteoarthritis of knee (03/29/2019), Chronic back pain, Cystocele with rectocele, Generalized  anxiety disorder, GERD (gastroesophageal reflux disease), Headache, Heart murmur, History of chest pain, HTN (hypertension), Low blood potassium, Major depressive disorder, OSA (obstructive sleep apnea), Osteoarthritis of both hips (06/15/2019), Plantar fasciitis of left foot, S/P gastric bypass (02/15/2018), SUI (stress urinary incontinence, female), Vitamin D  deficiency (09/06/2014), and Wears glasses.  Additional history: Chart reviewed.  Physical Exam: Physical exam performed. The pertinent findings include: generally unwell appearing, left CVA tenderness, LLQ TTP  Lab Tests: I ordered, and personally interpreted labs.  The pertinent results include:  WBC 17.2, Na 132, K 3.4, creatinine 2.04 (up from 1.10 1 year ago), UA infectious, nitrite +, large leukocytes, >50 WBCs, many bacteria, no hematuria, lactic WNL   Imaging Studies: I ordered imaging studies including CXR, CT renal.  Imaging pending at shift change   Medications: I ordered medication including fluids, rocephin   for UTI, AKI. Reevaluation of the patient after these medicines showed that the patient improved. I have reviewed the patients home medicines and have made adjustments as needed.   Disposition:  Patients imaging is pending at shift change. However, given AKI and UTI suspicious for pyelonephritis given symptoms, will likely require admission. Of note, patient was initially on 4L O2 via [place] for apparent hypoxia, unsure if this was medication induced given EMS did give her 100 mcg fentanyl . Did turn off oxygen, no desat noted of yet, will need to have this monitored. Cardiac evaluation thus far negative, CXR is pending. She does report she has been coughing more recently. Discussed plan with patient who is understanding and in agreement with plan  Care handoff to Lonni Camp, PA-C at shift change. Please see their note for continued evaluation and dispo  Final diagnoses:  AKI (acute kidney injury) [facility])  Pyelonephritis     ED Discharge Orders     None

## 2023-09-28 NOTE — H&P (Signed)
 History and Physical  Hailey Vance FMW:[id number] DOB: [birth year] DOA: 09/28/2023  PCP: Jerome Heron Ruth, PA-C   Chief Complaint: Flank pain  HPI: Hailey Vance is a 58 y.o. female with medical history significant for HTN, osteoarthritis, OSA, anxiety and depression, kidney stones, GERD, neuropathy, morbid obesity s/p gastric bypass and chronic back pain who presented to the ED for evaluation of left flank pain.  Patient reports she started having left flank pain that is sharp in nature on Friday. She initially thought this was likely a kidney stone due to previous history and was waiting for her it to pass however the pain progressed over the weekend. She endorsed associated burning with urination, nausea, vomiting and chills but no fever, shortness of breath, chest pain, headache, abdominal pain, diarrhea or dizziness.  ED Course: Initial vitals show temp 99.2, RR 20, HR 90, BP 147/103, SpO2 94% on 4 L  after receiving morphine , wean off oxygen with O2 of 97% on room air. Initial labs significant for sodium 132, K+ 3.4, glucose 158, creatinine 2.04, WBC 17.2, Hgb 11.1, troponin 6-6, lactic acid 1.8-1.3, UA shows positive nitrite, large leukocytes, WBC >50 and many bacteria. CXR shows no active disease. CT renal stone shows left hydronephrosis and findings concerning for left pyonephrosis, obstruction or pyelonephritis/renal abscess. Pt received IV Rocephin , IV NS 1 L bolus, IV morphine  and IV Zofran .  TRH was consulted for admission.   Review of Systems: Please see HPI for pertinent positives and negatives. A complete 10 system review of systems are otherwise negative.  Past Medical History:  Diagnosis Date   Bilateral primary osteoarthritis of knee 03/29/2019   Chronic back pain    Cystocele with rectocele    Generalized anxiety disorder    GERD (gastroesophageal reflux disease)    Headache    otc med prn   Heart murmur    echo in epic 11-26-2017  normal w/ ef 55-60%    History of chest pain    pt referred for atyical chest pain , cardiology evaluation w/ dr berry note in epic 10-25-2014, stated with low risk w/ no ischemia , nuclear ef 70% , done 09-19-2014 in epic,  non-cardiac chest pain   HTN (hypertension)    followed by pcp    (normal nuclear stress test 09-19-2014 in epic)   Low blood potassium    Major depressive disorder    OSA (obstructive sleep apnea)    04/2015 study in e- Severe OSA (AHI = 55.0/hour);   (10-06-2019  per pt has not used cpap since 2019 after gastric bypass)   Osteoarthritis of both hips 06/15/2019   Plantar fasciitis of left foot    S/P gastric bypass 02/15/2018   sleeve   SUI (stress urinary incontinence, female)    Vitamin D  deficiency 09/06/2014   Wears glasses    Past Surgical History:  Procedure Laterality Date   BLADDER SUSPENSION N/A 10/12/2019   Procedure: TRANSVAGINAL TAPE (TVT) PROCEDURE;  Surgeon: Sarrah Browning, MD;  Location: [facility];  Service: Gynecology;  Laterality: N/A;   CESAREAN SECTION W/BTL  1990   COLONOSCOPY WITH PROPOFOL  N/A 08/30/2015   Procedure: COLONOSCOPY WITH PROPOFOL ;  Surgeon: Gustav LULLA Mcgee, MD;  Location: [facility];  Service: Endoscopy;  Laterality: N/A;   CYSTOCELE REPAIR N/A 10/12/2019   Procedure: ANTERIOR (CYSTOCELE);  Surgeon: Sarrah Browning, MD;  Location: [facility];  Service: Gynecology;  Laterality: N/A;   CYSTOSCOPY N/A 05/23/2015   Procedure: CYSTOSCOPY;  Surgeon: Browning  Sarrah, MD;  Location: [facility];  Service: Gynecology;  Laterality: N/A;   CYSTOSCOPY N/A 10/12/2019   Procedure: CYSTOSCOPY;  Surgeon: Sarrah Browning, MD;  Location: [facility];  Service: Gynecology;  Laterality: N/A;   HIATAL HERNIA REPAIR N/A 02/15/2018   Procedure: HERNIA REPAIR HIATAL;  Surgeon: Mikell Katz, MD;  Location: [facility];  Service: General;  Laterality: N/A;   HYSTEROSCOPY WITH D & C N/A 01/11/2015   Procedure: DILATATION AND  CURETTAGE /HYSTEROSCOPY;  Surgeon: Jolene Gaskins, MD;  Location: [facility];  Service: Gynecology;  Laterality: N/A;   KNEE ARTHROSCOPY Right 2010   LAPAROSCOPIC GASTRIC SLEEVE RESECTION N/A 02/15/2018   Procedure: LAPAROSCOPIC GASTRIC SLEEVE RESECTION WITH UPPER ENDO AND ERAS PATHWAY;  Surgeon: Mikell Katz, MD;  Location: [facility];  Service: General;  Laterality: N/A;   LAPAROSCOPIC OOPHORECTOMY Bilateral 2004   LEFT OOPHORECTOMY AND PARTIAL RIGHT OOPHORECTOMY   LIPOMA EXCISION Left 12/02/2016   Procedure: EXCISION LIPOMA OF LEFT SHOULDER;  Surgeon: Anderson Maude ORN, MD;  Location: [facility];  Service: Orthopedics;  Laterality: Left;   ROBOTIC ASSISTED TOTAL HYSTERECTOMY WITH SALPINGECTOMY Bilateral 05/23/2015   Procedure: ROBOTIC ASSISTED TOTAL HYSTERECTOMY WITH SALPINGECTOMYwith right oophorectomy;  Surgeon: Browning Sarrah, MD;  Location: [facility];  Service: Gynecology;  Laterality: Bilateral;   UMBILICAL HERNIA REPAIR  2002   Social History:  reports that she has never smoked. She has never used smokeless tobacco. She reports that she does not drink alcohol and does not use drugs.  Allergies  Allergen Reactions   Penicillins Itching and Other (See Comments)    Has patient had a PCN reaction causing immediate rash, facial/tongue/throat swelling, SOB or lightheadedness with hypotension: No Has patient had a PCN reaction causing severe rash involving mucus membranes or skin necrosis: No Has patient had a PCN reaction that required hospitalization No Has patient had a PCN reaction occurring within the last 10 years: No If all of the above answers are NO, then may proceed with Cephalosporin use.     Family History  Problem Relation Age of Onset   Lung cancer Mother 28       dec   Cancer Mother    Hypertension Mother    Arthritis Mother    Cancer Father 97       prostate   Hypertension Father    Leukemia Sister    Hypertension Sister    Arthritis Sister    Hyperlipidemia Sister     Hypertension Brother    Arthritis Brother    Multiple sclerosis Sister    Hypertension Sister    Hypertension Sister    Hypertension Brother    Hypertension Brother    Breast cancer Paternal Aunt    Alzheimer's disease Paternal Aunt    Diabetes Maternal Grandmother    Stroke Maternal Grandmother    Heart attack Paternal Grandmother    Asthma Other      Prior to Admission medications   Medication Sig Start Date End Date Taking? Authorizing Provider  nitrofurantoin , macrocrystal-monohydrate, (MACROBID ) 100 MG capsule Take 1 capsule (100 mg total) by mouth 2 (two) times daily. 04/09/23   McElwee, Lauren A, NP  albuterol  (VENTOLIN  HFA) 108 (90 Base) MCG/ACT inhaler INHALE 2 PUFFS BY MOUTH EVERY 6 HOURS AS NEEDED FOR WHEEZING FOR SHORTNESS OF BREATH 04/08/23   O'Sullivan, [name], NP  amLODipine  (NORVASC ) 10 MG tablet Take 1 tablet (10 mg total) by mouth daily. 04/08/23   Daryl Setter, NP  Artificial Tear Ointment (DRY EYES OP) Apply  to eye as needed.    [provider]  buPROPion  (WELLBUTRIN  XL) 300 MG 24 hr tablet TAKE 1 TABLET BY MOUTH ONCE DAILY . APPOINTMENT REQUIRED FOR FUTURE REFILLS 05/14/22   O'Sullivan, [name], NP  busPIRone  (BUSPAR ) 7.5 MG tablet Take 1 tablet (7.5 mg total) by mouth 2 (two) times daily. 04/08/23   O'Sullivan, [name], NP  Cholecalciferol (VITAMIN D3 GUMMIES PO) Take 2 tablets by mouth daily. Reports 5000 IU takes 2 gummies daily    [provider]  cyanocobalamin  (VITAMIN B12) 1000 MCG tablet Take 1 tablet (1,000 mcg total) by mouth daily. 05/14/22   Daryl Setter, NP  cyanocobalamin  (VITAMIN B12) 1000 MCG/ML injection Inject 1 mL (1,000 mcg total) into the muscle every 30 (thirty) days. 04/08/23   O'Sullivan, [name], NP  diclofenac  Sodium (VOLTAREN ) 1 % GEL Apply 2 g topically daily as needed. 04/08/23   O'Sullivan, [name], NP  DULoxetine  (CYMBALTA ) 60 MG capsule Take 1 capsule (60 mg total) by mouth daily. 04/08/23   O'Sullivan, [name], NP   famotidine  (PEPCID ) 20 MG tablet Take 1 tablet (20 mg total) by mouth at bedtime. 12/30/22   Nandigam, Kavitha V, MD  furosemide  (LASIX ) 20 MG tablet Take 1 tablet (20 mg total) by mouth daily as needed for edema. 05/14/22   O'Sullivan, [name], NP  gabapentin  (NEURONTIN ) 300 MG capsule Take 1 capsule (300 mg total) by mouth 3 (three) times daily. 10/24/22   O'Sullivan, [name], NP  lisinopril  (ZESTRIL ) 30 MG tablet Take 1 tablet (30 mg total) by mouth daily. 08/16/23   Gladis Elsie BROCKS, PA-C  metoprolol  succinate (TOPROL -XL) 100 MG 24 hr tablet Take 1 tablet (100 mg total) by mouth daily. Take with or immediately following a meal. 12/31/22   Daryl Setter, NP  multivitamin-iron-minerals-folic acid (CENTRUM) chewable tablet Chew 1 tablet by mouth daily.    [provider]  NEEDLE, DISP, 25 G (B-D DISP NEEDLE 25GX1) 25G X 1 MISC For B12 injections 04/08/23   Daryl Setter, NP  nystatin  (MYCOSTATIN /NYSTOP ) powder Apply 1 Application topically 2 (two) times daily. 05/14/22   O'Sullivan, [name], NP  omeprazole  (PRILOSEC) 40 MG capsule Take 1 capsule (40 mg total) by mouth daily. Daily 30 minutes before breakfast 12/30/22   Nandigam, Kavitha V, MD  ondansetron  (ZOFRAN ) 4 MG tablet Take 1 tablet (4 mg total) by mouth every 8 (eight) hours as needed for nausea or vomiting. 04/08/23   Daryl Setter, NP  predniSONE  (STERAPRED UNI-PAK 21 TAB) 10 MG (21) TBPK tablet Take as directed 05/21/23   Jerri Kay HERO, MD  SYRINGE-NEEDLE, DISP, 3 ML 23G X 1 3 ML MISC For B12 injections 04/08/23   O'Sullivan, [name], NP  tirzepatide  (MOUNJARO ) 12.5 MG/0.5ML Pen Inject 12.5 mg into the skin once a week. 08/16/23   Gladis Elsie BROCKS, PA-C  Vitamin D , Ergocalciferol , (DRISDOL ) 1.25 MG (50000 UNIT) CAPS capsule Take 1 capsule (50,000 Units total) by mouth every 7 (seven) days. 04/09/23   McElwee, Tinnie LABOR, NP    Physical Exam: BP (!) 139/90 (BP Location: Left Arm)   Pulse 81   Temp 98.3 F (36.8 C)  (Oral)   Resp 17   Ht 5' 3 (1.6 m)   Wt 118.4 kg   LMP 10/02/2007   SpO2 93%   BMI 46.23 kg/m  General: Pleasant, weak appearing obese woman laying in bed. No acute distress. HEENT: [place]/AT. Anicteric sclera. Dry mucous membrane. CV: RRR. No murmurs, rubs, or gallops. No LE edema Pulmonary: Lungs CTAB. Normal effort. No  wheezing or rales. Abdominal: Soft, nontender, nondistended. Left CVA tenderness. Normal bowel sounds. Extremities: Palpable radial and DP pulses. Normal ROM. Skin: Warm and dry. No obvious rash or lesions. Neuro: A&Ox3. Moves all extremities. Normal sensation to light touch. No focal deficit. Psych: Normal mood and affect          Labs on Admission:  Basic Metabolic Panel: Recent Labs  Lab 09/28/23 1413  NA 132*  K 3.4*  CL 97*  CO2 24  GLUCOSE 158*  BUN 19  CREATININE 2.04*  CALCIUM 8.2*   Liver Function Tests: Recent Labs  Lab 09/28/23 1414  AST 32  ALT 22  ALKPHOS 93  BILITOT 1.7*  PROT 6.7  ALBUMIN  2.7*   No results for input(s): LIPASE, AMYLASE in the last 168 hours. No results for input(s): AMMONIA in the last 168 hours. CBC: Recent Labs  Lab 09/28/23 1413  WBC 17.2*  NEUTROABS 14.4*  HGB 11.1*  HCT 33.7*  MCV 102.1*  PLT 251   Cardiac Enzymes: No results for input(s): CKTOTAL, CKMB, CKMBINDEX, TROPONINI in the last 168 hours. BNP (last 3 results) No results for input(s): BNP in the last 8760 hours.  ProBNP (last 3 results) No results for input(s): PROBNP in the last 8760 hours.  CBG: No results for input(s): GLUCAP in the last 168 hours.  Radiological Exams on Admission: CT Renal Stone Study Result Date: 09/28/2023 CLINICAL DATA:  Abdominal/flank pain EXAM: CT ABDOMEN AND PELVIS WITHOUT CONTRAST TECHNIQUE: Multidetector CT imaging of the abdomen and pelvis was performed following the standard protocol without IV contrast. RADIATION DOSE REDUCTION: This exam was performed according to the departmental  dose-optimization program which includes automated exposure control, adjustment of the mA and/or kV according to patient size and/or use of iterative reconstruction technique. COMPARISON:  Ultrasound 04/26/2021 FINDINGS: Lower chest: Peripheral airspace opacities in both lower lobes appear to have associated volume loss and accordingly favor atelectasis over pneumonia. Low-density blood pool favors anemia. Small type 1 hiatal hernia. Hepatobiliary: Unremarkable Pancreas: Unremarkable Spleen: Unremarkable Adrenals/Urinary Tract: Adrenal glands unremarkable. Left hydronephrosis with complex high density material in the left collecting system along with abnormal gas in the left collecting system. Large simple right renal cysts and various left renal cysts which measure less than 20 Hounsfield units but with some cyst demonstrating complexity such as wall calcification or thick margins. Left renal calculi including some speckled calcific densities along the collecting system. Left perinephric stranding and mild proximal periureteral stranding. Stomach/Bowel: Sleeve partial gastrectomy. Vascular/Lymphatic: Mild abdominal aortic atherosclerosis. Enlarged periaortic lymph nodes at the level of the left kidney, including a 1.6 cm node on image 32 series 2. Reproductive: Uterus absent. Other: No supplemental non-categorized findings. Musculoskeletal: Lower lumbar degenerative facet arthropathy. Supraumbilical hernia contains adipose tissue as shown on image 107 series 6. Moderate to prominent degenerative hip arthropathy bilaterally. IMPRESSION: 1. Left hydronephrosis with complex high density material in the left collecting system along with abnormal gas in the left collecting system. If the patient has not had recent urologic instrumentation of the left kidney and/or lithotripsy, then the appearance is suspicious for pyonephrosis, obstruction, and possibly pyelonephritis/renal abscess. 2. Left renal calculi including some  speckled calcific densities along the collecting system. 3. Enlarged periaortic lymph nodes at the level of the left kidney, including a 1.6 cm node. Likely reactive, less likely due to malignancy. Follow up to ensure clearance recommended. 4. Low-density blood pool favors anemia. 5. Peripheral airspace opacities in both lower lobes appear to have associated volume  loss and accordingly favor atelectasis over pneumonia. 6. Small type 1 hiatal hernia. 7. Supraumbilical hernia contains adipose tissue. 8. Moderate to prominent degenerative hip arthropathy bilaterally. 9. Lower lumbar degenerative facet arthropathy. 10. Aortic Atherosclerosis (ICD10-I70.0) and Emphysema (ICD10-J43.9). Electronically Signed   By: Ryan Salvage M.D.   On: 09/28/2023 17:27   DG Chest Portable 1 View Result Date: 09/28/2023 CLINICAL DATA:  Short of breath EXAM: PORTABLE CHEST 1 VIEW COMPARISON:  None Available. FINDINGS: Normal mediastinum and cardiac silhouette. Normal pulmonary vasculature. No evidence of effusion, infiltrate, or pneumothorax. No acute bony abnormality. IMPRESSION: No acute cardiopulmonary process. Electronically Signed   By: Jackquline Boxer M.D.   On: 09/28/2023 15:17   Assessment/Plan Hailey Vance is a 58 y.o. female with medical history significant for HTN, osteoarthritis, OSA, anxiety and depression, GERD, neuropathy, morbid obesity s/p gastric bypass and chronic back pain who presented to the ED for evaluation of left flank pain and admitted for acute pyelonephritis.  # Acute pyelonephritis - Pt presented with progressive left flank pain, dysuria, nausea, vomiting and chills - Urinalysis shows signs of infection - CT renal stone shows findings suspicious for pyelonephritis/renal abscess - Continue IV rocephin  - IV Dilaudid  as needed for pain - F/u urine culture and blood culture - Trend CBC and fever curve  # AKI - Creatinine elevated to 2.04, from normal baseline - Likely secondary to  hypovolemia and mild left hydronephrosis - Start IV NS at 125 cc/hr for 1 day - Trend renal function - Avoid nephrotoxic meds  # Hydronephrosis - CT renal stone shows left hydronephrosis with abnormal gas with complex high density material in the left collecting system suspicious for pyonephrosis, obstruction and possibly pyelonephritis/renal abscess - Patient with acute kidney injury on admission but no urinary retention - Urology consulted, will see in a.m.  # HTN - Slightly elevated with SBP in the 130s to 140s - Hold lisinopril  in setting of AKI - Continue amlodipine  and Toprol  XL  # Neuropathy - Continue gabapentin   # Anxiety and depression - Continue BuSpar  and duloxetine   # OSA - CPAP at bedtime  # GERD - Continue famotidine  and Protonix   # Class III obesity Body mass index is 46.23 kg/m. Filed Weights   09/28/23 2033  Weight: 118.4 kg  - F/u with PCP for weight lost and nutrition counseling  DVT prophylaxis: Lovenox      Code Status: Full Code  Consults called: Urology  Family Communication: Discussed admission with daughter at bedside  Severity of Illness: The appropriate patient status for this patient is INPATIENT. Inpatient status is judged to be reasonable and necessary in order to provide the required intensity of service to ensure the patient's safety. The patient's presenting symptoms, physical exam findings, and initial radiographic and laboratory data in the context of their chronic comorbidities is felt to place them at high risk for further clinical deterioration. Furthermore, it is not anticipated that the patient will be medically stable for discharge from the hospital within 2 midnights of admission.   * I certify that at the point of admission it is my clinical judgment that the patient will require inpatient hospital care spanning beyond 2 midnights from the point of admission due to high intensity of service, high risk for further deterioration  and high frequency of surveillance required.*  Level of care: Telemetry   This record has been created using Conservation officer, historic buildings. Errors have been sought and corrected, but may not always be located. Such creation errors  do not reflect on the standard of care.   Lou Claretta HERO, MD 09/28/2023, 9:45 PM [facility] Pager: [phone] Isaiah 41:10   If 7PM-7AM, please contact night-coverage [email or web address] Password [email or web address]

## 2023-09-28 NOTE — ED Notes (Signed)
 Unable to obtain 2nd set of cultures, second line infiltrated, unable to get another line

## 2023-09-29 ENCOUNTER — Encounter (HOSPITAL_COMMUNITY)
Admission: EM | Disposition: A | Discharge status: Home Health | Payer: Self-pay | Source: Home / Self Care | Attending: Family Medicine

## 2023-09-29 ENCOUNTER — Inpatient Hospital Stay (HOSPITAL_COMMUNITY): Admitting: Anesthesiology

## 2023-09-29 ENCOUNTER — Inpatient Hospital Stay (HOSPITAL_COMMUNITY)

## 2023-09-29 DIAGNOSIS — F418 Other specified anxiety disorders: Secondary | ICD-10-CM | POA: Diagnosis not present

## 2023-09-29 DIAGNOSIS — G4730 Sleep apnea, unspecified: Secondary | ICD-10-CM

## 2023-09-29 DIAGNOSIS — N1330 Unspecified hydronephrosis: Secondary | ICD-10-CM | POA: Diagnosis not present

## 2023-09-29 DIAGNOSIS — I10 Essential (primary) hypertension: Secondary | ICD-10-CM

## 2023-09-29 DIAGNOSIS — N390 Urinary tract infection, site not specified: Secondary | ICD-10-CM

## 2023-09-29 DIAGNOSIS — N10 Acute tubulo-interstitial nephritis: Secondary | ICD-10-CM | POA: Diagnosis not present

## 2023-09-29 DIAGNOSIS — N179 Acute kidney failure, unspecified: Principal | ICD-10-CM

## 2023-09-29 DIAGNOSIS — N136 Pyonephrosis: Secondary | ICD-10-CM | POA: Insufficient documentation

## 2023-09-29 HISTORY — PX: CYSTOSCOPY WITH RETROGRADE PYELOGRAM, URETEROSCOPY AND STENT PLACEMENT: SHX5789

## 2023-09-29 LAB — SURGICAL PCR SCREEN
MRSA, PCR: NEGATIVE
Staphylococcus aureus: NEGATIVE

## 2023-09-29 LAB — CBC
HCT: 30.2 % — ABNORMAL LOW (ref 36.0–46.0)
Hemoglobin: 9.9 g/dL — ABNORMAL LOW (ref 12.0–15.0)
MCH: 33.2 pg (ref 26.0–34.0)
MCHC: 32.8 g/dL (ref 30.0–36.0)
MCV: 101.3 fL — ABNORMAL HIGH (ref 80.0–100.0)
Platelets: 247 K/uL (ref 150–400)
RBC: 2.98 MIL/uL — ABNORMAL LOW (ref 3.87–5.11)
RDW: 13.5 % (ref 11.5–15.5)
WBC: 16.2 K/uL — ABNORMAL HIGH (ref 4.0–10.5)
nRBC: 0 % (ref 0.0–0.2)

## 2023-09-29 LAB — BASIC METABOLIC PANEL WITH GFR
Anion gap: 8 (ref 5–15)
BUN: 21 mg/dL — ABNORMAL HIGH (ref 6–20)
CO2: 22 mmol/L (ref 22–32)
Calcium: 7.7 mg/dL — ABNORMAL LOW (ref 8.9–10.3)
Chloride: 102 mmol/L (ref 98–111)
Creatinine, Ser: 1.62 mg/dL — ABNORMAL HIGH (ref 0.44–1.00)
GFR, Estimated: 36 mL/min — ABNORMAL LOW (ref >60)
Glucose, Bld: 124 mg/dL — ABNORMAL HIGH (ref 70–99)
Potassium: 4.1 mmol/L (ref 3.5–5.1)
Sodium: 132 mmol/L — ABNORMAL LOW (ref 135–145)

## 2023-09-29 LAB — HIV ANTIBODY (ROUTINE TESTING W REFLEX): HIV Screen 4th Generation wRfx: NONREACTIVE

## 2023-09-29 SURGERY — CYSTOURETEROSCOPY, WITH RETROGRADE PYELOGRAM AND STENT INSERTION
Anesthesia: General | Site: Ureter | Laterality: Left

## 2023-09-29 SURGERY — CYSTOSCOPY, WITH RETROGRADE PYELOGRAM AND URETERAL STENT INSERTION
Anesthesia: General | Laterality: Left

## 2023-09-29 MED ORDER — SODIUM CHLORIDE 0.9 % IR SOLN
Status: DC | PRN
  Administered 2023-09-29: 1000 mL

## 2023-09-29 MED ORDER — FENTANYL CITRATE (PF) 100 MCG/2ML IJ SOLN
INTRAMUSCULAR | Status: AC
Start: 2023-09-29 — End: 2023-09-29
  Filled 2023-09-29: qty 2

## 2023-09-29 MED ORDER — MIDAZOLAM HCL 2 MG/2ML IJ SOLN
INTRAMUSCULAR | Status: AC
  Filled 2023-09-29: qty 2

## 2023-09-29 MED ORDER — PROPOFOL 10 MG/ML IV BOLUS
INTRAVENOUS | Status: AC
Start: 2023-09-29 — End: 2023-09-29
  Filled 2023-09-29: qty 20

## 2023-09-29 MED ORDER — PANTOPRAZOLE SODIUM 40 MG PO TBEC
40.0000 mg | DELAYED_RELEASE_TABLET | Freq: Every day | ORAL | Status: DC
  Administered 2023-09-29 – 2023-10-01 (×3): 40 mg via ORAL
  Filled 2023-09-29 (×3): qty 1

## 2023-09-29 MED ORDER — CHLORHEXIDINE GLUCONATE CLOTH 2 % EX PADS
6.0000 | MEDICATED_PAD | Freq: Every day | CUTANEOUS | Status: DC
  Administered 2023-09-29 – 2023-10-01 (×3): 6 via TOPICAL

## 2023-09-29 MED ORDER — SODIUM CHLORIDE 0.9 % IV SOLN
2.0000 g | INTRAVENOUS | Status: DC
  Administered 2023-09-29 – 2023-10-01 (×3): 2 g via INTRAVENOUS
  Filled 2023-09-29 (×3): qty 20

## 2023-09-29 MED ORDER — SODIUM CHLORIDE 0.9 % IV SOLN: 1.0000 g | INTRAVENOUS | Status: DC

## 2023-09-29 MED ORDER — MIDAZOLAM HCL 2 MG/2ML IJ SOLN
INTRAMUSCULAR | Status: DC | PRN
Start: 2023-09-29 — End: 2023-09-29
  Administered 2023-09-29: 2 mg via INTRAVENOUS

## 2023-09-29 MED ORDER — METOPROLOL SUCCINATE ER 50 MG PO TB24
100.0000 mg | ORAL_TABLET | Freq: Every day | ORAL | Status: DC
  Administered 2023-09-29 – 2023-09-30 (×2): 100 mg via ORAL
  Filled 2023-09-29 (×2): qty 2

## 2023-09-29 MED ORDER — FAMOTIDINE 20 MG PO TABS
20.0000 mg | ORAL_TABLET | Freq: Every day | ORAL | Status: DC
  Administered 2023-09-29 – 2023-09-30 (×2): 20 mg via ORAL
  Filled 2023-09-29 (×2): qty 1

## 2023-09-29 MED ORDER — HYDROCODONE-ACETAMINOPHEN 5-325 MG PO TABS
1.0000 | ORAL_TABLET | Freq: Four times a day (QID) | ORAL | Status: DC | PRN
  Administered 2023-09-29: 1 via ORAL
  Filled 2023-09-29: qty 1

## 2023-09-29 MED ORDER — AMLODIPINE BESYLATE 10 MG PO TABS
10.0000 mg | ORAL_TABLET | Freq: Every day | ORAL | Status: DC
  Administered 2023-09-29 – 2023-09-30 (×2): 10 mg via ORAL
  Filled 2023-09-29 (×2): qty 1

## 2023-09-29 MED ORDER — ADULT MULTIVITAMIN W/MINERALS CH
1.0000 | ORAL_TABLET | Freq: Every day | ORAL | Status: DC
  Administered 2023-09-29 – 2023-10-01 (×3): 1 via ORAL
  Filled 2023-09-29 (×3): qty 1

## 2023-09-29 MED ORDER — DEXAMETHASONE SODIUM PHOSPHATE 10 MG/ML IJ SOLN
INTRAMUSCULAR | Status: DC | PRN
  Administered 2023-09-29: 10 mg via INTRAVENOUS

## 2023-09-29 MED ORDER — DULOXETINE HCL 60 MG PO CPEP
60.0000 mg | ORAL_CAPSULE | Freq: Every day | ORAL | Status: DC
  Administered 2023-09-29 – 2023-10-01 (×3): 60 mg via ORAL
  Filled 2023-09-29 (×3): qty 1

## 2023-09-29 MED ORDER — ONDANSETRON HCL 4 MG/2ML IJ SOLN
INTRAMUSCULAR | Status: DC | PRN
  Administered 2023-09-29: 4 mg via INTRAVENOUS

## 2023-09-29 MED ORDER — GABAPENTIN 300 MG PO CAPS
300.0000 mg | ORAL_CAPSULE | Freq: Three times a day (TID) | ORAL | Status: DC
  Administered 2023-09-29 – 2023-10-01 (×8): 300 mg via ORAL
  Filled 2023-09-29 (×8): qty 1

## 2023-09-29 MED ORDER — FENTANYL CITRATE PF 50 MCG/ML IJ SOSY: 25.0000 ug | PREFILLED_SYRINGE | INTRAMUSCULAR | Status: DC | PRN

## 2023-09-29 MED ORDER — BUSPIRONE HCL 5 MG PO TABS
7.5000 mg | ORAL_TABLET | Freq: Two times a day (BID) | ORAL | Status: DC
Start: 2023-09-29 — End: 2023-10-01
  Administered 2023-09-29 – 2023-10-01 (×5): 7.5 mg via ORAL
  Filled 2023-09-29 (×5): qty 2

## 2023-09-29 MED ORDER — LIDOCAINE HCL (PF) 2 % IJ SOLN
INTRAMUSCULAR | Status: DC | PRN
  Administered 2023-09-29: 60 mg via INTRADERMAL

## 2023-09-29 MED ORDER — ONDANSETRON HCL 4 MG/2ML IJ SOLN: 4.0000 mg | Freq: Four times a day (QID) | INTRAMUSCULAR | Status: DC | PRN

## 2023-09-29 MED ORDER — IOHEXOL 300 MG/ML  SOLN
INTRAMUSCULAR | Status: DC | PRN
  Administered 2023-09-29: 12 mL via URETHRAL

## 2023-09-29 MED ORDER — CENTRUM PO CHEW: 1.0000 | CHEWABLE_TABLET | Freq: Every day | ORAL | Status: DC

## 2023-09-29 MED ORDER — STERILE WATER FOR IRRIGATION IR SOLN
Status: DC | PRN
Start: 2023-09-29 — End: 2023-09-29
  Administered 2023-09-29: 500 mL

## 2023-09-29 MED ORDER — PHENYLEPHRINE 80 MCG/ML (10ML) SYRINGE FOR IV PUSH (FOR BLOOD PRESSURE SUPPORT)
PREFILLED_SYRINGE | INTRAVENOUS | Status: DC | PRN
  Administered 2023-09-29 (×2): 160 ug via INTRAVENOUS
  Administered 2023-09-29: 240 ug via INTRAVENOUS
  Administered 2023-09-29: 160 ug via INTRAVENOUS

## 2023-09-29 MED ORDER — FENTANYL CITRATE (PF) 250 MCG/5ML IJ SOLN
INTRAMUSCULAR | Status: DC | PRN
  Administered 2023-09-29 (×2): 25 ug via INTRAVENOUS

## 2023-09-29 MED ORDER — OXYCODONE HCL 5 MG/5ML PO SOLN: 5.0000 mg | Freq: Once | ORAL | Status: DC | PRN

## 2023-09-29 MED ORDER — LACTATED RINGERS IV SOLN: INTRAVENOUS | Status: DC | PRN

## 2023-09-29 MED ORDER — OXYBUTYNIN CHLORIDE 5 MG PO TABS: 5.0000 mg | ORAL_TABLET | Freq: Three times a day (TID) | ORAL | Status: DC | PRN

## 2023-09-29 MED ORDER — PROPOFOL 10 MG/ML IV BOLUS
INTRAVENOUS | Status: DC | PRN
  Administered 2023-09-29: 170 mg via INTRAVENOUS

## 2023-09-29 MED ORDER — OXYCODONE HCL 5 MG PO TABS: 5.0000 mg | ORAL_TABLET | Freq: Once | ORAL | Status: DC | PRN

## 2023-09-29 MED ORDER — SODIUM CHLORIDE 0.9 % IR SOLN
Status: DC | PRN
  Administered 2023-09-29: 3000 mL

## 2023-09-29 SURGICAL SUPPLY — 24 items
BAG URINE DRAIN 2000ML AR STRL (UROLOGICAL SUPPLIES) IMPLANT
BAG URO CATCHER STRL LF (MISCELLANEOUS) ×2 IMPLANT
BASKET ZERO TIP NITINOL 2.4FR (BASKET) IMPLANT
CATH FOLEY 2WAY SLVR 5CC 18FR (CATHETERS) IMPLANT
CATH URETL OPEN 5X70 (CATHETERS) IMPLANT
CLOTH BEACON ORANGE TIMEOUT ST (SAFETY) ×2 IMPLANT
GLOVE BIO SURGEON STRL SZ8 (GLOVE) IMPLANT
GLOVE BIOGEL PI IND STRL 7.5 (GLOVE) IMPLANT
GLOVE BIOGEL PI IND STRL 8 (GLOVE) IMPLANT
GLOVE SURG LX STRL 8.0 MICRO (GLOVE) ×2 IMPLANT
GOWN STRL REUS W/ TWL XL LVL3 (GOWN DISPOSABLE) ×2 IMPLANT
GUIDEWIRE STR DUAL SENSOR (WIRE) ×2 IMPLANT
GUIDEWIRE ZIPWRE .038 STRAIGHT (WIRE) IMPLANT
KIT TURNOVER KIT A (KITS) ×2 IMPLANT
MANIFOLD NEPTUNE II (INSTRUMENTS) ×2 IMPLANT
PACK CYSTO (CUSTOM PROCEDURE TRAY) ×2 IMPLANT
SHEATH NAVIGATOR HD 11/13X36 (SHEATH) IMPLANT
SHEATH NAVIGATOR HD 12/14X36 (SHEATH) IMPLANT
SHEATH URETERAL FLEX 12X35 (SHEATH) IMPLANT
STENT URET 6FRX26 CONTOUR (STENTS) IMPLANT
SYR 10ML LL (SYRINGE) IMPLANT
TRACTIP FLEXIVA PULS ID 200XHI (Laser) IMPLANT
TUBING CONNECTING 10 (TUBING) ×2 IMPLANT
TUBING UROLOGY SET (TUBING) ×2 IMPLANT

## 2023-09-29 NOTE — Progress Notes (Signed)
 Per unit RN pt IVF were d/c and she only has abx that are not due until tomorrow. Pt decided to wait since current IV is still working and nothing is running continuous. Unit RN to place consult if needed.

## 2023-09-29 NOTE — Progress Notes (Signed)
 PROGRESS NOTE    Hailey Vance  FMW:[id number] DOB: 01/26/1966 DOA: 09/28/2023 PCP: Jerome Heron Ruth, PA-C   Brief Narrative:  Hailey Vance is a 58 y.o. female with medical history significant for HTN, osteoarthritis, OSA, anxiety and depression, kidney stones, GERD, neuropathy, morbid obesity s/p gastric bypass and chronic back pain who presented to the ED for evaluation of left flank pain.  Imaging in the ED confirmed pyelonephritis/renal abscess, urology performed cystoscopy with stent placement overnight on the 28th/29th with marked improvement in symptoms.    Assessment & Plan:   Principal Problem:   Acute pyelonephritis Active Problems:   Obesity, Class III, BMI 40-49.9 (morbid obesity)   Hydronephrosis of left kidney   Acute pyonephrosis   Urinary tract infection without hematuria   AKI (acute kidney injury) (HCC)  Severe Sepsis secondary to acute pyelonephritis Profound left hydronephrosis -Tachypnea, leukocytosis with notable source and AKI - Cystoscopy with noted frank purulent drainage of UO - Insertion of left ureteral stent (24F x 26 cm) - Continue broad-spectrum antibiotics with ceftriaxone , follow cultures - Pain markedly improving, de-escalate IV narcotics transition to p.o. hydrocodone  today with plans to discontinue the next 24 to 48 hours   AKI, secondary to above - Creatinine elevated to 2.04, from normal baseline - Secondary to prerenal hypovolemia as well as left hydronephrosis/obstruction - Continue IV fluids -advance diet as tolerated  HTN - Hold lisinopril  in setting of AKI - Continue amlodipine  and Toprol  XL given severe sepsis criteria, resume as appropriate  Anxiety and depression - Continue BuSpar  and duloxetine  OSA - CPAP at bedtime GERD - Continue famotidine  and Protonix    Class III obesity Body mass index is 46.23 kg/m.   DVT prophylaxis: enoxaparin  (LOVENOX ) injection 40 mg Start: 09/28/23 2200   Code Status:   Code  Status: Full Code  Family Communication: At bedside  Status is: Inpatient  Dispo: The patient is from: Home              Anticipated d/c is to: Home              Anticipated d/c date is: 24 to 48 hours              Patient currently not medically stable for discharge  Consultants:  Urology  Procedures:  Cystoscopy with left ureteral stent placement  Antimicrobials:  Ceftriaxone   Subjective: No acute issues or events overnight tolerated procedure well denies nausea vomiting diarrhea constipation headache fevers chills or chest pain.  Flank pain ongoing but markedly improved from prior  Objective: Vitals:   09/29/23 0345 09/29/23 0400 09/29/23 0415 09/29/23 0445  BP: 113/69 113/72 116/77 114/71  Pulse: 92 85 84 80  Resp: 19 (!) 21 19 16   Temp: 99.2 F (37.3 C)  99.5 F (37.5 C) 97.8 F (36.6 C)  TempSrc:    Oral  SpO2: 97% 99% 98% 90%  Weight:      Height:        Intake/Output Summary (Last 24 hours) at 09/29/2023 0747 Last data filed at 09/29/2023 0400 Gross per 24 hour  Intake 1530 ml  Output 310 ml  Net 1220 ml   Filed Weights   09/28/23 2033  Weight: 118.4 kg    Examination:  General:  Pleasantly resting in bed, No acute distress. HEENT:  Normocephalic atraumatic.  Sclerae nonicteric, noninjected.  Extraocular movements intact bilaterally. Neck:  Without mass or deformity.  Trachea is midline. Lungs:  Clear to auscultate bilaterally without rhonchi, wheeze, or rales.  Heart:  Regular rate and rhythm.  Without murmurs, rubs, or gallops. Abdomen:  Soft, nontender, nondistended.  Without guarding or rebound. Extremities: Without cyanosis, clubbing, edema, or obvious deformity. Skin:  Warm and dry, no erythema.  Data Reviewed: I have personally reviewed following labs and imaging studies  CBC: Recent Labs  Lab 09/28/23 1413 09/29/23 0551  WBC 17.2* 16.2*  NEUTROABS 14.4*  --   HGB 11.1* 9.9*  HCT 33.7* 30.2*  MCV 102.1* 101.3*  PLT 251 247    Basic Metabolic Panel: Recent Labs  Lab 09/28/23 1413 09/29/23 0551  NA 132* 132*  K 3.4* 4.1  CL 97* 102  CO2 24 22  GLUCOSE 158* 124*  BUN 19 21*  CREATININE 2.04* 1.62*  CALCIUM 8.2* 7.7*   GFR: Estimated Creatinine Clearance: 46.5 mL/min (A) (by C-G formula based on SCr of 1.62 mg/dL (H)). Liver Function Tests: Recent Labs  Lab 09/28/23 1414  AST 32  ALT 22  ALKPHOS 93  BILITOT 1.7*  PROT 6.7  ALBUMIN  2.7*   Sepsis Labs: Recent Labs  Lab 09/28/23 1448 09/28/23 1719  LATICACIDVEN 1.8 1.3    Recent Results (from the past 240 hours)  Surgical pcr screen     Status: None   Collection Time: 09/29/23  2:56 AM   Specimen: Nasal Mucosa; Nasal Swab  Result Value Ref Range Status   MRSA, PCR NEGATIVE NEGATIVE Final   Staphylococcus aureus NEGATIVE NEGATIVE Final    Comment: (NOTE) The Xpert SA Assay (FDA approved for NASAL specimens in patients 29 years of age and older), is one component of a comprehensive surveillance program. It is not intended to diagnose infection nor to guide or monitor treatment. Performed at [facility], [address], [place], [place] [address]      Radiology Studies: DG C-Arm 1-60 Min-No Report Result Date: 09/29/2023 Fluoroscopy was utilized by the requesting physician.  No radiographic interpretation.   CT Renal Stone Study Result Date: 09/28/2023 CLINICAL DATA:  Abdominal/flank pain EXAM: CT ABDOMEN AND PELVIS WITHOUT CONTRAST TECHNIQUE: Multidetector CT imaging of the abdomen and pelvis was performed following the standard protocol without IV contrast. RADIATION DOSE REDUCTION: This exam was performed according to the departmental dose-optimization program which includes automated exposure control, adjustment of the mA and/or kV according to patient size and/or use of iterative reconstruction technique. COMPARISON:  Ultrasound 04/26/2021 FINDINGS: Lower chest: Peripheral airspace opacities in both lower lobes  appear to have associated volume loss and accordingly favor atelectasis over pneumonia. Low-density blood pool favors anemia. Small type 1 hiatal hernia. Hepatobiliary: Unremarkable Pancreas: Unremarkable Spleen: Unremarkable Adrenals/Urinary Tract: Adrenal glands unremarkable. Left hydronephrosis with complex high density material in the left collecting system along with abnormal gas in the left collecting system. Large simple right renal cysts and various left renal cysts which measure less than 20 Hounsfield units but with some cyst demonstrating complexity such as wall calcification or thick margins. Left renal calculi including some speckled calcific densities along the collecting system. Left perinephric stranding and mild proximal periureteral stranding. Stomach/Bowel: Sleeve partial gastrectomy. Vascular/Lymphatic: Mild abdominal aortic atherosclerosis. Enlarged periaortic lymph nodes at the level of the left kidney, including a 1.6 cm node on image 32 series 2. Reproductive: Uterus absent. Other: No supplemental non-categorized findings. Musculoskeletal: Lower lumbar degenerative facet arthropathy. Supraumbilical hernia contains adipose tissue as shown on image 107 series 6. Moderate to prominent degenerative hip arthropathy bilaterally. IMPRESSION: 1. Left hydronephrosis with complex high density material in the left collecting system along  with abnormal gas in the left collecting system. If the patient has not had recent urologic instrumentation of the left kidney and/or lithotripsy, then the appearance is suspicious for pyonephrosis, obstruction, and possibly pyelonephritis/renal abscess. 2. Left renal calculi including some speckled calcific densities along the collecting system. 3. Enlarged periaortic lymph nodes at the level of the left kidney, including a 1.6 cm node. Likely reactive, less likely due to malignancy. Follow up to ensure clearance recommended. 4. Low-density blood pool favors anemia. 5.  Peripheral airspace opacities in both lower lobes appear to have associated volume loss and accordingly favor atelectasis over pneumonia. 6. Small type 1 hiatal hernia. 7. Supraumbilical hernia contains adipose tissue. 8. Moderate to prominent degenerative hip arthropathy bilaterally. 9. Lower lumbar degenerative facet arthropathy. 10. Aortic Atherosclerosis (ICD10-I70.0) and Emphysema (ICD10-J43.9). Electronically Signed   By: Ryan Salvage M.D.   On: 09/28/2023 17:27   DG Chest Portable 1 View Result Date: 09/28/2023 CLINICAL DATA:  Short of breath EXAM: PORTABLE CHEST 1 VIEW COMPARISON:  None Available. FINDINGS: Normal mediastinum and cardiac silhouette. Normal pulmonary vasculature. No evidence of effusion, infiltrate, or pneumothorax. No acute bony abnormality. IMPRESSION: No acute cardiopulmonary process. Electronically Signed   By: Jackquline Boxer M.D.   On: 09/28/2023 15:17   Scheduled Meds:  amLODipine   10 mg Oral Daily   busPIRone   7.5 mg Oral BID   Chlorhexidine  Gluconate Cloth  6 each Topical Daily   DULoxetine   60 mg Oral Daily   enoxaparin  (LOVENOX ) injection  40 mg Subcutaneous Q24H   famotidine   20 mg Oral QHS   gabapentin   300 mg Oral TID   metoprolol  succinate  100 mg Oral Daily   multivitamin with minerals  1 tablet Oral Daily   pantoprazole   40 mg Oral Daily   Continuous Infusions:  sodium chloride  Stopped (09/29/23 0304)   cefTRIAXone  (ROCEPHIN )  IV       LOS: 1 day   Time spent:  Elsie JAYSON Montclair, DO [facility]  If 7PM-7AM, please contact night-coverage [email or web address]  09/29/2023, 7:47 AM

## 2023-09-29 NOTE — Progress Notes (Signed)
   09/29/23 2214  BiPAP/CPAP/SIPAP  BiPAP/CPAP/SIPAP Pt Type Adult  BiPAP/CPAP/SIPAP Resmed  Mask Type Full face mask  Dentures removed? Not applicable  Mask Size Medium  FiO2 (%) 21 %  Patient Home Machine No  Patient Home Mask No  Patient Home Tubing No  Auto Titrate Yes  Minimum cmH2O 4 cmH2O  Maximum cmH2O 20 cmH2O  Device Plugged into RED Power Outlet Yes

## 2023-09-29 NOTE — Op Note (Signed)
 OPERATIVE NOTE   Patient Name: Hailey Vance  MRN: [id number]   Date of Procedure: 09/29/23    Preoperative diagnosis:  Left hydronephrosis/pyonephrosis UTI  Postoperative diagnosis:  Left pyonephrosis UTI  Procedure:  Cystoscopy Left retrograde pyelogram with intraoperative interpretation Insertion of left ureteral stent (25F x 26 cm, tether removed)  Attending: Adine DOROTHA Manly, MD  Anesthesia: General  Estimated blood loss: 0 mL  Fluids: Per anesthesia record  Drains: 25F x 26 cm left ureteral stent (no tether), 27F foley  Specimens:  none  Antibiotics: Rocephin  IV given pre-op  Findings: Normal urethra and bladder; purulent material noted at the left UO; dilation of the left renal pelvis and calyces; purulent drainage from left kidney following stent placement  Indications:  58 year old female presented to the ED with left flank pain x 3 days.  Urinalysis consistent with UTI with many bacteria and positive nitrite.  CT showed left hydronephrosis with air in the left collecting system consistent with pyelonephrosis, left renal cyst, and left renal calculi.  She was started on Rocephin  in the emergency room and admitted.  She presents now for cystoscopy, left retrograde pyelogram, insertion of left ureteral stent.  The procedure including potential risk was discussed with the patient.  She understands wishes to proceed as described.  Description of Procedure:  The patient received IV Rocephin  preoperatively.  She was taken to the operating room suite and properly identified.  After successful induction of a general anesthetic, she was placed in the dorsolithotomy position.  The patient's genital area is prepped and draped in sterile fashion.  A preoperative timeout was performed.  A 21 French rigid cystoscope was passed through the urethra and into the bladder.  No urethral or bladder abnormalities were appreciated.  There was some purulent material noted at the left  UO.  A single orifice was seen bilaterally.  A left retrograde pyelogram was performed for evaluation of the patient's upper tract given the CT findings.  Scout film showed a nonspecific bowel gas pattern and no obvious bony abnormalities.  A calcification was noted in the area of the left renal shadow.  Using a 5 [place] open-ended catheter, contrast was injected into the left ureter.  The left distal mid and proximal ureter were normal in appearance without filling defect.  There was dilation of the left renal pelvis extending to the left calyces.  Some filling defect was noted within the renal pelvis consistent with layering debris.  The open-ended catheter was passed into the renal pelvis.  There was drainage of purulent material around the ureteral catheter and into the bladder.  A sensor wire was passed through the open-ended catheter and curled in a renal calyx.  A 25F x 26 cm double-J stent was then passed over the guidewire and into the left renal pelvis.  A proximal curl was noted.  The tether was removed prior to stent placement.  Good stent position was noted.  There was continued drainage of purulent material through the stent and around the stent.  The cystoscope was removed.  An 63 French Foley catheter was placed with 10 mL of sterile water  in the balloon.  The patient was then extubated and taken to the post anesthesia care unit in stable condition.  Complications: None  Condition: Stable, extubated, transferred to PACU  Plan:  Continue left ureteral stent Continue Foley catheter Continue IV antibiotics pending urine culture Will need outpatient follow-up for stent removal once her infection has been completely treated.

## 2023-09-29 NOTE — Consult Note (Signed)
 Urology Consult   Physician requesting consult: Claretta Alderman, MD  Reason for consult: Left hydronephrosis, UTI  History of Present Illness: Hailey Vance is a 58 y.o. female seen in consultation from Dr. Alderman for evaluation of left hydronephrosis, possible pyonephrosis, and UTI.  She presented to ED yesterday with left flank pain.  Symptoms began 3 days ago.  No fever.  Some chills.  She has had dysuria.  No gross hematuria.   WBC 17.2K U/A:  + nitrite, >50 WBC, many bacterial Cr 2.04 CT renal stone study showed left hydronephrosis, air in left collecting system consistent with pyonephrosis, left renal cysts, and left renal calculi. She was started on IV Rocephin .    She has a history of kidney stones which she passed.  Past Medical History:  Diagnosis Date   Bilateral primary osteoarthritis of knee 03/29/2019   Chronic back pain    Cystocele with rectocele    Generalized anxiety disorder    GERD (gastroesophageal reflux disease)    Headache    otc med prn   Heart murmur    echo in epic 11-26-2017  normal w/ ef 55-60%   History of chest pain    pt referred for atyical chest pain , cardiology evaluation w/ dr berry note in epic 10-25-2014, stated with low risk w/ no ischemia , nuclear ef 70% , done 09-19-2014 in epic,  non-cardiac chest pain   HTN (hypertension)    followed by pcp    (normal nuclear stress test 09-19-2014 in epic)   Low blood potassium    Major depressive disorder    OSA (obstructive sleep apnea)    04/2015 study in e- Severe OSA (AHI = 55.0/hour);   (10-06-2019  per pt has not used cpap since 2019 after gastric bypass)   Osteoarthritis of both hips 06/15/2019   Plantar fasciitis of left foot    S/P gastric bypass 02/15/2018   sleeve   SUI (stress urinary incontinence, female)    Vitamin D  deficiency 09/06/2014   Wears glasses     Past Surgical History:  Procedure Laterality Date   BLADDER SUSPENSION N/A 10/12/2019   Procedure: TRANSVAGINAL TAPE  (TVT) PROCEDURE;  Surgeon: Sarrah Browning, MD;  Location[facility];  Service: Gynecology;  Laterality: N/A;   CESAREAN SECTION W/BTL  1990   COLONOSCOPY WITH PROPOFOL  N/A 08/30/2015   Procedure: COLONOSCOPY WITH PROPOFOL ;  Surgeon: Gustav LULLA Mcgee, MD;  Location: [facility];  Service: Endoscopy;  Laterality: N/A;   CYSTOCELE REPAIR N/A 10/12/2019   Procedure: ANTERIOR (CYSTOCELE);  Surgeon: Sarrah Browning, MD;  Location: [facility];  Service: Gynecology;  Laterality: N/A;   CYSTOSCOPY N/A 05/23/2015   Procedure: CYSTOSCOPY;  Surgeon: Browning Sarrah, MD;  Location: [facility];  Service: Gynecology;  Laterality: N/A;   CYSTOSCOPY N/A 10/12/2019   Procedure: CYSTOSCOPY;  Surgeon: Sarrah Browning, MD;  Location: [facility];  Service: Gynecology;  Laterality: N/A;   HIATAL HERNIA REPAIR N/A 02/15/2018   Procedure: HERNIA REPAIR HIATAL;  Surgeon: Mikell Katz, MD;  Location: [facility];  Service: General;  Laterality: N/A;   HYSTEROSCOPY WITH D & C N/A 01/11/2015   Procedure: DILATATION AND CURETTAGE /HYSTEROSCOPY;  Surgeon: Jolene Gaskins, MD;  Location: [facility];  Service: Gynecology;  Laterality: N/A;   KNEE ARTHROSCOPY Right 2010   LAPAROSCOPIC GASTRIC SLEEVE RESECTION N/A 02/15/2018   Procedure: LAPAROSCOPIC GASTRIC SLEEVE RESECTION WITH UPPER ENDO AND ERAS PATHWAY;  Surgeon: Mikell Katz, MD;  Location: [facility];  Service: General;  Laterality: N/A;   LAPAROSCOPIC OOPHORECTOMY Bilateral 2004   LEFT OOPHORECTOMY AND PARTIAL RIGHT OOPHORECTOMY   LIPOMA EXCISION Left 12/02/2016   Procedure: EXCISION LIPOMA OF LEFT SHOULDER;  Surgeon: Anderson Maude ORN, MD;  Location: [facility];  Service: Orthopedics;  Laterality: Left;   ROBOTIC ASSISTED TOTAL HYSTERECTOMY WITH SALPINGECTOMY Bilateral 05/23/2015   Procedure: ROBOTIC ASSISTED TOTAL HYSTERECTOMY WITH SALPINGECTOMYwith right oophorectomy;  Surgeon: Rosaline Luna, MD;  Location: [facility];   Service: Gynecology;  Laterality: Bilateral;   UMBILICAL HERNIA REPAIR  2002    Medications:  Scheduled Meds:  enoxaparin  (LOVENOX ) injection  40 mg Subcutaneous Q24H   Continuous Infusions:  sodium chloride  125 mL/hr at 09/28/23 2007   cefTRIAXone  (ROCEPHIN )  IV     PRN Meds:.acetaminophen  **OR** acetaminophen , bisacodyl , HYDROmorphone  (DILAUDID ) injection, ondansetron  **OR** ondansetron  (ZOFRAN ) IV, senna-docusate  Allergies:  Allergies  Allergen Reactions   Penicillins Itching and Other (See Comments)    Has patient had a PCN reaction causing immediate rash, facial/tongue/throat swelling, SOB or lightheadedness with hypotension: No Has patient had a PCN reaction causing severe rash involving mucus membranes or skin necrosis: No Has patient had a PCN reaction that required hospitalization No Has patient had a PCN reaction occurring within the last 10 years: No If all of the above answers are NO, then may proceed with Cephalosporin use.     Family History  Problem Relation Age of Onset   Lung cancer Mother 25       dec   Cancer Mother    Hypertension Mother    Arthritis Mother    Cancer Father 74       prostate   Hypertension Father    Leukemia Sister    Hypertension Sister    Arthritis Sister    Hyperlipidemia Sister    Hypertension Brother    Arthritis Brother    Multiple sclerosis Sister    Hypertension Sister    Hypertension Sister    Hypertension Brother    Hypertension Brother    Breast cancer Paternal Aunt    Alzheimer's disease Paternal Aunt    Diabetes Maternal Grandmother    Stroke Maternal Grandmother    Heart attack Paternal Grandmother    Asthma Other     Social History:  reports that she has never smoked. She has never used smokeless tobacco. She reports that she does not drink alcohol and does not use drugs.  ROS: A complete review of systems was performed.  All systems are negative except for pertinent findings as noted.  Physical Exam:   Vital signs in last 24 hours: Temp:  [98.3 F (36.8 C)-99.2 F (37.3 C)] 98.3 F (36.8 C) (07/28 1749) Pulse Rate:  [78-90] 81 (07/28 1916) Resp:  [17-20] 17 (07/28 1916) BP: (139-147)/(90-103) 139/90 (07/28 1749) SpO2:  [85 %-97 %] 93 % (07/28 1916) Weight:  [881.5 kg] 118.4 kg (07/28 2033) GENERAL APPEARANCE:  Well appearing, well developed, well nourished, NAD HEENT:  Atraumatic, normocephalic, oropharynx clear NECK:  Supple  ABDOMEN:  Soft, non-tender, no masses EXTREMITIES:  Moves all extremities well, without clubbing, cyanosis, or edema NEUROLOGIC:  Alert and oriented x 3, CN II-XII grossly intact MENTAL STATUS:  appropriate BACK:  Non-tender to palpation, No CVAT SKIN:  Warm, dry, and intact  Laboratory Data:  Recent Labs    09/28/23 1413  WBC 17.2*  HGB 11.1*  HCT 33.7*  PLT 251    Recent Labs    09/28/23 1413  NA 132*  K 3.4*  CL 97*  GLUCOSE 158*  BUN 19  CALCIUM 8.2*  CREATININE 2.04*     Results for orders placed or performed during the hospital encounter of 09/28/23 (from the past 24 hours)  Basic metabolic panel     Status: Abnormal   Collection Time: 09/28/23  2:13 PM  Result Value Ref Range   Sodium 132 (L) 135 - 145 mmol/L   Potassium 3.4 (L) 3.5 - 5.1 mmol/L   Chloride 97 (L) 98 - 111 mmol/L   CO2 24 22 - 32 mmol/L   Glucose, Bld 158 (H) 70 - 99 mg/dL   BUN 19 6 - 20 mg/dL   Creatinine, Ser 7.95 (H) 0.44 - 1.00 mg/dL   Calcium 8.2 (L) 8.9 - 10.3 mg/dL   GFR, Estimated 28 (L) >60 mL/min   Anion gap 11 5 - 15  CBC     Status: Abnormal   Collection Time: 09/28/23  2:13 PM  Result Value Ref Range   WBC 17.2 (H) 4.0 - 10.5 K/uL   RBC 3.30 (L) 3.87 - 5.11 MIL/uL   Hemoglobin 11.1 (L) 12.0 - 15.0 g/dL   HCT 66.2 (L) 63.9 - 53.9 %   MCV 102.1 (H) 80.0 - 100.0 fL   MCH 33.6 26.0 - 34.0 pg   MCHC 32.9 30.0 - 36.0 g/dL   RDW 86.7 88.4 - 84.4 %   Platelets 251 150 - 400 K/uL   nRBC 0.0 0.0 - 0.2 %  Differential     Status: Abnormal    Collection Time: 09/28/23  2:13 PM  Result Value Ref Range   Neutrophils Relative % 83 %   Neutro Abs 14.4 (H) 1.7 - 7.7 K/uL   Lymphocytes Relative 4 %   Lymphs Abs 0.8 0.7 - 4.0 K/uL   Monocytes Relative 12 %   Monocytes Absolute 2.1 (H) 0.1 - 1.0 K/uL   Eosinophils Relative 0 %   Eosinophils Absolute 0.0 0.0 - 0.5 K/uL   Basophils Relative 0 %   Basophils Absolute 0.0 0.0 - 0.1 K/uL   Immature Granulocytes 1 %   Abs Immature Granulocytes 0.18 (H) 0.00 - 0.07 K/uL  Hepatic function panel     Status: Abnormal   Collection Time: 09/28/23  2:14 PM  Result Value Ref Range   Total Protein 6.7 6.5 - 8.1 g/dL   Albumin  2.7 (L) 3.5 - 5.0 g/dL   AST 32 15 - 41 U/L   ALT 22 0 - 44 U/L   Alkaline Phosphatase 93 38 - 126 U/L   Total Bilirubin 1.7 (H) 0.0 - 1.2 mg/dL   Bilirubin, Direct 0.7 (H) 0.0 - 0.2 mg/dL   Indirect Bilirubin 1.0 (H) 0.3 - 0.9 mg/dL  Troponin I (High Sensitivity)     Status: None   Collection Time: 09/28/23  2:14 PM  Result Value Ref Range   Troponin I (High Sensitivity) 6 <18 ng/L  Urinalysis, Routine w reflex microscopic -Urine, Catheterized     Status: Abnormal   Collection Time: 09/28/23  2:21 PM  Result Value Ref Range   Color, Urine YELLOW YELLOW   APPearance CLOUDY (A) CLEAR   Specific Gravity, Urine 1.013 1.005 - 1.030   pH 5.0 5.0 - 8.0   Glucose, UA NEGATIVE NEGATIVE mg/dL   Hgb urine dipstick NEGATIVE NEGATIVE   Bilirubin Urine NEGATIVE NEGATIVE   Ketones, ur NEGATIVE NEGATIVE mg/dL   Protein, ur 899 (A) NEGATIVE mg/dL   Nitrite POSITIVE (A) NEGATIVE   Leukocytes,Ua LARGE (A) NEGATIVE  RBC / HPF 0-5 0 - 5 RBC/hpf   WBC, UA >50 0 - 5 WBC/hpf   Bacteria, UA MANY (A) NONE SEEN   Squamous Epithelial / HPF 0-5 0 - 5 /HPF   Mucus PRESENT    Amorphous Crystal PRESENT   I-Stat CG4 Lactic Acid     Status: None   Collection Time: 09/28/23  2:48 PM  Result Value Ref Range   Lactic Acid, Venous 1.8 0.5 - 1.9 mmol/L  I-Stat CG4 Lactic Acid     Status:  None   Collection Time: 09/28/23  5:19 PM  Result Value Ref Range   Lactic Acid, Venous 1.3 0.5 - 1.9 mmol/L  Troponin I (High Sensitivity)     Status: None   Collection Time: 09/28/23  5:54 PM  Result Value Ref Range   Troponin I (High Sensitivity) 6 <18 ng/L   No results found for this or any previous visit (from the past 240 hours).  Renal Function: Recent Labs    09/28/23 1413  CREATININE 2.04*   Estimated Creatinine Clearance: 36.9 mL/min (A) (by C-G formula based on SCr of 2.04 mg/dL (H)).  Radiologic Imaging: CT Renal Stone Study Result Date: 09/28/2023 CLINICAL DATA:  Abdominal/flank pain EXAM: CT ABDOMEN AND PELVIS WITHOUT CONTRAST TECHNIQUE: Multidetector CT imaging of the abdomen and pelvis was performed following the standard protocol without IV contrast. RADIATION DOSE REDUCTION: This exam was performed according to the departmental dose-optimization program which includes automated exposure control, adjustment of the mA and/or kV according to patient size and/or use of iterative reconstruction technique. COMPARISON:  Ultrasound 04/26/2021 FINDINGS: Lower chest: Peripheral airspace opacities in both lower lobes appear to have associated volume loss and accordingly favor atelectasis over pneumonia. Low-density blood pool favors anemia. Small type 1 hiatal hernia. Hepatobiliary: Unremarkable Pancreas: Unremarkable Spleen: Unremarkable Adrenals/Urinary Tract: Adrenal glands unremarkable. Left hydronephrosis with complex high density material in the left collecting system along with abnormal gas in the left collecting system. Large simple right renal cysts and various left renal cysts which measure less than 20 Hounsfield units but with some cyst demonstrating complexity such as wall calcification or thick margins. Left renal calculi including some speckled calcific densities along the collecting system. Left perinephric stranding and mild proximal periureteral stranding. Stomach/Bowel:  Sleeve partial gastrectomy. Vascular/Lymphatic: Mild abdominal aortic atherosclerosis. Enlarged periaortic lymph nodes at the level of the left kidney, including a 1.6 cm node on image 32 series 2. Reproductive: Uterus absent. Other: No supplemental non-categorized findings. Musculoskeletal: Lower lumbar degenerative facet arthropathy. Supraumbilical hernia contains adipose tissue as shown on image 107 series 6. Moderate to prominent degenerative hip arthropathy bilaterally. IMPRESSION: 1. Left hydronephrosis with complex high density material in the left collecting system along with abnormal gas in the left collecting system. If the patient has not had recent urologic instrumentation of the left kidney and/or lithotripsy, then the appearance is suspicious for pyonephrosis, obstruction, and possibly pyelonephritis/renal abscess. 2. Left renal calculi including some speckled calcific densities along the collecting system. 3. Enlarged periaortic lymph nodes at the level of the left kidney, including a 1.6 cm node. Likely reactive, less likely due to malignancy. Follow up to ensure clearance recommended. 4. Low-density blood pool favors anemia. 5. Peripheral airspace opacities in both lower lobes appear to have associated volume loss and accordingly favor atelectasis over pneumonia. 6. Small type 1 hiatal hernia. 7. Supraumbilical hernia contains adipose tissue. 8. Moderate to prominent degenerative hip arthropathy bilaterally. 9. Lower lumbar degenerative facet arthropathy. 10. Aortic Atherosclerosis (  ICD10-I70.0) and Emphysema (ICD10-J43.9). Electronically Signed   By: Ryan Salvage M.D.   On: 09/28/2023 17:27   DG Chest Portable 1 View Result Date: 09/28/2023 CLINICAL DATA:  Short of breath EXAM: PORTABLE CHEST 1 VIEW COMPARISON:  None Available. FINDINGS: Normal mediastinum and cardiac silhouette. Normal pulmonary vasculature. No evidence of effusion, infiltrate, or pneumothorax. No acute bony abnormality.  IMPRESSION: No acute cardiopulmonary process. Electronically Signed   By: Jackquline Boxer M.D.   On: 09/28/2023 15:17    I independently reviewed the above imaging studies.  Impression/Recommendation Left hydronephrosis/pyonephrosis UTI  Her CT is concerning for left pyonephrosis.  I recommended urgent surgical management with cystoscopy and left ureteral stent placement for decompression and drainage of the left collecting system.  The procedure and potential risk discussed.  She understands and agrees to proceed.  Procedure: The patient will be scheduled for cystoscopy, left retrograde pyelogram, insertion of left ureteral stent at [facility].  Surgical request is placed with the surgery schedulers and will be scheduled at the patient's/family request. Informed consent is given as documented below. Anesthesia: General  The patient does not have sleep apnea, history of MRSA, history of VRE, history of cardiac device requiring special anesthetic needs. Patient is stable and considered clear for surgical management in an outpatient ambulatory surgery setting as well as inpatient hospital setting.  Consent for Operation or Procedure: Provider Certification I hereby certify that the nature, purpose, benefits, usual and most frequent risks of, and alternatives to, the operation or procedure have been explained to the patient (or person authorized to sign for the patient) either by me as responsible physician or by the provider who is to perform the operation or procedure. Time spent such that the patient/family has had an opportunity to ask questions, and that those questions have been answered. The patient or the patient's representative has been advised that selected tasks may be performed by assistants to the primary health care provider(s). I believe that the patient (or person authorized to sign for the patient) understands what has been explained, and has consented to the operation or  procedure. No guarantees were implied or made.    Adine Jakya Dovidio 09/29/2023, 1:06 AM

## 2023-09-29 NOTE — Plan of Care (Signed)
   Problem: Safety: Goal: Ability to remain free from injury will improve Outcome: Progressing   Problem: Skin Integrity: Goal: Risk for impaired skin integrity will decrease Outcome: Progressing

## 2023-09-29 NOTE — Transfer of Care (Signed)
 Immediate Anesthesia Transfer of Care Note  Patient: Hailey Vance  Procedure(s) Performed: CYSTOSCOPY WITH RETROGRADE PYELOGRAM AND STENT INSERTION (Left: Ureter)  Patient Location: PACU  Anesthesia Type:General  Level of Consciousness: drowsy and patient cooperative  Airway & Oxygen Therapy: Patient Spontanous Breathing  Post-op Assessment: Report given to RN and Post -op Vital signs reviewed and stable  Post vital signs: Reviewed and stable  Last Vitals:  Vitals Value Taken Time  BP 113/69 09/29/23 03:46  Temp    Pulse 88 09/29/23 03:52  Resp 21 09/29/23 03:52  SpO2 100 % 09/29/23 03:52  Vitals shown include unfiled device data.  Last Pain:  Vitals:   09/29/23 0219  TempSrc:   PainSc: 10-Worst pain ever         Complications: No notable events documented.

## 2023-09-29 NOTE — TOC Transition Note (Signed)
 Transition of Care [facility]) - Discharge Note   Patient Details  Name: Hailey Vance MRN: [id number] Date of Birth: 1965/04/14  [facility] CM/SW Contact:  Alfonse JONELLE Rex, RN Phone Number: 09/29/2023, 11:13 AM   Clinical Narrative:  Met with patient at bedside to introduce role of TOC/NCM and review for dc planning, pt confirmed she has an established PCP, no current home care services, has a RW and Paediatric nurse, reports  she resides with her family and feels safe returning home. TOC will continue to follow.         Barriers to Discharge: Continued Medical Work up   Patient Goals and CMS Choice Patient states their goals for this hospitalization and ongoing recovery are:: return home          Discharge Placement                       Discharge Plan and Services Additional resources added to the After Visit Summary for                                       Social Drivers of Health (SDOH) Interventions SDOH Screenings   Food Insecurity: No Food Insecurity (09/28/2023)  Housing: Low Risk  (09/28/2023)  Transportation Needs: No Transportation Needs (09/28/2023)  Utilities: Not At Risk (09/28/2023)  Alcohol Screen: Low Risk  (01/01/2023)  Depression (PHQ2-9): Low Risk  (01/01/2023)  Financial Resource Strain: Low Risk  (01/01/2023)  Physical Activity: Inactive (01/01/2023)  Social Connections: Socially Isolated (01/01/2023)  Stress: No Stress Concern Present (01/01/2023)  Tobacco Use: Low Risk  (09/28/2023)  Health Literacy: Adequate Health Literacy (01/01/2023)     Readmission Risk Interventions    09/29/2023   11:12 AM  Readmission Risk Prevention Plan  Post Dischage Appt Complete  Medication Screening Complete  Transportation Screening Complete

## 2023-09-29 NOTE — Anesthesia Procedure Notes (Signed)
 Procedure Name: LMA Insertion Date/Time: 09/29/2023 3:12 AM  Performed by: Claudene Hoy CROME, CRNAPre-anesthesia Checklist: Patient identified, Emergency Drugs available, Suction available and Patient being monitored Patient Re-evaluated:Patient Re-evaluated prior to induction Oxygen Delivery Method: Circle System Utilized Preoxygenation: Pre-oxygenation with 100% oxygen Induction Type: IV induction Ventilation: Mask ventilation without difficulty LMA: LMA inserted LMA Size: 4.0 Number of attempts: 1 Placement Confirmation: positive ETCO2 Tube secured with: Tape Dental Injury: Teeth and Oropharynx as per pre-operative assessment

## 2023-09-29 NOTE — Plan of Care (Signed)

## 2023-09-29 NOTE — Anesthesia Preprocedure Evaluation (Signed)
 Anesthesia Evaluation  Patient identified by MRN, date of birth, ID band Patient awake    Reviewed: Allergy & Precautions, H&P , NPO status , Patient's Chart, lab work & pertinent test results  Airway Mallampati: II   Neck ROM: full    Dental   Pulmonary sleep apnea    breath sounds clear to auscultation       Cardiovascular hypertension,  Rhythm:regular Rate:Normal     Neuro/Psych  Headaches PSYCHIATRIC DISORDERS Anxiety Depression       GI/Hepatic ,GERD  ,,  Endo/Other    Class 3 obesity  Renal/GU Renal diseasestones     Musculoskeletal  (+) Arthritis ,  Fibromyalgia -  Abdominal   Peds  Hematology   Anesthesia Other Findings   Reproductive/Obstetrics                              Anesthesia Physical Anesthesia Plan  ASA: 3 and emergent  Anesthesia Plan: General   Post-op Pain Management:    Induction: Intravenous  PONV Risk Score and Plan: 3 and Ondansetron , Dexamethasone , Midazolam  and Treatment may vary due to age or medical condition  Airway Management Planned: Oral ETT  Additional Equipment:   Intra-op Plan:   Post-operative Plan: Extubation in OR  Informed Consent: I have reviewed the patients History and Physical, chart, labs and discussed the procedure including the risks, benefits and alternatives for the proposed anesthesia with the patient or authorized representative who has indicated his/her understanding and acceptance.     Dental advisory given  Plan Discussed with: CRNA, Anesthesiologist and Surgeon  Anesthesia Plan Comments:         Anesthesia Quick Evaluation

## 2023-09-29 NOTE — Plan of Care (Signed)

## 2023-09-30 ENCOUNTER — Encounter (HOSPITAL_COMMUNITY): Payer: Self-pay | Admitting: Urology

## 2023-09-30 DIAGNOSIS — N10 Acute tubulo-interstitial nephritis: Secondary | ICD-10-CM | POA: Diagnosis not present

## 2023-09-30 DIAGNOSIS — N179 Acute kidney failure, unspecified: Secondary | ICD-10-CM | POA: Diagnosis not present

## 2023-09-30 DIAGNOSIS — N136 Pyonephrosis: Secondary | ICD-10-CM | POA: Diagnosis not present

## 2023-09-30 LAB — URINE CULTURE
Culture: >=100000 — AB
Special Requests: NORMAL

## 2023-09-30 LAB — CBC
HCT: 30.8 % — ABNORMAL LOW (ref 36.0–46.0)
Hemoglobin: 9.7 g/dL — ABNORMAL LOW (ref 12.0–15.0)
MCH: 31.9 pg (ref 26.0–34.0)
MCHC: 31.5 g/dL (ref 30.0–36.0)
MCV: 101.3 fL — ABNORMAL HIGH (ref 80.0–100.0)
Platelets: 289 K/uL (ref 150–400)
RBC: 3.04 MIL/uL — ABNORMAL LOW (ref 3.87–5.11)
RDW: 13.6 % (ref 11.5–15.5)
WBC: 16.5 K/uL — ABNORMAL HIGH (ref 4.0–10.5)
nRBC: 0 % (ref 0.0–0.2)

## 2023-09-30 LAB — BASIC METABOLIC PANEL WITH GFR
Anion gap: 9 (ref 5–15)
BUN: 27 mg/dL — ABNORMAL HIGH (ref 6–20)
CO2: 20 mmol/L — ABNORMAL LOW (ref 22–32)
Calcium: 7.8 mg/dL — ABNORMAL LOW (ref 8.9–10.3)
Chloride: 103 mmol/L (ref 98–111)
Creatinine, Ser: 1.63 mg/dL — ABNORMAL HIGH (ref 0.44–1.00)
GFR, Estimated: 36 mL/min — ABNORMAL LOW (ref >60)
Glucose, Bld: 124 mg/dL — ABNORMAL HIGH (ref 70–99)
Potassium: 4.4 mmol/L (ref 3.5–5.1)
Sodium: 132 mmol/L — ABNORMAL LOW (ref 135–145)

## 2023-09-30 MED ORDER — OXYCODONE HCL 5 MG PO TABS
5.0000 mg | ORAL_TABLET | Freq: Three times a day (TID) | ORAL | Status: DC | PRN
  Administered 2023-09-30 – 2023-10-01 (×3): 5 mg via ORAL
  Filled 2023-09-30 (×3): qty 1

## 2023-09-30 MED ORDER — OXYCODONE-ACETAMINOPHEN 5-325 MG PO TABS
1.0000 | ORAL_TABLET | Freq: Three times a day (TID) | ORAL | Status: DC | PRN
  Administered 2023-09-30 – 2023-10-01 (×3): 1 via ORAL
  Filled 2023-09-30 (×3): qty 1

## 2023-09-30 MED ORDER — OXYCODONE-ACETAMINOPHEN 10-325 MG PO TABS: 1.0000 | ORAL_TABLET | Freq: Three times a day (TID) | ORAL | Status: DC | PRN

## 2023-09-30 MED ORDER — SODIUM CHLORIDE 0.9 % IV SOLN: INTRAVENOUS | Status: AC

## 2023-09-30 NOTE — Plan of Care (Signed)
  Problem: Education: Goal: Knowledge of General Education information will improve Description: Including pain rating scale, medication(s)/side effects and non-pharmacologic comfort measures Outcome: Adequate for Discharge   Problem: Health Behavior/Discharge Planning: Goal: Ability to manage health-related needs will improve Outcome: Adequate for Discharge   Problem: Clinical Measurements: Goal: Ability to maintain clinical measurements within normal limits will improve Outcome: Progressing Goal: Will remain free from infection Outcome: Progressing Goal: Diagnostic test results will improve Outcome: Progressing Goal: Respiratory complications will improve Outcome: Progressing Goal: Cardiovascular complication will be avoided Outcome: Progressing   Problem: Activity: Goal: Risk for activity intolerance will decrease Outcome: Progressing   Problem: Nutrition: Goal: Adequate nutrition will be maintained Outcome: Adequate for Discharge   Problem: Coping: Goal: Level of anxiety will decrease Outcome: Progressing   Problem: Elimination: Goal: Will not experience complications related to bowel motility Outcome: Progressing Goal: Will not experience complications related to urinary retention Outcome: Progressing   Problem: Pain Managment: Goal: General experience of comfort will improve and/or be controlled Outcome: Adequate for Discharge   Problem: Safety: Goal: Ability to remain free from injury will improve Outcome: Progressing   Problem: Skin Integrity: Goal: Risk for impaired skin integrity will decrease Outcome: Progressing

## 2023-09-30 NOTE — Plan of Care (Signed)
  Problem: Education: Goal: Knowledge of General Education information will improve Description: Including pain rating scale, medication(s)/side effects and non-pharmacologic comfort measures 09/30/2023 2316 by Katrina [name] SAILOR, RN Outcome: Adequate for Discharge 09/30/2023 2040 by Katrina [name] SAILOR, RN Outcome: Adequate for Discharge   Problem: Health Behavior/Discharge Planning: Goal: Ability to manage health-related needs will improve 09/30/2023 2316 by Katrina [name] SAILOR, RN Outcome: Progressing 09/30/2023 2040 by Katrina [name] SAILOR, RN Outcome: Adequate for Discharge   Problem: Clinical Measurements: Goal: Ability to maintain clinical measurements within normal limits will improve 09/30/2023 2316 by Katrina [name] SAILOR, RN Outcome: Progressing 09/30/2023 2040 by Katrina [name] SAILOR, RN Outcome: Progressing Goal: Will remain free from infection 09/30/2023 2316 by Katrina [name] SAILOR, RN Outcome: Progressing 09/30/2023 2040 by Katrina [name] SAILOR, RN Outcome: Progressing Goal: Diagnostic test results will improve 09/30/2023 2316 by Katrina [name] SAILOR, RN Outcome: Progressing 09/30/2023 2040 by Katrina [name] SAILOR, RN Outcome: Progressing Goal: Respiratory complications will improve 09/30/2023 2316 by Katrina [name] SAILOR, RN Outcome: Progressing 09/30/2023 2040 by Katrina [name] SAILOR, RN Outcome: Progressing Goal: Cardiovascular complication will be avoided 09/30/2023 2316 by Katrina [name] SAILOR, RN Outcome: Progressing 09/30/2023 2040 by Katrina [name] SAILOR, RN Outcome: Progressing   Problem: Activity: Goal: Risk for activity intolerance will decrease 09/30/2023 2316 by Katrina [name] SAILOR, RN Outcome: Progressing 09/30/2023 2040 by Katrina [name] SAILOR, RN Outcome: Progressing   Problem: Nutrition: Goal: Adequate nutrition will be maintained 09/30/2023 2316 by Katrina [name] SAILOR, RN Outcome: Adequate for Discharge 09/30/2023 2040 by Katrina [name] SAILOR, RN Outcome: Adequate for Discharge   Problem: Coping: Goal: Level of anxiety will decrease 09/30/2023 2316 by Katrina [name] SAILOR, RN Outcome: Progressing 09/30/2023 2040 by Katrina [name] SAILOR, RN Outcome: Progressing   Problem: Elimination: Goal: Will not experience complications related to bowel motility 09/30/2023 2316 by Katrina [name] SAILOR, RN Outcome: Progressing 09/30/2023 2040 by Katrina [name] SAILOR, RN Outcome: Progressing Goal: Will not experience complications related to urinary retention 09/30/2023 2316 by Katrina [name] SAILOR, RN Outcome: Progressing 09/30/2023 2040 by Katrina [name] SAILOR, RN Outcome: Progressing   Problem: Pain Managment: Goal: General experience of comfort will improve and/or be controlled 09/30/2023 2316 by Katrina [name] SAILOR, RN Outcome: Progressing 09/30/2023 2040 by Katrina [name] SAILOR, RN Outcome: Adequate for Discharge   Problem: Safety: Goal: Ability to remain free from injury will improve 09/30/2023 2316 by Katrina [name] SAILOR, RN Outcome: Progressing 09/30/2023 2040 by Katrina [name] SAILOR, RN Outcome: Progressing   Problem: Skin Integrity: Goal: Risk for impaired skin integrity will decrease 09/30/2023 2316 by Katrina [name] SAILOR, RN Outcome: Progressing 09/30/2023 2040 by Katrina [name] SAILOR, RN Outcome: Progressing

## 2023-09-30 NOTE — Progress Notes (Signed)
 Urology Inpatient Progress Note  Subjective: She reports continued pain in her left flank.   Foley draining cloudy yellow urine. On Rocephin .  Anti-infectives: Anti-infectives (From admission, onward)    Start     Dose/Rate Route Frequency Ordered Stop   09/29/23 1500  cefTRIAXone  (ROCEPHIN ) 2 g in sodium chloride  0.9 % 100 mL IVPB        2 g 200 mL/hr over 30 Minutes Intravenous Every 24 hours 09/29/23 0100     09/29/23 1445  cefTRIAXone  (ROCEPHIN ) 1 g in sodium chloride  0.9 % 100 mL IVPB  Status:  Discontinued        1 g 200 mL/hr over 30 Minutes Intravenous Every 24 hours 09/29/23 0100 09/29/23 0100   09/28/23 1445  cefTRIAXone  (ROCEPHIN ) 2 g in sodium chloride  0.9 % 100 mL IVPB        2 g 200 mL/hr over 30 Minutes Intravenous  Once 09/28/23 1441 09/28/23 1553       Current Facility-Administered Medications  Medication Dose Route Frequency Provider Last Rate Last Admin   0.9 %  sodium chloride  infusion   Intravenous Continuous Vernon Ranks, MD 125 mL/hr at 09/30/23 1435 New Bag at 09/30/23 1435   acetaminophen  (TYLENOL ) tablet 650 mg  650 mg Oral Q6H PRN Artrell Lawless J., MD   650 mg at 09/28/23 2018   Or   acetaminophen  (TYLENOL ) suppository 650 mg  650 mg Rectal Q6H PRN Roseann Adine PARAS., MD       amLODipine  (NORVASC ) tablet 10 mg  10 mg Oral Daily Trestan Vahle J., MD   10 mg at 09/30/23 0930   bisacodyl  (DULCOLAX) EC tablet 5 mg  5 mg Oral Daily PRN Roseann Adine PARAS., MD       busPIRone  (BUSPAR ) tablet 7.5 mg  7.5 mg Oral BID Roseann Adine PARAS., MD   7.5 mg at 09/30/23 0930   cefTRIAXone  (ROCEPHIN ) 2 g in sodium chloride  0.9 % 100 mL IVPB  2 g Intravenous Q24H Roseann Adine PARAS., MD 200 mL/hr at 09/30/23 1438 2 g at 09/30/23 1438   Chlorhexidine  Gluconate Cloth 2 % PADS 6 each  6 each Topical Daily Roseann Adine PARAS., MD   6 each at 09/30/23 0932   DULoxetine  (CYMBALTA ) DR capsule 60 mg  60 mg Oral Daily Esra Frankowski J., MD   60 mg at  09/30/23 0930   enoxaparin  (LOVENOX ) injection 40 mg  40 mg Subcutaneous Q24H Roseann Adine PARAS., MD   40 mg at 09/29/23 2147   famotidine  (PEPCID ) tablet 20 mg  20 mg Oral QHS Roseann Adine PARAS., MD   20 mg at 09/29/23 2146   gabapentin  (NEURONTIN ) capsule 300 mg  300 mg Oral TID Roseann Adine PARAS., MD   300 mg at 09/30/23 0930   metoprolol  succinate (TOPROL -XL) 24 hr tablet 100 mg  100 mg Oral Daily Roseann Adine PARAS., MD   100 mg at 09/30/23 0930   multivitamin with minerals tablet 1 tablet  1 tablet Oral Daily Roseann Adine PARAS., MD   1 tablet at 09/30/23 0930   ondansetron  (ZOFRAN ) tablet 4 mg  4 mg Oral Q6H PRN Roseann Adine PARAS., MD       Or   ondansetron  (ZOFRAN ) injection 4 mg  4 mg Intravenous Q6H PRN Roseann Adine PARAS., MD   4 mg at 09/29/23 0941   oxybutynin  (DITROPAN ) tablet 5 mg  5 mg Oral Q8H PRN Tyanne Derocher, Adine PARAS., MD       oxyCODONE -acetaminophen  (PERCOCET/ROXICET) 5-325 MG  per tablet 1 tablet  1 tablet Oral Q8H PRN Pahwani, Ravi, MD       And   oxyCODONE  (Oxy IR/ROXICODONE ) immediate release tablet 5 mg  5 mg Oral Q8H PRN Vernon Ranks, MD       pantoprazole  (PROTONIX ) EC tablet 40 mg  40 mg Oral Daily Chrystina Naff, Adine PARAS., MD   40 mg at 09/30/23 0930   senna-docusate (Senokot-S) tablet 1 tablet  1 tablet Oral QHS PRN Roseann Adine PARAS., MD         Objective: Vital signs in last 24 hours: Temp:  [97.8 F (36.6 C)-98 F (36.7 C)] 97.8 F (36.6 C) (07/30 0623) Pulse Rate:  [63-67] 63 (07/30 0623) Resp:  [15-16] 16 (07/30 0623) BP: (93-106)/(70-74) 106/70 (07/30 0623) SpO2:  [98 %] 98 % (07/30 0623) FiO2 (%):  [21 %] 21 % (07/29 2214)  Intake/Output from previous day: 07/29 0701 - 07/30 0700 In: 2556.6 [P.O.:1280; I.V.:1176.7; IV Piggyback:99.9] Out: 850 [Urine:850] Intake/Output this shift: Total I/O In: 1470 [P.O.:720; I.V.:750] Out: 300 [Urine:300]  GENERAL APPEARANCE:  Well appearing, well developed, well nourished, NAD ABDOMEN:   Soft, non-tender, no masses EXTREMITIES:  Moves all extremities well, without clubbing, cyanosis, or edema NEUROLOGIC:  Alert and oriented x 3, CN II-XII grossly intact MENTAL STATUS:  appropriate SKIN:  Warm, dry, and intact GU;  foley in place   Lab Results:  Recent Labs    09/29/23 0551 09/30/23 0357  WBC 16.2* 16.5*  HGB 9.9* 9.7*  HCT 30.2* 30.8*  PLT 247 289   BMET Recent Labs    09/29/23 0551 09/30/23 0357  NA 132* 132*  K 4.1 4.4  CL 102 103  CO2 22 20*  GLUCOSE 124* 124*  BUN 21* 27*  CREATININE 1.62* 1.63*  CALCIUM 7.7* 7.8*   PT/INR No results for input(s): LABPROT, INR in the last 72 hours. ABG No results for input(s): PHART, HCO3 in the last 72 hours.  Invalid input(s): PCO2, PO2  Studies/Results: DG C-Arm 1-60 Min-No Report Result Date: 09/29/2023 Fluoroscopy was utilized by the requesting physician.  No radiographic interpretation.   CT Renal Stone Study Result Date: 09/28/2023 CLINICAL DATA:  Abdominal/flank pain EXAM: CT ABDOMEN AND PELVIS WITHOUT CONTRAST TECHNIQUE: Multidetector CT imaging of the abdomen and pelvis was performed following the standard protocol without IV contrast. RADIATION DOSE REDUCTION: This exam was performed according to the departmental dose-optimization program which includes automated exposure control, adjustment of the mA and/or kV according to patient size and/or use of iterative reconstruction technique. COMPARISON:  Ultrasound 04/26/2021 FINDINGS: Lower chest: Peripheral airspace opacities in both lower lobes appear to have associated volume loss and accordingly favor atelectasis over pneumonia. Low-density blood pool favors anemia. Small type 1 hiatal hernia. Hepatobiliary: Unremarkable Pancreas: Unremarkable Spleen: Unremarkable Adrenals/Urinary Tract: Adrenal glands unremarkable. Left hydronephrosis with complex high density material in the left collecting system along with abnormal gas in the left collecting  system. Large simple right renal cysts and various left renal cysts which measure less than 20 Hounsfield units but with some cyst demonstrating complexity such as wall calcification or thick margins. Left renal calculi including some speckled calcific densities along the collecting system. Left perinephric stranding and mild proximal periureteral stranding. Stomach/Bowel: Sleeve partial gastrectomy. Vascular/Lymphatic: Mild abdominal aortic atherosclerosis. Enlarged periaortic lymph nodes at the level of the left kidney, including a 1.6 cm node on image 32 series 2. Reproductive: Uterus absent. Other: No supplemental non-categorized findings. Musculoskeletal: Lower lumbar degenerative facet arthropathy. Supraumbilical hernia contains  adipose tissue as shown on image 107 series 6. Moderate to prominent degenerative hip arthropathy bilaterally. IMPRESSION: 1. Left hydronephrosis with complex high density material in the left collecting system along with abnormal gas in the left collecting system. If the patient has not had recent urologic instrumentation of the left kidney and/or lithotripsy, then the appearance is suspicious for pyonephrosis, obstruction, and possibly pyelonephritis/renal abscess. 2. Left renal calculi including some speckled calcific densities along the collecting system. 3. Enlarged periaortic lymph nodes at the level of the left kidney, including a 1.6 cm node. Likely reactive, less likely due to malignancy. Follow up to ensure clearance recommended. 4. Low-density blood pool favors anemia. 5. Peripheral airspace opacities in both lower lobes appear to have associated volume loss and accordingly favor atelectasis over pneumonia. 6. Small type 1 hiatal hernia. 7. Supraumbilical hernia contains adipose tissue. 8. Moderate to prominent degenerative hip arthropathy bilaterally. 9. Lower lumbar degenerative facet arthropathy. 10. Aortic Atherosclerosis (ICD10-I70.0) and Emphysema (ICD10-J43.9).  Electronically Signed   By: Ryan Salvage M.D.   On: 09/28/2023 17:27     Assessment & Plan: Left pyonephrosis/hydronephrosis - POD #1 s/p left ureteral stent insertion - Continue stent and foley to promote drainage  UTI - cx with >100K E. Coli - on Rocephin   AKI - renal function improved from admission   Adine Manly, MD 09/30/2023

## 2023-09-30 NOTE — Progress Notes (Signed)
 PROGRESS NOTE    Hailey Vance  FMW:[id number] DOB: Dec 05, 1965 DOA: 09/28/2023 PCP: Jerome Heron Ruth, PA-C   Brief Narrative:  Hailey Vance is a 58 y.o. female with medical history significant for HTN, osteoarthritis, OSA, anxiety and depression, kidney stones, GERD, neuropathy, morbid obesity s/p gastric bypass and chronic back pain who presented to the ED for evaluation of left flank pain.   Imaging in the ED confirmed pyelonephritis/renal abscess, urology performed cystoscopy with stent placement overnight on the 28th/29th with marked improvement in symptoms.   Assessment & Plan:   Principal Problem:   Acute pyelonephritis Active Problems:   Obesity, Class III, BMI 40-49.9 (morbid obesity)   Hydronephrosis of left kidney   Acute pyonephrosis   Urinary tract infection without hematuria   AKI (acute kidney injury) (HCC)  Severe Sepsis secondary to acute pyelonephritis/pyelonephrosis/profound left hydronephrosis, POA: Patient met severe sepsis criteria due to tachypnea, leukocytosis with notable source and AKI.  Urology consulted, status post cystoscopy 09/29/2023 with noted frank purulent drainage of UO - Insertion of left ureteral stent (51F x 26 cm).  Still with flank pain 7 out of 10.  She is afebrile but has stable leukocytosis.  Urine culture growing E. coli which is sensitive to cephalosporins, patient remains on Rocephin , blood culture negative.   AKI, secondary to above - Creatinine elevated to 2.04, from normal baseline, which has improved to 1.6 but did not improve in last 24 hours, will resume fluids, normal saline at 125 cc/h for next 20 hours and repeat labs in the morning.   HTN - Blood pressure on the low side, hold lisinopril  in setting of AKI, however continue amlodipine  and Toprol  XL.   Anxiety and depression - Continue BuSpar  and duloxetine   OSA - CPAP at bedtime  GERD - Continue famotidine  and Protonix    Class III obesity Body mass index is 46.23  kg/m.  Weight loss and diet modification counseled  DVT prophylaxis: enoxaparin  (LOVENOX ) injection 40 mg Start: 09/28/23 2200   Code Status: Full Code  Family Communication: Daughter present at bedside.  Plan of care discussed with patient in length and he/she verbalized understanding and agreed with it.  Status is: Inpatient Remains inpatient appropriate because: Still with pain, consulted PT OT   Estimated body mass index is 46.23 kg/m as calculated from the following:   Height as of this encounter: 5' 3 (1.6 m).   Weight as of this encounter: 118.4 kg.    Nutritional Assessment: Body mass index is 46.23 kg/m.Hailey Vance Seen by dietician.  I agree with the assessment and plan as outlined below: Nutrition Status:        . Skin Assessment: I have examined the patient's skin and I agree with the wound assessment as performed by the wound care RN as outlined below:    Consultants:  Urology  Procedures:  As above  Antimicrobials:  Anti-infectives (From admission, onward)    Start     Dose/Rate Route Frequency Ordered Stop   09/29/23 1500  cefTRIAXone  (ROCEPHIN ) 2 g in sodium chloride  0.9 % 100 mL IVPB        2 g 200 mL/hr over 30 Minutes Intravenous Every 24 hours 09/29/23 0100     09/29/23 1445  cefTRIAXone  (ROCEPHIN ) 1 g in sodium chloride  0.9 % 100 mL IVPB  Status:  Discontinued        1 g 200 mL/hr over 30 Minutes Intravenous Every 24 hours 09/29/23 0100 09/29/23 0100   09/28/23 1445  cefTRIAXone  (ROCEPHIN )  2 g in sodium chloride  0.9 % 100 mL IVPB        2 g 200 mL/hr over 30 Minutes Intravenous  Once 09/28/23 1441 09/28/23 1553         Subjective: Seen and examined, nurse and daughter at the bedside.  Patient still complains of the left flank pain 7 out of 10.  No other complaint.  Objective: Vitals:   09/29/23 0953 09/29/23 1349 09/29/23 2154 09/30/23 0623  BP: 113/78 99/72 93/74  106/70  Pulse: 81 81 67 63  Resp: 16 17 15 16   Temp: 98.4 F (36.9 C) 98.6 F  (37 C) 98 F (36.7 C) 97.8 F (36.6 C)  TempSrc: Oral Oral Oral Oral  SpO2: 95% 94% 98% 98%  Weight:      Height:        Intake/Output Summary (Last 24 hours) at 09/30/2023 1113 Last data filed at 09/30/2023 0620 Gross per 24 hour  Intake 2316.58 ml  Output 850 ml  Net 1466.58 ml   Filed Weights   09/28/23 2033  Weight: 118.4 kg    Examination:  General exam: Appears calm and comfortable, obese Respiratory system: Clear to auscultation. Respiratory effort normal. Cardiovascular system: S1 & S2 heard, RRR. No JVD, murmurs, rubs, gallops or clicks. No pedal edema. Gastrointestinal system: Abdomen is nondistended, soft and nontender. No organomegaly or masses felt. Normal bowel sounds heard.  Left CVA tenderness Central nervous system: Alert and oriented. No focal neurological deficits. Extremities: Symmetric 5 x 5 power. Skin: No rashes, lesions or ulcers Psychiatry: Judgement and insight appear normal. Mood & affect appropriate.    Data Reviewed: I have personally reviewed following labs and imaging studies  CBC: Recent Labs  Lab 09/28/23 1413 09/29/23 0551 09/30/23 0357  WBC 17.2* 16.2* 16.5*  NEUTROABS 14.4*  --   --   HGB 11.1* 9.9* 9.7*  HCT 33.7* 30.2* 30.8*  MCV 102.1* 101.3* 101.3*  PLT 251 247 289   Basic Metabolic Panel: Recent Labs  Lab 09/28/23 1413 09/29/23 0551 09/30/23 0357  NA 132* 132* 132*  K 3.4* 4.1 4.4  CL 97* 102 103  CO2 24 22 20*  GLUCOSE 158* 124* 124*  BUN 19 21* 27*  CREATININE 2.04* 1.62* 1.63*  CALCIUM 8.2* 7.7* 7.8*   GFR: Estimated Creatinine Clearance: 46.2 mL/min (A) (by C-G formula based on SCr of 1.63 mg/dL (H)). Liver Function Tests: Recent Labs  Lab 09/28/23 1414  AST 32  ALT 22  ALKPHOS 93  BILITOT 1.7*  PROT 6.7  ALBUMIN  2.7*   No results for input(s): LIPASE, AMYLASE in the last 168 hours. No results for input(s): AMMONIA in the last 168 hours. Coagulation Profile: No results for input(s): INR,  PROTIME in the last 168 hours. Cardiac Enzymes: No results for input(s): CKTOTAL, CKMB, CKMBINDEX, TROPONINI in the last 168 hours. BNP (last 3 results) No results for input(s): PROBNP in the last 8760 hours. HbA1C: No results for input(s): HGBA1C in the last 72 hours. CBG: No results for input(s): GLUCAP in the last 168 hours. Lipid Profile: No results for input(s): CHOL, HDL, LDLCALC, TRIG, CHOLHDL, LDLDIRECT in the last 72 hours. Thyroid  Function Tests: No results for input(s): TSH, T4TOTAL, FREET4, T3FREE, THYROIDAB in the last 72 hours. Anemia Panel: No results for input(s): VITAMINB12, FOLATE, FERRITIN, TIBC, IRON, RETICCTPCT in the last 72 hours. Sepsis Labs: Recent Labs  Lab 09/28/23 1448 09/28/23 1719  LATICACIDVEN 1.8 1.3    Recent Results (from the past 240 hours)  Urine Culture     Status: Abnormal   Collection Time: 09/28/23  2:22 PM   Specimen: In/Out Cath Urine  Result Value Ref Range Status   Specimen Description   Final    IN/OUT CATH URINE Performed at [facility], [address], [place], [place] [address]    Special Requests   Final    Normal Performed at [facility], [address] [place], [place] [address]    Culture   Final    Two isolates with different morphologies were identified as the same organism.The most resistant organism was reported. >=100,000 COLONIES/mL ESCHERICHIA COLI    Report Status 09/30/2023 FINAL  Final   Organism ID, Bacteria ESCHERICHIA COLI (A)  Final      Susceptibility   Escherichia coli - MIC*    AMPICILLIN >=32 RESISTANT Resistant     CEFAZOLIN <=4 SENSITIVE Sensitive     CEFEPIME <=0.12 SENSITIVE Sensitive     CEFTRIAXONE  <=0.25 SENSITIVE Sensitive     CIPROFLOXACIN  <=0.25 SENSITIVE Sensitive     GENTAMICIN <=1 SENSITIVE Sensitive     IMIPENEM <=0.25 SENSITIVE Sensitive     NITROFURANTOIN  32 SENSITIVE Sensitive      TRIMETH /SULFA  <=20 SENSITIVE Sensitive     AMPICILLIN/SULBACTAM >=32 RESISTANT Resistant     PIP/TAZO <=4 SENSITIVE Sensitive ug/mL    * >=100,000 COLONIES/mL ESCHERICHIA COLI  Blood culture (routine x 2)     Status: None (Preliminary result)   Collection Time: 09/28/23  2:47 PM   Specimen: BLOOD  Result Value Ref Range Status   Specimen Description   Final    BLOOD RIGHT ANTECUBITAL Performed at [facility], [address], [place], [place] [address]    Special Requests   Final    BOTTLES DRAWN AEROBIC AND ANAEROBIC Blood Culture adequate volume Performed at [facility], [address], [place], [place] [address]    Culture   Final    NO GROWTH < 24 HOURS Performed at [facility], [address], [place], [place] [address]    Report Status PENDING  Incomplete  Blood culture (routine x 2)     Status: None (Preliminary result)   Collection Time: 09/28/23  3:54 PM   Specimen: BLOOD  Result Value Ref Range Status   Specimen Description   Final    BLOOD LEFT ANTECUBITAL Performed at [facility], [address], [place], [place] [address]    Special Requests   Final    BOTTLES DRAWN AEROBIC AND ANAEROBIC Blood Culture adequate volume Performed at [facility], [address], [place], [place] [address]    Culture   Final    NO GROWTH < 24 HOURS Performed at [facility], [address], [place], [place] [address]    Report Status PENDING  Incomplete  Surgical pcr screen     Status: None   Collection Time: 09/29/23  2:56 AM   Specimen: Nasal Mucosa; Nasal Swab  Result Value Ref Range Status   MRSA, PCR NEGATIVE NEGATIVE Final   Staphylococcus aureus NEGATIVE NEGATIVE Final    Comment: (NOTE) The Xpert SA Assay (FDA approved for NASAL specimens in patients 39 years of age and older), is one component of a comprehensive surveillance program. It is not intended to diagnose infection nor  to guide or monitor treatment. Performed at [facility], [address] [place], [place] [address]      Radiology Studies:  DG C-Arm 1-60 Min-No Report Result Date: 09/29/2023 Fluoroscopy was utilized by the requesting physician.  No radiographic interpretation.   CT Renal Stone Study Result Date: 09/28/2023 CLINICAL DATA:  Abdominal/flank pain EXAM: CT ABDOMEN AND PELVIS WITHOUT CONTRAST TECHNIQUE: Multidetector CT imaging of the abdomen and pelvis was performed following the standard protocol without IV contrast. RADIATION DOSE REDUCTION: This exam was performed according to the departmental dose-optimization program which includes automated exposure control, adjustment of the mA and/or kV according to patient size and/or use of iterative reconstruction technique. COMPARISON:  Ultrasound 04/26/2021 FINDINGS: Lower chest: Peripheral airspace opacities in both lower lobes appear to have associated volume loss and accordingly favor atelectasis over pneumonia. Low-density blood pool favors anemia. Small type 1 hiatal hernia. Hepatobiliary: Unremarkable Pancreas: Unremarkable Spleen: Unremarkable Adrenals/Urinary Tract: Adrenal glands unremarkable. Left hydronephrosis with complex high density material in the left collecting system along with abnormal gas in the left collecting system. Large simple right renal cysts and various left renal cysts which measure less than 20 Hounsfield units but with some cyst demonstrating complexity such as wall calcification or thick margins. Left renal calculi including some speckled calcific densities along the collecting system. Left perinephric stranding and mild proximal periureteral stranding. Stomach/Bowel: Sleeve partial gastrectomy. Vascular/Lymphatic: Mild abdominal aortic atherosclerosis. Enlarged periaortic lymph nodes at the level of the left kidney, including a 1.6 cm node on image 32 series 2. Reproductive: Uterus absent. Other: No  supplemental non-categorized findings. Musculoskeletal: Lower lumbar degenerative facet arthropathy. Supraumbilical hernia contains adipose tissue as shown on image 107 series 6. Moderate to prominent degenerative hip arthropathy bilaterally. IMPRESSION: 1. Left hydronephrosis with complex high density material in the left collecting system along with abnormal gas in the left collecting system. If the patient has not had recent urologic instrumentation of the left kidney and/or lithotripsy, then the appearance is suspicious for pyonephrosis, obstruction, and possibly pyelonephritis/renal abscess. 2. Left renal calculi including some speckled calcific densities along the collecting system. 3. Enlarged periaortic lymph nodes at the level of the left kidney, including a 1.6 cm node. Likely reactive, less likely due to malignancy. Follow up to ensure clearance recommended. 4. Low-density blood pool favors anemia. 5. Peripheral airspace opacities in both lower lobes appear to have associated volume loss and accordingly favor atelectasis over pneumonia. 6. Small type 1 hiatal hernia. 7. Supraumbilical hernia contains adipose tissue. 8. Moderate to prominent degenerative hip arthropathy bilaterally. 9. Lower lumbar degenerative facet arthropathy. 10. Aortic Atherosclerosis (ICD10-I70.0) and Emphysema (ICD10-J43.9). Electronically Signed   By: Ryan Salvage M.D.   On: 09/28/2023 17:27   DG Chest Portable 1 View Result Date: 09/28/2023 CLINICAL DATA:  Short of breath EXAM: PORTABLE CHEST 1 VIEW COMPARISON:  None Available. FINDINGS: Normal mediastinum and cardiac silhouette. Normal pulmonary vasculature. No evidence of effusion, infiltrate, or pneumothorax. No acute bony abnormality. IMPRESSION: No acute cardiopulmonary process. Electronically Signed   By: Jackquline Boxer M.D.   On: 09/28/2023 15:17    Scheduled Meds:  amLODipine   10 mg Oral Daily   busPIRone   7.5 mg Oral BID   Chlorhexidine  Gluconate Cloth  6  each Topical Daily   DULoxetine   60 mg Oral Daily   enoxaparin  (LOVENOX ) injection  40 mg Subcutaneous Q24H   famotidine   20 mg Oral QHS   gabapentin   300 mg Oral TID   metoprolol  succinate  100 mg Oral Daily   multivitamin with minerals  1 tablet Oral Daily   pantoprazole   40 mg Oral Daily   Continuous Infusions:  sodium chloride      cefTRIAXone  (ROCEPHIN )  IV 2 g (09/29/23 1542)     LOS: 2 days   Fredia Skeeter, MD [facility]  09/30/2023, 11:13 AM   *Please note that this is a verbal dictation therefore any spelling or grammatical errors are due to the Dragon Medical One system interpretation.  Please page via Amion and do not message via secure chat for urgent patient care matters. Secure chat can be used for non urgent patient care matters.  How to contact the [facility] Attending or Consulting provider 7A - 7P or covering provider during after hours 7P -7A, for this patient?  Check the care team in [facility] and look for a) attending/consulting [facility] provider listed and b) the [facility] team listed. Page or secure chat 7A-7P. Log into [email or web address] and use [place]'s universal password to access. If you do not have the password, please contact the hospital operator. Locate the [facility] provider you are looking for under [facility] and page to a number that you can be directly reached. If you still have difficulty reaching the provider, please page the [facility] (Director on Call) for the Hospitalists listed on amion for assistance.

## 2023-09-30 NOTE — Progress Notes (Signed)
 OT Cancellation Note  Patient Details Name: Hailey Vance MRN: [id number] DOB: 1966/02/07   Cancelled Treatment:    Reason Eval/Treat Not Completed: Pain limiting ability to participate. Spoke with Physical therapy who just left pt's room. Pt refusing therapy for now until her pain is managed.PT sent secure chats to physician and RN.  OT will attempt tomorrow.   Delon Falter 09/30/2023, 1:47 PM

## 2023-09-30 NOTE — Plan of Care (Signed)
   Problem: Clinical Measurements: Goal: Cardiovascular complication will be avoided Outcome: Progressing

## 2023-09-30 NOTE — Progress Notes (Signed)
 PT Cancellation Note  Patient Details Name: Hailey Vance MRN: [id number] DOB: 1965-04-12   Cancelled Treatment:    Reason Eval/Treat Not Completed: Pain limiting ability to participate (pt reports she's not able to tolerate activity 2*  7/10 back and flank pain, she stated she takes oxycodone  at baseline (this is managed at [facility] pain clinic), but doesn't have oxycodone  ordered here. Pt requesting oxycodone . RN and MD notified.) Will follow.   Sylvan Nest Kistler PT 09/30/2023  Acute Rehabilitation Services  Office [phone]

## 2023-09-30 NOTE — Progress Notes (Signed)
   09/30/23 2314  BiPAP/CPAP/SIPAP  BiPAP/CPAP/SIPAP Pt Type Adult  BiPAP/CPAP/SIPAP Resmed  Mask Type Full face mask  Dentures removed? Not applicable  Mask Size Medium  FiO2 (%) 21 %  Patient Home Machine No  Patient Home Mask No  Patient Home Tubing No  Auto Titrate Yes  Minimum cmH2O 4 cmH2O  Maximum cmH2O 20 cmH2O  Device Plugged into RED Power Outlet Yes

## 2023-09-30 NOTE — Anesthesia Postprocedure Evaluation (Signed)
 Anesthesia Post Note  Patient: Hailey Vance  Procedure(s) Performed: CYSTOSCOPY WITH RETROGRADE PYELOGRAM AND STENT INSERTION (Left: Ureter)     Patient location during evaluation: PACU Anesthesia Type: General Level of consciousness: awake and alert Pain management: pain level controlled Vital Signs Assessment: post-procedure vital signs reviewed and stable Respiratory status: spontaneous breathing, nonlabored ventilation, respiratory function stable and patient connected to nasal cannula oxygen Cardiovascular status: blood pressure returned to baseline and stable Postop Assessment: no apparent nausea or vomiting Anesthetic complications: no   No notable events documented.  Last Vitals:  Vitals:   09/29/23 2154 09/30/23 0623  BP: 93/74 106/70  Pulse: 67 63  Resp: 15 16  Temp: 36.7 C 36.6 C  SpO2: 98% 98%    Last Pain:  Vitals:   09/30/23 0800  TempSrc:   PainSc: Asleep                 Awad Gladd S

## 2023-10-01 ENCOUNTER — Other Ambulatory Visit (HOSPITAL_COMMUNITY): Payer: Self-pay

## 2023-10-01 ENCOUNTER — Telehealth: Payer: Self-pay | Admitting: Urology

## 2023-10-01 DIAGNOSIS — N10 Acute tubulo-interstitial nephritis: Secondary | ICD-10-CM | POA: Diagnosis not present

## 2023-10-01 LAB — CBC
HCT: 31.1 % — ABNORMAL LOW (ref 36.0–46.0)
Hemoglobin: 10 g/dL — ABNORMAL LOW (ref 12.0–15.0)
MCH: 33.1 pg (ref 26.0–34.0)
MCHC: 32.2 g/dL (ref 30.0–36.0)
MCV: 103 fL — ABNORMAL HIGH (ref 80.0–100.0)
Platelets: 368 K/uL (ref 150–400)
RBC: 3.02 MIL/uL — ABNORMAL LOW (ref 3.87–5.11)
RDW: 14 % (ref 11.5–15.5)
WBC: 14.3 K/uL — ABNORMAL HIGH (ref 4.0–10.5)
nRBC: 0.1 % (ref 0.0–0.2)

## 2023-10-01 LAB — BASIC METABOLIC PANEL WITH GFR
Anion gap: 8 (ref 5–15)
BUN: 25 mg/dL — ABNORMAL HIGH (ref 6–20)
CO2: 23 mmol/L (ref 22–32)
Calcium: 7.7 mg/dL — ABNORMAL LOW (ref 8.9–10.3)
Chloride: 102 mmol/L (ref 98–111)
Creatinine, Ser: 1.02 mg/dL — ABNORMAL HIGH (ref 0.44–1.00)
GFR, Estimated: >60 mL/min (ref >60)
Glucose, Bld: 117 mg/dL — ABNORMAL HIGH (ref 70–99)
Potassium: 4.1 mmol/L (ref 3.5–5.1)
Sodium: 133 mmol/L — ABNORMAL LOW (ref 135–145)

## 2023-10-01 MED ORDER — METOPROLOL SUCCINATE ER 50 MG PO TB24
50.0000 mg | ORAL_TABLET | Freq: Every day | ORAL | 0 refills | Status: AC
  Filled 2023-10-01: qty 30, 30d supply, fill #0

## 2023-10-01 MED ORDER — AMLODIPINE BESYLATE 5 MG PO TABS
5.0000 mg | ORAL_TABLET | Freq: Every day | ORAL | 0 refills | Status: AC
  Filled 2023-10-01: qty 30, 30d supply, fill #0

## 2023-10-01 MED ORDER — CEFADROXIL 500 MG PO CAPS
1.0000 g | ORAL_CAPSULE | Freq: Two times a day (BID) | ORAL | 0 refills | Status: AC
  Filled 2023-10-01: qty 56, 14d supply, fill #0

## 2023-10-01 MED ORDER — SODIUM CHLORIDE 0.9 % IV BOLUS
1000.0000 mL | Freq: Once | INTRAVENOUS | Status: AC
  Administered 2023-10-01: 1000 mL via INTRAVENOUS

## 2023-10-01 MED ORDER — METOPROLOL SUCCINATE ER 50 MG PO TB24
50.0000 mg | ORAL_TABLET | Freq: Every day | ORAL | Status: DC
  Administered 2023-10-01: 50 mg via ORAL
  Filled 2023-10-01: qty 1

## 2023-10-01 MED ORDER — AMLODIPINE BESYLATE 5 MG PO TABS
5.0000 mg | ORAL_TABLET | Freq: Every day | ORAL | Status: DC
Start: 2023-10-01 — End: 2023-10-01
  Administered 2023-10-01: 5 mg via ORAL
  Filled 2023-10-01: qty 1

## 2023-10-01 NOTE — Evaluation (Signed)
 Physical Therapy Evaluation Patient Details Name: Hailey Vance MRN: [id number] DOB: [birth year] Today's Date: 10/01/2023  History of Present Illness  58 y.o. female who presented to the ED for evaluation of left flank pain. Imaging in the ED confirmed pyelonephritis/renal abscess, urology performed cystoscopy with stent placement overnight on the 28th/29th with marked improvement in symptoms.  Pt with medical history significant for HTN, osteoarthritis, OSA, anxiety and depression, kidney stones, GERD, neuropathy, morbid obesity s/p gastric bypass and chronic back pain  Clinical Impression  Pt admitted with above diagnosis. Pt was able to ambulate with RW with good balance overall needing CGA due to pt unsure how far she can walk currently. Daughter present and can assist pt at home and feels comfortable helping pt at current level. Pt used rollator PTA.  Issued gait belt and recommend HHPT and HHOT f/u.   Pt currently with functional limitations due to the deficits listed below (see PT Problem List). Pt will benefit from acute skilled PT to increase their independence and safety with mobility to allow discharge.           If plan is discharge home, recommend the following: A little help with bathing/dressing/bathroom;A little help with walking and/or transfers;Assistance with cooking/housework;Assist for transportation   Can travel by private vehicle        Equipment Recommendations None recommended by PT  Recommendations for Other Services       Functional Status Assessment Patient has had a recent decline in their functional status and demonstrates the ability to make significant improvements in function in a reasonable and predictable amount of time.     Precautions / Restrictions Precautions Precautions: Fall Restrictions Weight Bearing Restrictions Per Provider Order: No      Mobility  Bed Mobility Overal bed mobility: Independent             General bed mobility  comments: took incr time but to EOB on her own    Transfers Overall transfer level: Needs assistance Equipment used: Rolling walker (2 wheels) Transfers: Sit to/from Stand Sit to Stand: Contact guard assist           General transfer comment: Pt needed cues for hand placement and takes incr time to stand but no physical assist.    Ambulation/Gait Ambulation/Gait assistance: Min assist Gait Distance (Feet): 125 Feet Assistive device: Rolling walker (2 wheels) Gait Pattern/deviations: Step-through pattern, Decreased stride length, Drifts right/left, Trunk flexed, Wide base of support   Gait velocity interpretation: <1.31 ft/sec, indicative of household ambulator   General Gait Details: Pt able to ambulate with RW with CGA and min cues to stand tall as she tends to flex as she fatigues.  Followed pt with chair so she could walk a longer distance.  Daughter present.  Stairs            Wheelchair Mobility     Tilt Bed    Modified Rankin (Stroke Patients Only)       Balance Overall balance assessment: Needs assistance Sitting-balance support: No upper extremity supported, Feet supported Sitting balance-Leahy Scale: Good     Standing balance support: Bilateral upper extremity supported, During functional activity, Reliant on assistive device for balance Standing balance-Leahy Scale: Poor                               Pertinent Vitals/Pain Pain Assessment Pain Assessment: 0-10 Pain Score: 8  Pain Location: left side and back Pain Descriptors /  Indicators: Aching, Discomfort, Grimacing, Guarding Pain Intervention(s): Limited activity within patient's tolerance, Monitored during session, Repositioned    Home Living Family/patient expects to be discharged to:: Private residence Living Arrangements: Children Available Help at Discharge: Family;Available 24 hours/day Type of Home: Apartment Home Access: Level entry       Home Layout: One  level Home Equipment: Rollator (4 wheels);Shower seat      Prior Function Prior Level of Function : Independent/Modified Independent             Mobility Comments: used rollator at home and walked short distances ADLs Comments: I with B/D     Extremity/Trunk Assessment   Upper Extremity Assessment Upper Extremity Assessment: Defer to OT evaluation    Lower Extremity Assessment Lower Extremity Assessment: Generalized weakness    Cervical / Trunk Assessment Cervical / Trunk Assessment: Kyphotic  Communication   Communication Communication: No apparent difficulties    Cognition Arousal: Alert Behavior During Therapy: WFL for tasks assessed/performed   PT - Cognitive impairments: No apparent impairments                         Following commands: Intact       Cueing       General Comments      Exercises     Assessment/Plan    PT Assessment Patient needs continued PT services  PT Problem List Decreased activity tolerance;Decreased balance;Decreased mobility;Decreased knowledge of use of DME;Decreased safety awareness;Decreased knowledge of precautions;Obesity       PT Treatment Interventions DME instruction;Gait training;Functional mobility training;Therapeutic activities;Therapeutic exercise;Balance training;Patient/family education    PT Goals (Current goals can be found in the Care Plan section)  Acute Rehab PT Goals Patient Stated Goal: to go home PT Goal Formulation: With patient Time For Goal Achievement: 10/15/23 Potential to Achieve Goals: Good    Frequency Min 2X/week     Co-evaluation               AM-PAC PT 6 Clicks Mobility  Outcome Measure Help needed turning from your back to your side while in a flat bed without using bedrails?: None Help needed moving from lying on your back to sitting on the side of a flat bed without using bedrails?: None Help needed moving to and from a bed to a chair (including a  wheelchair)?: A Little Help needed standing up from a chair using your arms (e.g., wheelchair or bedside chair)?: A Little Help needed to walk in hospital room?: A Little Help needed climbing 3-5 steps with a railing? : A Lot 6 Click Score: 19    End of Session Equipment Utilized During Treatment: Gait belt Activity Tolerance: Patient tolerated treatment well Patient left: in chair;with call bell/phone within reach;with chair alarm set;with family/visitor present Nurse Communication: Mobility status PT Visit Diagnosis: Muscle weakness (generalized) (M62.81)    Time: 9089-9066 PT Time Calculation (min) (ACUTE ONLY): 23 min   Charges:   PT Evaluation $PT Eval Moderate Complexity: 1 Mod PT Treatments $Gait Training: 8-22 mins PT General Charges $$ ACUTE PT VISIT: 1 Visit         Breezie Micucci M,PT Acute Rehab Services [phone]   Stephane JULIANNA Bevel 10/01/2023, 1:23 PM

## 2023-10-01 NOTE — Telephone Encounter (Signed)
-----   Message from [facility] Crysta A sent at 10/01/2023 11:22 AM EDT ----- Would the 20th at 11:00 AM work? ----- Message ----- From: Trina Flight Sent: 10/01/2023  11:03 AM EDT To: Roselee GORMAN [place], CMA  Where would you like me to schedule her? In the morning early or in an 11am slot if there is one? ----- Message ----- From: Roseann Adine PARAS., MD Sent: 10/01/2023  11:00 AM EDT To: Madelin CHRISTELLA [place] Flight Trina  Please schedule patient for post op appt in 2-3  weeks.  Will need cysto and stent removal. Overbook if needed.

## 2023-10-01 NOTE — Discharge Summary (Signed)
 Physician Discharge Summary  Hailey Vance FMW:[id number] DOB: [birth year] DOA: 09/28/2023  PCP: Jerome Heron Ruth, PA-C  Admit date: 09/28/2023 Discharge date: 10/01/2023 30 Day Unplanned Readmission Risk Score    Flowsheet Row ED to Hosp-Admission (Current) from 09/28/2023 in [facility]  30 Day Unplanned Readmission Risk Score (%) 14.91 Filed at 10/01/2023 1201    This score is the patient's risk of an unplanned readmission within 30 days of being discharged (0 -100%). The score is based on dignosis, age, lab data, medications, orders, and past utilization.   Low:  0-14.9   Medium: 15-21.9   High: 22-29.9   Extreme: 30 and above          Admitted From: Home Disposition: Home  Recommendations for Outpatient Follow-up:  Follow up with PCP in 1-2 weeks Please obtain BMP/CBC in one week Follow-up with urology in 2 weeks for stent removal Please follow up with your PCP on the following pending results: Unresulted Labs (From admission, onward)     Start     Ordered   09/30/23 0500  CBC  Daily,   R      09/29/23 1459   09/30/23 0500  Basic metabolic panel with GFR  Daily,   R      09/29/23 1459              Home Health: Yes Equipment/Devices: None  Discharge Condition: Stable CODE STATUS: Full code Diet recommendation: Cardiac  Subjective: Seen and examined, still with left flank pain, slightly better than yesterday.  Lengthy discussion with patient and her daughter at the bedside.  Other than pain, she appears to be stable.  She also appears to be much more comfortable for the pain level that she is describing.  Provided her with both options to either stay for pain control or go home.  She chose to go home as she thinks that she can handle her pain with the pain medications which she gets from her pain clinics  from [facility].  Brief/Interim Summary: Hailey Vance is a 58 y.o. female with medical history significant for HTN,  osteoarthritis, OSA, anxiety and depression, kidney stones, GERD, neuropathy, morbid obesity s/p gastric bypass and chronic back pain who presented to the ED for evaluation of left flank pain.   Imaging in the ED confirmed pyelonephritis/renal abscess, urology performed cystoscopy with stent placement overnight on the 28th/29th with marked improvement in symptoms.    Severe Sepsis secondary to acute pyelonephritis/pyelonephrosis/profound left hydronephrosis, POA: Patient met severe sepsis criteria due to tachypnea, leukocytosis with notable source and AKI.  Urology consulted, status post cystoscopy 09/29/2023 with noted frank purulent drainage of UO - Insertion of left ureteral stent (32F x 26 cm).  Still with flank pain 6 out of 10 but she is  comfortable going home stating that she can manage this and she has chronic pain syndrome as well.  She is afebrile and leukocytosis improving.  But has stable leukocytosis.  Urine culture growing E. coli which is sensitive to cephalosporins, patient received Rocephin  during inpatient, she has been cleared by urology, she is being discharged on cefadroxil  1 g p.o. twice daily for 2 weeks per urology recommendation and she will follow-up with them for stent removal.   AKI, secondary to above - Creatinine elevated to 2.04, from normal baseline, which has improved to 1.02 after hydration.   HTN - Blood pressure remained on the low side, her lisinopril  was held due to AKI, we ended up  reducing her amlodipine  to half milligram-5 mg and reducing her Toprol -XL to half as well to 50 mg and we are discharging her on reduced doses of both and discontinue lisinopril .   Anxiety and depression - Continue BuSpar  and duloxetine    OSA - CPAP at bedtime   GERD - Continue famotidine  and Protonix    Class III obesity Body mass index is 46.23 kg/m.  Weight loss and diet modification counseled  Discharge plan was discussed with patient and/or family member and they verbalized  understanding and agreed with it.  Discharge Diagnoses:  Principal Problem:   Acute pyelonephritis Active Problems:   Obesity, Class III, BMI 40-49.9 (morbid obesity)   Pyonephrosis   Acute pyonephrosis   Urinary tract infection without hematuria   AKI (acute kidney injury) (HCC)    Discharge Instructions   Allergies as of 10/01/2023       Reactions   Penicillins Itching, Other (See Comments)   Has patient had a PCN reaction causing immediate rash, facial/tongue/throat swelling, SOB or lightheadedness with hypotension: No Has patient had a PCN reaction causing severe rash involving mucus membranes or skin necrosis: No Has patient had a PCN reaction that required hospitalization No Has patient had a PCN reaction occurring within the last 10 years: No If all of the above answers are NO, then may proceed with Cephalosporin use.        Medication List     STOP taking these medications    lisinopril  30 MG tablet Commonly known as: ZESTRIL        TAKE these medications    albuterol  108 (90 Base) MCG/ACT inhaler Commonly known as: VENTOLIN  HFA INHALE 2 PUFFS BY MOUTH EVERY 6 HOURS AS NEEDED FOR WHEEZING FOR SHORTNESS OF BREATH   amLODipine  5 MG tablet Commonly known as: NORVASC  Take 1 tablet (5 mg total) by mouth daily. Start taking on: October 02, 2023 What changed:  medication strength how much to take   B-D DISP NEEDLE 25GX1 25G X 1 Misc Generic drug: NEEDLE (DISP) 25 G For B12 injections   busPIRone  7.5 MG tablet Commonly known as: BUSPAR  Take 1 tablet (7.5 mg total) by mouth 2 (two) times daily.   cefadroxil  1 g tablet Commonly known as: DURICEF Take 1 tablet (1 g total) by mouth 2 (two) times daily for 14 days.   cyanocobalamin  1000 MCG tablet Commonly known as: VITAMIN B12 Take 1 tablet (1,000 mcg total) by mouth daily.   cyanocobalamin  1000 MCG/ML injection Commonly known as: VITAMIN B12 Inject 1 mL (1,000 mcg total) into the muscle every 30  (thirty) days.   diclofenac  Sodium 1 % Gel Commonly known as: Voltaren  Apply 2 g topically daily as needed.   DRY EYES OP Apply to eye as needed.   DULoxetine  60 MG capsule Commonly known as: CYMBALTA  Take 1 capsule (60 mg total) by mouth daily.   famotidine  20 MG tablet Commonly known as: PEPCID  Take 1 tablet (20 mg total) by mouth at bedtime.   gabapentin  300 MG capsule Commonly known as: NEURONTIN  Take 1 capsule (300 mg total) by mouth 3 (three) times daily.   metoprolol  succinate 50 MG 24 hr tablet Commonly known as: TOPROL -XL Take 1 tablet (50 mg total) by mouth daily. Take with or immediately following a meal. Start taking on: October 02, 2023 What changed:  medication strength how much to take   multivitamin-iron-minerals-folic acid chewable tablet Chew 1 tablet by mouth daily.   nystatin  powder Commonly known as: MYCOSTATIN /NYSTOP  Apply 1 Application  topically 2 (two) times daily.   omeprazole  40 MG capsule Commonly known as: PRILOSEC Take 1 capsule (40 mg total) by mouth daily. Daily 30 minutes before breakfast   ondansetron  4 MG tablet Commonly known as: Zofran  Take 1 tablet (4 mg total) by mouth every 8 (eight) hours as needed for nausea or vomiting.   oxyCODONE -acetaminophen  10-325 MG tablet Commonly known as: PERCOCET Take 1 tablet by mouth 3 (three) times daily as needed for pain.   SYRINGE-NEEDLE (DISP) 3 ML 23G X 1 3 ML Misc For B12 injections   tirzepatide  12.5 MG/0.5ML Pen Commonly known as: MOUNJARO  Inject 12.5 mg into the skin once a week.   Vitamin D  (Ergocalciferol ) 1.25 MG (50000 UNIT) Caps capsule Commonly known as: DRISDOL  Take 1 capsule (50,000 Units total) by mouth every 7 (seven) days.   VITAMIN D3 GUMMIES PO Take 2 tablets by mouth daily. Reports 5000 IU takes 2 gummies daily        Follow-up Information     Jerome Heron Ruth, PA-C Follow up in 1 week(s).   Specialty: Physician Assistant Contact information: [address] [place], [place] [address] [phone]         Roseann Adine PARAS., MD Follow up in 2 week(s).   Specialty: Urology Contact information: [address] [place] [address] [phone]                Allergies  Allergen Reactions   Penicillins Itching and Other (See Comments)    Has patient had a PCN reaction causing immediate rash, facial/tongue/throat swelling, SOB or lightheadedness with hypotension: No Has patient had a PCN reaction causing severe rash involving mucus membranes or skin necrosis: No Has patient had a PCN reaction that required hospitalization No Has patient had a PCN reaction occurring within the last 10 years: No If all of the above answers are NO, then may proceed with Cephalosporin use.     Consultations: Urology   Procedures/Studies: DG C-Arm 1-60 Min-No Report Result Date: 09/29/2023 Fluoroscopy was utilized by the requesting physician.  No radiographic interpretation.   CT Renal Stone Study Result Date: 09/28/2023 CLINICAL DATA:  Abdominal/flank pain EXAM: CT ABDOMEN AND PELVIS WITHOUT CONTRAST TECHNIQUE: Multidetector CT imaging of the abdomen and pelvis was performed following the standard protocol without IV contrast. RADIATION DOSE REDUCTION: This exam was performed according to the departmental dose-optimization program which includes automated exposure control, adjustment of the mA and/or kV according to patient size and/or use of iterative reconstruction technique. COMPARISON:  Ultrasound 04/26/2021 FINDINGS: Lower chest: Peripheral airspace opacities in both lower lobes appear to have associated volume loss and accordingly favor atelectasis over pneumonia. Low-density blood pool favors anemia. Small type 1 hiatal hernia. Hepatobiliary: Unremarkable Pancreas: Unremarkable Spleen: Unremarkable Adrenals/Urinary Tract: Adrenal glands unremarkable. Left hydronephrosis with complex high density material in the left  collecting system along with abnormal gas in the left collecting system. Large simple right renal cysts and various left renal cysts which measure less than 20 Hounsfield units but with some cyst demonstrating complexity such as wall calcification or thick margins. Left renal calculi including some speckled calcific densities along the collecting system. Left perinephric stranding and mild proximal periureteral stranding. Stomach/Bowel: Sleeve partial gastrectomy. Vascular/Lymphatic: Mild abdominal aortic atherosclerosis. Enlarged periaortic lymph nodes at the level of the left kidney, including a 1.6 cm node on image 32 series 2. Reproductive: Uterus absent. Other: No supplemental non-categorized findings. Musculoskeletal: Lower lumbar degenerative facet arthropathy. Supraumbilical hernia contains adipose  tissue as shown on image 107 series 6. Moderate to prominent degenerative hip arthropathy bilaterally. IMPRESSION: 1. Left hydronephrosis with complex high density material in the left collecting system along with abnormal gas in the left collecting system. If the patient has not had recent urologic instrumentation of the left kidney and/or lithotripsy, then the appearance is suspicious for pyonephrosis, obstruction, and possibly pyelonephritis/renal abscess. 2. Left renal calculi including some speckled calcific densities along the collecting system. 3. Enlarged periaortic lymph nodes at the level of the left kidney, including a 1.6 cm node. Likely reactive, less likely due to malignancy. Follow up to ensure clearance recommended. 4. Low-density blood pool favors anemia. 5. Peripheral airspace opacities in both lower lobes appear to have associated volume loss and accordingly favor atelectasis over pneumonia. 6. Small type 1 hiatal hernia. 7. Supraumbilical hernia contains adipose tissue. 8. Moderate to prominent degenerative hip arthropathy bilaterally. 9. Lower lumbar degenerative facet arthropathy. 10. Aortic  Atherosclerosis (ICD10-I70.0) and Emphysema (ICD10-J43.9). Electronically Signed   By: Ryan Salvage M.D.   On: 09/28/2023 17:27   DG Chest Portable 1 View Result Date: 09/28/2023 CLINICAL DATA:  Short of breath EXAM: PORTABLE CHEST 1 VIEW COMPARISON:  None Available. FINDINGS: Normal mediastinum and cardiac silhouette. Normal pulmonary vasculature. No evidence of effusion, infiltrate, or pneumothorax. No acute bony abnormality. IMPRESSION: No acute cardiopulmonary process. Electronically Signed   By: Jackquline Boxer M.D.   On: 09/28/2023 15:17     Discharge Exam: Vitals:   10/01/23 0904 10/01/23 1309  BP: (!) 123/91 100/62  Pulse: 72 83  Resp: 16 16  Temp: 99.1 F (37.3 C) 99.1 F (37.3 C)  SpO2: 97% 94%   Vitals:   09/30/23 0623 10/01/23 0559 10/01/23 0904 10/01/23 1309  BP: 106/70 92/63 (!) 123/91 100/62  Pulse: 63 74 72 83  Resp: 16 18 16 16   Temp: 97.8 F (36.6 C) 98.5 F (36.9 C) 99.1 F (37.3 C) 99.1 F (37.3 C)  TempSrc: Oral Oral Oral Oral  SpO2: 98% 95% 97% 94%  Weight:      Height:        General: Pt is alert, awake, not in acute distress Cardiovascular: RRR, S1/S2 +, no rubs, no gallops Respiratory: CTA bilaterally, no wheezing, no rhonchi Abdominal: Soft, NT, ND, bowel sounds + Extremities: no edema, no cyanosis    The results of significant diagnostics from this hospitalization (including imaging, microbiology, ancillary and laboratory) are listed below for reference.     Microbiology: Recent Results (from the past 240 hours)  Urine Culture     Status: Abnormal   Collection Time: 09/28/23  2:22 PM   Specimen: In/Out Cath Urine  Result Value Ref Range Status   Specimen Description   Final    IN/OUT CATH URINE Performed at [facility], [address], [place], [place] [address]    Special Requests   Final    Normal Performed at [facility], [address] [place], [place] [address]    Culture   Final     Two isolates with different morphologies were identified as the same organism.The most resistant organism was reported. >=100,000 COLONIES/mL ESCHERICHIA COLI    Report Status 09/30/2023 FINAL  Final   Organism ID, Bacteria ESCHERICHIA COLI (A)  Final      Susceptibility   Escherichia coli - MIC*    AMPICILLIN >=32 RESISTANT Resistant     CEFAZOLIN <=4 SENSITIVE Sensitive     CEFEPIME <=0.12 SENSITIVE Sensitive  CEFTRIAXONE  <=0.25 SENSITIVE Sensitive     CIPROFLOXACIN  <=0.25 SENSITIVE Sensitive     GENTAMICIN <=1 SENSITIVE Sensitive     IMIPENEM <=0.25 SENSITIVE Sensitive     NITROFURANTOIN  32 SENSITIVE Sensitive     TRIMETH /SULFA  <=20 SENSITIVE Sensitive     AMPICILLIN/SULBACTAM >=32 RESISTANT Resistant     PIP/TAZO <=4 SENSITIVE Sensitive ug/mL    * >=100,000 COLONIES/mL ESCHERICHIA COLI  Blood culture (routine x 2)     Status: None (Preliminary result)   Collection Time: 09/28/23  2:47 PM   Specimen: BLOOD  Result Value Ref Range Status   Specimen Description   Final    BLOOD RIGHT ANTECUBITAL Performed at [facility], [address] [place], [place] [address]    Special Requests   Final    BOTTLES DRAWN AEROBIC AND ANAEROBIC Blood Culture adequate volume Performed at [facility], [address], [place], [place] [address]    Culture   Final    NO GROWTH 3 DAYS Performed at [facility], [address] [place], [place] [address]    Report Status PENDING  Incomplete  Blood culture (routine x 2)     Status: None (Preliminary result)   Collection Time: 09/28/23  3:54 PM   Specimen: BLOOD  Result Value Ref Range Status   Specimen Description   Final    BLOOD LEFT ANTECUBITAL Performed at [facility], [address], [place], [place] [address]    Special Requests   Final    BOTTLES DRAWN AEROBIC AND ANAEROBIC Blood Culture adequate volume Performed at [facility], [address], [place], [place] [address]    Culture   Final    NO GROWTH 3 DAYS Performed at [facility], [address] [place], [place] [address]    Report Status PENDING  Incomplete  Surgical pcr screen     Status: None   Collection Time: 09/29/23  2:56 AM   Specimen: Nasal Mucosa; Nasal Swab  Result Value Ref Range Status   MRSA, PCR NEGATIVE NEGATIVE Final   Staphylococcus aureus NEGATIVE NEGATIVE Final    Comment: (NOTE) The Xpert SA Assay (FDA approved for NASAL specimens in patients 78 years of age and older), is one component of a comprehensive surveillance program. It is not intended to diagnose infection nor to guide or monitor treatment. Performed at [facility], [address] [place], [place] [address]      Labs: BNP (last 3 results) No results for input(s): BNP in the last 8760 hours. Basic Metabolic Panel: Recent Labs  Lab 09/28/23 1413 09/29/23 0551 09/30/23 0357 10/01/23 0341  NA 132* 132* 132* 133*  K 3.4* 4.1 4.4 4.1  CL 97* 102 103 102  CO2 24 22 20* 23  GLUCOSE 158* 124* 124* 117*  BUN 19 21* 27* 25*  CREATININE 2.04* 1.62* 1.63* 1.02*  CALCIUM 8.2* 7.7* 7.8* 7.7*   Liver Function Tests: Recent Labs  Lab 09/28/23 1414  AST 32  ALT 22  ALKPHOS 93  BILITOT 1.7*  PROT 6.7  ALBUMIN  2.7*   No results for input(s): LIPASE, AMYLASE in the last 168 hours. No results for input(s): AMMONIA in the last 168 hours. CBC: Recent Labs  Lab 09/28/23 1413 09/29/23 0551 09/30/23 0357 10/01/23 0341  WBC 17.2* 16.2* 16.5* 14.3*  NEUTROABS 14.4*  --   --   --   HGB 11.1* 9.9* 9.7* 10.0*  HCT 33.7* 30.2* 30.8*  31.1*  MCV 102.1* 101.3* 101.3* 103.0*  PLT 251 247 289 368   Cardiac Enzymes: No results for input(s): CKTOTAL, CKMB, CKMBINDEX, TROPONINI in the last 168 hours. BNP: Invalid input(s): POCBNP CBG: No results for input(s): GLUCAP in the last 168 hours. D-Dimer No results for input(s): DDIMER in the  last 72 hours. Hgb A1c No results for input(s): HGBA1C in the last 72 hours. Lipid Profile No results for input(s): CHOL, HDL, LDLCALC, TRIG, CHOLHDL, LDLDIRECT in the last 72 hours. Thyroid  function studies No results for input(s): TSH, T4TOTAL, T3FREE, THYROIDAB in the last 72 hours.  Invalid input(s): FREET3 Anemia work up No results for input(s): VITAMINB12, FOLATE, FERRITIN, TIBC, IRON, RETICCTPCT in the last 72 hours. Urinalysis    Component Value Date/Time   COLORURINE YELLOW 09/28/2023 1421   APPEARANCEUR CLOUDY (A) 09/28/2023 1421   LABSPEC 1.013 09/28/2023 1421   PHURINE 5.0 09/28/2023 1421   GLUCOSEU NEGATIVE 09/28/2023 1421   GLUCOSEU NEGATIVE 04/08/2023 1400   HGBUR NEGATIVE 09/28/2023 1421   BILIRUBINUR NEGATIVE 09/28/2023 1421   BILIRUBINUR neg 12/21/2015 0958   KETONESUR NEGATIVE 09/28/2023 1421   PROTEINUR 100 (A) 09/28/2023 1421   UROBILINOGEN 1.0 04/08/2023 1400   NITRITE POSITIVE (A) 09/28/2023 1421   LEUKOCYTESUR LARGE (A) 09/28/2023 1421   Sepsis Labs Recent Labs  Lab 09/28/23 1413 09/29/23 0551 09/30/23 0357 10/01/23 0341  WBC 17.2* 16.2* 16.5* 14.3*   Microbiology Recent Results (from the past 240 hours)  Urine Culture     Status: Abnormal   Collection Time: 09/28/23  2:22 PM   Specimen: In/Out Cath Urine  Result Value Ref Range Status   Specimen Description   Final    IN/OUT CATH URINE Performed at [facility], [address] [place], [place] [address]    Special Requests   Final    Normal Performed at [facility], [address], [place], [place] [address]    Culture   Final    Two isolates with different morphologies were identified as the same organism.The most resistant organism was reported. >=100,000 COLONIES/mL ESCHERICHIA COLI    Report Status 09/30/2023 FINAL  Final   Organism ID, Bacteria ESCHERICHIA COLI (A)  Final      Susceptibility    Escherichia coli - MIC*    AMPICILLIN >=32 RESISTANT Resistant     CEFAZOLIN <=4 SENSITIVE Sensitive     CEFEPIME <=0.12 SENSITIVE Sensitive     CEFTRIAXONE  <=0.25 SENSITIVE Sensitive     CIPROFLOXACIN  <=0.25 SENSITIVE Sensitive     GENTAMICIN <=1 SENSITIVE Sensitive     IMIPENEM <=0.25 SENSITIVE Sensitive     NITROFURANTOIN  32 SENSITIVE Sensitive     TRIMETH /SULFA  <=20 SENSITIVE Sensitive     AMPICILLIN/SULBACTAM >=32 RESISTANT Resistant     PIP/TAZO <=4 SENSITIVE Sensitive ug/mL    * >=100,000 COLONIES/mL ESCHERICHIA COLI  Blood culture (routine x 2)     Status: None (Preliminary result)   Collection Time: 09/28/23  2:47 PM   Specimen: BLOOD  Result Value Ref Range Status   Specimen Description   Final    BLOOD RIGHT ANTECUBITAL Performed at [facility], [address] [place], [place] [address]    Special Requests   Final    BOTTLES DRAWN AEROBIC AND ANAEROBIC Blood Culture adequate volume Performed at [facility], [address], [place], [place] [address]    Culture   Final    NO GROWTH 3 DAYS Performed at [facility],  [address] [place], [place] [address]    Report Status PENDING  Incomplete  Blood culture (routine x 2)     Status: None (Preliminary result)   Collection Time: 09/28/23  3:54 PM   Specimen: BLOOD  Result Value Ref Range Status   Specimen Description   Final    BLOOD LEFT ANTECUBITAL Performed at [facility], [address] [place], [place] [address]    Special Requests   Final    BOTTLES DRAWN AEROBIC AND ANAEROBIC Blood Culture adequate volume Performed at [facility], [address] [place], [place] [address]    Culture   Final    NO GROWTH 3 DAYS Performed at [facility], [address], [place], [place] [address]    Report Status PENDING  Incomplete  Surgical pcr screen     Status: None   Collection Time: 09/29/23  2:56 AM   Specimen: Nasal  Mucosa; Nasal Swab  Result Value Ref Range Status   MRSA, PCR NEGATIVE NEGATIVE Final   Staphylococcus aureus NEGATIVE NEGATIVE Final    Comment: (NOTE) The Xpert SA Assay (FDA approved for NASAL specimens in patients 1 years of age and older), is one component of a comprehensive surveillance program. It is not intended to diagnose infection nor to guide or monitor treatment. Performed at [facility], [address] [place], [place] [address]     FURTHER DISCHARGE INSTRUCTIONS:   Get Medicines reviewed and adjusted: Please take all your medications with you for your next visit with your Primary MD   Laboratory/radiological data: Please request your Primary MD to go over all hospital tests and procedure/radiological results at the follow up, please ask your Primary MD to get all Hospital records sent to his/her office.   In some cases, they will be blood work, cultures and biopsy results pending at the time of your discharge. Please request that your primary care M.D. goes through all the records of your hospital data and follows up on these results.   Also Note the following: If you experience worsening of your admission symptoms, develop shortness of breath, life threatening emergency, suicidal or homicidal thoughts you must seek medical attention immediately by calling 911 or calling your MD immediately  if symptoms less severe.   You must read complete instructions/literature along with all the possible adverse reactions/side effects for all the Medicines you take and that have been prescribed to you. Take any new Medicines after you have completely understood and accpet all the possible adverse reactions/side effects.    patient was instructed, not to drive, operate heavy machinery, perform activities at heights, swimming or participation in water  activities or provide baby-sitting services while on Pain, Sleep and Anxiety Medications; until their outpatient  Physician has advised to do so again. Also recommended to not to take more than prescribed Pain, Sleep and Anxiety Medications.  It is not advisable to combine anxiety, sleep and pain medications without talking with your primary care provider.     Wear Seat belts while driving.   Please note: You were cared for by a hospitalist during your hospital stay. Once you are discharged, your primary care physician will handle any further medical issues. Please note that NO REFILLS for any discharge medications will be authorized once you are discharged, as it is imperative that you return to your primary care physician (or establish a relationship with a primary care physician if you do not have one) for your  post hospital discharge needs so that they can reassess your need for medications and monitor your lab values  Time coordinating discharge: Over 30 minutes  SIGNED:   Fredia Skeeter, MD  [facility] 10/01/2023, 1:28 PM *Please note that this is a verbal dictation therefore any spelling or grammatical errors are due to the Dragon Medical One system interpretation. If 7PM-7AM, please contact night-coverage [email or web address]

## 2023-10-01 NOTE — TOC Progression Note (Addendum)
 Transition of Care [facility]) - Progression Note    Patient Details  Name: Hailey Vance MRN: [id number] Date of Birth: [birth year]  Transition of Care [facility]) CM/SW Contact  Alfonse JONELLE Rex, RN Phone Number: 10/01/2023, 1:34 PM  Clinical Narrative:   PT eval completed, recommendation for [facility] PT, pt agreeable, no preference  -4:23pm [facility], rep-Amy, accepted for [facility] PT ,added to AVS. No further TOC needs identified at this time.     Expected Discharge Plan: Home/Self Care Barriers to Discharge: Continued Medical Work up               Expected Discharge Plan and Services       Living arrangements for the past 2 months: Apartment Expected Discharge Date: 10/01/23                                     Social Drivers of Health (SDOH) Interventions SDOH Screenings   Food Insecurity: No Food Insecurity (09/28/2023)  Housing: Low Risk  (09/28/2023)  Transportation Needs: No Transportation Needs (09/28/2023)  Utilities: Not At Risk (09/28/2023)  Alcohol Screen: Low Risk  (01/01/2023)  Depression (PHQ2-9): Low Risk  (01/01/2023)  Financial Resource Strain: Low Risk  (01/01/2023)  Physical Activity: Inactive (01/01/2023)  Social Connections: Socially Isolated (01/01/2023)  Stress: No Stress Concern Present (01/01/2023)  Tobacco Use: Low Risk  (09/28/2023)  Health Literacy: Adequate Health Literacy (01/01/2023)    Readmission Risk Interventions    09/29/2023   11:12 AM  Readmission Risk Prevention Plan  Post Dischage Appt Complete  Medication Screening Complete  Transportation Screening Complete

## 2023-10-01 NOTE — TOC Transition Note (Signed)
 Transition of Care [facility]) - Discharge Note   Patient Details  Name: Hailey Vance MRN: [id number] Date of Birth: [birth year]  Transition of Care [facility]) CM/SW Contact:  Alfonse JONELLE Rex, RN Phone Number: 10/01/2023, 4:25 PM   Clinical Narrative:  DC to Home. [facility], rep-Amy, accepted for [facility] PT ,added to AVS. No further TOC needs identified at this time.      Final next level of care: Home w Home Health Services Barriers to Discharge: Barriers Resolved   Patient Goals and CMS Choice Patient states their goals for this hospitalization and ongoing recovery are:: return home CMS Medicare.gov Compare Post Acute Care list provided to:: Patient Choice offered to / list presented to : Patient [place] ownership interest in [facility].provided to:: Patient    Discharge Placement                       Discharge Plan and Services Additional resources added to the After Visit Summary for                            [facility] Arranged: PT [facility] Agency: [facility] Date [facility] Agency Contacted: 10/01/23 Time HH Agency Contacted: [id number] Representative spoke with at [facility] Agency: Amy  Social Drivers of Health (SDOH) Interventions SDOH Screenings   Food Insecurity: No Food Insecurity (09/28/2023)  Housing: Low Risk  (09/28/2023)  Transportation Needs: No Transportation Needs (09/28/2023)  Utilities: Not At Risk (09/28/2023)  Alcohol Screen: Low Risk  (01/01/2023)  Depression (PHQ2-9): Low Risk  (01/01/2023)  Financial Resource Strain: Low Risk  (01/01/2023)  Physical Activity: Inactive (01/01/2023)  Social Connections: Socially Isolated (01/01/2023)  Stress: No Stress Concern Present (01/01/2023)  Tobacco Use: Low Risk  (09/28/2023)  Health Literacy: Adequate Health Literacy (01/01/2023)     Readmission Risk Interventions    09/29/2023   11:12 AM  Readmission Risk Prevention Plan  Post Dischage Appt Complete  Medication Screening Complete  Transportation  Screening Complete

## 2023-10-01 NOTE — Evaluation (Signed)
 Occupational Therapy Evaluation Patient Details Name: Hailey Vance MRN: [id number] DOB: [birth year] Today's Date: 10/01/2023   History of Present Illness   58 yr old female who presented to the ED for evaluation of left flank pain. Imaging in the ED confirmed pyelonephritis/renal abscess, urology performed cystoscopy with stent placement overnight on the 28th/29th with marked improvement in symptoms.  Pt with medical history significant for HTN, osteoarthritis, OSA, anxiety and depression, kidney stones, GERD, neuropathy, morbid obesity s/p gastric bypass and chronic back pain     Clinical Impressions The pt is currently presenting with the below listed deficits (see OT problem list). During the session, she required CGA for most assessed tasks, including lower body dressing, sit to stand, and hand washing in standing at the sink. She reported participating in minimal activity at her baseline. She also reported having 5/10 L flank pain & quick fatigue with light activity. She will benefit from further OT services to maximize her independence with self-care tasks and decrease the risk for restricted participation in meaningful activities. Home health OT is recommended.      If plan is discharge home, recommend the following:   Assistance with cooking/housework;Assist for transportation     Functional Status Assessment   Patient has had a recent decline in their functional status and demonstrates the ability to make significant improvements in function in a reasonable and predictable amount of time.     Equipment Recommendations   None recommended by OT     Recommendations for Other Services         Precautions/Restrictions   Restrictions Weight Bearing Restrictions Per Provider Order: No     Mobility Bed Mobility      General bed mobility comments: she was received seated in the chair    Transfers Overall transfer level: Needs assistance Equipment used:  Rolling walker (2 wheels) Transfers: Sit to/from Stand Sit to Stand: Contact guard assist                  Balance     Sitting balance-Leahy Scale: Good       Standing balance-Leahy Scale:  (CGA with RW)          ADL either performed or assessed with clinical judgement   ADL Overall ADL's : Needs assistance/impaired Eating/Feeding: Independent;Sitting   Grooming: Set up;Contact guard assist;Sitting;Standing Grooming Details (indicate cue type and reason): Set-up seated or CGA standing         Upper Body Dressing : Set up;Sitting   Lower Body Dressing: Contact guard assist;Sit to/from stand   Toilet Transfer: Contact guard assist;Rolling walker (2 wheels);Ambulation Toilet Transfer Details (indicate cue type and reason): at bathroom level, based on clinical judgement Toileting- Clothing Manipulation and Hygiene: Contact guard assist;Sit to/from stand Toileting - Clothing Manipulation Details (indicate cue type and reason): at bathroom level, based on clinical judgement              Pertinent Vitals/Pain Pain Assessment Pain Assessment: 0-10 Pain Score: 5  Pain Location: left side and back Pain Intervention(s): Limited activity within patient's tolerance, Monitored during session     Extremity/Trunk Assessment Upper Extremity Assessment Upper Extremity Assessment: Right hand dominant;RUE deficits/detail;LUE deficits/detail RUE Deficits / Details: AROM WFL. Functional grip strength LUE Deficits / Details: AROM WFL. Functional grip strength   Lower Extremity Assessment Lower Extremity Assessment: Overall WFL for tasks assessed;RLE deficits/detail;LLE deficits/detail RLE Deficits / Details: AROM WFL LLE Deficits / Details: AROM WFL     Communication Communication Communication: No  apparent difficulties   Cognition Arousal: Alert Behavior During Therapy: WFL for tasks assessed/performed               OT - Cognition Comments: Oriented x4                  Following commands: Intact                  Home Living Family/patient expects to be discharged to:: Private residence Living Arrangements: Children (Son, daughter, and multiple young grandkids) Available Help at Discharge: Family Type of Home: Apartment Home Access: Level entry     Home Layout: One level     Bathroom Shower/Tub: Chief Strategy Officer: Standard     Home Equipment: Rollator (4 wheels);Shower seat          Prior Functioning/Environment Prior Level of Function : Independent/Modified Independent             Mobility Comments: used rollator at home and walked short distances ADLs Comments:  (Modified independent to independent with ADLs. She reported limited overall activity, due to quick fatigue. Her family managed the cooking and cleaning.)    OT Problem List: Decreased strength;Decreased activity tolerance;Impaired balance (sitting and/or standing);Decreased knowledge of use of DME or AE;Pain   OT Treatment/Interventions: Self-care/ADL training;Therapeutic exercise;Therapeutic activities;Energy conservation;Patient/family education;DME and/or AE instruction;Balance training      OT Goals(Current goals can be found in the care plan section)   Acute Rehab OT Goals OT Goal Formulation: With patient/family Time For Goal Achievement: 10/15/23 Potential to Achieve Goals: Good ADL Goals Pt Will Perform Lower Body Dressing: with modified independence;sitting/lateral leans;sit to/from stand Pt Will Transfer to Toilet: with modified independence;ambulating Pt Will Perform Toileting - Clothing Manipulation and hygiene: with modified independence;sit to/from stand   OT Frequency:  Min 2X/week       AM-PAC OT 6 Clicks Daily Activity     Outcome Measure Help from another person eating meals?: None Help from another person taking care of personal grooming?: A Little Help from another person toileting, which includes  using toliet, bedpan, or urinal?: A Little Help from another person bathing (including washing, rinsing, drying)?: A Little Help from another person to put on and taking off regular upper body clothing?: None Help from another person to put on and taking off regular lower body clothing?: A Little 6 Click Score: 20   End of Session Equipment Utilized During Treatment: Rolling walker (2 wheels) Nurse Communication: Mobility status  Activity Tolerance: Patient limited by fatigue Patient left: in chair;with call bell/phone within reach;with family/visitor present  OT Visit Diagnosis: Muscle weakness (generalized) (M62.81);History of falling (Z91.81);Unsteadiness on feet (R26.81)                Time: 8652-8597 OT Time Calculation (min): 15 min Charges:  OT General Charges $OT Visit: 1 Visit OT Evaluation $OT Eval Moderate Complexity: 1 Mod    Landynn Dupler L Verline Kong, OTR/L 10/01/2023, 2:36 PM

## 2023-10-01 NOTE — Telephone Encounter (Signed)
 LVM for patient to call office back and schedule

## 2023-10-01 NOTE — Progress Notes (Signed)
 Discharge mediations delivered to patient at the bedside [facility]

## 2023-10-02 ENCOUNTER — Other Ambulatory Visit (HOSPITAL_BASED_OUTPATIENT_CLINIC_OR_DEPARTMENT_OTHER): Payer: Self-pay

## 2023-10-03 LAB — CULTURE, BLOOD (ROUTINE X 2)
Culture: NO GROWTH
Culture: NO GROWTH
Special Requests: ADEQUATE
Special Requests: ADEQUATE

## 2023-10-06 DIAGNOSIS — Z79899 Other long term (current) drug therapy: Secondary | ICD-10-CM | POA: Diagnosis not present

## 2023-10-06 DIAGNOSIS — Z9181 History of falling: Secondary | ICD-10-CM | POA: Diagnosis not present

## 2023-10-06 DIAGNOSIS — I10 Essential (primary) hypertension: Secondary | ICD-10-CM | POA: Diagnosis not present

## 2023-10-06 DIAGNOSIS — N390 Urinary tract infection, site not specified: Secondary | ICD-10-CM | POA: Diagnosis not present

## 2023-10-06 DIAGNOSIS — A499 Bacterial infection, unspecified: Secondary | ICD-10-CM | POA: Diagnosis not present

## 2023-10-06 DIAGNOSIS — M170 Bilateral primary osteoarthritis of knee: Secondary | ICD-10-CM | POA: Diagnosis not present

## 2023-10-06 DIAGNOSIS — N1831 Chronic kidney disease, stage 3a: Secondary | ICD-10-CM | POA: Diagnosis not present

## 2023-10-06 DIAGNOSIS — G629 Polyneuropathy, unspecified: Secondary | ICD-10-CM | POA: Diagnosis not present

## 2023-10-06 DIAGNOSIS — Z09 Encounter for follow-up examination after completed treatment for conditions other than malignant neoplasm: Secondary | ICD-10-CM | POA: Diagnosis not present

## 2023-10-07 DIAGNOSIS — N16 Renal tubulo-interstitial disorders in diseases classified elsewhere: Secondary | ICD-10-CM | POA: Diagnosis not present

## 2023-10-07 DIAGNOSIS — K219 Gastro-esophageal reflux disease without esophagitis: Secondary | ICD-10-CM | POA: Diagnosis not present

## 2023-10-07 DIAGNOSIS — N151 Renal and perinephric abscess: Secondary | ICD-10-CM | POA: Diagnosis not present

## 2023-10-07 DIAGNOSIS — N136 Pyonephrosis: Secondary | ICD-10-CM | POA: Diagnosis not present

## 2023-10-07 DIAGNOSIS — Z87442 Personal history of urinary calculi: Secondary | ICD-10-CM | POA: Diagnosis not present

## 2023-10-07 DIAGNOSIS — Z7985 Long-term (current) use of injectable non-insulin antidiabetic drugs: Secondary | ICD-10-CM | POA: Diagnosis not present

## 2023-10-07 DIAGNOSIS — M545 Low back pain, unspecified: Secondary | ICD-10-CM | POA: Diagnosis not present

## 2023-10-07 DIAGNOSIS — Z960 Presence of urogenital implants: Secondary | ICD-10-CM | POA: Diagnosis not present

## 2023-10-07 DIAGNOSIS — N179 Acute kidney failure, unspecified: Secondary | ICD-10-CM | POA: Diagnosis not present

## 2023-10-07 DIAGNOSIS — G4733 Obstructive sleep apnea (adult) (pediatric): Secondary | ICD-10-CM | POA: Diagnosis not present

## 2023-10-07 DIAGNOSIS — G8929 Other chronic pain: Secondary | ICD-10-CM | POA: Diagnosis not present

## 2023-10-07 DIAGNOSIS — I10 Essential (primary) hypertension: Secondary | ICD-10-CM | POA: Diagnosis not present

## 2023-10-07 DIAGNOSIS — M1990 Unspecified osteoarthritis, unspecified site: Secondary | ICD-10-CM | POA: Diagnosis not present

## 2023-10-07 DIAGNOSIS — G629 Polyneuropathy, unspecified: Secondary | ICD-10-CM | POA: Diagnosis not present

## 2023-10-07 DIAGNOSIS — Z604 Social exclusion and rejection: Secondary | ICD-10-CM | POA: Diagnosis not present

## 2023-10-08 DIAGNOSIS — Z79899 Other long term (current) drug therapy: Secondary | ICD-10-CM | POA: Diagnosis not present

## 2023-10-08 DIAGNOSIS — Z09 Encounter for follow-up examination after completed treatment for conditions other than malignant neoplasm: Secondary | ICD-10-CM | POA: Diagnosis not present

## 2023-10-08 DIAGNOSIS — Z9181 History of falling: Secondary | ICD-10-CM | POA: Diagnosis not present

## 2023-10-08 DIAGNOSIS — N1330 Unspecified hydronephrosis: Secondary | ICD-10-CM | POA: Diagnosis not present

## 2023-10-08 DIAGNOSIS — E559 Vitamin D deficiency, unspecified: Secondary | ICD-10-CM | POA: Diagnosis not present

## 2023-10-08 DIAGNOSIS — Z1231 Encounter for screening mammogram for malignant neoplasm of breast: Secondary | ICD-10-CM | POA: Diagnosis not present

## 2023-10-08 DIAGNOSIS — Z780 Asymptomatic menopausal state: Secondary | ICD-10-CM | POA: Diagnosis not present

## 2023-10-13 ENCOUNTER — Encounter: Payer: Self-pay | Admitting: Gastroenterology

## 2023-10-13 ENCOUNTER — Ambulatory Visit: Admitting: Gastroenterology

## 2023-10-13 VITALS — BP 134/76 | HR 75 | Ht 63.0 in | Wt 259.0 lb

## 2023-10-13 DIAGNOSIS — R531 Weakness: Secondary | ICD-10-CM | POA: Diagnosis not present

## 2023-10-13 DIAGNOSIS — R5381 Other malaise: Secondary | ICD-10-CM | POA: Diagnosis not present

## 2023-10-13 DIAGNOSIS — R109 Unspecified abdominal pain: Secondary | ICD-10-CM | POA: Diagnosis not present

## 2023-10-13 DIAGNOSIS — R0602 Shortness of breath: Secondary | ICD-10-CM | POA: Diagnosis not present

## 2023-10-13 DIAGNOSIS — Z79899 Other long term (current) drug therapy: Secondary | ICD-10-CM | POA: Diagnosis not present

## 2023-10-13 DIAGNOSIS — R1110 Vomiting, unspecified: Secondary | ICD-10-CM

## 2023-10-13 DIAGNOSIS — R0609 Other forms of dyspnea: Secondary | ICD-10-CM

## 2023-10-13 DIAGNOSIS — R1310 Dysphagia, unspecified: Secondary | ICD-10-CM

## 2023-10-13 DIAGNOSIS — K588 Other irritable bowel syndrome: Secondary | ICD-10-CM

## 2023-10-13 DIAGNOSIS — R600 Localized edema: Secondary | ICD-10-CM | POA: Diagnosis not present

## 2023-10-13 MED ORDER — DICYCLOMINE HCL 10 MG PO CAPS: 10.0000 mg | ORAL_CAPSULE | Freq: Three times a day (TID) | ORAL | 0 refills | Status: AC

## 2023-10-13 MED ORDER — DICYCLOMINE HCL 10 MG PO CAPS: 10.0000 mg | ORAL_CAPSULE | Freq: Every day | ORAL | 3 refills | Status: AC | PRN

## 2023-10-13 NOTE — Progress Notes (Signed)
 Hailey Vance    [id number]    Jul 31, 1965  Primary Care Physician:Goldman, Heron Ruth, PA-C  Referring Physician: No referring provider defined for this encounter.   Chief complaint:  Dysphagia   History of Present Illness Hailey Vance is a 58 year old female who presents with complaints of dysphagia.  Dysphagia and oropharyngeal symptoms - Significant swallowing difficulties with frequent choking on food, drinks, and saliva nearly every day - Adaptation by chewing food thoroughly, but continued difficulty with liquids and tougher foods - No regurgitation - Liquids cause sensation of 'filling up' in the throat  Urinary tract infection and renal involvement - Initial urinary tract infection treated with antibiotics, but symptoms persisted after completion of therapy - Progression to pyelonephritis requiring hospitalization for four days starting September 28, 2023 [facility] course complicated by significant flank pain and hematuria - Renal stent placed for management of infection and pus in the kidney - Persistent flank pain and significant weakness following discharge - Scheduled for stent follow-up on October 20, 2023   Dyspnea and peripheral edema - Easily winded with minimal exertion - Lower extremity swelling present - Shortness of breath with walking and sometimes with speaking - No known cardiac disease - Recently discontinued lisinopril   Gastroesophageal reflux and heartburn - Heartburn when lying flat, managed by propping head up  Gastrointestinal symptoms and medication effects - Bowel movements currently once daily - Initial diarrhea, itching, and burning after starting medication, improved after reducing dose from two pills twice daily to one pill twice daily - Occasional vomiting (1-2 times per week) of gastric fluid, not food - Currently taking dicyclomine  as needed for discomfort and continues acid suppression therapy  Unintentional  weight loss - Significant weight loss attributed to recent hospitalization and use of Mounjaro       Outpatient Encounter Medications as of 10/13/2023  Medication Sig   albuterol  (VENTOLIN  HFA) 108 (90 Base) MCG/ACT inhaler INHALE 2 PUFFS BY MOUTH EVERY 6 HOURS AS NEEDED FOR WHEEZING FOR SHORTNESS OF BREATH   amLODipine  (NORVASC ) 5 MG tablet Take 1 tablet (5 mg total) by mouth daily.   Artificial Tear Ointment (DRY EYES OP) Apply to eye as needed.   busPIRone  (BUSPAR ) 7.5 MG tablet Take 1 tablet (7.5 mg total) by mouth 2 (two) times daily.   cefadroxil  (DURICEF) 500 MG capsule Take 2 capsules (1,000 mg total) by mouth 2 (two) times daily for 14 days.   Cholecalciferol (VITAMIN D3 GUMMIES PO) Take 2 tablets by mouth daily. Reports 5000 IU takes 2 gummies daily   cyanocobalamin  (VITAMIN B12) 1000 MCG tablet Take 1 tablet (1,000 mcg total) by mouth daily.   cyanocobalamin  (VITAMIN B12) 1000 MCG/ML injection Inject 1 mL (1,000 mcg total) into the muscle every 30 (thirty) days.   diclofenac  Sodium (VOLTAREN ) 1 % GEL Apply 2 g topically daily as needed.   DULoxetine  (CYMBALTA ) 60 MG capsule Take 1 capsule (60 mg total) by mouth daily.   famotidine  (PEPCID ) 20 MG tablet Take 1 tablet (20 mg total) by mouth at bedtime.   gabapentin  (NEURONTIN ) 300 MG capsule Take 1 capsule (300 mg total) by mouth 3 (three) times daily.   metoprolol  succinate (TOPROL -XL) 50 MG 24 hr tablet Take 1 tablet (50 mg total) by mouth daily. Take with or immediately following a meal.   multivitamin-iron-minerals-folic acid (CENTRUM) chewable tablet Chew 1 tablet by mouth daily.   NEEDLE, DISP, 25 G (B-D DISP NEEDLE 25GX1) 25G  X 1 MISC For B12 injections   nystatin  (MYCOSTATIN /NYSTOP ) powder Apply 1 Application topically 2 (two) times daily.   omeprazole  (PRILOSEC) 40 MG capsule Take 1 capsule (40 mg total) by mouth daily. Daily 30 minutes before breakfast   ondansetron  (ZOFRAN ) 4 MG tablet Take 1 tablet (4 mg total) by mouth  every 8 (eight) hours as needed for nausea or vomiting.   oxyCODONE -acetaminophen  (PERCOCET) 10-325 MG tablet Take 1 tablet by mouth 3 (three) times daily as needed for pain.   SYRINGE-NEEDLE, DISP, 3 ML 23G X 1 3 ML MISC For B12 injections   tirzepatide  (MOUNJARO ) 12.5 MG/0.5ML Pen Inject 12.5 mg into the skin once a week.   Vitamin D , Ergocalciferol , (DRISDOL ) 1.25 MG (50000 UNIT) CAPS capsule Take 1 capsule (50,000 Units total) by mouth every 7 (seven) days.   No facility-administered encounter medications on file as of 10/13/2023.    Allergies as of 10/13/2023 - Review Complete 09/29/2023  Allergen Reaction Noted   Penicillins Itching and Other (See Comments) 04/27/2011    Past Medical History:  Diagnosis Date   Bilateral primary osteoarthritis of knee 03/29/2019   Chronic back pain    Cystocele with rectocele    Generalized anxiety disorder    GERD (gastroesophageal reflux disease)    Headache    otc med prn   Heart murmur    echo in epic 11-26-2017  normal w/ ef 55-60%   History of chest pain    pt referred for atyical chest pain , cardiology evaluation w/ dr berry note in epic 10-25-2014, stated with low risk w/ no ischemia , nuclear ef 70% , done 09-19-2014 in epic,  non-cardiac chest pain   HTN (hypertension)    followed by pcp    (normal nuclear stress test 09-19-2014 in epic)   Low blood potassium    Major depressive disorder    OSA (obstructive sleep apnea)    04/2015 study in e- Severe OSA (AHI = 55.0/hour);   (10-06-2019  per pt has not used cpap since 2019 after gastric bypass)   Osteoarthritis of both hips 06/15/2019   Plantar fasciitis of left foot    S/P gastric bypass 02/15/2018   sleeve   SUI (stress urinary incontinence, female)    Vitamin D  deficiency 09/06/2014   Wears glasses     Past Surgical History:  Procedure Laterality Date   BLADDER SUSPENSION N/A 10/12/2019   Procedure: TRANSVAGINAL TAPE (TVT) PROCEDURE;  Surgeon: Sarrah Browning, MD;   Location: [facility];  Service: Gynecology;  Laterality: N/A;   CESAREAN SECTION W/BTL  1990   COLONOSCOPY WITH PROPOFOL  N/A 08/30/2015   Procedure: COLONOSCOPY WITH PROPOFOL ;  Surgeon: Gustav LULLA Mcgee, MD;  Location: [facility];  Service: Endoscopy;  Laterality: N/A;   CYSTOCELE REPAIR N/A 10/12/2019   Procedure: ANTERIOR (CYSTOCELE);  Surgeon: Sarrah Browning, MD;  Location: [facility];  Service: Gynecology;  Laterality: N/A;   CYSTOSCOPY N/A 05/23/2015   Procedure: CYSTOSCOPY;  Surgeon: Browning Sarrah, MD;  Location: [facility];  Service: Gynecology;  Laterality: N/A;   CYSTOSCOPY N/A 10/12/2019   Procedure: CYSTOSCOPY;  Surgeon: Sarrah Browning, MD;  Location: [facility];  Service: Gynecology;  Laterality: N/A;   CYSTOSCOPY WITH RETROGRADE PYELOGRAM, URETEROSCOPY AND STENT PLACEMENT Left 09/29/2023   Procedure: CYSTOSCOPY WITH RETROGRADE PYELOGRAM AND STENT INSERTION;  Surgeon: Roseann Adine PARAS., MD;  Location: [facility];  Service: Urology;  Laterality: Left;   HIATAL HERNIA REPAIR N/A 02/15/2018   Procedure: HERNIA  REPAIR HIATAL;  Surgeon: Mikell Katz, MD;  Location: [facility];  Service: General;  Laterality: N/A;   HYSTEROSCOPY WITH D & C N/A 01/11/2015   Procedure: DILATATION AND CURETTAGE /HYSTEROSCOPY;  Surgeon: Jolene Gaskins, MD;  Location: [facility];  Service: Gynecology;  Laterality: N/A;   KNEE ARTHROSCOPY Right 2010   LAPAROSCOPIC GASTRIC SLEEVE RESECTION N/A 02/15/2018   Procedure: LAPAROSCOPIC GASTRIC SLEEVE RESECTION WITH UPPER ENDO AND ERAS PATHWAY;  Surgeon: Mikell Katz, MD;  Location: [facility];  Service: General;  Laterality: N/A;   LAPAROSCOPIC OOPHORECTOMY Bilateral 2004   LEFT OOPHORECTOMY AND PARTIAL RIGHT OOPHORECTOMY   LIPOMA EXCISION Left 12/02/2016   Procedure: EXCISION LIPOMA OF LEFT SHOULDER;  Surgeon: Anderson Maude ORN, MD;  Location: [facility];  Service: Orthopedics;  Laterality: Left;   ROBOTIC ASSISTED TOTAL  HYSTERECTOMY WITH SALPINGECTOMY Bilateral 05/23/2015   Procedure: ROBOTIC ASSISTED TOTAL HYSTERECTOMY WITH SALPINGECTOMYwith right oophorectomy;  Surgeon: Rosaline Luna, MD;  Location: [facility];  Service: Gynecology;  Laterality: Bilateral;   UMBILICAL HERNIA REPAIR  2002    Family History  Problem Relation Age of Onset   Lung cancer Mother 48       dec   Cancer Mother    Hypertension Mother    Arthritis Mother    Cancer Father 58       prostate   Hypertension Father    Leukemia Sister    Hypertension Sister    Arthritis Sister    Hyperlipidemia Sister    Hypertension Brother    Arthritis Brother    Multiple sclerosis Sister    Hypertension Sister    Hypertension Sister    Hypertension Brother    Hypertension Brother    Breast cancer Paternal Aunt    Alzheimer's disease Paternal Aunt    Diabetes Maternal Grandmother    Stroke Maternal Grandmother    Heart attack Paternal Grandmother    Asthma Other     Social History   Socioeconomic History   Marital status: Divorced    Spouse name: Not on file   Number of children: Not on file   Years of education: 12   Highest education level: High school graduate  Occupational History   Occupation: Disability  Tobacco Use   Smoking status: Never   Smokeless tobacco: Never  Vaping Use   Vaping status: Never Used  Substance and Sexual Activity   Alcohol use: No    Alcohol/week: 0.0 standard drinks of alcohol   Drug use: Never   Sexual activity: Not on file    Comment: BTL w/ C/S in 1990  Other Topics Concern   Not on file  Social History Narrative   Divorced   Lives with son and grand-daughter   35- Son Hailey Vance (lives in [place])   [id number]- Son Hailey Vance lives with patient   62- Daughter Hailey Vance (lives in [place])   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   Completed some college   Geophysical data processor   Social Drivers of Health   Financial Resource Strain: Low Risk  (01/01/2023)   Overall Financial Resource Strain  (CARDIA)    Difficulty of Paying Living Expenses: Not very hard  Food Insecurity: No Food Insecurity (09/28/2023)   Hunger Vital Sign    Worried About Running Out of Food in the Last Year: Never true    Ran Out of Food in the Last Year: Never true  Transportation Needs: No Transportation Needs (09/28/2023)   PRAPARE - Administrator, Civil Service (Medical): No  Lack of Transportation (Non-Medical): No  Physical Activity: Inactive (01/01/2023)   Exercise Vital Sign    Days of Exercise per Week: 0 days    Minutes of Exercise per Session: 0 min  Stress: No Stress Concern Present (01/01/2023)   [employer] of Occupational Health - Occupational Stress Questionnaire    Feeling of Stress : Only a little  Social Connections: Socially Isolated (01/01/2023)   Social Connection and Isolation Panel    Frequency of Communication with Friends and Family: Three times a week    Frequency of Social Gatherings with Friends and Family: Once a week    Attends Religious Services: Never    Database administrator or Organizations: No    Attends Banker Meetings: Never    Marital Status: Divorced  Catering manager Violence: Not At Risk (09/28/2023)   Humiliation, Afraid, Rape, and Kick questionnaire    Fear of Current or Ex-Partner: No    Emotionally Abused: No    Physically Abused: No    Sexually Abused: No      Review of systems: All other review of systems negative except as mentioned in the HPI.   Physical Exam: Vitals:   10/13/23 1132  BP: 134/76  Pulse: 75   Body mass index is 45.88 kg/m. Gen:   Dyspneic with minimal exertion HEENT:  sclera anicteric CV: s1s2 rrr, tachycardic, no murmur Lungs: B/l clear. Abd:      soft, left-sided tenderness ; no palpable masses, no distension Ext:    No edema Neuro: alert and oriented x 3 Psych: normal mood and affect  Data Reviewed:  Reviewed labs, radiology imaging, old records and pertinent past GI work  up     Assessment and Plan Assessment & Plan Dysphagia with concern for esophageal stricture or motility disorder Persistent dysphagia with choking on solids and liquids, raising concern for esophageal stricture or motility disorder. Differential includes esophageal narrowing or stricture, and potential tumor. BMI has decreased to 45, allowing for procedure to be done outside the hospital setting. - Schedule endoscopy with possible dilation and biopsy to assess for structural abnormalities and rule out malignancy - Ensure fasting prior to procedure - Schedule procedure for October 3rd at 10 AM  Recent acute pyelonephritis with indwelling ureteral stent Recent hospitalization for acute pyelonephritis with significant pus in the kidney, requiring ureteral stent placement. Persistent flank pain and weakness post-hospitalization. - Continue current management until stent removal on August 19th  Generalized weakness and deconditioning post-hospitalization Generalized weakness and deconditioning following hospitalization for severe infection. - Initiate physical therapy  Shortness of breath and lower extremity edema, evaluation for possible heart failure New onset shortness of breath and lower extremity edema. Differential includes heart failure, possibly due to recent infection or medication changes. Plan to ensure cardiac function is stable before proceeding with anesthesia for endoscopy. - Refer to cardiology for evaluation of heart function - Ensure cardiac evaluation prior to endoscopy  Intermittent vomiting Intermittent vomiting, possibly related to delayed gastric emptying. Occurs once or twice a week, not associated with food intake. Mounjaro  may contribute to symptoms, but vomiting predates its use. - Consider impact of Mounjaro  on symptoms        This visit required >40 minutes of patient care (this includes precharting, chart review, review of results, face-to-face time used  for counseling as well as treatment plan and follow-up. The patient was provided an opportunity to ask questions and all were answered. The patient agreed with the plan and demonstrated an  understanding of the instructions.  LOIS Wilkie Mcgee , MD    CC: No ref. provider found

## 2023-10-13 NOTE — Patient Instructions (Addendum)
 You have been scheduled for an endoscopy. Please follow written instructions given to you at your visit today.  If you use inhalers (even only as needed), please bring them with you on the day of your procedure.  If you take any of the following medications, they will need to be adjusted prior to your procedure:   DO NOT TAKE 7 DAYS PRIOR TO TEST- Trulicity (dulaglutide) Ozempic, Wegovy (semaglutide) Mounjaro  (tirzepatide ) Bydureon Bcise (exanatide extended release)  DO NOT TAKE 1 DAY PRIOR TO YOUR TEST Rybelsus (semaglutide) Adlyxin (lixisenatide) Victoza (liraglutide) Byetta (exanatide) ___________________________________________________________________________   VISIT SUMMARY:  During your visit, we discussed your ongoing symptoms following a urinary tract infection and kidney involvement, as well as new issues with swallowing, shortness of breath, and swelling in your legs. We have planned several follow-up procedures and evaluations to address these concerns.  YOUR PLAN:  DYSPHAGIA: You have been experiencing significant difficulty swallowing, with frequent choking on food, drinks, and saliva. -We will schedule an endoscopy with possible dilation and biopsy to check for any structural abnormalities or tumors in your esophagus. This procedure is set for October 3rd at 10 AM. Please ensure you fast before the procedure.  RECENT ACUTE PYELONEPHRITIS WITH INDWELLING URETERAL STENT: You were recently hospitalized for a severe kidney infection, which required the placement of a ureteral stent. -Continue with your current management plan until the stent is removed on August 19th.  GENERALIZED WEAKNESS AND DECONDITIONING POST-HOSPITALIZATION: You have been feeling generally weak and deconditioned following your hospitalization. -We will start you on a physical therapy program to help regain your strength.  SHORTNESS OF BREATH AND LOWER EXTREMITY EDEMA: You have been experiencing  shortness of breath and swelling in your legs, which may be related to heart issues. -We will refer you to a cardiologist to evaluate your heart function and ensure it is stable before your endoscopy.  INTERMITTENT VOMITING: You have been experiencing occasional vomiting, which may be related to delayed gastric emptying or your medication. -We will consider the impact of your medication, Mounjaro , on these symptoms.  Due to recent changes in healthcare laws, you may see the results of your imaging and laboratory studies on MyChart before your provider has had a chance to review them.  We understand that in some cases there may be results that are confusing or concerning to you. Not all laboratory results come back in the same time frame and the provider may be waiting for multiple results in order to interpret others.  Please give us  48 hours in order for your provider to thoroughly review all the results before contacting the office for clarification of your results.    I appreciate the  opportunity to care for you  Thank You   Kavitha Nandigam , MD

## 2023-10-14 DIAGNOSIS — G8929 Other chronic pain: Secondary | ICD-10-CM | POA: Diagnosis not present

## 2023-10-14 DIAGNOSIS — K219 Gastro-esophageal reflux disease without esophagitis: Secondary | ICD-10-CM | POA: Diagnosis not present

## 2023-10-14 DIAGNOSIS — N151 Renal and perinephric abscess: Secondary | ICD-10-CM | POA: Diagnosis not present

## 2023-10-14 DIAGNOSIS — N16 Renal tubulo-interstitial disorders in diseases classified elsewhere: Secondary | ICD-10-CM | POA: Diagnosis not present

## 2023-10-14 DIAGNOSIS — Z7985 Long-term (current) use of injectable non-insulin antidiabetic drugs: Secondary | ICD-10-CM | POA: Diagnosis not present

## 2023-10-14 DIAGNOSIS — G4733 Obstructive sleep apnea (adult) (pediatric): Secondary | ICD-10-CM | POA: Diagnosis not present

## 2023-10-14 DIAGNOSIS — Z87442 Personal history of urinary calculi: Secondary | ICD-10-CM | POA: Diagnosis not present

## 2023-10-14 DIAGNOSIS — N179 Acute kidney failure, unspecified: Secondary | ICD-10-CM | POA: Diagnosis not present

## 2023-10-14 DIAGNOSIS — I10 Essential (primary) hypertension: Secondary | ICD-10-CM | POA: Diagnosis not present

## 2023-10-14 DIAGNOSIS — N136 Pyonephrosis: Secondary | ICD-10-CM | POA: Diagnosis not present

## 2023-10-14 DIAGNOSIS — G629 Polyneuropathy, unspecified: Secondary | ICD-10-CM | POA: Diagnosis not present

## 2023-10-14 DIAGNOSIS — M545 Low back pain, unspecified: Secondary | ICD-10-CM | POA: Diagnosis not present

## 2023-10-14 DIAGNOSIS — Z604 Social exclusion and rejection: Secondary | ICD-10-CM | POA: Diagnosis not present

## 2023-10-14 DIAGNOSIS — M1990 Unspecified osteoarthritis, unspecified site: Secondary | ICD-10-CM | POA: Diagnosis not present

## 2023-10-15 DIAGNOSIS — N179 Acute kidney failure, unspecified: Secondary | ICD-10-CM | POA: Diagnosis not present

## 2023-10-17 DIAGNOSIS — G4733 Obstructive sleep apnea (adult) (pediatric): Secondary | ICD-10-CM | POA: Diagnosis not present

## 2023-10-20 ENCOUNTER — Encounter: Admitting: Urology

## 2023-10-20 NOTE — Progress Notes (Deleted)
 Assessment: 1. Pyonephrosis, left     Plan: Left ureteral stent removed today. Cipro  x 1 following stent removal Return to office in 1 month  Chief Complaint: No chief complaint on file.   HPI: Hailey Vance is a 58 y.o. female who presents for continued evaluation of left pyelonephrosis. She was seen in consultation at [facility] on 09/29/2023 for evaluation of left hydronephrosis.  She presented to the emergency room with a 3-day history of left flank pain.  She also had dysuria.  No gross hematuria.  She was found to have leukocytosis with a white count of 17.2 K.  Urinalysis was nitrite positive with >50 WBCs, many bacteria.  Creatinine elevated at 2.04.  CT renal stone study showed left hydronephrosis with air in the left collecting system consistent with pyelonephrosis, left renal cyst, and left renal calculi.  She was started on IV Rocephin .  She was taken to the operating room on 09/29/2023 for cystoscopy, left retrograde pyelogram, and insertion of left ureteral stent.  She was found to have purulent material draining from the left UO with dilation of the left renal pelvis and calyces and purulent drainage from the left kidney following stent placement. Urine culture grew >100 K E. coli.  Blood cultures were negative. Her creatinine improved to 1.02. She was discharged with 2 weeks of cefadroxil .  She presents today for cystoscopy and stent removal.  Portions of the above documentation were copied from a prior visit for review purposes only.  Allergies: Allergies  Allergen Reactions   Penicillins Itching and Other (See Comments)    Has patient had a PCN reaction causing immediate rash, facial/tongue/throat swelling, SOB or lightheadedness with hypotension: No Has patient had a PCN reaction causing severe rash involving mucus membranes or skin necrosis: No Has patient had a PCN reaction that required hospitalization No Has patient had a PCN reaction occurring within the  last 10 years: No If all of the above answers are NO, then may proceed with Cephalosporin use.     PMH: Past Medical History:  Diagnosis Date   Bilateral primary osteoarthritis of knee 03/29/2019   Chronic back pain    Cystocele with rectocele    Generalized anxiety disorder    GERD (gastroesophageal reflux disease)    Headache    otc med prn   Heart murmur    echo in epic 11-26-2017  normal w/ ef 55-60%   History of chest pain    pt referred for atyical chest pain , cardiology evaluation w/ dr berry note in epic 10-25-2014, stated with low risk w/ no ischemia , nuclear ef 70% , done 09-19-2014 in epic,  non-cardiac chest pain   HTN (hypertension)    followed by pcp    (normal nuclear stress test 09-19-2014 in epic)   Low blood potassium    Major depressive disorder    OSA (obstructive sleep apnea)    04/2015 study in e- Severe OSA (AHI = 55.0/hour);   (10-06-2019  per pt has not used cpap since 2019 after gastric bypass)   Osteoarthritis of both hips 06/15/2019   Plantar fasciitis of left foot    S/P gastric bypass 02/15/2018   sleeve   SUI (stress urinary incontinence, female)    Vitamin D  deficiency 09/06/2014   Wears glasses     PSH: Past Surgical History:  Procedure Laterality Date   BLADDER SUSPENSION N/A 10/12/2019   Procedure: TRANSVAGINAL TAPE (TVT) PROCEDURE;  Surgeon: Sarrah Browning, MD;  Location: [facility];  Service: Gynecology;  Laterality: N/A;   CESAREAN SECTION W/BTL  1990   COLONOSCOPY WITH PROPOFOL  N/A 08/30/2015   Procedure: COLONOSCOPY WITH PROPOFOL ;  Surgeon: Gustav LULLA Mcgee, MD;  Location: [facility];  Service: Endoscopy;  Laterality: N/A;   CYSTOCELE REPAIR N/A 10/12/2019   Procedure: ANTERIOR (CYSTOCELE);  Surgeon: Sarrah Browning, MD;  Location: [facility];  Service: Gynecology;  Laterality: N/A;   CYSTOSCOPY N/A 05/23/2015   Procedure: CYSTOSCOPY;  Surgeon: Browning Sarrah, MD;  Location: [facility];  Service:  Gynecology;  Laterality: N/A;   CYSTOSCOPY N/A 10/12/2019   Procedure: CYSTOSCOPY;  Surgeon: Sarrah Browning, MD;  Location: [facility];  Service: Gynecology;  Laterality: N/A;   CYSTOSCOPY WITH RETROGRADE PYELOGRAM, URETEROSCOPY AND STENT PLACEMENT Left 09/29/2023   Procedure: CYSTOSCOPY WITH RETROGRADE PYELOGRAM AND STENT INSERTION;  Surgeon: Roseann Adine PARAS., MD;  Location[facility];  Service: Urology;  Laterality: Left;   HIATAL HERNIA REPAIR N/A 02/15/2018   Procedure: HERNIA REPAIR HIATAL;  Surgeon: Mikell Katz, MD;  Location: [facility];  Service: General;  Laterality: N/A;   HYSTEROSCOPY WITH D & C N/A 01/11/2015   Procedure: DILATATION AND CURETTAGE /HYSTEROSCOPY;  Surgeon: Jolene Gaskins, MD;  Location: [facility];  Service: Gynecology;  Laterality: N/A;   KNEE ARTHROSCOPY Right 2010   LAPAROSCOPIC GASTRIC SLEEVE RESECTION N/A 02/15/2018   Procedure: LAPAROSCOPIC GASTRIC SLEEVE RESECTION WITH UPPER ENDO AND ERAS PATHWAY;  Surgeon: Mikell Katz, MD;  Location[facility];  Service: General;  Laterality: N/A;   LAPAROSCOPIC OOPHORECTOMY Bilateral 2004   LEFT OOPHORECTOMY AND PARTIAL RIGHT OOPHORECTOMY   LIPOMA EXCISION Left 12/02/2016   Procedure: EXCISION LIPOMA OF LEFT SHOULDER;  Surgeon: Anderson Maude ORN, MD;  Location[facility];  Service: Orthopedics;  Laterality: Left;   ROBOTIC ASSISTED TOTAL HYSTERECTOMY WITH SALPINGECTOMY Bilateral 05/23/2015   Procedure: ROBOTIC ASSISTED TOTAL HYSTERECTOMY WITH SALPINGECTOMYwith right oophorectomy;  Surgeon: Browning Sarrah, MD;  Location: [facility];  Service: Gynecology;  Laterality: Bilateral;   UMBILICAL HERNIA REPAIR  2002    SH: Social History   Tobacco Use   Smoking status: Never   Smokeless tobacco: Never  Vaping Use   Vaping status: Never Used  Substance Use Topics   Alcohol use: No    Alcohol/week: 0.0 standard drinks of alcohol   Drug use: Never    ROS: Constitutional:  Negative for fever, chills, weight  loss CV: Negative for chest pain, previous MI, hypertension Respiratory:  Negative for shortness of breath, wheezing, sleep apnea, frequent cough GI:  Negative for nausea, vomiting, bloody stool, GERD  PE: LMP 10/02/2007  GENERAL APPEARANCE:  Well appearing, well developed, well nourished, NAD HEENT:  Atraumatic, normocephalic, oropharynx clear NECK:  Supple without lymphadenopathy or thyromegaly ABDOMEN:  Soft, non-tender, no masses EXTREMITIES:  Moves all extremities well, without clubbing, cyanosis, or edema NEUROLOGIC:  Alert and oriented x 3, normal gait, CN II-XII grossly intact MENTAL STATUS:  appropriate BACK:  Non-tender to palpation, No CVAT SKIN:  Warm, dry, and intact   Results: U/A:  CYSTOSCOPY/STENT REMOVAL  Pre-Operative Diagnosis:  left pyonephrosis  Post-Operative Diagnosis: left pyonephrosis  Anesthesia: local with lidocaine  gel  Surgical Narrative:  After appropriate informed consent was obtained, the patient was prepped and draped in the usual sterile fashion in the supine position. She was correctly identified and the proper procedure delineated prior to proceeding. Sterile lidocaine  gel was instilled in the urethra.  The flexible cystoscope was introduced without difficulty. The left ureteral stent was  identified and removed using grasping forceps.  She tolerated the procedure well.  A chaperone was present throughout the procedure.

## 2023-10-21 ENCOUNTER — Ambulatory Visit (INDEPENDENT_AMBULATORY_CARE_PROVIDER_SITE_OTHER): Admitting: Urology

## 2023-10-21 ENCOUNTER — Encounter: Payer: Self-pay | Admitting: Urology

## 2023-10-21 VITALS — BP 118/83 | HR 75 | Ht 63.0 in | Wt 259.0 lb

## 2023-10-21 DIAGNOSIS — K219 Gastro-esophageal reflux disease without esophagitis: Secondary | ICD-10-CM | POA: Diagnosis not present

## 2023-10-21 DIAGNOSIS — Z7985 Long-term (current) use of injectable non-insulin antidiabetic drugs: Secondary | ICD-10-CM | POA: Diagnosis not present

## 2023-10-21 DIAGNOSIS — R8290 Unspecified abnormal findings in urine: Secondary | ICD-10-CM | POA: Diagnosis not present

## 2023-10-21 DIAGNOSIS — N151 Renal and perinephric abscess: Secondary | ICD-10-CM | POA: Diagnosis not present

## 2023-10-21 DIAGNOSIS — N179 Acute kidney failure, unspecified: Secondary | ICD-10-CM | POA: Diagnosis not present

## 2023-10-21 DIAGNOSIS — M1990 Unspecified osteoarthritis, unspecified site: Secondary | ICD-10-CM | POA: Diagnosis not present

## 2023-10-21 DIAGNOSIS — M545 Low back pain, unspecified: Secondary | ICD-10-CM | POA: Diagnosis not present

## 2023-10-21 DIAGNOSIS — N136 Pyonephrosis: Secondary | ICD-10-CM | POA: Diagnosis not present

## 2023-10-21 DIAGNOSIS — G8929 Other chronic pain: Secondary | ICD-10-CM | POA: Diagnosis not present

## 2023-10-21 DIAGNOSIS — Z960 Presence of urogenital implants: Secondary | ICD-10-CM | POA: Diagnosis not present

## 2023-10-21 DIAGNOSIS — Z604 Social exclusion and rejection: Secondary | ICD-10-CM | POA: Diagnosis not present

## 2023-10-21 DIAGNOSIS — G629 Polyneuropathy, unspecified: Secondary | ICD-10-CM | POA: Diagnosis not present

## 2023-10-21 DIAGNOSIS — G4733 Obstructive sleep apnea (adult) (pediatric): Secondary | ICD-10-CM | POA: Diagnosis not present

## 2023-10-21 DIAGNOSIS — I10 Essential (primary) hypertension: Secondary | ICD-10-CM | POA: Diagnosis not present

## 2023-10-21 DIAGNOSIS — N16 Renal tubulo-interstitial disorders in diseases classified elsewhere: Secondary | ICD-10-CM | POA: Diagnosis not present

## 2023-10-21 DIAGNOSIS — Z87442 Personal history of urinary calculi: Secondary | ICD-10-CM | POA: Diagnosis not present

## 2023-10-21 LAB — URINALYSIS, ROUTINE W REFLEX MICROSCOPIC
Bilirubin, UA: NEGATIVE
Glucose, UA: NEGATIVE
Ketones, UA: NEGATIVE
Nitrite, UA: NEGATIVE
Specific Gravity, UA: 1.015 (ref 1.005–1.030)
Urobilinogen, Ur: 1 mg/dL (ref 0.2–1.0)
pH, UA: 6 (ref 5.0–7.5)

## 2023-10-21 LAB — MICROSCOPIC EXAMINATION
WBC, UA: >30 /HPF — AB (ref 0–5)
Yeast, UA: POSITIVE — AB

## 2023-10-21 NOTE — Progress Notes (Signed)
 Assessment: 1. Pyonephrosis. left   2. Abnormal urine findings     Plan: Given abnormal urinalysis today suggestive of possible ongoing UTI, will hold on stent removal. Resolve MDX urine culture sent today. Continue cefadroxil  pending culture results. Will contact her with results of the culture and to arrange appointment for stent removal.  Chief Complaint: Chief Complaint  Patient presents with   Cysto Stent Removal    HPI: Hailey Vance is a 58 y.o. female who presents for continued evaluation of left pyelonephrosis. She was seen in consultation at [facility] on 09/29/2023 for evaluation of left hydronephrosis.  She presented to the emergency room with a 3-day history of left flank pain.  She also had dysuria.  No gross hematuria.  She was found to have leukocytosis with a white count of 17.2 K.  Urinalysis was nitrite positive with >50 WBCs, many bacteria.  Creatinine elevated at 2.04.  CT renal stone study showed left hydronephrosis with air in the left collecting system consistent with pyelonephrosis, left renal cyst, and left renal calculi.  She was started on IV Rocephin .  She was taken to the operating room on 09/29/2023 for cystoscopy, left retrograde pyelogram, and insertion of left ureteral stent.  She was found to have purulent material draining from the left UO with dilation of the left renal pelvis and calyces and purulent drainage from the left kidney following stent placement. Urine culture grew >100 K E. coli.  Blood cultures were negative. Her creatinine improved to 1.02. She was discharged with 2 weeks of cefadroxil .  She presents today for possible cystoscopy and stent removal. She has had some stent related discomfort.  No recent gross hematuria.  She does report some left-sided flank pain last night.  She was having some itching and diarrhea associated with the cefadroxil  so she has been taking 500 mg twice daily.  No fever or chills.  Portions of the above  documentation were copied from a prior visit for review purposes only.  Allergies: Allergies  Allergen Reactions   Penicillins Itching and Other (See Comments)    Has patient had a PCN reaction causing immediate rash, facial/tongue/throat swelling, SOB or lightheadedness with hypotension: No Has patient had a PCN reaction causing severe rash involving mucus membranes or skin necrosis: No Has patient had a PCN reaction that required hospitalization No Has patient had a PCN reaction occurring within the last 10 years: No If all of the above answers are NO, then may proceed with Cephalosporin use.     PMH: Past Medical History:  Diagnosis Date   Bilateral primary osteoarthritis of knee 03/29/2019   Chronic back pain    Cystocele with rectocele    Generalized anxiety disorder    GERD (gastroesophageal reflux disease)    Headache    otc med prn   Heart murmur    echo in epic 11-26-2017  normal w/ ef 55-60%   History of chest pain    pt referred for atyical chest pain , cardiology evaluation w/ dr berry note in epic 10-25-2014, stated with low risk w/ no ischemia , nuclear ef 70% , done 09-19-2014 in epic,  non-cardiac chest pain   HTN (hypertension)    followed by pcp    (normal nuclear stress test 09-19-2014 in epic)   Low blood potassium    Major depressive disorder    OSA (obstructive sleep apnea)    04/2015 study in e- Severe OSA (AHI = 55.0/hour);   (10-06-2019  per pt  has not used cpap since 2019 after gastric bypass)   Osteoarthritis of both hips 06/15/2019   Plantar fasciitis of left foot    S/P gastric bypass 02/15/2018   sleeve   SUI (stress urinary incontinence, female)    Vitamin D  deficiency 09/06/2014   Wears glasses     PSH: Past Surgical History:  Procedure Laterality Date   BLADDER SUSPENSION N/A 10/12/2019   Procedure: TRANSVAGINAL TAPE (TVT) PROCEDURE;  Surgeon: Sarrah Browning, MD;  Location: [facility];  Service: Gynecology;  Laterality:  N/A;   CESAREAN SECTION W/BTL  1990   COLONOSCOPY WITH PROPOFOL  N/A 08/30/2015   Procedure: COLONOSCOPY WITH PROPOFOL ;  Surgeon: Gustav LULLA Mcgee, MD;  Location: [facility];  Service: Endoscopy;  Laterality: N/A;   CYSTOCELE REPAIR N/A 10/12/2019   Procedure: ANTERIOR (CYSTOCELE);  Surgeon: Sarrah Browning, MD;  Location: [facility];  Service: Gynecology;  Laterality: N/A;   CYSTOSCOPY N/A 05/23/2015   Procedure: CYSTOSCOPY;  Surgeon: Browning Sarrah, MD;  Location: [facility];  Service: Gynecology;  Laterality: N/A;   CYSTOSCOPY N/A 10/12/2019   Procedure: CYSTOSCOPY;  Surgeon: Sarrah Browning, MD;  Location: [facility];  Service: Gynecology;  Laterality: N/A;   CYSTOSCOPY WITH RETROGRADE PYELOGRAM, URETEROSCOPY AND STENT PLACEMENT Left 09/29/2023   Procedure: CYSTOSCOPY WITH RETROGRADE PYELOGRAM AND STENT INSERTION;  Surgeon: Roseann Adine PARAS., MD;  Location: [facility];  Service: Urology;  Laterality: Left;   HIATAL HERNIA REPAIR N/A 02/15/2018   Procedure: HERNIA REPAIR HIATAL;  Surgeon: Mikell Katz, MD;  Location: [facility];  Service: General;  Laterality: N/A;   HYSTEROSCOPY WITH D & C N/A 01/11/2015   Procedure: DILATATION AND CURETTAGE /HYSTEROSCOPY;  Surgeon: Jolene Gaskins, MD;  Location: [facility];  Service: Gynecology;  Laterality: N/A;   KNEE ARTHROSCOPY Right 2010   LAPAROSCOPIC GASTRIC SLEEVE RESECTION N/A 02/15/2018   Procedure: LAPAROSCOPIC GASTRIC SLEEVE RESECTION WITH UPPER ENDO AND ERAS PATHWAY;  Surgeon: Mikell Katz, MD;  Location: [facility];  Service: General;  Laterality: N/A;   LAPAROSCOPIC OOPHORECTOMY Bilateral 2004   LEFT OOPHORECTOMY AND PARTIAL RIGHT OOPHORECTOMY   LIPOMA EXCISION Left 12/02/2016   Procedure: EXCISION LIPOMA OF LEFT SHOULDER;  Surgeon: Anderson Maude ORN, MD;  Location: [facility];  Service: Orthopedics;  Laterality: Left;   ROBOTIC ASSISTED TOTAL HYSTERECTOMY WITH SALPINGECTOMY Bilateral 05/23/2015   Procedure: ROBOTIC  ASSISTED TOTAL HYSTERECTOMY WITH SALPINGECTOMYwith right oophorectomy;  Surgeon: Browning Sarrah, MD;  Location: [facility];  Service: Gynecology;  Laterality: Bilateral;   UMBILICAL HERNIA REPAIR  2002    SH: Social History   Tobacco Use   Smoking status: Never   Smokeless tobacco: Never  Vaping Use   Vaping status: Never Used  Substance Use Topics   Alcohol use: No    Alcohol/week: 0.0 standard drinks of alcohol   Drug use: Never    ROS: Constitutional:  Negative for fever, chills, weight loss CV: Negative for chest pain, previous MI, hypertension Respiratory:  Negative for shortness of breath, wheezing, sleep apnea, frequent cough GI:  Negative for nausea, vomiting, bloody stool, GERD  PE: BP 118/83   Pulse 75   Ht 5' 3 (1.6 m)   Wt 259 lb (117.5 kg)   LMP 10/02/2007   BMI 45.88 kg/m  GENERAL APPEARANCE:  Well appearing, well developed, well nourished, NAD HEENT:  Atraumatic, normocephalic, oropharynx clear NECK:  Supple without lymphadenopathy or thyromegaly ABDOMEN:  Soft, non-tender, no masses EXTREMITIES:  Moves all extremities well, without clubbing, cyanosis, or edema  NEUROLOGIC:  Alert and oriented x 3, normal gait, CN II-XII grossly intact MENTAL STATUS:  appropriate BACK:  Non-tender to palpation, No CVAT SKIN:  Warm, dry, and intact   Results: U/A: >30 WBCs, 3-10 RBCs, many bacteria, nitrite negative

## 2023-10-22 DIAGNOSIS — M1990 Unspecified osteoarthritis, unspecified site: Secondary | ICD-10-CM | POA: Diagnosis not present

## 2023-10-22 DIAGNOSIS — Z604 Social exclusion and rejection: Secondary | ICD-10-CM | POA: Diagnosis not present

## 2023-10-22 DIAGNOSIS — G629 Polyneuropathy, unspecified: Secondary | ICD-10-CM | POA: Diagnosis not present

## 2023-10-22 DIAGNOSIS — N151 Renal and perinephric abscess: Secondary | ICD-10-CM | POA: Diagnosis not present

## 2023-10-22 DIAGNOSIS — Z960 Presence of urogenital implants: Secondary | ICD-10-CM | POA: Diagnosis not present

## 2023-10-22 DIAGNOSIS — Z87442 Personal history of urinary calculi: Secondary | ICD-10-CM | POA: Diagnosis not present

## 2023-10-22 DIAGNOSIS — G4733 Obstructive sleep apnea (adult) (pediatric): Secondary | ICD-10-CM | POA: Diagnosis not present

## 2023-10-22 DIAGNOSIS — G8929 Other chronic pain: Secondary | ICD-10-CM | POA: Diagnosis not present

## 2023-10-22 DIAGNOSIS — Z7985 Long-term (current) use of injectable non-insulin antidiabetic drugs: Secondary | ICD-10-CM | POA: Diagnosis not present

## 2023-10-22 DIAGNOSIS — M545 Low back pain, unspecified: Secondary | ICD-10-CM | POA: Diagnosis not present

## 2023-10-22 DIAGNOSIS — K219 Gastro-esophageal reflux disease without esophagitis: Secondary | ICD-10-CM | POA: Diagnosis not present

## 2023-10-22 DIAGNOSIS — N179 Acute kidney failure, unspecified: Secondary | ICD-10-CM | POA: Diagnosis not present

## 2023-10-22 DIAGNOSIS — I10 Essential (primary) hypertension: Secondary | ICD-10-CM | POA: Diagnosis not present

## 2023-10-22 DIAGNOSIS — N136 Pyonephrosis: Secondary | ICD-10-CM | POA: Diagnosis not present

## 2023-10-26 ENCOUNTER — Other Ambulatory Visit: Payer: Self-pay | Admitting: Urology

## 2023-10-26 ENCOUNTER — Telehealth: Payer: Self-pay

## 2023-10-26 ENCOUNTER — Telehealth: Payer: Self-pay | Admitting: Urology

## 2023-10-26 ENCOUNTER — Encounter: Payer: Self-pay | Admitting: Urology

## 2023-10-26 MED ORDER — CIPROFLOXACIN HCL 500 MG PO TABS: 500.0000 mg | ORAL_TABLET | Freq: Two times a day (BID) | ORAL | 0 refills | Status: AC

## 2023-10-26 NOTE — Telephone Encounter (Signed)
 Spoke with Cardiology today about referral for the patient, They have contacted the patient and left a voicemail and she hasn't called back.  They are going to try again today to contact the patient.   Dx: Dyspnea on Exertion and Peripheral edema  Explained to cardiology that we need clearance before her Endoscopy  which is scheduled on 10/3 and she is ok to see a PA  because they are booked so far out.

## 2023-10-26 NOTE — Telephone Encounter (Signed)
-----   Message from Home sent at 10/26/2023  1:00 PM EDT ----- Please notify patient to begin Cipro  500 mg twice daily x 7 days for continued treatment of UTI. She needs a follow-up appointment early next week on Tuesday or Wednesday for cystoscopy and stent removal.  Overbook if needed.

## 2023-10-26 NOTE — Telephone Encounter (Signed)
LMOM asking pt to return call.  

## 2023-10-26 NOTE — Telephone Encounter (Signed)
 Patient wanted someone to go over Urine results with her. Said if it was a message through [employer] that was ok.

## 2023-10-26 NOTE — Telephone Encounter (Signed)
 Second attempt to reach pt unsuccessful. Left another message on VM asking pt to return call.

## 2023-10-27 NOTE — Telephone Encounter (Signed)
 Sent pt a [employer] which has been read. See separate encounter.

## 2023-10-29 DIAGNOSIS — N16 Renal tubulo-interstitial disorders in diseases classified elsewhere: Secondary | ICD-10-CM | POA: Diagnosis not present

## 2023-10-29 DIAGNOSIS — G629 Polyneuropathy, unspecified: Secondary | ICD-10-CM | POA: Diagnosis not present

## 2023-10-29 DIAGNOSIS — N136 Pyonephrosis: Secondary | ICD-10-CM | POA: Diagnosis not present

## 2023-10-29 DIAGNOSIS — Z87442 Personal history of urinary calculi: Secondary | ICD-10-CM | POA: Diagnosis not present

## 2023-10-29 DIAGNOSIS — N151 Renal and perinephric abscess: Secondary | ICD-10-CM | POA: Diagnosis not present

## 2023-10-29 DIAGNOSIS — M545 Low back pain, unspecified: Secondary | ICD-10-CM | POA: Diagnosis not present

## 2023-10-29 DIAGNOSIS — Z7985 Long-term (current) use of injectable non-insulin antidiabetic drugs: Secondary | ICD-10-CM | POA: Diagnosis not present

## 2023-10-29 DIAGNOSIS — K219 Gastro-esophageal reflux disease without esophagitis: Secondary | ICD-10-CM | POA: Diagnosis not present

## 2023-10-29 DIAGNOSIS — Z604 Social exclusion and rejection: Secondary | ICD-10-CM | POA: Diagnosis not present

## 2023-10-29 DIAGNOSIS — M1990 Unspecified osteoarthritis, unspecified site: Secondary | ICD-10-CM | POA: Diagnosis not present

## 2023-10-29 DIAGNOSIS — Z960 Presence of urogenital implants: Secondary | ICD-10-CM | POA: Diagnosis not present

## 2023-10-29 DIAGNOSIS — I10 Essential (primary) hypertension: Secondary | ICD-10-CM | POA: Diagnosis not present

## 2023-10-29 DIAGNOSIS — G8929 Other chronic pain: Secondary | ICD-10-CM | POA: Diagnosis not present

## 2023-10-29 DIAGNOSIS — N179 Acute kidney failure, unspecified: Secondary | ICD-10-CM | POA: Diagnosis not present

## 2023-11-04 ENCOUNTER — Ambulatory Visit: Admitting: Urology

## 2023-11-04 ENCOUNTER — Encounter: Payer: Self-pay | Admitting: Urology

## 2023-11-04 VITALS — BP 109/75 | HR 97

## 2023-11-04 DIAGNOSIS — B356 Tinea cruris: Secondary | ICD-10-CM

## 2023-11-04 DIAGNOSIS — N136 Pyonephrosis: Secondary | ICD-10-CM | POA: Diagnosis not present

## 2023-11-04 LAB — URINALYSIS, ROUTINE W REFLEX MICROSCOPIC
Bilirubin, UA: NEGATIVE
Glucose, UA: NEGATIVE
Ketones, UA: NEGATIVE
Nitrite, UA: NEGATIVE
Specific Gravity, UA: 1.025 (ref 1.005–1.030)
Urobilinogen, Ur: 0.2 mg/dL (ref 0.2–1.0)
pH, UA: 6.5 (ref 5.0–7.5)

## 2023-11-04 LAB — MICROSCOPIC EXAMINATION: RBC, Urine: >30 /HPF — AB (ref 0–2)

## 2023-11-04 MED ORDER — FLUCONAZOLE 100 MG PO TABS: 100.0000 mg | ORAL_TABLET | Freq: Every day | ORAL | 0 refills | Status: AC

## 2023-11-04 MED ORDER — NYSTATIN 100000 UNIT/GM EX CREA: 1.0000 | TOPICAL_CREAM | Freq: Two times a day (BID) | CUTANEOUS | 0 refills | Status: AC

## 2023-11-04 NOTE — Progress Notes (Signed)
 Assessment: 1. Pyonephrosis. left     Plan: Left ureteral stent removed today. Rx for fluconazole  100 mg daily x 3 days. Rx for nystatin  cream sent. Return to office in 1 month.  Chief Complaint: Chief Complaint  Patient presents with   Cysto Stent Removal    HPI: Hailey Vance is a 58 y.o. female who presents for continued evaluation of left pyelonephrosis. She was seen in consultation at [facility] on 09/29/2023 for evaluation of left hydronephrosis.  She presented to the emergency room with a 3-day history of left flank pain.  She also had dysuria.  No gross hematuria.  She was found to have leukocytosis with a white count of 17.2 K.  Urinalysis was nitrite positive with >50 WBCs, many bacteria.  Creatinine elevated at 2.04.  CT renal stone study showed left hydronephrosis with air in the left collecting system consistent with pyelonephrosis, left renal cyst, and left renal calculi.  She was started on IV Rocephin .  She was taken to the operating room on 09/29/2023 for cystoscopy, left retrograde pyelogram, and insertion of left ureteral stent.  She was found to have purulent material draining from the left UO with dilation of the left renal pelvis and calyces and purulent drainage from the left kidney following stent placement. Urine culture grew >100 K E. coli.  Blood cultures were negative. Her creatinine improved to 1.02. She was discharged with 2 weeks of cefadroxil .  She presented to the office on 10/21/23 for possible cystoscopy and stent removal. She had some stent related discomfort.  No recent gross hematuria.  She reported some left-sided flank pain.  She was having some itching and diarrhea associated with the cefadroxil  so she was taking 500 mg twice daily.  No fever or chills. Due to an abnormal urinalysis suggesting possible UTI, stent removal was postponed. Resolve Mdx urine culture grew E. Coli and Candida.   She was given a 7-day course of Cipro .  She presents  today for cystoscopy and stent removal.  Completed the Cipro  last night.  She is not having any fevers or chills.  She is having some frequency and urgency.  No dysuria or gross hematuria.  She does report some rash in the suprapubic area and groin area consistent with yeast infection.  Portions of the above documentation were copied from a prior visit for review purposes only.  Allergies: Allergies  Allergen Reactions   Penicillins Itching and Other (See Comments)    Has patient had a PCN reaction causing immediate rash, facial/tongue/throat swelling, SOB or lightheadedness with hypotension: No Has patient had a PCN reaction causing severe rash involving mucus membranes or skin necrosis: No Has patient had a PCN reaction that required hospitalization No Has patient had a PCN reaction occurring within the last 10 years: No If all of the above answers are NO, then may proceed with Cephalosporin use.     PMH: Past Medical History:  Diagnosis Date   Bilateral primary osteoarthritis of knee 03/29/2019   Chronic back pain    Cystocele with rectocele    Generalized anxiety disorder    GERD (gastroesophageal reflux disease)    Headache    otc med prn   Heart murmur    echo in epic 11-26-2017  normal w/ ef 55-60%   History of chest pain    pt referred for atyical chest pain , cardiology evaluation w/ dr berry note in epic 10-25-2014, stated with low risk w/ no ischemia , nuclear ef 70% ,  done 09-19-2014 in epic,  non-cardiac chest pain   HTN (hypertension)    followed by pcp    (normal nuclear stress test 09-19-2014 in epic)   Low blood potassium    Major depressive disorder    OSA (obstructive sleep apnea)    04/2015 study in e- Severe OSA (AHI = 55.0/hour);   (10-06-2019  per pt has not used cpap since 2019 after gastric bypass)   Osteoarthritis of both hips 06/15/2019   Plantar fasciitis of left foot    S/P gastric bypass 02/15/2018   sleeve   SUI (stress urinary incontinence,  female)    Vitamin D  deficiency 09/06/2014   Wears glasses     PSH: Past Surgical History:  Procedure Laterality Date   BLADDER SUSPENSION N/A 10/12/2019   Procedure: TRANSVAGINAL TAPE (TVT) PROCEDURE;  Surgeon: Sarrah Browning, MD;  Location: [facility];  Service: Gynecology;  Laterality: N/A;   CESAREAN SECTION W/BTL  1990   COLONOSCOPY WITH PROPOFOL  N/A 08/30/2015   Procedure: COLONOSCOPY WITH PROPOFOL ;  Surgeon: Gustav LULLA Mcgee, MD;  Location: [facility];  Service: Endoscopy;  Laterality: N/A;   CYSTOCELE REPAIR N/A 10/12/2019   Procedure: ANTERIOR (CYSTOCELE);  Surgeon: Sarrah Browning, MD;  Location: [facility];  Service: Gynecology;  Laterality: N/A;   CYSTOSCOPY N/A 05/23/2015   Procedure: CYSTOSCOPY;  Surgeon: Browning Sarrah, MD;  Location: [facility];  Service: Gynecology;  Laterality: N/A;   CYSTOSCOPY N/A 10/12/2019   Procedure: CYSTOSCOPY;  Surgeon: Sarrah Browning, MD;  Location[facility];  Service: Gynecology;  Laterality: N/A;   CYSTOSCOPY WITH RETROGRADE PYELOGRAM, URETEROSCOPY AND STENT PLACEMENT Left 09/29/2023   Procedure: CYSTOSCOPY WITH RETROGRADE PYELOGRAM AND STENT INSERTION;  Surgeon: Roseann Adine PARAS., MD;  Location: [facility];  Service: Urology;  Laterality: Left;   HIATAL HERNIA REPAIR N/A 02/15/2018   Procedure: HERNIA REPAIR HIATAL;  Surgeon: Mikell Katz, MD;  Location: [facility];  Service: General;  Laterality: N/A;   HYSTEROSCOPY WITH D & C N/A 01/11/2015   Procedure: DILATATION AND CURETTAGE /HYSTEROSCOPY;  Surgeon: Jolene Gaskins, MD;  Location: [facility];  Service: Gynecology;  Laterality: N/A;   KNEE ARTHROSCOPY Right 2010   LAPAROSCOPIC GASTRIC SLEEVE RESECTION N/A 02/15/2018   Procedure: LAPAROSCOPIC GASTRIC SLEEVE RESECTION WITH UPPER ENDO AND ERAS PATHWAY;  Surgeon: Mikell Katz, MD;  Location: [facility];  Service: General;  Laterality: N/A;   LAPAROSCOPIC OOPHORECTOMY Bilateral 2004   LEFT  OOPHORECTOMY AND PARTIAL RIGHT OOPHORECTOMY   LIPOMA EXCISION Left 12/02/2016   Procedure: EXCISION LIPOMA OF LEFT SHOULDER;  Surgeon: Anderson Maude ORN, MD;  Location: [facility];  Service: Orthopedics;  Laterality: Left;   ROBOTIC ASSISTED TOTAL HYSTERECTOMY WITH SALPINGECTOMY Bilateral 05/23/2015   Procedure: ROBOTIC ASSISTED TOTAL HYSTERECTOMY WITH SALPINGECTOMYwith right oophorectomy;  Surgeon: Browning Sarrah, MD;  Location: [facility];  Service: Gynecology;  Laterality: Bilateral;   UMBILICAL HERNIA REPAIR  2002    SH: Social History   Tobacco Use   Smoking status: Never   Smokeless tobacco: Never  Vaping Use   Vaping status: Never Used  Substance Use Topics   Alcohol use: No    Alcohol/week: 0.0 standard drinks of alcohol   Drug use: Never    ROS: Constitutional:  Negative for fever, chills, weight loss CV: Negative for chest pain, previous MI, hypertension Respiratory:  Negative for shortness of breath, wheezing, sleep apnea, frequent cough GI:  Negative for nausea, vomiting, bloody stool, GERD  PE: BP 109/75   Pulse 97  LMP 10/02/2007  GENERAL APPEARANCE:  Well appearing, well developed, well nourished, NAD HEENT:  Atraumatic, normocephalic, oropharynx clear NECK:  Supple without lymphadenopathy or thyromegaly ABDOMEN:  Soft, non-tender, no masses EXTREMITIES:  Moves all extremities well, without clubbing, cyanosis, or edema NEUROLOGIC:  Alert and oriented x 3, normal gait, CN II-XII grossly intact MENTAL STATUS:  appropriate BACK:  Non-tender to palpation, No CVAT SKIN:  Warm, dry, and intact   Results: U/A: 6-10 WBC, >30 RBC, mod bacteria  CYSTOSCOPY/STENT REMOVAL  Pre-Operative Diagnosis:  left pyonephrosis  Post-Operative Diagnosis: Left pyonephrosis  Anesthesia: local with lidocaine  gel  Surgical Narrative:  After appropriate informed consent was obtained, the patient was prepped and draped in the usual sterile fashion in the supine position. She was  correctly identified and the proper procedure delineated prior to proceeding. Sterile lidocaine  gel was instilled in the urethra.  The flexible cystoscope was introduced without difficulty. The left ureteral stent was removed using grasping forceps. She tolerated the procedure well.  A chaperone was present throughout the procedure.

## 2023-11-11 ENCOUNTER — Ambulatory Visit: Attending: Physician Assistant | Admitting: Physician Assistant

## 2023-11-11 ENCOUNTER — Encounter: Payer: Self-pay | Admitting: Physician Assistant

## 2023-11-11 VITALS — BP 118/82 | HR 85 | Ht 63.0 in | Wt 248.8 lb

## 2023-11-11 DIAGNOSIS — R0609 Other forms of dyspnea: Secondary | ICD-10-CM

## 2023-11-11 DIAGNOSIS — R1310 Dysphagia, unspecified: Secondary | ICD-10-CM

## 2023-11-11 DIAGNOSIS — E66813 Obesity, class 3: Secondary | ICD-10-CM | POA: Diagnosis not present

## 2023-11-11 DIAGNOSIS — I10 Essential (primary) hypertension: Secondary | ICD-10-CM

## 2023-11-11 NOTE — Progress Notes (Signed)
 Cardiology Office Note   Date:  11/11/2023  ID:  Hailey Vance, DOB 11-21-1965, MRN [id number] PCP: Jerome Heron Ruth, PA-C  [facility] Providers Cardiologist:  [facility] - DOD Dr. Verlin  History of Present Illness Bobbie Virden is a 58 y.o. female with PMH of morbid obesity, hypertension, OSA on CPAP, and history of gastric bypass surgery.  Patient was seen by Dr. Wadie in 2016 for evaluation of chest discomfort.  Stress test performed on 09/19/2014 showed EF 70%, moderate size mild intensity fixed anterior defect consistent with soft tissue attenuation artifact, no ischemia.  Echocardiogram obtained on 11/26/2017 showed EF 55 to 60%, no regional wall motion abnormality, trace TR. She has chronic bilateral knee pain due to severe osteoarthritis followed by Dr. Jerri of orthopedic service.  Patient was admitted in July 2025 due to left flank pain and was diagnosed with UTI with positive nitrite.  CT showed left hydronephrosis.  She subsequently underwent cystoscopy and left retrograde pyelogram with insertion of the left ureteral stent by Dr. Roseann of urology service on 09/29/2023.  During the hospitalization, creatinine went up to 2.04 before trending back down to 1.02 after hydration.  Patient was recently seen by GI service on 10/13/2023 for evaluation of dysphagia.  Patient also complained of dyspnea and peripheral edema.  She is anticipated to have endoscopy procedure and require cardiac evaluation for shortness of breath and lower extremity edema prior to that.  [facility] for evaluation of dyspnea on exertion.  Dyspnea on exertion has been going on for longer than a year.  She denies any chest discomfort in the recent years.  Family says she can walk roughly 30 yards before she get out of breath.  She does have morbid obesity and significant osteoarthritis in both knees.  She reliant on a rolling walker to get around.  She is also quite  sedentary and does not do any strenuous activity.  EKG showed no significant ST-T wave changes.  I did not see any pitting edema in the lower extremity however she does have a lot of fatty tissue in her leg.  She says her leg edema only appears near the end of the day after she has been sitting or standing for long period of time.  I suspect that her leg edema is more related to venous insufficiency rather than heart failure.  She denies any orthopnea or PND.  I recommended echocardiogram to make sure her EF is normal.  If echocardiogram is normal, I would not recommend any further cardiac workup and the patient may proceed with endoscopy procedure from the cardiac perspective.  She has been instructed to contact cardiology service if her dyspnea on exertion significantly worsens or she start developing any chest discomfort.  ROS:   Patient complains of leg edema and shortness of breath with exertion.  She denies any chest pain.  She has no orthopnea or PND.  Studies Reviewed EKG Interpretation Date/Time:  Wednesday November 11 2023 10:26:29 EDT Ventricular Rate:  84 PR Interval:  120 QRS Duration:  72 QT Interval:  374 QTC Calculation: 441 R Axis:   1  Text Interpretation: Normal sinus rhythm No significant ST-T wave changes When compared with ECG of 11-Nov-2023 10:14, No significant change was found Confirmed by Janene Boer [phone]) on 11/11/2023 10:29:06 AM    Cardiac Studies & Procedures   ______________________________________________________________________________________________   STRESS TESTS  MYOCARDIAL PERFUSION IMAGING 09/19/2014  Interpretation Summary  The left ventricular ejection fraction  is hyperdynamic (>65%).  Nuclear stress EF: 70%.  There was no ST segment deviation noted during stress.  This is a low risk study.  Low risk stress nuclear study with a moderate size, mild intensity, fixed anterior defect consistent with soft tissue attenuation; no ischemia; EF 70  and normal wall motion.   ECHOCARDIOGRAM  ECHOCARDIOGRAM COMPLETE 11/26/2017  Narrative *[facility]* [address] [place], [place] [address] [phone]  ------------------------------------------------------------------- Echocardiography  Patient:    Arieliz, Latino MR #:       [id number] Study Date: 11/26/2017 Gender:     F Age:        51 Height:     160 cm Weight:     163.9 kg BSA:        2.82 m^2 Pt. Status: Room:  ATTENDING    O&'Sullivan, [name] [id number] [facility]     O&'Sullivan, [name] [id number] REFERRING    O&'Sullivan, Eleanor [id number] PERFORMING   [facility] [place] SONOGRAPHER  Fairy Canton, RDCS  cc:  ------------------------------------------------------------------- LV EF: 55% -   60%  ------------------------------------------------------------------- Indications:      Murmur 785.2.  ------------------------------------------------------------------- History:   Risk factors:  Hypertension.  ------------------------------------------------------------------- Study Conclusions  - Left ventricle: The cavity size was normal. Systolic function was normal. The estimated ejection fraction was in the range of 55% to 60%. Wall motion was normal; there were no regional wall motion abnormalities. Left ventricular diastolic function parameters were normal. - Aortic valve: Valve area (Vmax): 2.02 cm^2. - Pulmonary arteries: PA peak pressure: 37 mm Hg (S).  Impressions:  - Normal LVEF. Trace TR.  ------------------------------------------------------------------- Study data:  No prior study was available for comparison.  Study status:  Routine.  Procedure:  The patient reported no pain pre or post test. Transthoracic echocardiography. Image quality was adequate.  Study completion:  There were no complications. Echocardiography.  M-mode, complete 2D, spectral Doppler, and color Doppler.  Birthdate:  Patient birthdate: [birth year].  Age:   Patient is 58 yr old.  Sex:  Gender: female.    BMI: 64 kg/m^2.  Blood pressure:     150/100  Patient status:  Outpatient.  Study date: Study date: 11/26/2017. Study time: 12:31 PM.  Location:  Echo laboratory.  -------------------------------------------------------------------  ------------------------------------------------------------------- Left ventricle:  The cavity size was normal. Systolic function was normal. The estimated ejection fraction was in the range of 55% to 60%. Wall motion was normal; there were no regional wall motion abnormalities. The transmitral flow pattern was normal. The deceleration time of the early transmitral flow velocity was normal. The pulmonary vein flow pattern was normal. The tissue Doppler parameters were normal. Left ventricular diastolic function parameters were normal.  ------------------------------------------------------------------- Aortic valve:   Trileaflet; normal thickness leaflets. Mobility was not restricted.  Doppler:  Transvalvular velocity was within the normal range. There was no stenosis. There was no regurgitation. Peak velocity ratio of LVOT to aortic valve: 0.64. Valve area (Vmax): 2.02 cm^2. Indexed valve area (Vmax): 0.72 cm^2/m^2. Peak gradient (S): 14 mm Hg.  ------------------------------------------------------------------- Aorta:  Aortic root: The aortic root was normal in size. Ascending aorta: The ascending aorta was mildly dilated.  ------------------------------------------------------------------- Mitral valve:   Structurally normal valve.   Mobility was not restricted.  Doppler:  Transvalvular velocity was within the normal range. There was no evidence for stenosis. There was trivial regurgitation.    Peak gradient (D): 5 mm Hg.  ------------------------------------------------------------------- Left atrium:  The atrium was normal in  size.  ------------------------------------------------------------------- Right ventricle:  The cavity size was normal. Wall thickness was normal. Systolic function was normal.  ------------------------------------------------------------------- Pulmonic valve:    Doppler:  Transvalvular velocity was within the normal range. There was no evidence for stenosis.  ------------------------------------------------------------------- Tricuspid valve:   Structurally normal valve.    Doppler: Transvalvular velocity was within the normal range. There was no regurgitation.  ------------------------------------------------------------------- Pulmonary artery:   The main pulmonary artery was normal-sized. Systolic pressure was within the normal range.  ------------------------------------------------------------------- Right atrium:  The atrium was normal in size.  ------------------------------------------------------------------- Pericardium:  There was no pericardial effusion.  ------------------------------------------------------------------- Systemic veins: Inferior vena cava: The vessel was normal in size.  ------------------------------------------------------------------- Measurements  Left ventricle                           Value          Reference LV ID, ED, PLAX chordal           (L)    38.3  mm       43 - 52 LV ID, ES, PLAX chordal           (L)    22.6  mm       23 - 38 LV fx shortening, PLAX chordal           41    %        >=29 LV PW thickness, ED                      11    mm       ---------- IVS/LV PW ratio, ED                      1.02           <=1.3 Stroke volume, 2D                        78    ml       ---------- Stroke volume/bsa, 2D                    28    ml/m^2   ---------- LV e&', lateral                           11.2  cm/s     ---------- LV E/e&', lateral                         9.73           ---------- LV e&', medial                            13.4   cm/s     ---------- LV E/e&', medial                          8.13           ---------- LV e&', average                           12.3  cm/s     ---------- LV E/e&', average  8.86           ---------- Longitudinal strain, TDI                 18    %        ----------  Ventricular septum                       Value          Reference IVS thickness, ED                        11.2  mm       ----------  LVOT                                     Value          Reference LVOT ID, S                               20    mm       ---------- LVOT area                                3.14  cm^2     ---------- LVOT peak velocity, S                    122   cm/s     ---------- LVOT mean velocity, S                    83.6  cm/s     ---------- LVOT VTI, S                              24.7  cm       ---------- LVOT peak gradient, S                    6     mm Hg    ----------  Aortic valve                             Value          Reference Aortic valve peak velocity, S            190   cm/s     ---------- Aortic peak gradient, S                  14    mm Hg    ---------- Velocity ratio, peak, LVOT/AV            0.64           ---------- Aortic valve area, peak velocity         2.02  cm^2     ---------- Aortic valve area/bsa, peak              0.72  cm^2/m^2 ---------- velocity  Aorta                                    Value  Reference Aortic root ID, ED                       25    mm       ---------- Ascending aorta ID, A-P, S               38    mm       ----------  Left atrium                              Value          Reference LA ID, A-P, ES                           28    mm       ---------- LA ID/bsa, A-P                           0.99  cm/m^2   <=2.2 LA volume, S                             31.8  ml       ---------- LA volume/bsa, S                         11.3  ml/m^2   ---------- LA volume, ES, 1-p A4C                   34.1  ml       ---------- LA  volume/bsa, ES, 1-p A4C               12.1  ml/m^2   ---------- LA volume, ES, 1-p A2C                   24.9  ml       ---------- LA volume/bsa, ES, 1-p A2C               8.8   ml/m^2   ----------  Mitral valve                             Value          Reference Mitral E-wave peak velocity              109   cm/s     ---------- Mitral A-wave peak velocity              114   cm/s     ---------- Mitral deceleration time          (H)    254   ms       150 - 230 Mitral peak gradient, D                  5     mm Hg    ---------- Mitral E/A ratio, peak                   0.9            ----------  Pulmonary arteries                       Value  Reference PA pressure, S, DP                (H)    37    mm Hg    <=30  Tricuspid valve                          Value          Reference Tricuspid regurg peak velocity           291   cm/s     ---------- Tricuspid peak RV-RA gradient            34    mm Hg    ----------  Right atrium                             Value          Reference RA ID, S-I, ES, A4C                      41.8  mm       34 - 49 RA area, ES, A4C                         11.6  cm^2     8.3 - 19.5 RA volume, ES, A/L                       26.3  ml       ---------- RA volume/bsa, ES, A/L                   9.3   ml/m^2   ----------  Systemic veins                           Value          Reference Estimated CVP                            3     mm Hg    ----------  Right ventricle                          Value          Reference RV ID, minor axis, ED, A4C base          32.9  mm       ---------- TAPSE                                    16.9  mm       ---------- RV pressure, S, DP                (H)    37    mm Hg    <=30 RV s&', lateral, S                        14.9  cm/s     ----------  Legend: (L)  and  (H)  mark values outside specified reference range.  ------------------------------------------------------------------- Prepared and Electronically Authenticated  by  [name] Fitch, MD 2019-09-27T12:47:16  *[facility]* [address]  [place], [place] [address] [phone]  ------------------------------------------------------------------- Echocardiography  (Report amended )  Patient:    Bergen, Melle MR #:       [id number] Study Date: 11/26/2017 Gender:     F Age:        15 Height:     160 cm Weight:     163.9 kg BSA:        2.82 m^2 Pt. Status: Room:  ATTENDING    O&'Sullivan, [name] [id number] [facility]     O&'Sullivan, [name] [id number] REFERRING    O&'Sullivan, Eleanor [phone] PERFORMING   [facility] [place] SONOGRAPHER  Fairy Canton, RDCS  cc:  ------------------------------------------------------------------- LV EF: 55% -   60%  ------------------------------------------------------------------- Indications:      Murmur 785.2.  ------------------------------------------------------------------- History:   Risk factors:  Hypertension.  ------------------------------------------------------------------- Study Conclusions  - Left ventricle: The cavity size was normal. Systolic function was normal. The estimated ejection fraction was in the range of 55% to 60%. Wall motion was normal; there were no regional wall motion abnormalities. Left ventricular diastolic function parameters were normal. - Aortic valve: Valve area (Vmax): 2.02 cm^2. - Pulmonary arteries: PA peak pressure: 37 mm Hg (S).  Impressions:  - Normal LVEF. Trace TR. Ascending aortae enlargement - 3.8 cm  ------------------------------------------------------------------- Study data:  No prior study was available for comparison.  Study status:  Routine.  Procedure:  The patient reported no pain pre or post test. Transthoracic echocardiography. Image quality was adequate.  Study completion:  There were no complications. Echocardiography.  M-mode, complete 2D, spectral Doppler, and color Doppler.  Birthdate:  Patient birthdate:  May 23, 1965.  Age:  Patient is 58 yr old.  Sex:  Gender: female.    BMI: 64 kg/m^2.  Blood pressure:     150/100  Patient status:  Outpatient.  Study date: Study date: 11/26/2017. Study time: 12:31 PM.  Location:  Echo laboratory.  -------------------------------------------------------------------  ------------------------------------------------------------------- Left ventricle:  The cavity size was normal. Systolic function was normal. The estimated ejection fraction was in the range of 55% to 60%. Wall motion was normal; there were no regional wall motion abnormalities. The transmitral flow pattern was normal. The deceleration time of the early transmitral flow velocity was normal. The pulmonary vein flow pattern was normal. The tissue Doppler parameters were normal. Left ventricular diastolic function parameters were normal.  ------------------------------------------------------------------- Aortic valve:   Trileaflet; normal thickness leaflets. Mobility was not restricted.  Doppler:  Transvalvular velocity was within the normal range. There was no stenosis. There was no regurgitation. Peak velocity ratio of LVOT to aortic valve: 0.64. Valve area (Vmax): 2.02 cm^2. Indexed valve area (Vmax): 0.72 cm^2/m^2. Peak gradient (S): 14 mm Hg.  [Narrative TRUNCATED]          ______________________________________________________________________________________________      Risk Assessment/Calculations           Physical Exam VS:  BP 118/82   Pulse 85   Ht 5' 3 (1.6 m)   Wt 248 lb 12.8 oz (112.9 kg)   LMP 10/02/2007   SpO2 96%   BMI 44.07 kg/m        Wt Readings from Last 3 Encounters:  11/11/23 248 lb 12.8 oz (112.9 kg)  10/21/23 259 lb (117.5 kg)  10/13/23 259 lb (117.5 kg)    GEN: Well nourished, well developed in no acute distress NECK: No JVD; No carotid bruits CARDIAC: RRR, no murmurs, rubs, gallops RESPIRATORY:  Clear to auscultation without rales,  wheezing or rhonchi  ABDOMEN: Soft, non-tender, non-distended EXTREMITIES:  No edema; No deformity   ASSESSMENT AND PLAN  Dyspnea on exertion: Suspicion for heart failure is fairly low.  Her leg edema is nonpitting in nature and more consistent with venous stasis.  She is quite sedentary due to severe osteoarthritis in bilateral knee.  She is also morbidly obese.  I suspect dyspnea on exertion is more related to her frailty and deconditioning.  She denies any chest pain.  I will obtain an echocardiogram as initial assessment.  If echocardiogram is normal, I would not recommend any further cardiac workup.  Dysphagia: Seen by GI service who recommended endoscopy procedure 12/04/2023 at 10 AM.  Will try to obtain echocardiogram prior to that, if normal, she may proceed with endoscopy procedure  Hypertension: Blood pressure well-controlled on amlodipine  and metoprolol  succinate.  Morbid obesity: History of gastric bypass surgery.       Dispo: If echocardiogram is normal, patient can follow-up with cardiology service as needed.  Signed, Scot Ford, PA

## 2023-11-11 NOTE — Patient Instructions (Signed)
 Thank you for choosing [facility]!     Medication Instructions:   No medication changes were made during today's visit.   Leg elevation to help with leg edema which I suspect it is more related to venous insufficiency. Suspicion for heart failure very low.   Call cardiology if dyspnea significantly worsens or develop any chest pain   *If you need a refill on your cardiac medications before your next appointment, please call your pharmacy*   Lab Work: No labs were ordered during today's visit.  If you have labs (blood work) drawn today and your tests are completely normal, you will receive your results only by: MyChart Message (if you have MyChart) OR A paper copy in the mail If you have any lab test that is abnormal or we need to change your treatment, we will call you to review the results.   Testing/Procedures: Your physician has requested that you have an echocardiogram. Echocardiography is a painless test that uses sound waves to create images of your heart. It provides your doctor with information about the size and shape of your heart and how well your heart's chambers and valves are working. This procedure takes approximately one hour. There are no restrictions for this procedure. Please do NOT wear cologne, perfume, aftershave, or lotions (deodorant is allowed). Please arrive 15 minutes prior to your appointment time.  Please note: We ask at that you not bring children with you during ultrasound (echo/ vascular) testing. Due to room size and safety concerns, children are not allowed in the ultrasound rooms during exams. Our front office staff cannot provide observation of children in our lobby area while testing is being conducted. An adult accompanying a patient to their appointment will only be allowed in the ultrasound room at the discretion of the ultrasound technician under special circumstances. We apologize for any inconvenience.   Your next appointment:   follow up as needed if echo is normal      Provider:   Scot Ford, PA-C           Follow-Up: At [facility], you and your health needs are our priority.  As part of our continuing mission to provide you with exceptional heart care, we have created designated Provider Care Teams.  These Care Teams include your primary Cardiologist (physician) and Advanced Practice Providers (APPs -  Physician Assistants and Nurse Practitioners) who all work together to provide you with the care you need, when you need it. We recommend signing up for the patient portal called MyChart.  Sign up information is provided on this After Visit Summary.  MyChart is used to connect with patients for Virtual Visits (Telemedicine).  Patients are able to view lab/test results, encounter notes, upcoming appointments, etc.  Non-urgent messages can be sent to your provider as well.   To learn more about what you can do with MyChart, go to [email or web address].

## 2023-11-12 ENCOUNTER — Telehealth: Payer: Self-pay | Admitting: *Deleted

## 2023-11-12 NOTE — Telephone Encounter (Signed)
 Spoke with Cardiology today about referral for the patient, They have contacted the patient and left a voicemail and she hasn't called back.  They are going to try again today to contact the patient.    Dx: Dyspnea on Exertion and Peripheral edema   Explained to cardiology that we need clearance before her Endoscopy  which is scheduled on 10/3 and she is ok to see a PA  because they are booked so far out.    See 9/10 office note clearing patient for her procedure

## 2023-11-13 ENCOUNTER — Ambulatory Visit (HOSPITAL_COMMUNITY)
Admission: RE | Admit: 2023-11-13 | Discharge: 2023-11-13 | Disposition: A | Discharge status: Home / Self Care | Source: Ambulatory Visit | Attending: Physician Assistant | Admitting: Physician Assistant

## 2023-11-13 DIAGNOSIS — R0609 Other forms of dyspnea: Secondary | ICD-10-CM

## 2023-11-23 ENCOUNTER — Ambulatory Visit (HOSPITAL_COMMUNITY)
Admission: RE | Admit: 2023-11-23 | Discharge: 2023-11-23 | Disposition: A | Discharge status: Home / Self Care | Source: Ambulatory Visit | Attending: Physician Assistant | Admitting: Physician Assistant

## 2023-11-23 DIAGNOSIS — I358 Other nonrheumatic aortic valve disorders: Secondary | ICD-10-CM | POA: Diagnosis not present

## 2023-11-23 DIAGNOSIS — R0609 Other forms of dyspnea: Secondary | ICD-10-CM | POA: Insufficient documentation

## 2023-11-23 LAB — ECHOCARDIOGRAM COMPLETE
Area-P 1/2: 4.15 cm2
Calc EF: 61.6 %
S' Lateral: 3 cm
Single Plane A2C EF: 61.1 %
Single Plane A4C EF: 65.1 %

## 2023-11-23 NOTE — Progress Notes (Signed)
  Echocardiogram 2D Echocardiogram has been performed.  Hailey Vance [facility] 11/23/2023, 9:35 AM

## 2023-11-24 ENCOUNTER — Ambulatory Visit: Payer: Self-pay | Admitting: Physician Assistant

## 2023-11-26 ENCOUNTER — Encounter: Payer: Self-pay | Admitting: Gastroenterology

## 2023-12-02 NOTE — Progress Notes (Unsigned)
 Assessment: 1. Pyonephrosis. left   2. Abnormal urine findings     Plan: Urine culture sent today Begin Cipro  500 mg BID x 5 days pending cx results BMP today Will call with results  Return to office in 6 weeks  Chief Complaint: Chief Complaint  Patient presents with   Pyonephrosis    HPI: Hailey Vance is a 58 y.o. female who presents for continued evaluation of left pyelonephrosis. She was seen in consultation at [facility] on 09/29/2023 for evaluation of left hydronephrosis.  She presented to the emergency room with a 3-day history of left flank pain.  She also had dysuria.  No gross hematuria.  She was found to have leukocytosis with a white count of 17.2 K.  Urinalysis was nitrite positive with >50 WBCs, many bacteria.  Creatinine elevated at 2.04.  CT renal stone study showed left hydronephrosis with air in the left collecting system consistent with pyelonephrosis, left renal cyst, and left renal calculi.  She was started on IV Rocephin .  She was taken to the operating room on 09/29/2023 for cystoscopy, left retrograde pyelogram, and insertion of left ureteral stent.  She was found to have purulent material draining from the left UO with dilation of the left renal pelvis and calyces and purulent drainage from the left kidney following stent placement. Urine culture grew >100 K E. coli.  Blood cultures were negative. Her creatinine improved to 1.02. She was discharged with 2 weeks of cefadroxil .  She presented to the office on 10/21/23 for possible cystoscopy and stent removal. She had some stent related discomfort.  No recent gross hematuria.  She reported some left-sided flank pain.  She was having some itching and diarrhea associated with the cefadroxil  so she was taking 500 mg twice daily.  No fever or chills. Due to an abnormal urinalysis suggesting possible UTI, stent removal was postponed. [facility] urine culture grew E. Coli and Candida.   She was given a 7-day  course of Cipro .  Her stent was removed on 11/04/2023.  She had completed the Cipro .  She was not having any fevers or chills.    She returns today for follow-up.  She did well following removal of the stent.  She reports that her urine was very clear.  2 days ago, she noted very cloudy urine.  Her urine has remained cloudy but cleared slightly.  No dysuria or gross hematuria.  She has experienced some low back pain and right lower quadrant discomfort.  No fevers or chills.  No flank pain.  She is not having any symptoms of a vaginal yeast infection.  Portions of the above documentation were copied from a prior visit for review purposes only.  Allergies: Allergies  Allergen Reactions   Penicillins Itching and Other (See Comments)    Has patient had a PCN reaction causing immediate rash, facial/tongue/throat swelling, SOB or lightheadedness with hypotension: No Has patient had a PCN reaction causing severe rash involving mucus membranes or skin necrosis: No Has patient had a PCN reaction that required hospitalization No Has patient had a PCN reaction occurring within the last 10 years: No If all of the above answers are NO, then may proceed with Cephalosporin use.     PMH: Past Medical History:  Diagnosis Date   Bilateral primary osteoarthritis of knee 03/29/2019   Chronic back pain    Cystocele with rectocele    Generalized anxiety disorder    GERD (gastroesophageal reflux disease)    Headache  otc med prn   Heart murmur    echo in epic 11-26-2017  normal w/ ef 55-60%   History of chest pain    pt referred for atyical chest pain , cardiology evaluation w/ dr berry note in epic 10-25-2014, stated with low risk w/ no ischemia , nuclear ef 70% , done 09-19-2014 in epic,  non-cardiac chest pain   HTN (hypertension)    followed by pcp    (normal nuclear stress test 09-19-2014 in epic)   Low blood potassium    Major depressive disorder    OSA (obstructive sleep apnea)    04/2015  study in e- Severe OSA (AHI = 55.0/hour);   (10-06-2019  per pt has not used cpap since 2019 after gastric bypass)   Osteoarthritis of both hips 06/15/2019   Plantar fasciitis of left foot    S/P gastric bypass 02/15/2018   sleeve   SUI (stress urinary incontinence, female)    Vitamin D  deficiency 09/06/2014   Wears glasses     PSH: Past Surgical History:  Procedure Laterality Date   BLADDER SUSPENSION N/A 10/12/2019   Procedure: TRANSVAGINAL TAPE (TVT) PROCEDURE;  Surgeon: Sarrah Browning, MD;  Location: [facility];  Service: Gynecology;  Laterality: N/A;   CESAREAN SECTION W/BTL  1990   COLONOSCOPY WITH PROPOFOL  N/A 08/30/2015   Procedure: COLONOSCOPY WITH PROPOFOL ;  Surgeon: Gustav LULLA Mcgee, MD;  Location: [facility];  Service: Endoscopy;  Laterality: N/A;   CYSTOCELE REPAIR N/A 10/12/2019   Procedure: ANTERIOR (CYSTOCELE);  Surgeon: Sarrah Browning, MD;  Location: [facility];  Service: Gynecology;  Laterality: N/A;   CYSTOSCOPY N/A 05/23/2015   Procedure: CYSTOSCOPY;  Surgeon: Browning Sarrah, MD;  Location: [facility];  Service: Gynecology;  Laterality: N/A;   CYSTOSCOPY N/A 10/12/2019   Procedure: CYSTOSCOPY;  Surgeon: Sarrah Browning, MD;  Location: [facility];  Service: Gynecology;  Laterality: N/A;   CYSTOSCOPY WITH RETROGRADE PYELOGRAM, URETEROSCOPY AND STENT PLACEMENT Left 09/29/2023   Procedure: CYSTOSCOPY WITH RETROGRADE PYELOGRAM AND STENT INSERTION;  Surgeon: Roseann Adine PARAS., MD;  Location: [facility];  Service: Urology;  Laterality: Left;   HIATAL HERNIA REPAIR N/A 02/15/2018   Procedure: HERNIA REPAIR HIATAL;  Surgeon: Mikell Katz, MD;  Location: [facility];  Service: General;  Laterality: N/A;   HYSTEROSCOPY WITH D & C N/A 01/11/2015   Procedure: DILATATION AND CURETTAGE /HYSTEROSCOPY;  Surgeon: Jolene Gaskins, MD;  Location: [facility];  Service: Gynecology;  Laterality: N/A;   KNEE ARTHROSCOPY Right 2010   LAPAROSCOPIC  GASTRIC SLEEVE RESECTION N/A 02/15/2018   Procedure: LAPAROSCOPIC GASTRIC SLEEVE RESECTION WITH UPPER ENDO AND ERAS PATHWAY;  Surgeon: Mikell Katz, MD;  Location: [facility];  Service: General;  Laterality: N/A;   LAPAROSCOPIC OOPHORECTOMY Bilateral 2004   LEFT OOPHORECTOMY AND PARTIAL RIGHT OOPHORECTOMY   LIPOMA EXCISION Left 12/02/2016   Procedure: EXCISION LIPOMA OF LEFT SHOULDER;  Surgeon: Anderson Maude ORN, MD;  Location: [facility];  Service: Orthopedics;  Laterality: Left;   ROBOTIC ASSISTED TOTAL HYSTERECTOMY WITH SALPINGECTOMY Bilateral 05/23/2015   Procedure: ROBOTIC ASSISTED TOTAL HYSTERECTOMY WITH SALPINGECTOMYwith right oophorectomy;  Surgeon: Browning Sarrah, MD;  Location: [facility];  Service: Gynecology;  Laterality: Bilateral;   UMBILICAL HERNIA REPAIR  2002    SH: Social History   Tobacco Use   Smoking status: Never   Smokeless tobacco: Never  Vaping Use   Vaping status: Never Used  Substance Use Topics   Alcohol use: No    Alcohol/week: 0.0 standard drinks  of alcohol   Drug use: Never    ROS: Constitutional:  Negative for fever, chills, weight loss CV: Negative for chest pain, previous MI, hypertension Respiratory:  Negative for shortness of breath, wheezing, sleep apnea, frequent cough GI:  Negative for nausea, vomiting, bloody stool, GERD  PE: BP 136/88   Pulse 77   Ht 5' 3 (1.6 m)   Wt 250 lb (113.4 kg)   LMP 10/02/2007   BMI 44.29 kg/m  GENERAL APPEARANCE:  Well appearing, well developed, well nourished, NAD HEENT:  Atraumatic, normocephalic, oropharynx clear NECK:  Supple without lymphadenopathy or thyromegaly ABDOMEN:  Soft, non-tender, no masses EXTREMITIES:  Moves all extremities well, without clubbing, cyanosis, or edema NEUROLOGIC:  Alert and oriented x 3, normal gait, CN II-XII grossly intact MENTAL STATUS:  appropriate BACK:  Non-tender to palpation, No CVAT SKIN:  Warm, dry, and intact   Results: U/A: >30 WBCs, 11-30 RBCs, many bacteria,  nitrite negative

## 2023-12-03 ENCOUNTER — Ambulatory Visit: Admitting: Urology

## 2023-12-03 ENCOUNTER — Encounter: Payer: Self-pay | Admitting: Urology

## 2023-12-03 VITALS — BP 136/88 | HR 77 | Ht 63.0 in | Wt 250.0 lb

## 2023-12-03 DIAGNOSIS — R8290 Unspecified abnormal findings in urine: Secondary | ICD-10-CM

## 2023-12-03 DIAGNOSIS — N136 Pyonephrosis: Secondary | ICD-10-CM

## 2023-12-03 LAB — BASIC METABOLIC PANEL WITH GFR
BUN/Creatinine Ratio: 20 (ref 9–23)
BUN: 22 mg/dL (ref 6–24)
CO2: 24 mmol/L (ref 20–29)
Calcium: 8.8 mg/dL (ref 8.7–10.2)
Chloride: 100 mmol/L (ref 96–106)
Creatinine, Ser: 1.09 mg/dL — ABNORMAL HIGH (ref 0.57–1.00)
Glucose: 102 mg/dL — ABNORMAL HIGH (ref 70–99)
Potassium: 4.4 mmol/L (ref 3.5–5.2)
Sodium: 138 mmol/L (ref 134–144)
eGFR: 59 mL/min/1.73 — ABNORMAL LOW (ref >59)

## 2023-12-03 LAB — URINALYSIS, ROUTINE W REFLEX MICROSCOPIC
Bilirubin, UA: NEGATIVE
Glucose, UA: NEGATIVE
Ketones, UA: NEGATIVE
Nitrite, UA: NEGATIVE
Specific Gravity, UA: 1.025 (ref 1.005–1.030)
Urobilinogen, Ur: 0.2 mg/dL (ref 0.2–1.0)
pH, UA: 6 (ref 5.0–7.5)

## 2023-12-03 LAB — MICROSCOPIC EXAMINATION: WBC, UA: >30 /HPF — AB (ref 0–5)

## 2023-12-03 MED ORDER — CIPROFLOXACIN HCL 500 MG PO TABS: 500.0000 mg | ORAL_TABLET | Freq: Two times a day (BID) | ORAL | 0 refills | Status: DC

## 2023-12-04 ENCOUNTER — Encounter: Payer: Self-pay | Admitting: Gastroenterology

## 2023-12-04 ENCOUNTER — Ambulatory Visit (AMBULATORY_SURGERY_CENTER): Admitting: Gastroenterology

## 2023-12-04 VITALS — BP 158/82 | HR 72 | Temp 98.0°F | Resp 13 | Ht 63.0 in | Wt 259.0 lb

## 2023-12-04 DIAGNOSIS — K3189 Other diseases of stomach and duodenum: Secondary | ICD-10-CM | POA: Diagnosis not present

## 2023-12-04 DIAGNOSIS — K319 Disease of stomach and duodenum, unspecified: Secondary | ICD-10-CM | POA: Diagnosis not present

## 2023-12-04 DIAGNOSIS — K259 Gastric ulcer, unspecified as acute or chronic, without hemorrhage or perforation: Secondary | ICD-10-CM

## 2023-12-04 DIAGNOSIS — K449 Diaphragmatic hernia without obstruction or gangrene: Secondary | ICD-10-CM

## 2023-12-04 DIAGNOSIS — R1310 Dysphagia, unspecified: Secondary | ICD-10-CM

## 2023-12-04 MED ORDER — SODIUM CHLORIDE 0.9 % IV SOLN: 500.0000 mL | Freq: Once | INTRAVENOUS | Status: DC

## 2023-12-04 NOTE — Progress Notes (Signed)
 Transferred to PACU via stretcher, arousing, VSS.

## 2023-12-04 NOTE — Patient Instructions (Addendum)
 Take your omeprazole  40 mg before breakfast and pepcid  in the evening as needed. Take them on an empty stomach. Resume all of your previous medications as ordered. Read your discharge instructions. Call the office for an appointment that is good for you.    YOU HAD AN ENDOSCOPIC PROCEDURE TODAY AT THE [facility]:   Refer to the procedure report that was given to you for any specific questions about what was found during the examination.  If the procedure report does not answer your questions, please call your gastroenterologist to clarify.  If you requested that your care partner not be given the details of your procedure findings, then the procedure report has been included in a sealed envelope for you to review at your convenience later.  YOU SHOULD EXPECT: Some feelings of bloating in the abdomen. Passage of more gas than usual.  Walking can help get rid of the air that was put into your GI tract during the procedure and reduce the bloating. If you had a lower endoscopy (such as a colonoscopy or flexible sigmoidoscopy) you may notice spotting of blood in your stool or on the toilet paper. If you underwent a bowel prep for your procedure, you may not have a normal bowel movement for a few days.  Please Note:  You might notice some irritation and congestion in your nose or some drainage.  This is from the oxygen used during your procedure.  There is no need for concern and it should clear up in a day or so.  SYMPTOMS TO REPORT IMMEDIATELY:  Following upper endoscopy (EGD)  Vomiting of blood or coffee ground material  New chest pain or pain under the shoulder blades  Painful or persistently difficult swallowing  New shortness of breath  Fever of 100F or higher  Black, tarry-looking stools  For urgent or emergent issues, a gastroenterologist can be reached at any hour by calling [phone]. Do not use MyChart messaging for urgent concerns.    DIET:  We do recommend a  small meal at first, but then you may proceed to your regular diet.  Drink plenty of fluids but you should avoid alcoholic beverages for 24 hours.  ACTIVITY:  You should plan to take it easy for the rest of today and you should NOT DRIVE or use heavy machinery until tomorrow (because of the sedation medicines used during the test).    FOLLOW UP: Our staff will call the number listed on your records the next business day following your procedure.  We will call around 7:15- 8:00 am to check on you and address any questions or concerns that you may have regarding the information given to you following your procedure. If we do not reach you, we will leave a message.     If any biopsies were taken you will be contacted by phone or by letter within the next 1-3 weeks.  Please call us  at [phone] if you have not heard about the biopsies in 3 weeks.    SIGNATURES/CONFIDENTIALITY: You and/or your care partner have signed paperwork which will be entered into your electronic medical record.  These signatures attest to the fact that that the information above on your After Visit Summary has been reviewed and is understood.  Full responsibility of the confidentiality of this discharge information lies with you and/or your care-partner.

## 2023-12-04 NOTE — Progress Notes (Signed)
  Gastroenterology History and Physical   Primary Care Physician:  Jerome Heron Ruth, PA-C   Reason for Procedure:  Dysphagia  Plan:    EGD with possible interventions as needed     HPI: Hailey Vance is a very pleasant 58 y.o. female here for EGD for evaluation and management of dysphagia. Please refer to office visit 10/13/23 note for additional details  The risks and benefits as well as alternatives of endoscopic procedure(s) have been discussed and reviewed. All questions answered. The patient agrees to proceed.    Past Medical History:  Diagnosis Date   Bilateral primary osteoarthritis of knee 03/29/2019   Chronic back pain    Cystocele with rectocele    Generalized anxiety disorder    GERD (gastroesophageal reflux disease)    Headache    otc med prn   Heart murmur    echo in epic 11-26-2017  normal w/ ef 55-60%   History of chest pain    pt referred for atyical chest pain , cardiology evaluation w/ dr berry note in epic 10-25-2014, stated with low risk w/ no ischemia , nuclear ef 70% , done 09-19-2014 in epic,  non-cardiac chest pain   HTN (hypertension)    followed by pcp    (normal nuclear stress test 09-19-2014 in epic)   Low blood potassium    Major depressive disorder    OSA (obstructive sleep apnea)    04/2015 study in e- Severe OSA (AHI = 55.0/hour);   (10-06-2019  per pt has not used cpap since 2019 after gastric bypass)   Osteoarthritis of both hips 06/15/2019   Plantar fasciitis of left foot    S/P gastric bypass 02/15/2018   sleeve   SUI (stress urinary incontinence, female)    Vitamin D  deficiency 09/06/2014   Wears glasses     Past Surgical History:  Procedure Laterality Date   BLADDER SUSPENSION N/A 10/12/2019   Procedure: TRANSVAGINAL TAPE (TVT) PROCEDURE;  Surgeon: Sarrah Browning, MD;  Location: [facility] [place];  Service: Gynecology;  Laterality: N/A;   CESAREAN SECTION W/BTL  1990   COLONOSCOPY WITH PROPOFOL  N/A  08/30/2015   Procedure: COLONOSCOPY WITH PROPOFOL ;  Surgeon: Gustav LULLA Mcgee, MD;  Location: [facility];  Service: Endoscopy;  Laterality: N/A;   CYSTOCELE REPAIR N/A 10/12/2019   Procedure: ANTERIOR (CYSTOCELE);  Surgeon: Sarrah Browning, MD;  Location: [facility];  Service: Gynecology;  Laterality: N/A;   CYSTOSCOPY N/A 05/23/2015   Procedure: CYSTOSCOPY;  Surgeon: Browning Sarrah, MD;  Location: [facility];  Service: Gynecology;  Laterality: N/A;   CYSTOSCOPY N/A 10/12/2019   Procedure: CYSTOSCOPY;  Surgeon: Sarrah Browning, MD;  Location: [facility];  Service: Gynecology;  Laterality: N/A;   CYSTOSCOPY WITH RETROGRADE PYELOGRAM, URETEROSCOPY AND STENT PLACEMENT Left 09/29/2023   Procedure: CYSTOSCOPY WITH RETROGRADE PYELOGRAM AND STENT INSERTION;  Surgeon: Roseann Adine PARAS., MD;  Location: [facility];  Service: Urology;  Laterality: Left;   HIATAL HERNIA REPAIR N/A 02/15/2018   Procedure: HERNIA REPAIR HIATAL;  Surgeon: Mikell Katz, MD;  Location: [facility];  Service: General;  Laterality: N/A;   HYSTEROSCOPY WITH D & C N/A 01/11/2015   Procedure: DILATATION AND CURETTAGE /HYSTEROSCOPY;  Surgeon: Jolene Gaskins, MD;  Location: [facility];  Service: Gynecology;  Laterality: N/A;   KNEE ARTHROSCOPY Right 2010   LAPAROSCOPIC GASTRIC SLEEVE RESECTION N/A 02/15/2018   Procedure: LAPAROSCOPIC GASTRIC SLEEVE RESECTION WITH UPPER ENDO AND ERAS PATHWAY;  Surgeon: Mikell Katz, MD;  Location: [facility];  Service: General;  Laterality: N/A;   LAPAROSCOPIC OOPHORECTOMY Bilateral 2004   LEFT OOPHORECTOMY AND PARTIAL RIGHT OOPHORECTOMY   LIPOMA EXCISION Left 12/02/2016   Procedure: EXCISION LIPOMA OF LEFT SHOULDER;  Surgeon: Anderson Maude ORN, MD;  Location: [facility];  Service: Orthopedics;  Laterality: Left;   ROBOTIC ASSISTED TOTAL HYSTERECTOMY WITH SALPINGECTOMY Bilateral 05/23/2015   Procedure: ROBOTIC ASSISTED TOTAL HYSTERECTOMY WITH SALPINGECTOMYwith right  oophorectomy;  Surgeon: Rosaline Luna, MD;  Location: [facility];  Service: Gynecology;  Laterality: Bilateral;   UMBILICAL HERNIA REPAIR  2002    Prior to Admission medications   Medication Sig Start Date End Date Taking? Authorizing Provider  amLODipine  (NORVASC ) 5 MG tablet Take 1 tablet (5 mg total) by mouth daily. 10/02/23 12/04/23 Yes Pahwani, Fredia, MD  busPIRone  (BUSPAR ) 7.5 MG tablet Take 1 tablet (7.5 mg total) by mouth 2 (two) times daily. 04/08/23  Yes O'Sullivan, [name], NP  Cholecalciferol (VITAMIN D3 GUMMIES PO) Take 2 tablets by mouth daily. Reports 5000 IU takes 2 gummies daily   Yes [provider]  dicyclomine  (BENTYL ) 10 MG capsule Take 1 capsule (10 mg total) by mouth daily as needed for spasms. 10/13/23  Yes Analena Gama V, MD  dicyclomine  (BENTYL ) 10 MG capsule Take 1 capsule (10 mg total) by mouth 4 (four) times daily -  before meals and at bedtime. 10/13/23  Yes Jaylyne Breese V, MD  DULoxetine  (CYMBALTA ) 60 MG capsule Take 1 capsule (60 mg total) by mouth daily. 04/08/23  Yes O'Sullivan, [name], NP  famotidine  (PEPCID ) 20 MG tablet Take 1 tablet (20 mg total) by mouth at bedtime. 12/30/22  Yes Talin Feister V, MD  gabapentin  (NEURONTIN ) 300 MG capsule Take 1 capsule (300 mg total) by mouth 3 (three) times daily. 10/24/22  Yes O'Sullivan, [name], NP  metoprolol  succinate (TOPROL -XL) 50 MG 24 hr tablet Take 1 tablet (50 mg total) by mouth daily. Take with or immediately following a meal. 10/02/23 12/04/23 Yes Pahwani, Fredia, MD  multivitamin-iron-minerals-folic acid (CENTRUM) chewable tablet Chew 1 tablet by mouth daily.   Yes [provider]  nystatin  cream (MYCOSTATIN ) Apply 1 Application topically 2 (two) times daily. 11/04/23  Yes Stoneking, Adine PARAS., MD  omeprazole  (PRILOSEC) 40 MG capsule Take 1 capsule (40 mg total) by mouth daily. Daily 30 minutes before breakfast 12/30/22  Yes Corayma Cashatt V, MD  ondansetron  (ZOFRAN ) 4 MG tablet Take 1 tablet  (4 mg total) by mouth every 8 (eight) hours as needed for nausea or vomiting. 04/08/23  Yes Daryl Setter, NP  oxyCODONE -acetaminophen  (PERCOCET) 10-325 MG tablet Take 1 tablet by mouth 3 (three) times daily as needed for pain.   Yes [provider]  albuterol  (VENTOLIN  HFA) 108 (90 Base) MCG/ACT inhaler INHALE 2 PUFFS BY MOUTH EVERY 6 HOURS AS NEEDED FOR WHEEZING FOR SHORTNESS OF BREATH 04/08/23   Daryl Setter, NP  Artificial Tear Ointment (DRY EYES OP) Apply to eye as needed.    [provider]  cyanocobalamin  (VITAMIN B12) 1000 MCG/ML injection Inject 1 mL (1,000 mcg total) into the muscle every 30 (thirty) days. 04/08/23   O'Sullivan, [name], NP  diclofenac  Sodium (VOLTAREN ) 1 % GEL Apply 2 g topically daily as needed. 04/08/23   Daryl Setter, NP  NEEDLE, DISP, 25 G (B-D DISP NEEDLE 25GX1) 25G X 1 MISC For B12 injections 04/08/23   Daryl Setter, NP  SYRINGE-NEEDLE, DISP, 3 ML 23G X 1 3 ML MISC For B12 injections 04/08/23   Daryl Setter, NP  tirzepatide  (MOUNJARO ) 12.5 MG/0.5ML  Pen Inject 12.5 mg into the skin once a week. 08/16/23   Gladis Elsie BROCKS, PA-C    Current Outpatient Medications  Medication Sig Dispense Refill   amLODipine  (NORVASC ) 5 MG tablet Take 1 tablet (5 mg total) by mouth daily. 30 tablet 0   busPIRone  (BUSPAR ) 7.5 MG tablet Take 1 tablet (7.5 mg total) by mouth 2 (two) times daily. 60 tablet 2   Cholecalciferol (VITAMIN D3 GUMMIES PO) Take 2 tablets by mouth daily. Reports 5000 IU takes 2 gummies daily     dicyclomine  (BENTYL ) 10 MG capsule Take 1 capsule (10 mg total) by mouth daily as needed for spasms. 90 capsule 3   dicyclomine  (BENTYL ) 10 MG capsule Take 1 capsule (10 mg total) by mouth 4 (four) times daily -  before meals and at bedtime. 90 capsule 0   DULoxetine  (CYMBALTA ) 60 MG capsule Take 1 capsule (60 mg total) by mouth daily. 90 capsule 1   famotidine  (PEPCID ) 20 MG tablet Take 1 tablet (20 mg total) by mouth at  bedtime. 90 tablet 3   gabapentin  (NEURONTIN ) 300 MG capsule Take 1 capsule (300 mg total) by mouth 3 (three) times daily. 270 capsule 0   metoprolol  succinate (TOPROL -XL) 50 MG 24 hr tablet Take 1 tablet (50 mg total) by mouth daily. Take with or immediately following a meal. 30 tablet 0   multivitamin-iron-minerals-folic acid (CENTRUM) chewable tablet Chew 1 tablet by mouth daily.     nystatin  cream (MYCOSTATIN ) Apply 1 Application topically 2 (two) times daily. 30 g 0   omeprazole  (PRILOSEC) 40 MG capsule Take 1 capsule (40 mg total) by mouth daily. Daily 30 minutes before breakfast 90 capsule 3   ondansetron  (ZOFRAN ) 4 MG tablet Take 1 tablet (4 mg total) by mouth every 8 (eight) hours as needed for nausea or vomiting. 20 tablet 0   oxyCODONE -acetaminophen  (PERCOCET) 10-325 MG tablet Take 1 tablet by mouth 3 (three) times daily as needed for pain.     albuterol  (VENTOLIN  HFA) 108 (90 Base) MCG/ACT inhaler INHALE 2 PUFFS BY MOUTH EVERY 6 HOURS AS NEEDED FOR WHEEZING FOR SHORTNESS OF BREATH 9 g 1   Artificial Tear Ointment (DRY EYES OP) Apply to eye as needed.     cyanocobalamin  (VITAMIN B12) 1000 MCG/ML injection Inject 1 mL (1,000 mcg total) into the muscle every 30 (thirty) days. 10 mL 0   diclofenac  Sodium (VOLTAREN ) 1 % GEL Apply 2 g topically daily as needed. 100 g 2   NEEDLE, DISP, 25 G (B-D DISP NEEDLE 25GX1) 25G X 1 MISC For B12 injections 50 each 1   SYRINGE-NEEDLE, DISP, 3 ML 23G X 1 3 ML MISC For B12 injections 50 each 1   tirzepatide  (MOUNJARO ) 12.5 MG/0.5ML Pen Inject 12.5 mg into the skin once a week. 3 mL 0   Current Facility-Administered Medications  Medication Dose Route Frequency Provider Last Rate Last Admin   0.9 %  sodium chloride  infusion  500 mL Intravenous Once Rita Prom V, MD        Allergies as of 12/04/2023 - Review Complete 12/04/2023  Allergen Reaction Noted   Penicillins Itching and Other (See Comments) 04/27/2011    Family History  Problem  Relation Age of Onset   Lung cancer Mother 74       dec   Cancer Mother    Hypertension Mother    Arthritis Mother    Cancer Father 55       prostate   Hypertension Father  Leukemia Sister    Hypertension Sister    Arthritis Sister    Hyperlipidemia Sister    Multiple sclerosis Sister    Hypertension Sister    Hypertension Sister    Hypertension Brother    Arthritis Brother    Hypertension Brother    Hypertension Brother    Breast cancer Paternal Aunt    Alzheimer's disease Paternal Aunt    Diabetes Maternal Grandmother    Stroke Maternal Grandmother    Heart attack Paternal Grandmother    Asthma Other    Colon cancer Neg Hx    Esophageal cancer Neg Hx    Rectal cancer Neg Hx    Stomach cancer Neg Hx     Social History   Socioeconomic History   Marital status: Divorced    Spouse name: Not on file   Number of children: Not on file   Years of education: 12   Highest education level: High school graduate  Occupational History   Occupation: Disability  Tobacco Use   Smoking status: Never   Smokeless tobacco: Never  Vaping Use   Vaping status: Never Used  Substance and Sexual Activity   Alcohol use: No    Alcohol/week: 0.0 standard drinks of alcohol   Drug use: Never   Sexual activity: Not on file    Comment: BTL w/ C/S in 1990  Other Topics Concern   Not on file  Social History Narrative   Divorced   Lives with son and grand-daughter   18- Son Asher (lives in [place])   [id number]- Son Lowanda lives with patient   62- Daughter Harlene (lives in [place])   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   Completed some college   Geophysical data processor   Social Drivers of Health   Financial Resource Strain: Low Risk  (01/01/2023)   Overall Financial Resource Strain (CARDIA)    Difficulty of Paying Living Expenses: Not very hard  Food Insecurity: No Food Insecurity (09/28/2023)   Hunger Vital Sign    Worried About Running Out of Food in the Last Year: Never true     Ran Out of Food in the Last Year: Never true  Transportation Needs: No Transportation Needs (09/28/2023)   PRAPARE - Administrator, Civil Service (Medical): No    Lack of Transportation (Non-Medical): No  Physical Activity: Inactive (01/01/2023)   Exercise Vital Sign    Days of Exercise per Week: 0 days    Minutes of Exercise per Session: 0 min  Stress: No Stress Concern Present (01/01/2023)   [employer] of Occupational Health - Occupational Stress Questionnaire    Feeling of Stress : Only a little  Social Connections: Socially Isolated (01/01/2023)   Social Connection and Isolation Panel    Frequency of Communication with Friends and Family: Three times a week    Frequency of Social Gatherings with Friends and Family: Once a week    Attends Religious Services: Never    Database administrator or Organizations: No    Attends Banker Meetings: Never    Marital Status: Divorced  Catering manager Violence: Not At Risk (09/28/2023)   Humiliation, Afraid, Rape, and Kick questionnaire    Fear of Current or Ex-Partner: No    Emotionally Abused: No    Physically Abused: No    Sexually Abused: No    Review of Systems:  All other review of systems negative except as mentioned in the HPI.  Physical Exam: Vital signs in last  24 hours: BP (!) 148/81   Pulse 74   Temp 98 F (36.7 C)   Ht 5' 3 (1.6 m)   Wt 259 lb (117.5 kg)   LMP 10/02/2007   SpO2 99%   BMI 45.88 kg/m  General:   Alert, NAD Lungs:  Clear .   Heart:  Regular rate and rhythm Abdomen:  Soft, nontender and nondistended. Neuro/Psych:  Alert and cooperative. Normal mood and affect. A and O x 3  Reviewed labs, radiology imaging, old records and pertinent past GI work up  Patient is appropriate for planned procedure(s) and anesthesia in an ambulatory setting   K. Veena Ura Yingling , MD [phone]

## 2023-12-04 NOTE — Progress Notes (Signed)
 Called to room to assist during endoscopic procedure.  Patient ID and intended procedure confirmed with present staff. Received instructions for my participation in the procedure from the performing physician.

## 2023-12-04 NOTE — Progress Notes (Signed)
 Unable to schedule follow-up at this time.  Patient to call office.

## 2023-12-04 NOTE — Progress Notes (Signed)
 Patient states that she doesn't need refills on pepcid .

## 2023-12-04 NOTE — Progress Notes (Signed)
 Patient states that she doesn't need refills on her omeprazole .

## 2023-12-04 NOTE — Op Note (Addendum)
 [facility] Patient Name: Hailey Vance Procedure Date: 12/04/2023 11:05 AM MRN: [id number] Endoscopist: Gustav ALONSO Mcgee , MD, [id number] Age: 58 Referring MD:  Date of Birth: 29-Feb-1966 Gender: Female Account #: [id number] Procedure:                Upper GI endoscopy Indications:              Dysphagia Medicines:                Monitored Anesthesia Care Procedure:                Pre-Anesthesia Assessment:                           - Prior to the procedure, a History and Physical                            was performed, and patient medications and                            allergies were reviewed. The patient's tolerance of                            previous anesthesia was also reviewed. The risks                            and benefits of the procedure and the sedation                            options and risks were discussed with the patient.                            All questions were answered, and informed consent                            was obtained. Prior Anticoagulants: The patient has                            taken no anticoagulant or antiplatelet agents. ASA                            Grade Assessment: III - A patient with severe                            systemic disease. After reviewing the risks and                            benefits, the patient was deemed in satisfactory                            condition to undergo the procedure.                           After obtaining informed consent, the endoscope was  passed under direct vision. Throughout the                            procedure, the patient's blood pressure, pulse, and                            oxygen saturations were monitored continuously. The                            Endoscope was introduced through the mouth, and                            advanced to the second part of duodenum. The upper                            GI endoscopy was accomplished  without difficulty.                            The patient tolerated the procedure well. Scope In: Scope Out: Findings:                 The Z-line was regular and was found 36 cm from the                            incisors.                           No endoscopic abnormality was evident in the                            esophagus to explain the patient's complaint of                            dysphagia.                           A 2 cm hiatal hernia was present.                           A few 5 to 12 mm mucosal papules (nodules) with no                            bleeding and stigmata of recent bleeding were found                            in the gastric antrum and in the prepyloric region                            of the stomach. The nodules were [place]                            classification Is (protruding, sessile). Biopsies                            were  taken with a cold forceps for histology.                           The cardia and gastric fundus were normal on                            retroflexion.                           The examined duodenum was normal. Complications:            No immediate complications. Estimated Blood Loss:     Estimated blood loss was minimal. Impression:               - Z-line regular, 36 cm from the incisors.                           - No endoscopic esophageal abnormality to explain                            patient's dysphagia.                           - 2 cm hiatal hernia.                           - A few mucosal papules (nodules) found in the                            stomach. Biopsied.                           - Normal examined duodenum. Recommendation:           - Resume previous diet.                           - Continue present medications.                           - Await pathology results.                           - No ibuprofen , naproxen , or other non-steroidal                            anti-inflammatory drugs.                            - Use Prilosec (omeprazole ) 40 mg PO daily before                            breakfast and pepcid  20mg  daily in the evening as                            needed                           -  Follow an antireflux regimen.                           - Follow up in GI office, next available                            appointment if has persistent dysphagia consider                            esophageal manometry to exclude significant                            esophageal dysmotility Gustav ALONSO Mcgee, MD 12/04/2023 11:30:09 AM This report has been signed electronically.

## 2023-12-05 LAB — URINE CULTURE

## 2023-12-07 ENCOUNTER — Telehealth: Payer: Self-pay

## 2023-12-07 ENCOUNTER — Ambulatory Visit: Payer: Self-pay | Admitting: Urology

## 2023-12-07 NOTE — Telephone Encounter (Signed)
  Follow up Call-     12/04/2023   10:28 AM  Call back number  Post procedure Call Back phone  # [phone]  Permission to leave phone message Yes     Patient questions:  Do you have a fever, pain , or abdominal swelling? No. Pain Score  0 *  Have you tolerated food without any problems? Yes.    Have you been able to return to your normal activities? Yes.    Do you have any questions about your discharge instructions: Diet   No. Medications  No. Follow up visit  No.  Do you have questions or concerns about your Care? No.  Actions: * If pain score is 4 or above: No action needed, pain <4.

## 2023-12-08 LAB — SURGICAL PATHOLOGY

## 2023-12-09 ENCOUNTER — Ambulatory Visit: Payer: Self-pay | Admitting: Gastroenterology

## 2023-12-14 NOTE — Progress Notes (Signed)
 Hailey Vance                                          MRN: [id number]   12/14/2023   The [facility] Quality Team Specialist reviewed this patient medical record for the purposes of chart review for care gap closure. The following were reviewed: chart review for care gap closure-breast cancer screening.    [facility] Quality Team

## 2023-12-16 ENCOUNTER — Other Ambulatory Visit: Payer: Self-pay | Admitting: Orthopaedic Surgery

## 2023-12-22 ENCOUNTER — Other Ambulatory Visit (INDEPENDENT_AMBULATORY_CARE_PROVIDER_SITE_OTHER)

## 2023-12-22 ENCOUNTER — Ambulatory Visit: Admitting: Orthopaedic Surgery

## 2023-12-22 DIAGNOSIS — M170 Bilateral primary osteoarthritis of knee: Secondary | ICD-10-CM | POA: Diagnosis not present

## 2023-12-22 DIAGNOSIS — G8929 Other chronic pain: Secondary | ICD-10-CM

## 2023-12-22 DIAGNOSIS — M1712 Unilateral primary osteoarthritis, left knee: Secondary | ICD-10-CM

## 2023-12-22 DIAGNOSIS — M25511 Pain in right shoulder: Secondary | ICD-10-CM | POA: Diagnosis not present

## 2023-12-22 DIAGNOSIS — M1711 Unilateral primary osteoarthritis, right knee: Secondary | ICD-10-CM

## 2023-12-22 MED ORDER — LIDOCAINE HCL 1 % IJ SOLN
2.0000 mL | INTRAMUSCULAR | Status: AC | PRN
  Administered 2023-12-22: 2 mL

## 2023-12-22 MED ORDER — METHYLPREDNISOLONE ACETATE 40 MG/ML IJ SUSP
40.0000 mg | INTRAMUSCULAR | Status: AC | PRN
  Administered 2023-12-22: 40 mg via INTRA_ARTICULAR

## 2023-12-22 MED ORDER — BUPIVACAINE HCL 0.5 % IJ SOLN
2.0000 mL | INTRAMUSCULAR | Status: AC | PRN
  Administered 2023-12-22: 2 mL via INTRA_ARTICULAR

## 2023-12-22 NOTE — Progress Notes (Signed)
 Office Visit Note   Patient: Hailey Vance           Date of Birth: 10/12/1965           MRN: [id number] Visit Date: 12/22/2023              Requested by: Jerome Heron Ruth, PA-C [name] [place], [place],  [place] [address] PCP: Jerome Heron Ruth, PA-C   Assessment & Plan: Visit Diagnoses:  1. Chronic right shoulder pain   2. Primary osteoarthritis of right knee   3. Primary osteoarthritis of left knee     Plan: History of Present Illness Hailey Vance is a 58 year old female who presents for cortisone injections in both knees and evaluation of right shoulder pain.  She experiences right shoulder pain that began recently. She is unable to lift her right arm independently and uses her other hand to assist. She had a lipoma removed in the past that caused similar symptoms. No recent injury to the shoulder is noted. Shoulder x-rays have been taken. She is not currently on any medication for the shoulder pain.  Physical Exam MUSCULOSKELETAL: Right shoulder stiffness and pain on movement, with stiffness in forward flexion.  External rotation and abduction preserved.  Manual muscle testing of the rotator cuff is limited secondary to pain and guarding.  Assessment and Plan Right shoulder rotator cuff tear with adhesive capsulitis Suspected rotator cuff tear, likely supraspinatus, with adhesive capsulitis. X-rays negative for fractures. Pain and limited range of motion noted. Prednisone  relieved symptoms but increased appetite, complicating weight loss for knee surgery. - Refer to Dr. Burnetta for cortisone injection with ultrasound guidance. - Order physical therapy to improve strength and range of motion. - Consider MRI if no improvement with initial treatment.  Bilateral knee osteoarthritis - Repeat cortisone injections today.  Follow-Up Instructions: No follow-ups on file.   Orders:  Orders Placed This Encounter  Procedures   XR Shoulder Right   No orders of the  defined types were placed in this encounter.     Procedures: Large Joint Inj: bilateral knee on 12/22/2023 7:59 AM Indications: pain Details: 22 G needle  Arthrogram: No  Medications (Right): 2 mL lidocaine  1 %; 2 mL bupivacaine  0.5 %; 40 mg methylPREDNISolone  acetate 40 MG/ML Medications (Left): 2 mL lidocaine  1 %; 2 mL bupivacaine  0.5 %; 40 mg methylPREDNISolone  acetate 40 MG/ML Outcome: tolerated well, no immediate complications Patient was prepped and draped in the usual sterile fashion.       Clinical Data: No additional findings.   Subjective: Chief Complaint  Patient presents with   Right Knee - Pain   Left Knee - Pain   Right Shoulder - Pain    HPI  Review of Systems  Constitutional: Negative.   HENT: Negative.    Eyes: Negative.   Respiratory: Negative.    Cardiovascular: Negative.   Endocrine: Negative.   Musculoskeletal: Negative.   Neurological: Negative.   Hematological: Negative.   Psychiatric/Behavioral: Negative.    All other systems reviewed and are negative.    Objective: Vital Signs: LMP 10/02/2007   Physical Exam Vitals and nursing note reviewed.  Constitutional:      Appearance: She is well-developed.  HENT:     Head: Atraumatic.     Nose: Nose normal.  Eyes:     Extraocular Movements: Extraocular movements intact.  Cardiovascular:     Pulses: Normal pulses.  Pulmonary:     Effort: Pulmonary effort is normal.  Abdominal:  Palpations: Abdomen is soft.  Musculoskeletal:     Cervical back: Neck supple.  Skin:    General: Skin is warm.     Capillary Refill: Capillary refill takes less than 2 seconds.  Neurological:     Mental Status: She is alert. Mental status is at baseline.  Psychiatric:        Behavior: Behavior normal.        Thought Content: Thought content normal.        Judgment: Judgment normal.     Ortho Exam  Specialty Comments:  CLINICAL DATA:  Lumbar radiculopathy. Bilateral leg weakness.  Lumbar radiculopathy, symptoms persist with greater than 6 weeks treatment. Lumbar radiculopathy and bilateral lower extremity weakness. Additional history provided by scanning technologist: Patient reports lower extremity weakness and ongoing radicular symptoms.   EXAM: MRI LUMBAR SPINE WITHOUT CONTRAST   TECHNIQUE: Multiplanar, multisequence MR imaging of the lumbar spine was performed. No intravenous contrast was administered.   COMPARISON:  Prior lumbar spine radiographs 08/22/2020 and earlier. Lumbar spine MRI 06/29/2013.   FINDINGS: Segmentation: 5 lumbar vertebrae. The caudal most well-formed intervertebral disc space is designated L5-S1.   Alignment: Trace L4-L5 grade 1 anterolisthesis, new from the prior MRI.   Vertebrae: Vertebral body height is maintained. No significant marrow edema or focal suspicious osseous lesion.   Conus medullaris and cauda equina: Conus extends to the L2 level. No signal abnormality within the visualized distal spinal cord.   Paraspinal and other soft tissues: Multiple large bilateral renal cysts, incompletely imaged and incompletely assessed. Paraspinal soft tissues unremarkable.   Disc levels:   Unless otherwise stated, the level by level findings below have not significantly changed from the prior MRI of 06/29/2013.   Mild disc degeneration at L3-L4, L4-L5 and L5-S1, slightly progressed.   T11-T12: This level is imaged in the sagittal plane only. Slight disc bulge. Mild facet arthrosis and ligamentum flavum hypertrophy. No significant spinal canal or foraminal stenosis.   T12-L1: Slight disc bulge. Mild facet arthrosis. No significant spinal canal or foraminal stenosis.   L1-L2: Minimal facet and ligamentum flavum hypertrophy. No significant disc herniation or stenosis.   L2-L3: Mild facet arthrosis and ligamentum flavum hypertrophy. No significant disc herniation or stenosis.   L3-L4: Small disc bulge. Progressive moderate  facet arthrosis with ligamentum flavum hypertrophy. Progressive mild epidural lipomatosis. Progressive mild narrowing of the spinal canal. No significant foraminal stenosis.   L4-L5: Trace grade 1 anterolisthesis, new from the prior exam. Slight disc uncovering. Moderate bilateral facet arthrosis with ligamentum flavum hypertrophy. Mild epidural lipomatosis. No significant spinal canal stenosis. Minimal relative narrowing of the left neural foramen.   L5-S1: Mild-to-moderate facet arthrosis with ligamentum flavum hypertrophy. No significant disc herniation or stenosis.   IMPRESSION: Lumbar and lower thoracic spondylosis, and mild epidural lipomatosis, as described and slightly progressed at multiple levels since the prior MRI of 06/29/2013. No more than mild spinal canal or neural foraminal narrowing. Mild multilevel disc degeneration, greatest at L3-L4, L3-L4 and L4-L5.   Trace L4-L5 grade 1 anterolisthesis, new from the prior MRI.   Multiple incompletely imaged large bilateral renal cysts.     Electronically Signed   By: Rockey Childs D.O.   On: 06/19/2021 09:48  Imaging: No results found.   PMFS History: Patient Active Problem List   Diagnosis Date Noted   Pyonephrosis 09/29/2023   Acute pyonephrosis 09/29/2023   Urinary tract infection without hematuria 09/29/2023   AKI (acute kidney injury) 09/29/2023   Acute pyelonephritis 09/28/2023  Cervical radiculopathy 04/08/2023   Nausea 04/08/2023   Numbness and tingling 04/08/2023   Hematuria 04/08/2023   Fibromyalgia 12/31/2022   Gastroesophageal reflux disease 05/14/2022   History of kidney stones 05/14/2022   Renal cyst 06/26/2021   Left shoulder pain 09/12/2020   Primary osteoarthritis, left shoulder 09/11/2020   Dysphagia 08/23/2020   B12 deficiency 08/22/2020   Hyperlipidemia 08/22/2020   Moderate episode of recurrent major depressive disorder (HCC) 08/22/2020   Major depressive disorder    Osteoarthritis  of both hips 06/15/2019   Osteoarthritis of knees, bilateral 03/29/2019   Morbid obesity (HCC) 02/15/2018   Lipoma of left shoulder 12/02/2016   Obesity, Class III, BMI 40-49.9 (morbid obesity) (HCC)    Preventative health care 10/10/2014   Essential hypertension 09/06/2014   Osteoarthritis 09/06/2014   Post-menopausal bleeding 09/06/2014   OSA (obstructive sleep apnea) 09/06/2014   Generalized anxiety disorder 09/06/2014   Vitamin D  deficiency 09/06/2014   Chronic back pain    Past Medical History:  Diagnosis Date   Bilateral primary osteoarthritis of knee 03/29/2019   Chronic back pain    Cystocele with rectocele    Generalized anxiety disorder    GERD (gastroesophageal reflux disease)    Headache    otc med prn   Heart murmur    echo in epic 11-26-2017  normal w/ ef 55-60%   History of chest pain    pt referred for atyical chest pain , cardiology evaluation w/ dr berry note in epic 10-25-2014, stated with low risk w/ no ischemia , nuclear ef 70% , done 09-19-2014 in epic,  non-cardiac chest pain   HTN (hypertension)    followed by pcp    (normal nuclear stress test 09-19-2014 in epic)   Low blood potassium    Major depressive disorder    OSA (obstructive sleep apnea)    04/2015 study in e- Severe OSA (AHI = 55.0/hour);   (10-06-2019  per pt has not used cpap since 2019 after gastric bypass)   Osteoarthritis of both hips 06/15/2019   Plantar fasciitis of left foot    S/P gastric bypass 02/15/2018   sleeve   SUI (stress urinary incontinence, female)    Vitamin D  deficiency 09/06/2014   Wears glasses     Family History  Problem Relation Age of Onset   Lung cancer Mother 54       dec   Cancer Mother    Hypertension Mother    Arthritis Mother    Cancer Father 51       prostate   Hypertension Father    Leukemia Sister    Hypertension Sister    Arthritis Sister    Hyperlipidemia Sister    Multiple sclerosis Sister    Hypertension Sister    Hypertension Sister     Hypertension Brother    Arthritis Brother    Hypertension Brother    Hypertension Brother    Breast cancer Paternal Aunt    Alzheimer's disease Paternal Aunt    Diabetes Maternal Grandmother    Stroke Maternal Grandmother    Heart attack Paternal Grandmother    Asthma Other    Colon cancer Neg Hx    Esophageal cancer Neg Hx    Rectal cancer Neg Hx    Stomach cancer Neg Hx     Past Surgical History:  Procedure Laterality Date   BLADDER SUSPENSION N/A 10/12/2019   Procedure: TRANSVAGINAL TAPE (TVT) PROCEDURE;  Surgeon: Sarrah Browning, MD;  Location: [facility];  Service:  Gynecology;  Laterality: N/A;   CESAREAN SECTION W/BTL  1990   COLONOSCOPY WITH PROPOFOL  N/A 08/30/2015   Procedure: COLONOSCOPY WITH PROPOFOL ;  Surgeon: Gustav LULLA Mcgee, MD;  Location: [facility];  Service: Endoscopy;  Laterality: N/A;   CYSTOCELE REPAIR N/A 10/12/2019   Procedure: ANTERIOR (CYSTOCELE);  Surgeon: Sarrah Browning, MD;  Location: [name];  Service: Gynecology;  Laterality: N/A;   CYSTOSCOPY N/A 05/23/2015   Procedure: CYSTOSCOPY;  Surgeon: Browning Sarrah, MD;  Location: [facility];  Service: Gynecology;  Laterality: N/A;   CYSTOSCOPY N/A 10/12/2019   Procedure: CYSTOSCOPY;  Surgeon: Sarrah Browning, MD;  Location: [facility];  Service: Gynecology;  Laterality: N/A;   CYSTOSCOPY WITH RETROGRADE PYELOGRAM, URETEROSCOPY AND STENT PLACEMENT Left 09/29/2023   Procedure: CYSTOSCOPY WITH RETROGRADE PYELOGRAM AND STENT INSERTION;  Surgeon: Roseann Adine PARAS., MD;  Location: [facility];  Service: Urology;  Laterality: Left;   HIATAL HERNIA REPAIR N/A 02/15/2018   Procedure: HERNIA REPAIR HIATAL;  Surgeon: Mikell Katz, MD;  Location: [facility];  Service: General;  Laterality: N/A;   HYSTEROSCOPY WITH D & C N/A 01/11/2015   Procedure: DILATATION AND CURETTAGE /HYSTEROSCOPY;  Surgeon: Jolene Gaskins, MD;  Location: [facility];  Service: Gynecology;  Laterality:  N/A;   KNEE ARTHROSCOPY Right 2010   LAPAROSCOPIC GASTRIC SLEEVE RESECTION N/A 02/15/2018   Procedure: LAPAROSCOPIC GASTRIC SLEEVE RESECTION WITH UPPER ENDO AND ERAS PATHWAY;  Surgeon: Mikell Katz, MD;  Location: [facility];  Service: General;  Laterality: N/A;   LAPAROSCOPIC OOPHORECTOMY Bilateral 2004   LEFT OOPHORECTOMY AND PARTIAL RIGHT OOPHORECTOMY   LIPOMA EXCISION Left 12/02/2016   Procedure: EXCISION LIPOMA OF LEFT SHOULDER;  Surgeon: Anderson Maude ORN, MD;  Location: [facility];  Service: Orthopedics;  Laterality: Left;   ROBOTIC ASSISTED TOTAL HYSTERECTOMY WITH SALPINGECTOMY Bilateral 05/23/2015   Procedure: ROBOTIC ASSISTED TOTAL HYSTERECTOMY WITH SALPINGECTOMYwith right oophorectomy;  Surgeon: Browning Sarrah, MD;  Location: [facility];  Service: Gynecology;  Laterality: Bilateral;   UMBILICAL HERNIA REPAIR  2002   Social History   Occupational History   Occupation: Disability  Tobacco Use   Smoking status: Never   Smokeless tobacco: Never  Vaping Use   Vaping status: Never Used  Substance and Sexual Activity   Alcohol use: No    Alcohol/week: 0.0 standard drinks of alcohol   Drug use: Never   Sexual activity: Not on file    Comment: BTL w/ C/S in 1990

## 2023-12-22 NOTE — Addendum Note (Signed)
 Addended by: Shanik Brookshire on: 12/22/2023 02:13 PM   Modules accepted: Orders

## 2024-01-01 ENCOUNTER — Other Ambulatory Visit: Payer: Self-pay

## 2024-01-01 ENCOUNTER — Encounter: Payer: Self-pay | Admitting: Sports Medicine

## 2024-01-01 ENCOUNTER — Ambulatory Visit: Admitting: Sports Medicine

## 2024-01-01 DIAGNOSIS — M7501 Adhesive capsulitis of right shoulder: Secondary | ICD-10-CM

## 2024-01-01 DIAGNOSIS — G8929 Other chronic pain: Secondary | ICD-10-CM | POA: Diagnosis not present

## 2024-01-01 DIAGNOSIS — M12811 Other specific arthropathies, not elsewhere classified, right shoulder: Secondary | ICD-10-CM | POA: Diagnosis not present

## 2024-01-01 DIAGNOSIS — M25511 Pain in right shoulder: Secondary | ICD-10-CM

## 2024-01-01 MED ORDER — BUPIVACAINE HCL 0.25 % IJ SOLN
2.0000 mL | INTRAMUSCULAR | Status: AC | PRN
  Administered 2024-01-01: 2 mL via INTRA_ARTICULAR

## 2024-01-01 MED ORDER — METHYLPREDNISOLONE ACETATE 40 MG/ML IJ SUSP
80.0000 mg | INTRAMUSCULAR | Status: AC | PRN
  Administered 2024-01-01: 80 mg via INTRA_ARTICULAR

## 2024-01-01 MED ORDER — LIDOCAINE HCL 1 % IJ SOLN
2.0000 mL | INTRAMUSCULAR | Status: AC | PRN
  Administered 2024-01-01: 2 mL

## 2024-01-01 NOTE — Progress Notes (Signed)
 Hailey Vance - 58 y.o. female MRN [id number]  Date of birth: 10-09-1965  Office Visit Note: Visit Date: 01/01/2024 PCP: Jerome Heron Ruth, PA-C Referred by: Jerome Heron Ruth, [name]  Subjective: Chief Complaint  Patient presents with   Right Shoulder - Pain   HPI: Hailey Vance is a pleasant 58 y.o. female who presents today for chronic right shoulder pain. She is RHD.  She has been experiencing right shoulder pain over the last few weeks to months.  She is right-hand dominant and does notice she is having pain with using this right side.  She had a lipoma removed on the contralateral left shoulder but nothing on the right side.  No specific injury.  She has seen Dr. Jerri for this in the past, he did refer her to physical therapy at home and she has had her first initial evaluation.  She does have an appointment today and is asking about PT today and going forward.  She is managed on chronic oxycodone  10 mg 3 times daily as needed for her low back and overall joint pain  She is not a diabetic, no history of thyroid  disease.  Pertinent ROS were reviewed with the patient and found to be negative unless otherwise specified above in HPI.   Assessment & Plan: Visit Diagnoses:  1. Chronic right shoulder pain   2. Adhesive capsulitis of right shoulder   3. Rotator cuff arthropathy of right shoulder    Plan: Impression is chronic right shoulder pain in which I believe 2 pathologies may be at play.  She certainly has pain in associated weakness with activation of the rotator cuff, specifically with the supraspinatus.  She does however have restriction to range of motion both actively and passively so the degree of frozen shoulder is likely involved.  We discussed treatment and diagnostic options.  Through shared decision, we decided to proceed with ultrasound-guided right shoulder intra-articular injection for both diagnostic and therapeutic purposes.  She will send me a MyChart update  in 2 weeks to report the degree of improvement (0-100%) she received from the injection.  We did discuss the role for consideration of additional higher volume injection for adhesive capsulitis if she finds this form greater than 50% improved.  I would like her to continue her home therapy, discussed only gentle range of motion exercises today given she had an injection.  After 48 to 72 hours from injection she may return to activity as tolerated and progress through her PT.  She will use ice/heat as well as Tylenol  for any postinjection pain.  Fine for her to continue her chronic oxycodone  that she takes for her low back and chronic pain.  Will send me a MyChart message in 2 weeks for consideration of next steps.  Ultimately we will get her back to Dr. Jerri.  Follow-up: Return for Will send me Mychart message with update (0-100%) in 2 weeks.   Meds & Orders: No orders of the defined types were placed in this encounter.   Orders Placed This Encounter  Procedures   Large Joint Inj   US  Guided Needle Placement - No Linked Charges     Procedures: Large Joint Inj: R glenohumeral on 01/01/2024 10:12 AM Indications: pain Details: 22 G 3.5 in needle, ultrasound-guided posterior approach Medications: 2 mL lidocaine  1 %; 2 mL bupivacaine  0.25 %; 80 mg methylPREDNISolone  acetate 40 MG/ML Outcome: tolerated well, no immediate complications  US -guided glenohumeral joint injection, right shoulder After discussion on risks/benefits/indications, informed  verbal consent was obtained. A timeout was then performed. The patient was positioned lying lateral recumbent on examination table. The patient's shoulder was prepped with betadine  and multiple alcohol swabs and utilizing ultrasound guidance, the patient's glenohumeral joint was identified on ultrasound. Using ultrasound guidance a 22-gauge, 3.5 inch needle with a mixture of 2:2:2 cc's lidocaine :bupivicaine:depomedrol was directed from a lateral to medial  direction via in-plane technique into the glenohumeral joint with visualization of appropriate spread of injectate into the joint. Patient tolerated the procedure well without immediate complications.      Procedure, treatment alternatives, risks and benefits explained, specific risks discussed. Consent was given by the patient. Immediately prior to procedure a time out was called to verify the correct patient, procedure, equipment, support staff and site/side marked as required. Patient was prepped and draped in the usual sterile fashion.          Clinical History:   She reports that she has never smoked. She has never used smokeless tobacco. No results for input(s): HGBA1C, LABURIC in the last 8760 hours.  Objective:   Vital Signs: LMP 10/02/2007   Physical Exam  Gen: Well-appearing, in no acute distress; non-toxic CV: Well-perfused. Warm.  Resp: Breathing unlabored on room air; no wheezing. Psych: Fluid speech in conversation; appropriate affect; normal thought process  Ortho Exam - Right shoulder: There is no specific redness swelling or effusion.  There is restriction in both active and passive range of motion although she certainly is able to go further passively.  She does have a positive drop arm as well as pain and weakness with resisted abduction and empty can testing.  Range of motion is as follows of the right shoulder: - Flexion: 100 degrees actively, 135 degrees passively -Abduction: 90 degrees actively, 120 degrees passively - External rotation: 40 degrees and 45 degrees respectively - Internal rotation: Thumb to L4 compared to thumb to L2 on the contralateral arm  Imaging:  *Three-view x-ray of the right shoulder from 12/22/2023 was independently reviewed and interpreted by myself today.  X-rays demonstrate a well-seated humeral head.  There is minimal arthritic change at the [facility] joint and no significant glenohumeral joint arthritic change.  Past  Medical/Family/Surgical/Social History: Medications & Allergies reviewed per EMR, new medications updated. Patient Active Problem List   Diagnosis Date Noted   Pyonephrosis 09/29/2023   Acute pyonephrosis 09/29/2023   Urinary tract infection without hematuria 09/29/2023   AKI (acute kidney injury) 09/29/2023   Acute pyelonephritis 09/28/2023   Cervical radiculopathy 04/08/2023   Nausea 04/08/2023   Numbness and tingling 04/08/2023   Hematuria 04/08/2023   Fibromyalgia 12/31/2022   Gastroesophageal reflux disease 05/14/2022   History of kidney stones 05/14/2022   Renal cyst 06/26/2021   Left shoulder pain 09/12/2020   Primary osteoarthritis, left shoulder 09/11/2020   Dysphagia 08/23/2020   B12 deficiency 08/22/2020   Hyperlipidemia 08/22/2020   Moderate episode of recurrent major depressive disorder (HCC) 08/22/2020   Major depressive disorder    Osteoarthritis of both hips 06/15/2019   Osteoarthritis of knees, bilateral 03/29/2019   Morbid obesity (HCC) 02/15/2018   Lipoma of left shoulder 12/02/2016   Obesity, Class III, BMI 40-49.9 (morbid obesity) (HCC)    Preventative health care 10/10/2014   Essential hypertension 09/06/2014   Osteoarthritis 09/06/2014   Post-menopausal bleeding 09/06/2014   OSA (obstructive sleep apnea) 09/06/2014   Generalized anxiety disorder 09/06/2014   Vitamin D  deficiency 09/06/2014   Chronic back pain    Past Medical History:  Diagnosis Date  Bilateral primary osteoarthritis of knee 03/29/2019   Chronic back pain    Cystocele with rectocele    Generalized anxiety disorder    GERD (gastroesophageal reflux disease)    Headache    otc med prn   Heart murmur    echo in epic 11-26-2017  normal w/ ef 55-60%   History of chest pain    pt referred for atyical chest pain , cardiology evaluation w/ dr berry note in epic 10-25-2014, stated with low risk w/ no ischemia , nuclear ef 70% , done 09-19-2014 in epic,  non-cardiac chest pain   HTN  (hypertension)    followed by pcp    (normal nuclear stress test 09-19-2014 in epic)   Low blood potassium    Major depressive disorder    OSA (obstructive sleep apnea)    04/2015 study in e- Severe OSA (AHI = 55.0/hour);   (10-06-2019  per pt has not used cpap since 2019 after gastric bypass)   Osteoarthritis of both hips 06/15/2019   Plantar fasciitis of left foot    S/P gastric bypass 02/15/2018   sleeve   SUI (stress urinary incontinence, female)    Vitamin D  deficiency 09/06/2014   Wears glasses    Family History  Problem Relation Age of Onset   Lung cancer Mother 66       dec   Cancer Mother    Hypertension Mother    Arthritis Mother    Cancer Father 35       prostate   Hypertension Father    Leukemia Sister    Hypertension Sister    Arthritis Sister    Hyperlipidemia Sister    Multiple sclerosis Sister    Hypertension Sister    Hypertension Sister    Hypertension Brother    Arthritis Brother    Hypertension Brother    Hypertension Brother    Breast cancer Paternal Aunt    Alzheimer's disease Paternal Aunt    Diabetes Maternal Grandmother    Stroke Maternal Grandmother    Heart attack Paternal Grandmother    Asthma Other    Colon cancer Neg Hx    Esophageal cancer Neg Hx    Rectal cancer Neg Hx    Stomach cancer Neg Hx    Past Surgical History:  Procedure Laterality Date   BLADDER SUSPENSION N/A 10/12/2019   Procedure: TRANSVAGINAL TAPE (TVT) PROCEDURE;  Surgeon: Sarrah Browning, MD;  Location: [facility];  Service: Gynecology;  Laterality: N/A;   CESAREAN SECTION W/BTL  1990   COLONOSCOPY WITH PROPOFOL  N/A 08/30/2015   Procedure: COLONOSCOPY WITH PROPOFOL ;  Surgeon: Gustav LULLA Mcgee, MD;  Location: [facility];  Service: Endoscopy;  Laterality: N/A;   CYSTOCELE REPAIR N/A 10/12/2019   Procedure: ANTERIOR (CYSTOCELE);  Surgeon: Sarrah Browning, MD;  Location: [facility];  Service: Gynecology;  Laterality: N/A;   CYSTOSCOPY  N/A 05/23/2015   Procedure: CYSTOSCOPY;  Surgeon: Browning Sarrah, MD;  Location: [facility];  Service: Gynecology;  Laterality: N/A;   CYSTOSCOPY N/A 10/12/2019   Procedure: CYSTOSCOPY;  Surgeon: Sarrah Browning, MD;  Location: [facility];  Service: Gynecology;  Laterality: N/A;   CYSTOSCOPY WITH RETROGRADE PYELOGRAM, URETEROSCOPY AND STENT PLACEMENT Left 09/29/2023   Procedure: CYSTOSCOPY WITH RETROGRADE PYELOGRAM AND STENT INSERTION;  Surgeon: Roseann Adine PARAS., MD;  Location: [facility];  Service: Urology;  Laterality: Left;   HIATAL HERNIA REPAIR N/A 02/15/2018   Procedure: HERNIA REPAIR HIATAL;  Surgeon: Mikell Katz, MD;  Location: [facility];  Service: General;  Laterality: N/A;   HYSTEROSCOPY WITH D & C N/A 01/11/2015   Procedure: DILATATION AND CURETTAGE /HYSTEROSCOPY;  Surgeon: Jolene Gaskins, MD;  Location: [facility];  Service: Gynecology;  Laterality: N/A;   KNEE ARTHROSCOPY Right 2010   LAPAROSCOPIC GASTRIC SLEEVE RESECTION N/A 02/15/2018   Procedure: LAPAROSCOPIC GASTRIC SLEEVE RESECTION WITH UPPER ENDO AND ERAS PATHWAY;  Surgeon: Mikell Katz, MD;  Location: [facility];  Service: General;  Laterality: N/A;   LAPAROSCOPIC OOPHORECTOMY Bilateral 2004   LEFT OOPHORECTOMY AND PARTIAL RIGHT OOPHORECTOMY   LIPOMA EXCISION Left 12/02/2016   Procedure: EXCISION LIPOMA OF LEFT SHOULDER;  Surgeon: Anderson Maude ORN, MD;  Location: [facility];  Service: Orthopedics;  Laterality: Left;   ROBOTIC ASSISTED TOTAL HYSTERECTOMY WITH SALPINGECTOMY Bilateral 05/23/2015   Procedure: ROBOTIC ASSISTED TOTAL HYSTERECTOMY WITH SALPINGECTOMYwith right oophorectomy;  Surgeon: Rosaline Luna, MD;  Location: [facility];  Service: Gynecology;  Laterality: Bilateral;   UMBILICAL HERNIA REPAIR  2002   Social History   Occupational History   Occupation: Disability  Tobacco Use   Smoking status: Never   Smokeless tobacco: Never  Vaping Use   Vaping status: Never Used  Substance and Sexual Activity    Alcohol use: No    Alcohol/week: 0.0 standard drinks of alcohol   Drug use: Never   Sexual activity: Not on file    Comment: BTL w/ C/S in 1990

## 2024-01-04 ENCOUNTER — Encounter: Payer: Self-pay | Admitting: Radiology

## 2024-01-14 ENCOUNTER — Ambulatory Visit: Admitting: Urology

## 2024-01-16 ENCOUNTER — Encounter: Payer: Self-pay | Admitting: Sports Medicine

## 2024-02-01 ENCOUNTER — Encounter: Payer: Self-pay | Admitting: Orthopaedic Surgery

## 2024-02-01 ENCOUNTER — Other Ambulatory Visit: Payer: Self-pay

## 2024-02-01 DIAGNOSIS — G8929 Other chronic pain: Secondary | ICD-10-CM

## 2024-02-01 NOTE — Telephone Encounter (Signed)
 Please order MRI right shoulder.  Thanks.

## 2024-02-11 ENCOUNTER — Other Ambulatory Visit: Payer: Self-pay

## 2024-02-11 DIAGNOSIS — M5412 Radiculopathy, cervical region: Secondary | ICD-10-CM

## 2024-02-11 DIAGNOSIS — M5416 Radiculopathy, lumbar region: Secondary | ICD-10-CM

## 2024-02-23 ENCOUNTER — Encounter: Payer: Self-pay | Admitting: Orthopaedic Surgery

## 2024-02-23 ENCOUNTER — Other Ambulatory Visit: Payer: Self-pay

## 2024-02-23 DIAGNOSIS — G8929 Other chronic pain: Secondary | ICD-10-CM

## 2024-02-23 NOTE — Telephone Encounter (Signed)
 Please order MRI right shoulder

## 2024-02-27 ENCOUNTER — Encounter: Payer: Self-pay | Admitting: Orthopaedic Surgery

## 2024-02-29 ENCOUNTER — Other Ambulatory Visit: Payer: Self-pay

## 2024-02-29 NOTE — Telephone Encounter (Signed)
 Do we know the status of her MRI of the right shoulder?

## 2024-02-29 NOTE — Telephone Encounter (Signed)
 Let's get MRIs of the right shoulder please.  Thanks.

## 2024-03-04 ENCOUNTER — Encounter: Payer: Self-pay | Admitting: Urology

## 2024-03-04 ENCOUNTER — Ambulatory Visit: Admitting: Urology

## 2024-03-04 VITALS — BP 146/90 | HR 97

## 2024-03-04 DIAGNOSIS — N136 Pyonephrosis: Secondary | ICD-10-CM

## 2024-03-04 DIAGNOSIS — R3129 Other microscopic hematuria: Secondary | ICD-10-CM

## 2024-03-04 LAB — URINALYSIS, ROUTINE W REFLEX MICROSCOPIC
Glucose, UA: NEGATIVE
Ketones, UA: NEGATIVE
Nitrite, UA: NEGATIVE
Specific Gravity, UA: 1.025 (ref 1.005–1.030)
Urobilinogen, Ur: 0.2 mg/dL (ref 0.2–1.0)
pH, UA: 6 (ref 5.0–7.5)

## 2024-03-04 LAB — MICROSCOPIC EXAMINATION: RBC, Urine: >30 /HPF — AB (ref 0–2)

## 2024-03-04 NOTE — Progress Notes (Signed)
 "  Assessment: 1. Pyonephrosis. left   2. Microscopic hematuria     Plan: Request recent lab results from PCP Urine culture sent today Will contact her with results If no evidence of UTI, may need further evaluation with CT hematuria study Return to office in 6 weeks  Chief Complaint: Chief Complaint  Patient presents with   Pyonephrosis    HPI: Hailey Vance is a 59 y.o. female who presents for continued evaluation of left pyelonephrosis. She was seen in consultation at [facility] on 09/29/2023 for evaluation of left hydronephrosis.  She presented to the emergency room with a 3-day history of left flank pain.  She also had dysuria.  No gross hematuria.  She was found to have leukocytosis with a white count of 17.2 K.  Urinalysis was nitrite positive with >50 WBCs, many bacteria.  Creatinine elevated at 2.04.  CT renal stone study showed left hydronephrosis with air in the left collecting system consistent with pyelonephrosis, left renal cyst, and left renal calculi.  She was started on IV Rocephin .  She was taken to the operating room on 09/29/2023 for cystoscopy, left retrograde pyelogram, and insertion of left ureteral stent.  She was found to have purulent material draining from the left UO with dilation of the left renal pelvis and calyces and purulent drainage from the left kidney following stent placement. Urine culture grew >100 K E. coli.  Blood cultures were negative. Her creatinine improved to 1.02. She was discharged with 2 weeks of cefadroxil .  She presented to the office on 10/21/23 for possible cystoscopy and stent removal. She had some stent related discomfort.  No recent gross hematuria.  She reported some left-sided flank pain.  She was having some itching and diarrhea associated with the cefadroxil  so she was taking 500 mg twice daily.  No fever or chills. Due to an abnormal urinalysis suggesting possible UTI, stent removal was postponed. [facility] urine culture  grew E. Coli and Candida.   She was given a 7-day course of Cipro .  Her stent was removed on 11/04/2023.  She had completed the Cipro .  She was not having any fevers or chills.    She did well following removal of the stent.  Two days prior to her visit in October 2025, she noted very cloudy urine.  No dysuria or gross hematuria.  She experienced some low back pain and right lower quadrant discomfort.  No fevers or chills.  No flank pain.  She was not having any symptoms of a vaginal yeast infection. Urine culture from 10/25:  10-25K mixed flora. Cr 1.09.  She returns today for follow-up.  He was treated for a UTI in December 2025 by her PCP.  She was not having any symptoms at that time.  She reported that her urinalysis was abnormal.  She completed antibiotics approximately 10 days ago.  She is not having any symptoms at the present time.  No dysuria or gross hematuria.  No flank pain.  She does have some frequency and urgency.  Portions of the above documentation were copied from a prior visit for review purposes only.  Allergies: Allergies  Allergen Reactions   Penicillins Itching and Other (See Comments)    Has patient had a PCN reaction causing immediate rash, facial/tongue/throat swelling, SOB or lightheadedness with hypotension: No Has patient had a PCN reaction causing severe rash involving mucus membranes or skin necrosis: No Has patient had a PCN reaction that required hospitalization No Has patient had a  PCN reaction occurring within the last 10 years: No If all of the above answers are NO, then may proceed with Cephalosporin use.     PMH: Past Medical History:  Diagnosis Date   Bilateral primary osteoarthritis of knee 03/29/2019   Chronic back pain    Cystocele with rectocele    Generalized anxiety disorder    GERD (gastroesophageal reflux disease)    Headache    otc med prn   Heart murmur    echo in epic 11-26-2017  normal w/ ef 55-60%   History of chest pain    pt  referred for atyical chest pain , cardiology evaluation w/ dr berry note in epic 10-25-2014, stated with low risk w/ no ischemia , nuclear ef 70% , done 09-19-2014 in epic,  non-cardiac chest pain   HTN (hypertension)    followed by pcp    (normal nuclear stress test 09-19-2014 in epic)   Low blood potassium    Major depressive disorder    OSA (obstructive sleep apnea)    04/2015 study in e- Severe OSA (AHI = 55.0/hour);   (10-06-2019  per pt has not used cpap since 2019 after gastric bypass)   Osteoarthritis of both hips 06/15/2019   Plantar fasciitis of left foot    S/P gastric bypass 02/15/2018   sleeve   SUI (stress urinary incontinence, female)    Vitamin D  deficiency 09/06/2014   Wears glasses     PSH: Past Surgical History:  Procedure Laterality Date   BLADDER SUSPENSION N/A 10/12/2019   Procedure: TRANSVAGINAL TAPE (TVT) PROCEDURE;  Surgeon: Sarrah Browning, MD;  Location: [facility];  Service: Gynecology;  Laterality: N/A;   CESAREAN SECTION W/BTL  1990   COLONOSCOPY WITH PROPOFOL  N/A 08/30/2015   Procedure: COLONOSCOPY WITH PROPOFOL ;  Surgeon: Gustav LULLA Mcgee, MD;  Location: [facility];  Service: Endoscopy;  Laterality: N/A;   CYSTOCELE REPAIR N/A 10/12/2019   Procedure: ANTERIOR (CYSTOCELE);  Surgeon: Sarrah Browning, MD;  Location: [facility];  Service: Gynecology;  Laterality: N/A;   CYSTOSCOPY N/A 05/23/2015   Procedure: CYSTOSCOPY;  Surgeon: Browning Sarrah, MD;  Location: [facility];  Service: Gynecology;  Laterality: N/A;   CYSTOSCOPY N/A 10/12/2019   Procedure: CYSTOSCOPY;  Surgeon: Sarrah Browning, MD;  Location: [facility];  Service: Gynecology;  Laterality: N/A;   CYSTOSCOPY WITH RETROGRADE PYELOGRAM, URETEROSCOPY AND STENT PLACEMENT Left 09/29/2023   Procedure: CYSTOSCOPY WITH RETROGRADE PYELOGRAM AND STENT INSERTION;  Surgeon: Roseann Adine PARAS., MD;  Location: [facility];  Service: Urology;  Laterality: Left;    HIATAL HERNIA REPAIR N/A 02/15/2018   Procedure: HERNIA REPAIR HIATAL;  Surgeon: Mikell Katz, MD;  Location: [facility];  Service: General;  Laterality: N/A;   HYSTEROSCOPY WITH D & C N/A 01/11/2015   Procedure: DILATATION AND CURETTAGE /HYSTEROSCOPY;  Surgeon: Jolene Gaskins, MD;  Location: [facility];  Service: Gynecology;  Laterality: N/A;   KNEE ARTHROSCOPY Right 2010   LAPAROSCOPIC GASTRIC SLEEVE RESECTION N/A 02/15/2018   Procedure: LAPAROSCOPIC GASTRIC SLEEVE RESECTION WITH UPPER ENDO AND ERAS PATHWAY;  Surgeon: Mikell Katz, MD;  Location: [facility];  Service: General;  Laterality: N/A;   LAPAROSCOPIC OOPHORECTOMY Bilateral 2004   LEFT OOPHORECTOMY AND PARTIAL RIGHT OOPHORECTOMY   LIPOMA EXCISION Left 12/02/2016   Procedure: EXCISION LIPOMA OF LEFT SHOULDER;  Surgeon: Anderson Maude ORN, MD;  Location: [facility];  Service: Orthopedics;  Laterality: Left;   ROBOTIC ASSISTED TOTAL HYSTERECTOMY WITH SALPINGECTOMY Bilateral 05/23/2015   Procedure: ROBOTIC ASSISTED  TOTAL HYSTERECTOMY WITH SALPINGECTOMYwith right oophorectomy;  Surgeon: Rosaline Luna, MD;  Location: [facility];  Service: Gynecology;  Laterality: Bilateral;   UMBILICAL HERNIA REPAIR  2002    SH: Social History   Tobacco Use   Smoking status: Never   Smokeless tobacco: Never  Vaping Use   Vaping status: Never Used  Substance Use Topics   Alcohol use: No    Alcohol/week: 0.0 standard drinks of alcohol   Drug use: Never    ROS: Constitutional:  Negative for fever, chills, weight loss CV: Negative for chest pain, previous MI, hypertension Respiratory:  Negative for shortness of breath, wheezing, sleep apnea, frequent cough GI:  Negative for nausea, vomiting, bloody stool, GERD  PE: BP (!) 146/90   Pulse 97   LMP 10/02/2007  GENERAL APPEARANCE:  Well appearing, well developed, well nourished, NAD HEENT:  Atraumatic, normocephalic, oropharynx clear NECK:  Supple without lymphadenopathy or thyromegaly ABDOMEN:  Soft,  non-tender, no masses EXTREMITIES:  Moves all extremities well, without clubbing, cyanosis, or edema NEUROLOGIC:  Alert and oriented x 3, normal gait, CN II-XII grossly intact MENTAL STATUS:  appropriate BACK:  Non-tender to palpation, No CVAT SKIN:  Warm, dry, and intact   Results: U/A: 6-10 WBCs, >30 RBCs, moderate bacteria "

## 2024-03-06 ENCOUNTER — Ambulatory Visit: Admission: RE | Admit: 2024-03-06 | Discharge: 2024-03-06 | Disposition: A | Source: Ambulatory Visit

## 2024-03-06 DIAGNOSIS — M5412 Radiculopathy, cervical region: Secondary | ICD-10-CM

## 2024-03-06 DIAGNOSIS — M5416 Radiculopathy, lumbar region: Secondary | ICD-10-CM

## 2024-03-07 ENCOUNTER — Ambulatory Visit: Payer: Self-pay | Admitting: Urology

## 2024-03-07 DIAGNOSIS — R8290 Unspecified abnormal findings in urine: Secondary | ICD-10-CM

## 2024-03-07 LAB — URINE CULTURE

## 2024-03-07 MED ORDER — NITROFURANTOIN MONOHYD MACRO 100 MG PO CAPS: 100.0000 mg | ORAL_CAPSULE | Freq: Two times a day (BID) | ORAL | 0 refills | Status: AC

## 2024-03-13 ENCOUNTER — Ambulatory Visit
Admission: RE | Admit: 2024-03-13 | Discharge: 2024-03-13 | Disposition: A | Source: Ambulatory Visit | Attending: Orthopaedic Surgery | Admitting: Orthopaedic Surgery

## 2024-03-13 DIAGNOSIS — G8929 Other chronic pain: Secondary | ICD-10-CM

## 2024-03-14 ENCOUNTER — Ambulatory Visit: Payer: Self-pay | Admitting: Orthopaedic Surgery

## 2024-03-14 NOTE — Progress Notes (Signed)
 Needs f/u appt

## 2024-03-29 ENCOUNTER — Ambulatory Visit: Admitting: Orthopaedic Surgery

## 2024-04-12 ENCOUNTER — Ambulatory Visit: Admitting: Orthopaedic Surgery

## 2024-04-20 ENCOUNTER — Ambulatory Visit: Admitting: Urology

## 2024-04-29 ENCOUNTER — Ambulatory Visit: Admitting: Urology
